# Patient Record
Sex: Male | Born: 1960 | Race: Black or African American | Hispanic: No | Smoking: Former smoker
Health system: Southern US, Community
[De-identification: ages and names within clinical notes are randomized; demographics above are authoritative.]

## PROBLEM LIST (undated history)

## (undated) DIAGNOSIS — C61 Malignant neoplasm of prostate: Secondary | ICD-10-CM

## (undated) DIAGNOSIS — K859 Acute pancreatitis without necrosis or infection, unspecified: Secondary | ICD-10-CM

## (undated) DIAGNOSIS — C801 Malignant (primary) neoplasm, unspecified: Secondary | ICD-10-CM

## (undated) DIAGNOSIS — R945 Abnormal results of liver function studies: Secondary | ICD-10-CM

## (undated) DIAGNOSIS — K869 Disease of pancreas, unspecified: Secondary | ICD-10-CM

## (undated) DIAGNOSIS — M549 Dorsalgia, unspecified: Secondary | ICD-10-CM

## (undated) DIAGNOSIS — R7989 Other specified abnormal findings of blood chemistry: Secondary | ICD-10-CM

## (undated) DIAGNOSIS — I10 Essential (primary) hypertension: Secondary | ICD-10-CM

## (undated) DIAGNOSIS — K7460 Unspecified cirrhosis of liver: Secondary | ICD-10-CM

## (undated) DIAGNOSIS — G8929 Other chronic pain: Secondary | ICD-10-CM

## (undated) DIAGNOSIS — M25569 Pain in unspecified knee: Secondary | ICD-10-CM

## (undated) DIAGNOSIS — K760 Fatty (change of) liver, not elsewhere classified: Secondary | ICD-10-CM

## (undated) DIAGNOSIS — K449 Diaphragmatic hernia without obstruction or gangrene: Secondary | ICD-10-CM

## (undated) HISTORY — DX: Acute pancreatitis without necrosis or infection, unspecified: K85.90

## (undated) HISTORY — DX: Abnormal results of liver function studies: R94.5

## (undated) HISTORY — PX: KNEE SURGERY: SHX244

## (undated) HISTORY — DX: Other specified abnormal findings of blood chemistry: R79.89

## (undated) HISTORY — PX: PROSTATE BIOPSY: SHX241

## (undated) HISTORY — DX: Fatty (change of) liver, not elsewhere classified: K76.0

## (undated) HISTORY — DX: Disease of pancreas, unspecified: K86.9

## (undated) HISTORY — DX: Diaphragmatic hernia without obstruction or gangrene: K44.9

## (undated) HISTORY — DX: Unspecified cirrhosis of liver: K74.60

---

## 2001-02-13 ENCOUNTER — Emergency Department (HOSPITAL_COMMUNITY): Admission: EM | Admit: 2001-02-13 | Discharge: 2001-02-14 | Payer: Self-pay | Admitting: Emergency Medicine

## 2001-02-14 ENCOUNTER — Encounter: Payer: Self-pay | Admitting: Emergency Medicine

## 2001-02-21 ENCOUNTER — Encounter: Admission: RE | Admit: 2001-02-21 | Discharge: 2001-02-21 | Payer: Self-pay | Admitting: Nephrology

## 2001-02-21 ENCOUNTER — Encounter: Payer: Self-pay | Admitting: Nephrology

## 2001-09-04 ENCOUNTER — Encounter: Payer: Self-pay | Admitting: Emergency Medicine

## 2001-09-04 ENCOUNTER — Emergency Department (HOSPITAL_COMMUNITY): Admission: EM | Admit: 2001-09-04 | Discharge: 2001-09-04 | Payer: Self-pay | Admitting: Emergency Medicine

## 2001-12-18 ENCOUNTER — Ambulatory Visit (HOSPITAL_COMMUNITY): Admission: RE | Admit: 2001-12-18 | Discharge: 2001-12-18 | Payer: Self-pay | Admitting: Family Medicine

## 2002-09-27 ENCOUNTER — Ambulatory Visit (HOSPITAL_COMMUNITY): Admission: RE | Admit: 2002-09-27 | Discharge: 2002-09-27 | Payer: Self-pay | Admitting: Cardiovascular Disease

## 2002-09-27 ENCOUNTER — Encounter: Payer: Self-pay | Admitting: Cardiovascular Disease

## 2003-01-16 ENCOUNTER — Ambulatory Visit (HOSPITAL_COMMUNITY): Admission: RE | Admit: 2003-01-16 | Discharge: 2003-01-16 | Payer: Self-pay | Admitting: Family Medicine

## 2003-07-17 ENCOUNTER — Emergency Department (HOSPITAL_COMMUNITY): Admission: EM | Admit: 2003-07-17 | Discharge: 2003-07-17 | Payer: Self-pay | Admitting: Emergency Medicine

## 2003-07-23 ENCOUNTER — Emergency Department (HOSPITAL_COMMUNITY): Admission: AD | Admit: 2003-07-23 | Discharge: 2003-07-23 | Payer: Self-pay | Admitting: Family Medicine

## 2004-06-15 ENCOUNTER — Ambulatory Visit (HOSPITAL_COMMUNITY): Admission: RE | Admit: 2004-06-15 | Discharge: 2004-06-15 | Payer: Self-pay | Admitting: Family Medicine

## 2004-07-06 ENCOUNTER — Ambulatory Visit (HOSPITAL_COMMUNITY): Admission: RE | Admit: 2004-07-06 | Discharge: 2004-07-06 | Payer: Self-pay | Admitting: Family Medicine

## 2004-08-03 ENCOUNTER — Ambulatory Visit: Payer: Self-pay | Admitting: Psychiatry

## 2004-08-03 ENCOUNTER — Inpatient Hospital Stay (HOSPITAL_COMMUNITY): Admission: RE | Admit: 2004-08-03 | Discharge: 2004-08-07 | Payer: Self-pay | Admitting: Psychiatry

## 2004-08-04 ENCOUNTER — Encounter (HOSPITAL_COMMUNITY): Payer: Self-pay | Admitting: Psychiatry

## 2004-09-22 ENCOUNTER — Ambulatory Visit: Payer: Self-pay | Admitting: Internal Medicine

## 2004-09-28 ENCOUNTER — Ambulatory Visit (HOSPITAL_COMMUNITY): Admission: RE | Admit: 2004-09-28 | Discharge: 2004-09-28 | Payer: Self-pay | Admitting: *Deleted

## 2004-11-09 ENCOUNTER — Ambulatory Visit: Payer: Self-pay | Admitting: Internal Medicine

## 2005-02-17 ENCOUNTER — Ambulatory Visit: Payer: Self-pay | Admitting: Internal Medicine

## 2005-04-15 ENCOUNTER — Ambulatory Visit: Payer: Self-pay | Admitting: Internal Medicine

## 2005-06-03 ENCOUNTER — Ambulatory Visit: Payer: Self-pay | Admitting: Internal Medicine

## 2009-12-16 ENCOUNTER — Emergency Department (HOSPITAL_COMMUNITY): Admission: EM | Admit: 2009-12-16 | Discharge: 2009-12-16 | Payer: Self-pay | Admitting: Emergency Medicine

## 2010-06-28 ENCOUNTER — Ambulatory Visit: Payer: Self-pay | Admitting: Family Medicine

## 2010-06-28 ENCOUNTER — Telehealth: Payer: Self-pay | Admitting: Family Medicine

## 2010-06-28 ENCOUNTER — Encounter: Payer: Self-pay | Admitting: Family Medicine

## 2010-06-28 DIAGNOSIS — M109 Gout, unspecified: Secondary | ICD-10-CM | POA: Insufficient documentation

## 2010-06-28 DIAGNOSIS — I10 Essential (primary) hypertension: Secondary | ICD-10-CM | POA: Insufficient documentation

## 2010-06-29 ENCOUNTER — Ambulatory Visit (HOSPITAL_COMMUNITY): Admission: RE | Admit: 2010-06-29 | Discharge: 2010-06-29 | Payer: Self-pay | Admitting: Family Medicine

## 2010-06-29 ENCOUNTER — Encounter: Payer: Self-pay | Admitting: Family Medicine

## 2010-06-29 ENCOUNTER — Ambulatory Visit: Payer: Self-pay | Admitting: Family Medicine

## 2010-06-29 DIAGNOSIS — E875 Hyperkalemia: Secondary | ICD-10-CM | POA: Insufficient documentation

## 2010-06-29 LAB — CONVERTED CEMR LAB
AST: 66 units/L — ABNORMAL HIGH (ref 0–37)
Albumin: 4.8 g/dL (ref 3.5–5.2)
Alkaline Phosphatase: 85 units/L (ref 39–117)
BUN: 10 mg/dL (ref 6–23)
Creatinine, Ser: 0.97 mg/dL (ref 0.40–1.50)
Potassium: 5.4 meq/L — ABNORMAL HIGH (ref 3.5–5.3)

## 2010-07-02 ENCOUNTER — Ambulatory Visit: Payer: Self-pay | Admitting: Family Medicine

## 2010-07-02 ENCOUNTER — Encounter: Payer: Self-pay | Admitting: Family Medicine

## 2010-07-02 LAB — CONVERTED CEMR LAB
BUN: 15 mg/dL (ref 6–23)
CO2: 21 meq/L (ref 19–32)
Chloride: 97 meq/L (ref 96–112)
Creatinine, Ser: 1 mg/dL (ref 0.40–1.50)
Glucose, Bld: 130 mg/dL — ABNORMAL HIGH (ref 70–99)

## 2010-07-07 ENCOUNTER — Ambulatory Visit: Payer: Self-pay | Admitting: Family Medicine

## 2010-07-07 ENCOUNTER — Encounter: Payer: Self-pay | Admitting: Family Medicine

## 2010-07-07 DIAGNOSIS — I4949 Other premature depolarization: Secondary | ICD-10-CM | POA: Insufficient documentation

## 2010-07-07 LAB — CONVERTED CEMR LAB
BUN: 15 mg/dL (ref 6–23)
Calcium: 10.4 mg/dL (ref 8.4–10.5)
Cholesterol: 179 mg/dL (ref 0–200)
Glucose, Bld: 78 mg/dL (ref 70–99)

## 2010-09-01 ENCOUNTER — Ambulatory Visit: Payer: Self-pay

## 2010-09-17 ENCOUNTER — Ambulatory Visit (HOSPITAL_COMMUNITY)
Admission: RE | Admit: 2010-09-17 | Discharge: 2010-09-17 | Payer: Self-pay | Source: Home / Self Care | Admitting: Family Medicine

## 2010-09-17 ENCOUNTER — Encounter: Payer: Self-pay | Admitting: Family Medicine

## 2010-09-17 ENCOUNTER — Other Ambulatory Visit: Payer: Self-pay

## 2010-09-17 ENCOUNTER — Ambulatory Visit: Admission: RE | Admit: 2010-09-17 | Discharge: 2010-09-17 | Payer: Self-pay | Source: Home / Self Care

## 2010-09-17 LAB — CONVERTED CEMR LAB
BUN: 8 mg/dL (ref 6–23)
Chloride: 99 meq/L (ref 96–112)
Glucose, Bld: 101 mg/dL — ABNORMAL HIGH (ref 70–99)
Potassium: 4 meq/L (ref 3.5–5.3)

## 2010-09-20 ENCOUNTER — Ambulatory Visit: Admit: 2010-09-20 | Payer: Self-pay

## 2010-10-05 NOTE — Assessment & Plan Note (Signed)
Summary: New Patient Visit: Hypertension and Gout   Vital Signs:  Patient profile:   50 year old male Height:      65 inches Weight:      128.5 pounds BMI:     21.46 Pulse rate:   90 / minute BP sitting:   190 / 120  (left arm) Cuff size:   regular  Vitals Entered By: Arlyss Repress CMA, (June 28, 2010 8:39 AM) CC: right big toe pain...every week. did not take HTN meds x 1 year, Hypertension Management Is Patient Diabetic? No Pain Assessment Patient in pain? yes     Location: right big toe Onset of pain  x 1 week   Primary Care Provider:  Doree Albee MD  CC:  right big toe pain...every week. did not take HTN meds x 1 year and Hypertension Management.  History of Present Illness: 37 YOM w/ PMHx/o gout hand hypertension here for new patient visit.  Gout flare: Pt reports gout flare for last 3-4 days. Most recent attack was approx 3 months ago. Has not been allopurinol in the past.  Rates pain 6/10. Usually affects R big toe. Has not had flares in other joints per pt.   Hypertension History:      He complains of headache, but denies chest pain, palpitations, dyspnea with exertion, orthopnea, PND, peripheral edema, visual symptoms, and syncope.  Further comments include: Was previously diagnosed with hypertension while in prison. Was on 2 medications-however unsure of whihc medications he was taking. has been on medication >1 year since getting out of prison. Marland Kitchen        Positive major cardiovascular risk factors include male age 24 years old or older, hypertension, and current tobacco user.     Habits & Providers  Alcohol-Tobacco-Diet     Tobacco Status: current     Tobacco Counseling: to quit use of tobacco products     Cigarette Packs/Day: 0.5  Medications Prior to Update: 1)  None  Current Medications (verified): 1)  Hydrochlorothiazide 25 Mg Tabs (Hydrochlorothiazide) .... Take 1 Tablet Daily 2)  Norvasc 10 Mg Tabs (Amlodipine Besylate) .... Take 1 Tablet Daily 3)   Colcrys 0.6 Mg Tabs (Colchicine) .... Take 2 Tabs Once, Then 1 Tablet 1 Hour Later; Then Take 1 Tablet Daily Therafter 4)  Prednisone 20 Mg Tabs (Prednisone) .... Take 2 Tablets Daily For 5 Days  Family History: Family History Diabetes 1st degree relative Family History Hypertension  Social History: Pt lives alone Currently unemployed, on disability Was previously in prison. Released in 2009. Drinks 2 beers per day Smokes approx 1/2 ppd  No recreational drug use Smoking Status:  current Packs/Day:  0.5  Physical Exam  General:  alert and well-developed.   Head:  normocephalic and atraumatic.   Eyes:  vision grossly intact, pupils equal, pupils round, and pupils reactive to light.  No abnormalities on funduscopic exam  Ears:  TMs clear bilaterally  Nose:  no external deformity.   Mouth:  Oral mucosa and oropharynx without lesions or exudates.   Neck:  supple and full ROM, No JVD  Chest Wall:  no deformities.   Lungs:  CTAB, no wheezes, rales, rhoncii Heart:  RRR, no rubs, gallops, murmurs Abdomen:  soft, non-tender, normal bowel sounds, and no distention.   Extremities:  2+ peripheral pulses, no edema Neurologic:  alert & oriented X3 and cranial nerves II-XII intact.   Psych:  Oriented X3.     Impression & Recommendations:  Problem # 1:  HYPERTENSION,  BENIGN ESSENTIAL (ICD-401.1) Plan to treat with norvasc and HCTZ. Will hold on starting ACEi given SBP and concern for renal impairment. Pt instructed to followup in 1-2 days for followup BP check. Currently asymptomatic. Willl followup in 1-2 weeks pending [place]/Medicare set up as pt curently uninisured.  His updated medication list for this problem includes:    Hydrochlorothiazide 25 Mg Tabs (Hydrochlorothiazide) .Marland Kitchen... Take 1 tablet daily    Norvasc 10 Mg Tabs (Amlodipine besylate) .Marland Kitchen... Take 1 tablet daily  Orders: Comp Met-[facility] ([id number]) [facility]- Est Level  3 (51884)  Problem # 2:  ACUTE GOUTY ARTHROPATHY  (ICD-274.01) Plan to treat with colchincine and prednisone. Wil hold on high dose NSAIDs secondary to elevated BPs and concern for renal impairment.  Will likely transition to allopurinol and obtain baseline uric acid level once acute gout flare resolved.  His updated medication list for this problem includes:    Colcrys 0.6 Mg Tabs (Colchicine) .Marland Kitchen... Take 2 tabs once, then 1 tablet 1 hour later; then take 1 tablet daily therafter  Orders: [facility]- Est Level  3 ([address]606)  Complete Medication List: 1)  Hydrochlorothiazide 25 Mg Tabs (Hydrochlorothiazide) .... Take 1 tablet daily 2)  Norvasc 10 Mg Tabs (Amlodipine besylate) .... Take 1 tablet daily 3)  Colcrys 0.6 Mg Tabs (Colchicine) .... Take 2 tabs once, then 1 tablet 1 hour later; then take 1 tablet daily therafter 4)  Prednisone 20 Mg Tabs (Prednisone) .... Take 2 tablets daily for 5 days  Hypertension Assessment/Plan:      The patient's hypertensive risk group is category B: At least one risk factor (excluding diabetes) with no target organ damage.  Today's blood pressure is 190/120.  His blood pressure goal is < 140/90.  Patient Instructions: 1)  It was good to meet you today  2)  We are going to check some labs because of your blood pressure 3)  I will be prescribing you some medications to bring your blood pressure down (Norvasc and HCTZ) 4)  I will also  prescribe you some medication for your gout 5)  Come back later this week for a blood pressure check  6)  Come back to see as soon as  your insurance is set up with either Jaynee Eagles or Medicare 7)  IIf any other questions, please, give [place] a call 8)  God Bless 9)  Doree Albee MD  Prescriptions: PREDNISONE 20 MG TABS (PREDNISONE) take 2 tablets daily for 5 days  #10 x 0   Entered and Authorized by:   Doree Albee MD   Signed by:   Doree Albee MD on 06/28/2010   Method used:   Print then Give to Patient   RxID:   [id number] COLCRYS 0.6 MG TABS (COLCHICINE) take 2 tabs  once, then 1 tablet 1 hour later; Then take 1 tablet daily therafter  #33 x 1   Entered and Authorized by:   Doree Albee MD   Signed by:   Doree Albee MD on 06/28/2010   Method used:   Print then Give to Patient   RxID:   [id number] NORVASC 10 MG TABS (AMLODIPINE BESYLATE) take 1 tablet daily  #30 x 0   Entered and Authorized by:   Doree Albee MD   Signed by:   Doree Albee MD on 06/28/2010   Method used:   Print then Give to Patient   RxID:   [id number] HYDROCHLOROTHIAZIDE 25 MG TABS (HYDROCHLOROTHIAZIDE) take 1 tablet daily  #30 x 1   Entered  and Authorized by:   Doree Albee MD   Signed by:   Doree Albee MD on 06/28/2010   Method used:   Print then Give to Patient   RxID:   [id number]    Orders Added: 1)  Comp Met-[facility] [[id number]] 2)  [facility]- Est Level  3 [32355]

## 2010-10-05 NOTE — Assessment & Plan Note (Signed)
Summary: Hypertension and Gout Followup   Vital Signs:  Patient profile:   50 year old male Weight:      125.5 pounds BMI:     20.96 Temp:     98.1 degrees F Pulse rate:   91 / minute BP sitting:   132 / 84  (left arm)  Vitals Entered By: Starleen Blue RN (July 07, 2010 8:58 AM) CC: fu bp, Hypertension Management Is Patient Diabetic? No Pain Assessment Patient in pain? no        Primary Care Provider:  Doree Albee MD  CC:  fu bp and Hypertension Management.  History of Present Illness: Gout: R great toe pain resolved per pt. ROM and overall function now at baseline. Tolerated prednisone well without gastritis, or other systemtic sxs.   Hypertension History:      He complains of headache, but denies chest pain, palpitations, dyspnea with exertion, orthopnea, peripheral edema, and visual symptoms.        Positive major cardiovascular risk factors include male age 75 years old or older, hypertension, and current tobacco user.     Habits & Providers  Alcohol-Tobacco-Diet     Tobacco Status: current     Cigarette Packs/Day: 0.5  Allergies (verified): 1)  ! Pcn  Physical Exam  General:  alert and well-developed.   Head:  normocephalic and atraumatic.   Eyes:  vision grossly intact.   Neck:  supple and full ROM, No JVD  Lungs:  CTAB, no wheezes, rales, rhoncii Heart:  RRR, no rubs, gallops, murmurs Abdomen:  soft, non-tender, normal bowel sounds, and no distention.   Msk:  R great toe: non-erythematous, no tenderness to palpation  Extremities:  2+ peripheral pulses, no edema   Impression & Recommendations:  Problem # 1:  ACUTE GOUTY ARTHROPATHY (ICD-274.01) Assessment Improved Now resolved. Will start pt on allopurinol for chronic management.  His updated medication list for this problem includes:    Colcrys 0.6 Mg Tabs (Colchicine) .Marland Kitchen... Take 2 tabs once, then 1 tablet 1 hour later; then take 1 tablet daily therafter    Allopurinol 100 Mg Tabs  (Allopurinol) .Marland Kitchen... 1 tablet daily for 7 days; then 1 tablet twice daily thereafter  Orders: [facility]- Est Level  3 ([address])  Problem # 2:  HYPERTENSION, BENIGN ESSENTIAL (ICD-401.1) Assessment: Improved BPs now at goal. Will transition pt to losartan from HCTZ for uricosuric properties in setting of underlying gout. Will also start on  low dose beta  blocker secondary to noted PVCs on most recent EKG. Discussed at length with pt hypotensive red flags. Pt instructed for repeat BP check in  1 week.   The following medications were removed from the medication list:    Hydrochlorothiazide 25 Mg Tabs (Hydrochlorothiazide) .Marland Kitchen... Take 1 tablet daily His updated medication list for this problem includes:    Norvasc 10 Mg Tabs (Amlodipine besylate) .Marland Kitchen... Take 1 tablet daily    Losartan Potassium 25 Mg Tabs (Losartan potassium) .Marland Kitchen... Take 2 tablets daily    Coreg 6.25 Mg Tabs (Carvedilol) .Marland Kitchen... Take 1/2 tablet daily  Orders: T-Lipid Profile ([id number]) [facility]- Est Level  3 (78469)  Problem # 3:  PREMATURE VENTRICULAR CONTRACTIONS (ICD-427.69) Currently asympromatic. WIll start on low dose beta blocker for treatment.  His updated medication list for this problem includes:    Coreg 6.25 Mg Tabs (Carvedilol) .Marland Kitchen... Take 1/2 tablet daily  Problem # 4:  HYPERKALEMIA (ICD-276.7) Asymptomatic. Will check BMET for resolution.  Orders: Basic Met-[facility] ([phone]) [facility]- Est Level  3 (04540)  Complete Medication List: 1)  Norvasc 10 Mg Tabs (Amlodipine besylate) .... Take 1 tablet daily 2)  Colcrys 0.6 Mg Tabs (Colchicine) .... Take 2 tabs once, then 1 tablet 1 hour later; then take 1 tablet daily therafter 3)  Losartan Potassium 25 Mg Tabs (Losartan potassium) .... Take 2 tablets daily 4)  Allopurinol 100 Mg Tabs (Allopurinol) .Marland Kitchen.. 1 tablet daily for 7 days; then 1 tablet twice daily thereafter 5)  Coreg 6.25 Mg Tabs (Carvedilol) .... Take 1/2 tablet daily  Other Orders: Misc. Referral (Misc.  Ref)  Hypertension Assessment/Plan:      The patient's hypertensive risk group is category B: At least one risk factor (excluding diabetes) with no target organ damage.  Today's blood pressure is 132/84.  His blood pressure goal is < 140/90.  Patient Instructions: 1)  It was good to see you today 2)  Your blood pressure and gout are doing much better 3)  I will change some of your blood pressure medications to help with your gout 4)  STOP taking the HCTZ and colchicine 5)  START losartan and allopurinol 6)  I will also recheck some labs today  7)  Come back in 1 week for blood pressure check  8)  Otherwise, come back to see me in 2-4 weeks.  9)  Call with any questions 10)  God Bless 11)  Doree Albee MD Prescriptions: COREG 6.25 MG TABS (CARVEDILOL) take 1/2 tablet daily  #30 x 6   Entered and Authorized by:   Doree Albee MD   Signed by:   Doree Albee MD on 07/07/2010   Method used:   Electronically to        [facility] [address] [phone]* (retail)       [address]       [place], [place]  [address]       Ph: [phone]       Fax: [phone]   RxID:   [id number] ALLOPURINOL 100 MG TABS (ALLOPURINOL) 1 tablet daily for 7 days; then 1 tablet twice daily thereafter  #60 x 3   Entered and Authorized by:   Doree Albee MD   Signed by:   Doree Albee MD on 07/07/2010   Method used:   Electronically to        [facility] [address] [phone]* (retail)       [address]       [place], [place]  [address]       Ph: [phone]       Fax: [phone]   RxID:   [id number] DDUKGURK POTASSIUM 25 MG TABS (LOSARTAN POTASSIUM) take 2 tablets daily  #60 x 6   Entered and Authorized by:   Doree Albee MD   Signed by:   Doree Albee MD on 07/07/2010   Method used:   Electronically to        [facility] [address] [phone]* (retail)       [address]       [place], [place]  [address]       Ph: [phone]       Fax: [phone]   RxID:   [id number]    Orders  Added: 1)  Basic Met-[facility] [[id number]] 2)  Misc. Referral [Misc. Ref] 3)  T-Lipid Profile [80061-22930] 4)  [facility]- Est Level  3 [93810]     Prevention & Chronic Care Immunizations   Influenza vaccine: Not documented    Tetanus booster: Not documented  Td booster deferral: Deferred  (07/07/2010)   Tetanus booster due: 08/23/2011    Pneumococcal vaccine: Not documented  Colorectal Screening   Hemoccult: Not documented    Colonoscopy: Not documented  Other Screening   PSA: Not documented   Smoking status: current  (07/07/2010)   Smoking cessation counseling: yes  (07/07/2010)  Lipids   Total Cholesterol: Not documented   Lipid panel action/deferral: Lipid Panel ordered   LDL: Not documented   LDL Direct: Not documented   HDL: Not documented   Triglycerides: Not documented  Hypertension   Last Blood Pressure: 132 / 84  (07/07/2010)   Serum creatinine: 1.00  (07/02/2010)   Serum potassium 4.9  (07/02/2010)    Hypertension flowsheet reviewed?: Yes   Progress toward BP goal: At goal  Self-Management Support :   Personal Goals (by the next clinic visit) :      Personal blood pressure goal: 140/90  (07/07/2010)   Patient will work on the following items until the next clinic visit to reach self-care goals:     Medications and monitoring: check my blood pressure, bring all of my medications to every visit  (07/07/2010)     Eating: eat more vegetables, eat foods that are low in salt  (07/07/2010)     Other: attend a smoking cessation class  (07/07/2010)    Hypertension self-management support: BP self-monitoring log, Education handout, Written self-care plan  (07/07/2010)   Hypertension self-care plan printed.   Hypertension education handout printed   Nursing Instructions: Give Flu vaccine today

## 2010-10-05 NOTE — Assessment & Plan Note (Signed)
Summary: labs & bp check/eo  Nurse Visit In for BP check today and labs, BP checked manually with regular adult cuff. BP  RA 140/90, LA 140/94. pulse 88. will forward message to Dr. Alvester Morin.   Theresia Lo RN  July 02, 2010 4:40 PM   Orders Added: 1)  No Charge Patient Arrived (NCPA0) [NCPA0]

## 2010-10-05 NOTE — Assessment & Plan Note (Signed)
Summary: BP CHECK/KH  Nurse Visit  patient in for BP check and EKG as instructed by Dr. Alvester Morin.  BP checked manually with regular adult cuff. BP LA 150/90 , RA 142/90 pulse 100.  patient took amlopidine and HCTZ yesterday and today as directed. EKG done and shown to Dr. Deirdre Priest. he advises for patient to return 07/02/2010 for BMET and BP check again and see Dr. Alvester Morin next week. appointment scheduled for 07/07/2010. Theresia Lo RN  June 29, 2010 2:44 PM   Orders Added: 1)  Basic Met-[facility] [id number] 2)  Uric Acid-[facility] [09811-91478] 3)  No Charge Patient Arrived (NCPA0) [NCPA0]

## 2010-10-05 NOTE — Progress Notes (Signed)
Summary: pls call  Phone Note Call from Patient Call back at [phone]   Caller: Patient Summary of Call: has a question about meds that were given to him Initial call taken by: De Nurse,  June 28, 2010 11:05 AM  Follow-up for Phone Call        Call and spoke to patient about concern of cost of colchicine. Told patient that he may be able to tolerate prednisone only for the acute flare given the cost issue. Will also recommend NSAIDs pending BMET in setting of SBPs>180 on  initial visit (to avoid further renal insult). Pt agreeable to plan. Doree Albee MD June 28, 2010 5:08 PM

## 2010-10-07 ENCOUNTER — Ambulatory Visit: Payer: Self-pay | Admitting: Family Medicine

## 2010-10-07 ENCOUNTER — Ambulatory Visit: Admit: 2010-10-07 | Payer: Self-pay

## 2010-10-07 NOTE — Assessment & Plan Note (Signed)
Summary: f/u eo   Vital Signs:  Patient profile:   50 year old male Height:      65 inches Weight:      131 pounds BMI:     21.88 Temp:     98.1 degrees F oral Pulse rate:   97 / minute BP sitting:   176 / 98  (left arm) Cuff size:   regular  Vitals Entered By: Tessie Fass CMA (September 17, 2010 1:44 PM) CC: F/U BP, Hypertension Management Is Patient Diabetic? No Pain Assessment Patient in pain? no        CC:  F/U BP and Hypertension Management.  Hypertension History:      He complains of headache and chest pain, but denies palpitations, dyspnea with exertion, orthopnea, PND, peripheral edema, and visual symptoms.  Further comments include: CP intermittent not associated with exertion. Currently no CP. Marland Kitchen        Positive major cardiovascular risk factors include male age 29 years old or older, hypertension, and current tobacco user.     Habits & Providers  Alcohol-Tobacco-Diet     Tobacco Status: current     Tobacco Counseling: to quit use of tobacco products     Cigarette Packs/Day: 0.5  Allergies: 1)  ! Pcn  Physical Exam  General:  alert and well-developed.   Head:  normocephalic and atraumatic.   Mouth:  good dentition.   Neck:  supple, full ROM, no JVD Lungs:  CTAB, no wheezes, rales rhoncii  Heart:  RRR, no rubs, gallops Extremities:  2+ peripheral pulses, no edema    Impression & Recommendations:  Problem # 1:  HYPERTENSION, BENIGN ESSENTIAL (ICD-401.1) Assessment Deteriorated Elevated today. EKG in clinic negative for any concerning ST/T wave changes. Will increase coreg. Will switch to losartan for BP and gout management.  WIl lhave pt followup for BP check in 1 week.  His updated medication list for this problem includes:    Coreg 12.5 Mg Tabs (Carvedilol) .Marland Kitchen... 1 tablet twice daily    Cozaar 50 Mg Tabs (Losartan potassium) .Marland Kitchen... 1 tab two times a day  Orders: T-Basic Metabolic Panel [phone]) 12 Lead EKG (12 Lead EKG) [facility]- Est Level  3  ([address]11)  Problem # 2:  ACUTE GOUTY ARTHROPATHY (ICD-274.01)  Pt instructed to restart allopurinol. Will recheck uric acid once on allopurinol.  His updated medication list for this problem includes:    Allopurinol 100 Mg Tabs (Allopurinol) .Marland Kitchen... 1 pill twice daily  Orders: [facility]- Est Level  3 (99213)  Complete Medication List: 1)  Coreg 12.5 Mg Tabs (Carvedilol) .Marland Kitchen.. 1 tablet twice daily 2)  Cozaar 50 Mg Tabs (Losartan potassium) .Marland Kitchen.. 1 tab two times a day 3)  Allopurinol 100 Mg Tabs (Allopurinol) .Marland Kitchen.. 1 pill twice daily 4)  Ra Nicotine 14 Mg/24hr Pt24 (Nicotine) .Marland Kitchen.. 1 patch every 24 hours for 6  Other Orders: Colonoscopy (Colon)  Hypertension Assessment/Plan:      The patient's hypertensive risk group is category B: At least one risk factor (excluding diabetes) with no target organ damage.  His calculated 10 year risk of coronary heart disease is 9 %.  Today's blood pressure is 176/98.  His blood pressure goal is < 140/90.  Patient Instructions: 1)  It was good to see you today 2)  I am increasing your coreg to 12.5 mg twice daily (2 of your 6.25 mg tablets twice a day) 3)  I am also check some blood work today 4)  Come back on 09/20/10 for  a blood presure check  5)  Restart your allopurinol 6)  Come back in 2 weeks for [place] to follow up on your blood pressure 7)  If you develop any sever chest pain, please give [place] a call or go to the Emergency Department 8)  God Bless,  9)  Doree Albee MD  Prescriptions: RA NICOTINE 14 MG/24HR PT24 (NICOTINE) 1 patch every 24 hours for 6  #42 patchs x 0   Entered and Authorized by:   Doree Albee MD   Signed by:   Doree Albee MD on 09/17/2010   Method used:   Print then Give to Patient   RxID:   [id number] ALLOPURINOL 100 MG TABS (ALLOPURINOL) 1 pill twice daily  #60 x 0   Entered and Authorized by:   Doree Albee MD   Signed by:   Doree Albee MD on 09/17/2010   Method used:   Historical   RxID:   [id number] COZAAR 50 MG  TABS (LOSARTAN POTASSIUM) 1 tab two times a day  #60 x 0   Entered and Authorized by:   Doree Albee MD   Signed by:   Doree Albee MD on 09/17/2010   Method used:   Historical   RxID:   [id number] COREG 12.5 MG TABS (CARVEDILOL) 1 tablet twice daily  #60 x 3   Entered and Authorized by:   Doree Albee MD   Signed by:   Doree Albee MD on 09/17/2010   Method used:   Print then Give to Patient   RxID:   [id number]    Orders Added: 1)  Colonoscopy [Colon] 2)  T-Basic Metabolic Panel [[id number]] 3)  12 Lead EKG [12 Lead EKG] 4)  [facility]- Est Level  3 [51884]     Prevention & Chronic Care Immunizations   Influenza vaccine: Not documented    Tetanus booster: Not documented   Td booster deferral: Deferred  (07/07/2010)   Tetanus booster due: 08/23/2011    Pneumococcal vaccine: Not documented  Colorectal Screening   Hemoccult: Not documented    Colonoscopy: Not documented   Colonoscopy action/deferral: GI Referral  (09/17/2010)  Other Screening   PSA: Not documented   Smoking status: current  (09/17/2010)   Smoking cessation counseling: yes  (07/07/2010)  Lipids   Total Cholesterol: 179  (07/07/2010)   Lipid panel action/deferral: Lipid Panel ordered   LDL: 81  (07/07/2010)   LDL Direct: Not documented   HDL: 75  (07/07/2010)   Triglycerides: 117  (07/07/2010)  Hypertension   Last Blood Pressure: 176 / 98  (09/17/2010)   Serum creatinine: 0.94  (07/07/2010)   BMP action: Ordered   Serum potassium 3.9  (07/07/2010)    Hypertension flowsheet reviewed?: Yes   Progress toward BP goal: Deteriorated  Self-Management Support :   Personal Goals (by the next clinic visit) :      Personal blood pressure goal: 140/90  (07/07/2010)   Hypertension self-management support: BP self-monitoring log, Education handout, Written self-care plan  (07/07/2010)   Nursing Instructions: Give Flu vaccine today Screening colonoscopy ordered

## 2010-10-17 ENCOUNTER — Emergency Department (HOSPITAL_COMMUNITY)
Admission: EM | Admit: 2010-10-17 | Discharge: 2010-10-17 | Disposition: A | Discharge status: Home / Self Care | Payer: Medicare Other | Attending: Emergency Medicine | Admitting: Emergency Medicine

## 2010-10-17 DIAGNOSIS — I10 Essential (primary) hypertension: Secondary | ICD-10-CM | POA: Insufficient documentation

## 2010-10-17 DIAGNOSIS — M109 Gout, unspecified: Secondary | ICD-10-CM | POA: Insufficient documentation

## 2010-10-17 DIAGNOSIS — R1110 Vomiting, unspecified: Secondary | ICD-10-CM | POA: Insufficient documentation

## 2010-10-17 DIAGNOSIS — R109 Unspecified abdominal pain: Secondary | ICD-10-CM | POA: Insufficient documentation

## 2010-10-17 DIAGNOSIS — R197 Diarrhea, unspecified: Secondary | ICD-10-CM | POA: Insufficient documentation

## 2010-10-17 LAB — DIFFERENTIAL
Basophils Relative: 0 % (ref 0–1)
Eosinophils Absolute: 0.2 10*3/uL (ref 0.0–0.7)
Lymphs Abs: 1.5 10*3/uL (ref 0.7–4.0)
Monocytes Absolute: 0.3 10*3/uL (ref 0.1–1.0)
Monocytes Relative: 7 % (ref 3–12)
Neutro Abs: 2.6 10*3/uL (ref 1.7–7.7)

## 2010-10-17 LAB — COMPREHENSIVE METABOLIC PANEL
AST: 359 U/L — ABNORMAL HIGH (ref 0–37)
Albumin: 4.4 g/dL (ref 3.5–5.2)
Calcium: 9.9 mg/dL (ref 8.4–10.5)
Creatinine, Ser: 0.88 mg/dL (ref 0.4–1.5)
GFR calc Af Amer: >60 mL/min (ref >60)
GFR calc non Af Amer: >60 mL/min (ref >60)

## 2010-10-17 LAB — CBC
Hemoglobin: 12.9 g/dL — ABNORMAL LOW (ref 13.0–17.0)
MCH: 34 pg (ref 26.0–34.0)
MCHC: 35.2 g/dL (ref 30.0–36.0)
MCV: 96.6 fL (ref 78.0–100.0)

## 2010-10-20 ENCOUNTER — Encounter: Payer: Self-pay | Admitting: Family Medicine

## 2010-10-20 ENCOUNTER — Ambulatory Visit (INDEPENDENT_AMBULATORY_CARE_PROVIDER_SITE_OTHER): Payer: Medicare Other | Admitting: Family Medicine

## 2010-10-20 DIAGNOSIS — M109 Gout, unspecified: Secondary | ICD-10-CM

## 2010-10-20 DIAGNOSIS — I10 Essential (primary) hypertension: Secondary | ICD-10-CM

## 2010-10-20 DIAGNOSIS — K5289 Other specified noninfective gastroenteritis and colitis: Secondary | ICD-10-CM

## 2010-10-20 DIAGNOSIS — K529 Noninfective gastroenteritis and colitis, unspecified: Secondary | ICD-10-CM | POA: Insufficient documentation

## 2010-10-20 DIAGNOSIS — Z23 Encounter for immunization: Secondary | ICD-10-CM

## 2010-10-20 LAB — COMPREHENSIVE METABOLIC PANEL
ALT: 91 U/L — ABNORMAL HIGH (ref 0–53)
Albumin: 4.9 g/dL (ref 3.5–5.2)
BUN: 10 mg/dL (ref 6–23)
CO2: 22 mEq/L (ref 19–32)
Calcium: 9.8 mg/dL (ref 8.4–10.5)
Chloride: 101 mEq/L (ref 96–112)
Creat: 1.02 mg/dL (ref 0.40–1.50)

## 2010-10-20 LAB — URIC ACID: Uric Acid, Serum: 4.2 mg/dL (ref 4.0–7.8)

## 2010-10-20 MED ORDER — LOSARTAN POTASSIUM 50 MG PO TABS: 50.0000 mg | ORAL_TABLET | Freq: Every day | ORAL | Status: DC

## 2010-10-20 MED ORDER — CARVEDILOL 12.5 MG PO TABS: 25.0000 mg | ORAL_TABLET | Freq: Two times a day (BID) | ORAL | Status: DC

## 2010-10-20 MED ORDER — ASPIRIN EC 81 MG PO TBEC: 81.0000 mg | DELAYED_RELEASE_TABLET | Freq: Every day | ORAL | Status: AC

## 2010-10-20 MED ORDER — ALLOPURINOL 100 MG PO TABS: 100.0000 mg | ORAL_TABLET | Freq: Two times a day (BID) | ORAL | Status: DC

## 2010-10-20 NOTE — Assessment & Plan Note (Addendum)
Currently stable taking allopurinol 100 mg daily and cozaar. Will check uric acid level today.  Pt instructed to HOLD colchicine given recent GI episode (increased risk of GI symptoms and elevated LFTs with medication).

## 2010-10-20 NOTE — Patient Instructions (Signed)
It was good to see you today I am increasing your coreg for your blood pressure Start taking a baby aspirin everyday STOP USING: Colchicine   Metoclopramide and lomotil for your recent episode of gastroenteritis.  I am also checking some labs today. I will mail you a result letter of your lab findings If you develop any chest pain, shortness of breath, palpitaions, weakness, or any other concerning symptoms, please give [place] a call.  Come back to see me in 1-2 months.  God Bless

## 2010-10-20 NOTE — Assessment & Plan Note (Addendum)
Clinically resolved. ? Viral source vs. Food induced vs. Medication. Pt instructed to d/c reglan and lomotil. Pt also instructed to hold colchicine as side effect profile consistent with gastroenteritic symptoms. Will recheck CMET to look at LFTs as they were elevated in ED. Will then recheck at follow up visit. May consider hepatitis panel in follow up visit given + RFs (inprisonment) if LFTs persistently elevated despite hold colchicine.

## 2010-10-20 NOTE — Assessment & Plan Note (Addendum)
K in ED 2/12 WNL. Will recheck K today.

## 2010-10-20 NOTE — Progress Notes (Signed)
  Subjective:    Patient ID: Adam Marshall, male    DOB: 1961-07-18, 50 y.o.   MRN: [id number]  Hypertension This is a chronic problem. The current episode started more than 1 year ago. The problem has been gradually improving since onset. The problem is controlled. Pertinent negatives include no anxiety, blurred vision, chest pain, headaches, palpitations, peripheral edema or shortness of breath. There are no associated agents to hypertension. Risk factors for coronary artery disease include male gender and smoking/tobacco exposure. Past treatments include beta blockers and angiotensin blockers. The current treatment provides moderate improvement. Compliance problems: reports trying to eat lower sodium diet.  There is no history of kidney disease, CAD/MI, heart failure, left ventricular hypertrophy or retinopathy.    Gastroenteritis: Pt reports recent episode of gastroenteritis over the weekend. Feels that it may be associated with something that he and his girfriend ate. Symptoms including nausea, vomiting (NBNB), dairrhea (non bloody) were present 2-3 days prior to being evaluated in ED 10/17/10. symptoms were inermittent in nature throughout course.  Pt reports girlfriend with similar symptoms over the same time frame. Pt was evaluated in ED 10/17/10 and placed on reglan and lomotil at time of discharge. Pt reports resolution of symptoms since late PM 2/13. Pt has been also taking daily colchicine.    Gout: no gout flares for > 2-3 months per pt. Is now taking allopurinol 100 mg bid and colchicine 0.6 mg daily. Symptoms usually in 1st left MTP  Whenever flares occur.    Review of Systems  Eyes: Negative for blurred vision.  Respiratory: Negative for shortness of breath.   Cardiovascular: Negative.  Negative for chest pain and palpitations.  Neurological: Negative for headaches.       Objective:   Physical Exam  Constitutional: He appears well-developed and well-nourished.  HENT:  Head:  Normocephalic and atraumatic.  Eyes: Pupils are equal, round, and reactive to light.  Neck: Normal range of motion. Neck supple.  Cardiovascular: Normal rate and regular rhythm.   Pulmonary/Chest: Effort normal and breath sounds normal.  Abdominal: Soft. Bowel sounds are normal.  Musculoskeletal: Normal range of motion. He exhibits no edema.  Skin: Skin is warm.          Assessment & Plan:

## 2010-10-20 NOTE — Assessment & Plan Note (Addendum)
BPs improved with current regimen. Still with high heart rate in the 90s, SBPs in the upper 130s/near 140s . Will increase coreg to 25 mg bid. Will continue with losartan at current dosing regimen. Will also check Cr and K. Also advised to restart baby aspirin.

## 2010-10-21 ENCOUNTER — Encounter: Payer: Self-pay | Admitting: Family Medicine

## 2010-10-25 ENCOUNTER — Other Ambulatory Visit: Payer: Self-pay | Admitting: Family Medicine

## 2010-10-25 NOTE — Telephone Encounter (Signed)
Refill request

## 2010-11-08 ENCOUNTER — Other Ambulatory Visit: Payer: Self-pay | Admitting: Family Medicine

## 2010-11-09 NOTE — Telephone Encounter (Signed)
Refill request

## 2011-01-21 NOTE — H&P (Signed)
NAME:  Adam Marshall, Adam Marshall NO.:  [id number]   MEDICAL RECORD NO.:  [id number]          PATIENT TYPE:  IPS   LOCATION:  0506                          [facility]ACILITY[facility]  [facility]   PHYSICIAN:  Jeanice Lim, M.D. DATE OF BIRTH:  1960-12-01   DATE OF ADMISSION:  08/03/2004  DATE OF DISCHARGE:                         PSYCHIATRIC ADMISSION ASSESSMENT   IDENTIFYING INFORMATION:  This is a 50 year old, divorced, African-American  male, voluntarily admitted on August 03, 2004.   HISTORY OF PRESENT ILLNESS:  The patient presents with a history of alcohol  abuse.  Has been drinking liquor, a fifth over a two day period.  Also he  states that he normally binge drinks, but his drinking has been escalating.  He has lost about 20 pounds this last year.  Saturday patient tried to stop  drinking on his own.  He states that he is in general just tired of his  drinking and he had a seizure at home and was transferred to the emergency  department.  The patient sustained a head injury.  He hit his head on the  floor while he was at home.  Also reports hitting his head prior and states  that he had a seizure at that time.  Patient reports that he is not sleeping  well.  He feels very depressed, but denied any suicidal or homicidal  thoughts.   PAST PSYCHIATRIC HISTORY:  First admission to [facility].  He  was at [facility] in the 1990s for alcohol abuse.  No outpatient  treatment.  He has been detoxed twice before.  Longest history of sobriety  has been nine months and that was in the 1990s.   SOCIAL HISTORY:  This is a 50 year old, divorced, African-American male.  Divorced since 6334; has a 35 year old child; lives with his fiancee.  He is  on disability for medical problems.   FAMILY HISTORY:  He has a sister who has a problem with substance abuse.   ALCOHOL/DRUG HISTORY:  The patient smokes.  He has been drinking in the  morning with blackouts and seizures.  Has been  drinking for years.  Denies  any drug use.   PRIMARY CARE Jaylen Knope:  Dr. Parke Simmers.   PAST MEDICAL HISTORY:  1.  History of gout.  2.  Back pain.  3.  Hypertension.  4.  Reports two seizures in the past with one on Saturday prior to this      admission.   MEDICATIONS:  The patient is on Norvasc 10 mg daily.   DRUG ALLERGIES:  PENICILLIN.   PHYSICAL EXAMINATION:  The patient was assessed at [facility] where he received folic acid, magnesium 2 g and potassium  chloride 40 mEq.  Temperature is 98; 72 heart rate; respiratory rate is 20;  blood pressure is 120/80; 118 pounds; 5 feet 5 inches tall.  RBCs 3.97,  potassium is 3, BUN is 1, chloride 93.  Urine drug screen is negative.  Alcohol level is 6.   MENTAL STATUS EXAM:  He is alert and in the bed, cooperative.  Fair eye  contact.  Speech is  clear.  He is drowsy.  He is also pleasant and sleepy.  Thought processes are coherent.  Cognitive function intact.  Memory is fair.  Judgment and insight are fair.   ADMISSION DIAGNOSES:   AXIS I:  Alcohol dependence.   AXIS II:  Deferred.   AXIS III:  1.  Gout.  2.  Hypertension.  3.  Seizure disorder, per patient.   AXIS IV:  Medical problems, psychosocial problems related to alcohol use.   AXIS V:  Current is 35; past year is 51.   PLAN:  Detox the patient safely with seizure precautions, stabilize mood and  thinking.  We will order a CAT scan to rule out any problems with bleed or  other significant problems.  We will check labs with low platelet count and  low potassium, encourage fluids, work on relapse while patient is here.  The  patient is to follow-up with AA to remain alcohol free.   TENTATIVE LENGTH OF STAY:  Five to six days.     Jani   JO/MEDQ  D:  08/04/2004  T:  08/04/2004  Job:  [id number]

## 2011-01-21 NOTE — Cardiovascular Report (Signed)
NAME:  Adam Marshall, ADDO NO.:  [id number]   MEDICAL RECORD NO.:  [id number]                   PATIENT TYPE:  OIB   LOCATION:  2899                                 FACILITY:  [facility]   PHYSICIAN:  Nanetta Batty, M.D.                DATE OF BIRTH:  [birth year]   DATE OF PROCEDURE:  DATE OF DISCHARGE:                              CARDIAC CATHETERIZATION   PROCEDURE:  Cardiac catheterization.   CARDIOLOGIST:  Nanetta Batty, M.D.   INDICATIONS FOR PROCEDURE:  The patient is a 50 year old divorced black male  with history of chest pain and positive risk factors for hypertension.  He  had negative functional study, but continued to have exertional chest pain.  Because of this he presents now for diagnostic coronary arteriography.   DESCRIPTION OF PROCEDURE:  The patient was brought to the 2nd floor [facility] cardiac cath lab in the postabsorptive state.  He was premedicated with  p.o. Valium and IV Versed.  His right groin was prepped and shaved in the  usual sterile fashion.  One percent Xylocaine was used for local anesthesia.  A 6 French sheath was inserted into the right femoral artery using the  standard Seldinger technique.  Six French right and left Judkins silastic  catheters along with a 6 French pigtail catheter were used for selective  coronary angiography and left ventriculography respectively.  Omnipaque dye  was used for the entirety of the case.  The aortic and left ventricular  pullback pressures were recorded.   Hemodynamics:  1. Aortic systolic pressure 126, diastolic pressure 83.  2. Left ventricular systolic pressure 125 and end diastolic pressure 3.   Selective Coronary Angiography:  1. Left main normal.   1. LAD:  The LAD gave off a moderate size diagonal branch, which bifurcated     and had minimal/noncritical disease.   1. Left circumflex:  Dominant and free of ischemic disease.   1. Right coronary artery:  Nondominant and  free of ischemic disease.   Left Ventriculography:  RAO left ventriculograms were performed with 25 mL  of Omnipaque at 12 mL/seconds.  The overall LVEF was estimated at greater  than 60% without left ventricular wall motion abnormalities.   IMPRESSION:  The patient has essentially normal coronary arteries with  normal left ventricular function.  I believe his chest pain is noncardiac  and most likely reflux-related.   The patient is to leave the compression on the groin to achieve hemostasis.  The patient left the lab in stable condition.  He will be discharged home  later today as an outpatient and will follow up in the office with the P.A.  for groin check.  Afterwards she will see Dr. Pecola Leisure back for follow up.  Antireflux therapy was initiated.  Nanetta Batty, M.D.    JB/MEDQ  D:  09/27/2002  T:  09/28/2002  Job:  [id number]   cc:   [facility]   [facility]  [facility]  [address]  [place]  [address]

## 2011-01-21 NOTE — Discharge Summary (Signed)
NAME:  Adam Marshall, Adam Marshall NO.:  [id number]   MEDICAL RECORD NO.:  [id number]          PATIENT TYPE:  IPS   LOCATION:  0506                          [facility]ACILITY[facility]  [facility]   PHYSICIAN:  Jeanice Lim, M.D. DATE OF BIRTH:  16-May-1961   DATE OF ADMISSION:  08/03/2004  DATE OF DISCHARGE:  08/07/2004                                 DISCHARGE SUMMARY   IDENTIFYING DATA:  This is a 50 year old divorced African-American male with  history of alcohol abuse, daily drinking, increased to a fifth every couple  of days. Drinking has been binge pattern but escalating. Approximately 20-  pound weight loss. Tried to stop on his own. Tired of drinking. Had a  seizure at home and sustained a head injury, hit head on the floor when  fell. Had a seizure two months ago. Reported decreased sleep and severe  history of alcohol dependency and complicated withdraw including withdraw  seizures. The patient also noted black outs. Been drinking for years.   MEDICATIONS:  Norvasc.   ALLERGIES:  PENICILLIN.   PHYSICAL AND NEUROLOGICAL EXAMINATION:  Essentially within normal limits.   ROUTINE ADMISSION LABORATORY DATA:  Within normal limits.   MENTAL STATUS EXAM:  Alert, in bed, cooperative appearance, no significant  abnormalities and appearance was appropriate. Speech clear. Mood drowsy but  affect pleasant. Thought processes goal directed. No evidence of psychotic  symptoms or dangerous ideation. Cognition intact. Judgment and insight were  fair. The patient appeared motivated to stop alcohol use. Aware of the  dangerous of his addiction.   ADMISSION DIAGNOSES:   AXIS I:  Alcohol dependency.   AXIS II:  Deferred.   AXIS III:  1.  Gout.  2.  Hypertension.  3.  Seizure disorder, possibly in part related to withdraw seizures.   AXIS IV:  Moderate stressors related to medical problems, financial, and  limited support system.   AXIS V:  35/65.   HOSPITAL COURSE:  The patient was  admitted and ordered routine p.r.n.  medications and underwent further monitoring. Encouraged to participate in  individual, group, and milieu therapy. The patient was placed on detox  protocol and safety checks. CT scan was ordered due to multiple falls and  history of seizures to rule out bleed, and the patient was optimized on  medications. The patient had significant withdraw symptoms but tolerated the  detox protocol and felt significantly improved and was discharged in  improved condition. Mood was euthymic. The patient was calm with no acute  withdraw symptoms. Motivated to remain abstinent from alcohol, especially in  light of complications medically including seizure and fall related to  alcohol use. The patient was given medication education and discharged on  Norvasc 10 mg q.a.m., Zoloft 50 mg 1/2 q.a.m. for a week and then 1 q.a.m.,  and trazodone 50 mg at 9 p.m. The patient was to follow up with __________  Monday, December 5, walk in 8 to 4 p.m. for outpatient services.   DISCHARGE DIAGNOSES:   AXIS I:  Alcohol dependency.   AXIS II:  Deferred.   AXIS III:  1.  Gout.  2.  Hypertension.  3.  Seizure disorder, possibly in part related to withdraw seizures.   AXIS IV:  Moderate stressors related to medical problems, financial, and  limited support system.   AXIS V:  55.     Jame   JEM/MEDQ  D:  09/01/2004  T:  09/01/2004  Job:  [id number]

## 2011-01-21 NOTE — Procedures (Signed)
REFERRING PHYSICIAN:  Renaye Rakers, M.D.   CLINICAL HISTORY:  This is a 49 year old male with an episode of passing  out.   MEDICATIONS LISTED:  Norvasc.   This is a 17-channel EEG performed during wakeful and drowsy states using  standard 10-20 electrode placement.   Background awake rhythm consists of 10 hertz alpha which is of diminished  amplitude while synchronous and reactive to eye opening and closure.  No  definite epileptiform activity, spikes or sharp waves are seen.  There are  no focal __________ abnormalities noted.  Sleep changes are not achieved.  Some drowsiness which is uneventful.  Intermittent photic stimulation and  hyperventilation result in no significant abnormalities.  The length of the  tracing was 24.8 minutes.  Technical component is adequate.  EKG tracing  reveals regular sinus rhythm.   IMPRESSION:  This EEG performed in wakeful states an drowsiness is within  normal limits.  No definite epileptiform activities are identified.      WUJ:WJXB  D:  06/15/2004 18:45:04  T:  06/16/2004 07:03:10  Job #:  [id number]

## 2011-02-01 ENCOUNTER — Ambulatory Visit: Payer: Medicare Other | Admitting: Family Medicine

## 2011-04-21 ENCOUNTER — Ambulatory Visit: Payer: Medicare Other | Admitting: Family Medicine

## 2011-04-22 ENCOUNTER — Encounter: Payer: Self-pay | Admitting: Family Medicine

## 2011-04-22 ENCOUNTER — Ambulatory Visit (INDEPENDENT_AMBULATORY_CARE_PROVIDER_SITE_OTHER): Payer: Medicare Other | Admitting: Family Medicine

## 2011-04-22 DIAGNOSIS — I10 Essential (primary) hypertension: Secondary | ICD-10-CM

## 2011-04-22 DIAGNOSIS — Z711 Person with feared health complaint in whom no diagnosis is made: Secondary | ICD-10-CM

## 2011-04-22 DIAGNOSIS — Z20828 Contact with and (suspected) exposure to other viral communicable diseases: Secondary | ICD-10-CM

## 2011-04-22 DIAGNOSIS — J309 Allergic rhinitis, unspecified: Secondary | ICD-10-CM

## 2011-04-22 DIAGNOSIS — J3489 Other specified disorders of nose and nasal sinuses: Secondary | ICD-10-CM

## 2011-04-22 DIAGNOSIS — R112 Nausea with vomiting, unspecified: Secondary | ICD-10-CM

## 2011-04-22 DIAGNOSIS — K769 Liver disease, unspecified: Secondary | ICD-10-CM

## 2011-04-22 DIAGNOSIS — F1010 Alcohol abuse, uncomplicated: Secondary | ICD-10-CM

## 2011-04-22 DIAGNOSIS — R7989 Other specified abnormal findings of blood chemistry: Secondary | ICD-10-CM

## 2011-04-22 DIAGNOSIS — R634 Abnormal weight loss: Secondary | ICD-10-CM

## 2011-04-22 DIAGNOSIS — E875 Hyperkalemia: Secondary | ICD-10-CM

## 2011-04-22 MED ORDER — OMEPRAZOLE 40 MG PO CPDR: 40.0000 mg | DELAYED_RELEASE_CAPSULE | Freq: Every day | ORAL | Status: DC

## 2011-04-22 MED ORDER — CETIRIZINE HCL 1 MG/ML PO SYRP: 5.0000 mg | ORAL_SOLUTION | Freq: Every day | ORAL | Status: DC

## 2011-04-22 MED ORDER — FLUTICASONE PROPIONATE 50 MCG/ACT NA SUSP: 1.0000 | Freq: Every day | NASAL | Status: DC

## 2011-04-22 NOTE — Patient Instructions (Addendum)
It was good to see you today  I think that your nausea, vomiting, and diarrhea may be coming from your alcohol intake  Stop drinking alcohol.  I am checking some labs today I am also sending you for an ultrasound of your belly  I am starting you on prilosec for your stomach Resume your other medications I am also starting you on flonase and zyrtec for your nose; use as directed Come back to see me in  1 Week.

## 2011-04-22 NOTE — Progress Notes (Signed)
  Subjective:    Patient ID: Adam Marshall, male    DOB: [birth year], 50 y.o.   MRN: [id number]  HPI 69 YOM w/ PMHx/o HTN, gout here for acute visit 2/2 to nausea, vomiting, diarrhea.  Pt states that he has had nausea, vomiting, diarrhea x 1 month. Pt thought that is due to medications and quit taking medications 3 weeks ago. Pt states that nausea, vomiting, diarrhea has persisted despite d/c of medications. Pt denies any abdominal pain, hematochezia, eye yellowing. Emesis NBNB. Nausea intermittent in nature, not associated with meals.  Diarrhea is described as runny in nature, intermittent, sometimes associated with large meals.  Stools non-melanotic in nature.  Pt also reports daily drinking of ETOH daily. Pt states that he drinks beer daily as well as drinking liquor on the weekends. No specific quantification of amount.  Pt also reports rhinorrhea and nasal congestion that has also been present since this time period. No recent fevers or sick contacts. Possible hx/o allergies in the past.   Have you ever felt you should cut down on your drinking? No  Have people annoyed you by criticising your drinking?No  Have you ever felt bad or guilty about your drinking?No Have you ever had a drink first thing in the morning to steady your nerves or get rid of a hangover (eye-opener)?Sometimes  Review of Systems See HPI     Objective:   Physical Exam Gen: up in chair, NAD HEENT: NCAT, EOMI, TMs clear bilaterally, no scleral icterus, + nasal erythema bilaterally.  CV: RRR, no murmurs auscultated PULM: CTAB, no wheezes, rales, rhoncii ABD: S/NT/+ bowel sounds  EXT: 2+ peripheral pulses   Assessment & Plan:

## 2011-04-23 LAB — CBC WITH DIFFERENTIAL/PLATELET
Basophils Absolute: 0 10*3/uL (ref 0.0–0.1)
Basophils Relative: 0 % (ref 0–1)
Hemoglobin: 12.6 g/dL — ABNORMAL LOW (ref 13.0–17.0)
MCHC: 32.9 g/dL (ref 30.0–36.0)
Monocytes Relative: 11 % (ref 3–12)
Neutro Abs: 1.9 10*3/uL (ref 1.7–7.7)
Neutrophils Relative %: 60 % (ref 43–77)
Platelets: 54 10*3/uL — ABNORMAL LOW (ref 150–400)
RDW: 15.5 % (ref 11.5–15.5)

## 2011-04-23 LAB — COMPREHENSIVE METABOLIC PANEL
ALT: 65 U/L — ABNORMAL HIGH (ref 0–53)
AST: 291 U/L — ABNORMAL HIGH (ref 0–37)
Albumin: 4.9 g/dL (ref 3.5–5.2)
Alkaline Phosphatase: 147 U/L — ABNORMAL HIGH (ref 39–117)
Potassium: 4.2 mEq/L (ref 3.5–5.3)
Sodium: 139 mEq/L (ref 135–145)
Total Bilirubin: 1.1 mg/dL (ref 0.3–1.2)
Total Protein: 7.5 g/dL (ref 6.0–8.3)

## 2011-04-23 LAB — HEPATITIS PANEL, ACUTE
HCV Ab: NEGATIVE
Hep A IgM: NEGATIVE

## 2011-04-24 DIAGNOSIS — R112 Nausea with vomiting, unspecified: Secondary | ICD-10-CM | POA: Insufficient documentation

## 2011-04-24 DIAGNOSIS — J3489 Other specified disorders of nose and nasal sinuses: Secondary | ICD-10-CM | POA: Insufficient documentation

## 2011-04-24 NOTE — Assessment & Plan Note (Signed)
Elevated today 2/2 medication non adherence. Asymptomatic. Told pt that it was ok to resume medication. Pt agreeable.

## 2011-04-24 NOTE — Assessment & Plan Note (Signed)
Will check K+ today

## 2011-04-24 NOTE — Assessment & Plan Note (Signed)
Will start on flonase and zyrtec. Encouraged smoking cessation.
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
## 2011-04-25 ENCOUNTER — Encounter: Payer: Self-pay | Admitting: Family Medicine

## 2011-04-29 ENCOUNTER — Ambulatory Visit (HOSPITAL_COMMUNITY)
Admission: RE | Admit: 2011-04-29 | Discharge: 2011-04-29 | Disposition: A | Discharge status: Home / Self Care | Payer: Medicare Other | Source: Ambulatory Visit | Attending: Family Medicine | Admitting: Family Medicine

## 2011-04-29 DIAGNOSIS — K8689 Other specified diseases of pancreas: Secondary | ICD-10-CM | POA: Insufficient documentation

## 2011-04-29 DIAGNOSIS — F1010 Alcohol abuse, uncomplicated: Secondary | ICD-10-CM

## 2011-04-29 DIAGNOSIS — R197 Diarrhea, unspecified: Secondary | ICD-10-CM | POA: Insufficient documentation

## 2011-04-29 DIAGNOSIS — R112 Nausea with vomiting, unspecified: Secondary | ICD-10-CM | POA: Insufficient documentation

## 2011-05-02 ENCOUNTER — Ambulatory Visit (INDEPENDENT_AMBULATORY_CARE_PROVIDER_SITE_OTHER): Payer: Medicare Other | Admitting: Family Medicine

## 2011-05-02 ENCOUNTER — Encounter: Payer: Self-pay | Admitting: Family Medicine

## 2011-05-02 VITALS — BP 174/115 | HR 95 | Ht 65.5 in | Wt 125.0 lb

## 2011-05-02 DIAGNOSIS — R112 Nausea with vomiting, unspecified: Secondary | ICD-10-CM

## 2011-05-02 DIAGNOSIS — I10 Essential (primary) hypertension: Secondary | ICD-10-CM

## 2011-05-02 DIAGNOSIS — K861 Other chronic pancreatitis: Secondary | ICD-10-CM

## 2011-05-02 LAB — POCT GLYCOSYLATED HEMOGLOBIN (HGB A1C): Hemoglobin A1C: 5.2

## 2011-05-02 MED ORDER — LOSARTAN POTASSIUM 50 MG PO TABS: 100.0000 mg | ORAL_TABLET | Freq: Every day | ORAL | Status: DC

## 2011-05-02 NOTE — Progress Notes (Signed)
  Subjective:    Patient ID: Adam Marshall, male    DOB: [birth year], 50 y.o.   MRN: [id number]  HPI Pt is here for follow up visit on N/V/D. Pt was recently instructed to d/c ETOH intake in setting of concern for pancreatitis and ? GB disease.   AST 291/ALT 65 Lipase WNL  Abdominal U/S: Normal sonographic appearance of the gallbladder.  Pancreatic body/tail is mildly hypoechoic but normal in size,  possibly sequela of prior/chronic pancreatitis.  Small cystic lesions in the region of the pancreatic head,  measuring up to 13 mm, possibly reflecting small pseudocysts.  Today pt states that he feels much better. Has been able to tolerated food intake with no issues. Appetite is much improved. Nausea, vomiting has resolved. HAs resumed taking all medications as previously prescribed.  HTN: Checking BP at drug stores. Usually in 140s/80s. Pt did have high salt meal yesterday including chicken souse and ham.    Review of Systems See HPI    Objective:   Physical Exam Gen: up in chair, NAD  HEENT: NCAT, EOMI, TMs clear bilaterally, no scleral icterus, + nasal erythema bilaterally.  CV: RRR, no murmurs auscultated  PULM: CTAB, no wheezes, rales, rhoncii  ABD: S/NT/+ bowel sounds  EXT: 2+ peripheral pulses    Assessment & Plan:

## 2011-05-02 NOTE — Patient Instructions (Addendum)
It was good to see you today  I am glad to see that you are feeling better I am increasing you losartan to help with your blood pressure Please stay away from salty foods  I am rechecking some labs today  Come back to see me in 1-2 weeks,  Call with any questions,  God Bless,  Adam Albee MD  1.5 Gram Low Sodium Diet A 1.5 gram sodium diet restricts the amount of sodium in the diet to no more than 1.5 grams (g) or 1500 milligrams (mg) daily. The American Heart Association recommends Americans over the age of 98 to consume no more than 1500 mg of sodium each day to reduce the risk of developing high blood pressure. Research also shows that limiting sodium may reduce heart attack and stroke risk. Many foods contain sodium for flavor and sometimes as a preservative. When the amount of sodium in a diet needs to be low, it is important to know what to look for when choosing foods and drinks. The following includes some information and guidelines to help make it easier for you to adapt to a low sodium diet. QUICK TIPS  Do not add salt to food.   Avoid convenience items and fast food.   Choose unsalted snack foods.   Buy lower sodium products, often labeled as "lower sodium" or "no salt added."   Check food labels to learn how much sodium is in 1 serving.   When eating at a restaurant, ask that your food be prepared with less salt or none, if possible.  READING FOOD LABELS FOR SODIUM INFORMATION The nutrition facts label is a good place to find how much sodium is in foods. Look for products with no more than 400 mg of sodium per serving. Remember that 1.5 g = 1500 mg. The food label may also list foods as:  Sodium-free: Less than 5 mg in a serving.   Very low sodium: 35 mg or less in a serving.   Low-sodium: 140 mg or less in a serving.   Light in sodium: 50% less sodium in a serving. For example, if a food that usually has 300 mg of sodium is changed to become light in sodium, it will  have 150 mg of sodium.   Reduced sodium: 25% less sodium in a serving. For example, if a food that usually has 400 mg of sodium is changed to reduced sodium, it will have 300 mg of sodium.  CHOOSING FOODS AVOID CHOOSE  Grains: Salted crackers and snack items. Some cereals, including instant hot cereals. Bread stuffing and biscuit mixes. Seasoned rice or pasta mixes. Grains: Unsalted snack items. Low-sodium cereals, oats, puffed wheat and rice, shredded wheat. English muffins and bread. Pasta.  Meats: Salted, canned, smoked, spiced, pickled meats, including fish and poultry. Bacon, ham, sausage, cold cuts, hot dogs, anchovies. Meats: Low-sodium canned tuna and salmon. Fresh or frozen meat, poultry, and fish.  Dairy: Processed cheese and spreads. Cottage cheese. Buttermilk and condensed milk. Regular cheese. Dairy: Milk. Low-sodium cottage cheese. Yogurt. Sour cream. Low-sodium cheese.  Fruits and Vegetables: Regular canned vegetables. Regular canned tomato sauce and paste. Frozen vegetables in sauces. Olives. Rosita Fire. Relishes. Sauerkraut. Fruits and Vegetables: Low-sodium canned vegetables. Low-sodium tomato sauce and paste. Frozen or fresh vegetables. Fresh and frozen fruit.  Condiments: Canned and packaged gravies. Worcestershire sauce. Tartar sauce. Barbecue sauce. Soy sauce. Steak sauce. Ketchup. Onion, garlic, and table salt. Meat flavorings and tenderizers. Condiments: Fresh and dried herbs and spices. Low-sodium varieties  of mustard and ketchup. Lemon juice. Tabasco sauce. Horseradish.  SAMPLE 1.5 GRAM SODIUM MEAL PLAN Meal Foods Sodium (mg)  Breakfast 1 cup low-fat milk 1 whole-wheat English muffin 1 tablespoon heart-healthy margarine 1 hard-boiled egg 1 small orange 143 [phone]  Lunch 1 cup raw carrots 2 tablespoons no salt added peanut butter 2 slices whole-wheat bread 1 tablespoon jelly  cup red grapes 76 5 [phone] 2  Dinner  1 cup whole-wheat pasta 1 cup low-sodium tomato sauce 3 ounces (oz) lean ground beef 1 small side salad (1 cup raw spinach leaves,  cup cucumber,  cup yellow bell pepper) with 1 teaspoon olive oil and 1 teaspoon red wine vinegar 2 73 57 25  Snack 1 container low-fat vanilla yogurt 3 graham cracker squares 107 127  Nutrient Analysis Calories: 1745 Protein: 75 g Carbohydrate: 237 g Fat: 57 g Sodium: 1425 mg Information from http://www.long-jenkins.com/. Document Released: 08/22/2005 Document Re-Released: 02/09/2010 [facility] Patient Information 2011 [place], [place].

## 2011-05-03 DIAGNOSIS — K861 Other chronic pancreatitis: Secondary | ICD-10-CM | POA: Insufficient documentation

## 2011-05-03 LAB — COMPREHENSIVE METABOLIC PANEL
Albumin: 4.4 g/dL (ref 3.5–5.2)
Alkaline Phosphatase: 81 U/L (ref 39–117)
BUN: 11 mg/dL (ref 6–23)
CO2: 29 mEq/L (ref 19–32)
Glucose, Bld: 91 mg/dL (ref 70–99)
Total Bilirubin: 0.5 mg/dL (ref 0.3–1.2)
Total Protein: 6.8 g/dL (ref 6.0–8.3)

## 2011-05-03 LAB — LIPID PANEL
Cholesterol: 181 mg/dL (ref 0–200)
HDL: 104 mg/dL (ref >39)
Total CHOL/HDL Ratio: 1.7 Ratio

## 2011-05-03 NOTE — Assessment & Plan Note (Signed)
Elevated today. Likely secondary to increased sodium intake. Will increase cozaar. Discussed low sodium diet. Will follow up in 2 weeks.

## 2011-05-03 NOTE — Assessment & Plan Note (Signed)
Likely from ETOH etiology Abdominal U/S:  Pancreatic body/tail is mildly hypoechoic but normal in size,  possibly sequela of prior/chronic pancreatitis.  Small cystic lesions in the region of the pancreatic head,  measuring up to 13 mm, possibly reflecting small pseudocysts.

## 2011-05-03 NOTE — Assessment & Plan Note (Signed)
Likely secondary to ETOH intake and chronic pancreatitis. Much improved s/p discontinuation of ETOH. Will continue to follow.

## 2011-05-04 ENCOUNTER — Encounter: Payer: Self-pay | Admitting: Family Medicine

## 2011-05-16 ENCOUNTER — Ambulatory Visit: Payer: Medicare Other | Admitting: Family Medicine

## 2011-06-27 ENCOUNTER — Ambulatory Visit: Payer: Medicare Other | Admitting: Family Medicine

## 2011-07-01 ENCOUNTER — Telehealth: Payer: Self-pay | Admitting: Family Medicine

## 2011-07-01 ENCOUNTER — Other Ambulatory Visit: Payer: Self-pay | Admitting: Family Medicine

## 2011-07-01 NOTE — Telephone Encounter (Signed)
Needs refills on Omeprazole, Allopurinol, Carvepilol sent to [facility] on [address].  She has not contacted the pharmacy, but she will.  She just thought that they would not refill if all of the refills were out.  A refill request should be coming.

## 2011-07-01 NOTE — Telephone Encounter (Signed)
Refill request

## 2011-07-01 NOTE — Telephone Encounter (Signed)
Forward to Dr Alvester Morin for review.Adam Marshall, Adam Marshall

## 2011-09-08 ENCOUNTER — Telehealth: Payer: Self-pay | Admitting: Family Medicine

## 2011-09-08 NOTE — Telephone Encounter (Signed)
Mr. Haq would like to talk to Dr. Alvester Morin about his last appointment.

## 2011-09-08 NOTE — Telephone Encounter (Signed)
Called pt and informed of negative labs at his last OV. Lorenda Hatchet, Renato Battles

## 2011-09-09 ENCOUNTER — Other Ambulatory Visit: Payer: Self-pay | Admitting: Family Medicine

## 2011-09-09 NOTE — Telephone Encounter (Signed)
Refill request

## 2011-12-02 ENCOUNTER — Encounter (HOSPITAL_COMMUNITY): Payer: Self-pay | Admitting: *Deleted

## 2011-12-02 ENCOUNTER — Emergency Department (HOSPITAL_COMMUNITY)
Admission: EM | Admit: 2011-12-02 | Discharge: 2011-12-02 | Disposition: A | Discharge status: Home / Self Care | Payer: Medicare Other | Attending: Emergency Medicine | Admitting: Emergency Medicine

## 2011-12-02 DIAGNOSIS — Y92009 Unspecified place in unspecified non-institutional (private) residence as the place of occurrence of the external cause: Secondary | ICD-10-CM | POA: Insufficient documentation

## 2011-12-02 DIAGNOSIS — R109 Unspecified abdominal pain: Secondary | ICD-10-CM | POA: Insufficient documentation

## 2011-12-02 DIAGNOSIS — X58XXXA Exposure to other specified factors, initial encounter: Secondary | ICD-10-CM | POA: Insufficient documentation

## 2011-12-02 DIAGNOSIS — I10 Essential (primary) hypertension: Secondary | ICD-10-CM | POA: Insufficient documentation

## 2011-12-02 DIAGNOSIS — G8929 Other chronic pain: Secondary | ICD-10-CM | POA: Insufficient documentation

## 2011-12-02 DIAGNOSIS — Z79899 Other long term (current) drug therapy: Secondary | ICD-10-CM | POA: Insufficient documentation

## 2011-12-02 DIAGNOSIS — T148XXA Other injury of unspecified body region, initial encounter: Secondary | ICD-10-CM

## 2011-12-02 HISTORY — DX: Other chronic pain: G89.29

## 2011-12-02 HISTORY — DX: Pain in unspecified knee: M25.569

## 2011-12-02 HISTORY — DX: Dorsalgia, unspecified: M54.9

## 2011-12-02 HISTORY — DX: Essential (primary) hypertension: I10

## 2011-12-02 LAB — URINALYSIS, ROUTINE W REFLEX MICROSCOPIC
Glucose, UA: NEGATIVE mg/dL
Ketones, ur: NEGATIVE mg/dL
Leukocytes, UA: NEGATIVE
Nitrite: NEGATIVE
Protein, ur: NEGATIVE mg/dL
pH: 6.5 (ref 5.0–8.0)

## 2011-12-02 MED ORDER — DIAZEPAM 5 MG PO TABS: 5.0000 mg | ORAL_TABLET | Freq: Two times a day (BID) | ORAL | Status: AC

## 2011-12-02 MED ORDER — KETOROLAC TROMETHAMINE 60 MG/2ML IM SOLN
60.0000 mg | Freq: Once | INTRAMUSCULAR | Status: AC
  Administered 2011-12-02: 60 mg via INTRAMUSCULAR
  Filled 2011-12-02: qty 2

## 2011-12-02 MED ORDER — IBUPROFEN 800 MG PO TABS: 800.0000 mg | ORAL_TABLET | Freq: Three times a day (TID) | ORAL | Status: AC

## 2011-12-02 MED ORDER — DIAZEPAM 5 MG PO TABS
5.0000 mg | ORAL_TABLET | Freq: Once | ORAL | Status: AC
  Administered 2011-12-02: 5 mg via ORAL
  Filled 2011-12-02: qty 1

## 2011-12-02 NOTE — ED Notes (Signed)
Pt states "I have been having left groin & left leg pain for about 3-4 hours now"

## 2011-12-02 NOTE — Discharge Instructions (Signed)
Take valium as needed for severe pain.   Do not drive within four hours of taking this medication (may cause drowsiness or confusion).  Take ibuprofen w/ food up to three times a day, as needed for pain.  Apply ice to your groin muscles 2-3 times a day for 15-20 minutes and avoid activities that aggravate pain.  You should return to the ER if you develop fever, worsening pain, leg weakness, testicular pain or any other concerning symptoms.

## 2011-12-02 NOTE — ED Provider Notes (Signed)
History     CSN: [id number]  Arrival date & time 12/02/11  1728   First MD Initiated Contact with Patient 12/02/11 1757      Chief Complaint  Patient presents with  . Groin Pain  . Leg Pain    (Consider location/radiation/quality/duration/timing/severity/associated sxs/prior treatment) HPI History provided by pt.   Pt was sitting on edge of bed, went to stand up and developed immediate pain in left groin.  Pain aggravated by minimal hip flexion and radiates into his thigh.  Describes as sharp.  Denies fever, testicular pain, dysuria, urethral discharge and rash but has been urinating very frequently over the past three days (no h/o diabetes or c/o polydipsia or change in appetite).  Denies LE weakness/paresthesias and bowel dysfunction.  Has h/o chronic left low back pain which is currently stable.  No recent trauma.    Past Medical History  Diagnosis Date  . Hypertension   . Chronic back pain   . Gout   . Chronic knee pain     left    Past Surgical History  Procedure Date  . Knee surgery     left    No family history on file.  History  Substance Use Topics  . Smoking status: Current Everyday Smoker -- 0.3 packs/day  . Smokeless tobacco: Not on file  . Alcohol Use: Yes     560 ounces a week      Review of Systems  All other systems reviewed and are negative.    Allergies  Penicillins  Home Medications   Current Outpatient Rx  Name Route Sig Dispense Refill  . ALLOPURINOL 100 MG PO TABS  take 1 tablet by mouth twice a day 60 tablet 2  . CARVEDILOL 25 MG PO TABS Oral Take 25 mg by mouth 2 (two) times daily with a meal.      . COLCRYS 0.6 MG PO TABS  take 2 tablets by mouth THEN 1 TABLET 1 HOUR LATER. THEN TAKE 1 TABLET DAILY THEREAFTER. 33 tablet 1  . FLUTICASONE PROPIONATE 50 MCG/ACT NA SUSP  instill 1 spray into each nostril once daily 16 g 2  . CARVEDILOL 12.5 MG PO TABS Oral Take 2 tablets (25 mg total) by mouth 2 (two) times daily. 60 tablet 11     BP 150/84  Pulse 96  Temp(Src) 99.2 F (37.3 C) (Oral)  Resp 16  Wt 135 lb (61.236 kg)  SpO2 99%  Physical Exam  Nursing note and vitals reviewed. Constitutional: He is oriented to person, place, and time. He appears well-developed and well-nourished. No distress.  HENT:  Head: Normocephalic and atraumatic.  Eyes:       Normal appearance  Neck: Normal range of motion.  Genitourinary:       Circumcised.  No urethral discharge or rash.  Testicles descended bilaterally.  No masses.  Non-tender.    Musculoskeletal:       Entire spine non-tender.  Lipoma just left of lumbar spine.  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  Pelvis stable and left hip and groin non-tender.  Pain w/ passive flexion and especially internal/external rotation of left hip.  5/5 strength and distal NV intact.  Pt ambulates but appears uncomfortable when steps forwards w/ left foot.   Neurological: He is alert and oriented to person, place, and time.  Psychiatric: He has a normal mood and affect. His behavior is normal.    ED Course  Procedures (including critical care time)  Labs Reviewed  URINALYSIS, ROUTINE W REFLEX MICROSCOPIC   No results found.   1. Muscle strain       MDM  51yo M presents w/ acute onset left groin pain while standing up from edge of bed.  No GU sx w/ exception of increased urinary frequency and normal testicular exam.  S/sx most consistent w/ muscle strain.  Pt receiving po valium and IM toradol and U/A pending.  Will reassess shortly.  U/A neg for infection.  Pt reports improvement in pain.  On re-examination, decreased pain w/ passive ROM of right hip and patient ambulating w/out difficulty.  D/c'd home w/ valium and ibuprofen.  Return precautions discussed.              Adam Marshall, [place] 12/03/11 [phone]

## 2011-12-03 NOTE — ED Provider Notes (Signed)
Medical screening examination/treatment/procedure(s) were performed by non-physician practitioner and as supervising physician I was immediately available for consultation/collaboration.  Cheri Guppy, MD 12/03/11 [phone]

## 2011-12-13 ENCOUNTER — Other Ambulatory Visit: Payer: Self-pay | Admitting: Family Medicine

## 2012-01-17 ENCOUNTER — Other Ambulatory Visit: Payer: Self-pay | Admitting: Family Medicine

## 2012-02-24 ENCOUNTER — Emergency Department (HOSPITAL_COMMUNITY): Payer: Medicare Other

## 2012-02-24 ENCOUNTER — Encounter (HOSPITAL_COMMUNITY): Payer: Self-pay | Admitting: *Deleted

## 2012-02-24 ENCOUNTER — Emergency Department (HOSPITAL_COMMUNITY)
Admission: EM | Admit: 2012-02-24 | Discharge: 2012-02-24 | Disposition: A | Discharge status: Home / Self Care | Payer: Medicare Other | Attending: Emergency Medicine | Admitting: Emergency Medicine

## 2012-02-24 DIAGNOSIS — F10929 Alcohol use, unspecified with intoxication, unspecified: Secondary | ICD-10-CM

## 2012-02-24 DIAGNOSIS — Z8639 Personal history of other endocrine, nutritional and metabolic disease: Secondary | ICD-10-CM | POA: Insufficient documentation

## 2012-02-24 DIAGNOSIS — E860 Dehydration: Secondary | ICD-10-CM

## 2012-02-24 DIAGNOSIS — R55 Syncope and collapse: Secondary | ICD-10-CM | POA: Insufficient documentation

## 2012-02-24 DIAGNOSIS — I10 Essential (primary) hypertension: Secondary | ICD-10-CM | POA: Insufficient documentation

## 2012-02-24 DIAGNOSIS — Z7982 Long term (current) use of aspirin: Secondary | ICD-10-CM | POA: Insufficient documentation

## 2012-02-24 DIAGNOSIS — R42 Dizziness and giddiness: Secondary | ICD-10-CM | POA: Insufficient documentation

## 2012-02-24 DIAGNOSIS — F1010 Alcohol abuse, uncomplicated: Secondary | ICD-10-CM | POA: Insufficient documentation

## 2012-02-24 DIAGNOSIS — Z79899 Other long term (current) drug therapy: Secondary | ICD-10-CM | POA: Insufficient documentation

## 2012-02-24 DIAGNOSIS — Z862 Personal history of diseases of the blood and blood-forming organs and certain disorders involving the immune mechanism: Secondary | ICD-10-CM | POA: Insufficient documentation

## 2012-02-24 DIAGNOSIS — M549 Dorsalgia, unspecified: Secondary | ICD-10-CM | POA: Insufficient documentation

## 2012-02-24 DIAGNOSIS — G8929 Other chronic pain: Secondary | ICD-10-CM | POA: Insufficient documentation

## 2012-02-24 LAB — COMPREHENSIVE METABOLIC PANEL
ALT: 37 U/L (ref 0–53)
AST: 120 U/L — ABNORMAL HIGH (ref 0–37)
Albumin: 4.2 g/dL (ref 3.5–5.2)
Alkaline Phosphatase: 91 U/L (ref 39–117)
Potassium: 4.5 mEq/L (ref 3.5–5.1)
Sodium: 132 mEq/L — ABNORMAL LOW (ref 135–145)
Total Protein: 7.8 g/dL (ref 6.0–8.3)

## 2012-02-24 LAB — DIFFERENTIAL
Basophils Absolute: 0 10*3/uL (ref 0.0–0.1)
Eosinophils Absolute: 0 10*3/uL (ref 0.0–0.7)
Lymphs Abs: 0.8 10*3/uL (ref 0.7–4.0)
Neutro Abs: 7.6 10*3/uL (ref 1.7–7.7)

## 2012-02-24 LAB — CBC
HCT: 33.7 % — ABNORMAL LOW (ref 39.0–52.0)
MCHC: 34.4 g/dL (ref 30.0–36.0)
MCV: 100.3 fL — ABNORMAL HIGH (ref 78.0–100.0)
RDW: 13.3 % (ref 11.5–15.5)

## 2012-02-24 LAB — RAPID URINE DRUG SCREEN, HOSP PERFORMED
Barbiturates: NOT DETECTED
Cocaine: NOT DETECTED
Tetrahydrocannabinol: NOT DETECTED

## 2012-02-24 LAB — LIPASE, BLOOD: Lipase: 53 U/L (ref 11–59)

## 2012-02-24 MED ORDER — THIAMINE HCL 100 MG/ML IJ SOLN
100.0000 mg | Freq: Every day | INTRAMUSCULAR | Status: DC
  Administered 2012-02-24: 100 mg via INTRAVENOUS
  Filled 2012-02-24: qty 2

## 2012-02-24 MED ORDER — ONDANSETRON HCL 4 MG/2ML IJ SOLN
4.0000 mg | Freq: Once | INTRAMUSCULAR | Status: AC
  Administered 2012-02-24: 4 mg via INTRAVENOUS
  Filled 2012-02-24: qty 2

## 2012-02-24 MED ORDER — SODIUM CHLORIDE 0.9 % IV BOLUS (SEPSIS)
1000.0000 mL | Freq: Once | INTRAVENOUS | Status: AC
  Administered 2012-02-24: 1000 mL via INTRAVENOUS

## 2012-02-24 MED ORDER — PROMETHAZINE HCL 25 MG PO TABS: 25.0000 mg | ORAL_TABLET | Freq: Four times a day (QID) | ORAL | Status: DC | PRN

## 2012-02-24 NOTE — ED Notes (Signed)
Patient aware that we need urune. States he will try soon.

## 2012-02-24 NOTE — ED Notes (Signed)
Up to bathroom to ambulate steady on feet with one assit

## 2012-02-24 NOTE — ED Notes (Signed)
Pt given urinal unable to void at this time

## 2012-02-24 NOTE — ED Provider Notes (Signed)
4:06 PM Care from Dr. Radford Pax.  Awaiting labs at this time.  6:00 PM Pt vomited once in ER.  Patient feels much better at this time.  He ambulated in the emergency department without any difficulty or lightheadedness.  I suspect the majority of his near-syncopal episode today was secondary to dehydration.  I am not actually certain this was seizure at all.  1. Dehydration   2. Alcohol intoxication      Lyanne Co, MD 02/24/12 1801

## 2012-02-24 NOTE — ED Notes (Signed)
Per EMS pt in from home reports passing out while walking to BR, BP on ems arrival 86/62. Pt reports cramping in legs, denies other complaints. Reports drinking last night.

## 2012-02-24 NOTE — ED Notes (Signed)
Reports drinking last night, pt did not eat breakfast this morning. Experienced dizziness while going to bathroom.

## 2012-02-24 NOTE — ED Notes (Signed)
N/v after after drinking h2o EDMD aware.Dr Rivka Spring at the bs

## 2012-02-24 NOTE — ED Notes (Signed)
[id number]<AV> Expected date:02/24/12<BR> Expected time: 1:32 PM<BR> Means of arrival:Ambulance<BR> Comments:<BR> syncope

## 2012-02-24 NOTE — ED Provider Notes (Signed)
History     CSN: [id number]  Arrival date & time 02/24/12  1338   First MD Initiated Contact with Patient 02/24/12 1436      Chief Complaint  Patient presents with  . Loss of Consciousness     HPI Patient presented via EMS after having near syncope episode while going to the bathroom.  Patient tells me he did not pass out but he got very dizzy after her getting up today.  He does admit to drinking last night.  He says he did not drink very much.  Patient has long history of alcohol abuse and according to his PMDs notes has history of chronic pancreatitis because of that.  Patient denies abdominal pain or headache.  Patient denies recent fever, or chills.  Patient denies chest pain.  Further history from his life revealed that when he fell to the ground his eyes were open and he had tonic type movements but no shaking.  This lasted a few minutes.  Since that time he seems to be postictal period according to her he's had a seizure in the past and this looked very similar to the same thing he had the past. Past Medical History  Diagnosis Date  . Hypertension   . Chronic back pain   . Gout   . Chronic knee pain     left    Past Surgical History  Procedure Date  . Knee surgery     left    History reviewed. No pertinent family history.  History  Substance Use Topics  . Smoking status: Current Everyday Smoker -- 0.3 packs/day    Types: Cigarettes  . Smokeless tobacco: Not on file  . Alcohol Use: Yes     560 ounces a week      Review of Systems  All other systems reviewed and are negative.    Allergies  Penicillins  Home Medications   Current Outpatient Rx  Name Route Sig Dispense Refill  . ALLOPURINOL 100 MG PO TABS Oral Take 100 mg by mouth 2 (two) times daily.    . ASPIRIN 81 MG PO CHEW Oral Chew 81 mg by mouth daily.    Marland Kitchen CARVEDILOL 12.5 MG PO TABS Oral Take 25 mg by mouth 2 (two) times daily with a meal.    . FLUTICASONE PROPIONATE 50 MCG/ACT NA SUSP Nasal  Place 1 spray into the nose daily.    Marland Kitchen LOSARTAN POTASSIUM 50 MG PO TABS Oral Take 100 mg by mouth daily.    Marland Kitchen PROMETHAZINE HCL 25 MG PO TABS Oral Take 1 tablet (25 mg total) by mouth every 6 (six) hours as needed for nausea. 15 tablet 0    BP 130/70  Pulse 82  Temp 98.4 F (36.9 C) (Oral)  Resp 18  SpO2 99%  Physical Exam  Nursing note and vitals reviewed. Constitutional: He is oriented to person, place, and time. He appears well-developed and well-nourished. No distress.  HENT:  Head: Normocephalic and atraumatic.  Eyes: Pupils are equal, round, and reactive to light.  Neck: Normal range of motion.  Cardiovascular: Normal rate and intact distal pulses.         Date: 02/24/2012  Rate: 67  Rhythm: normal sinus rhythm  QRS Axis: normal  Intervals: normal  ST/T Wave abnormalities: normal  Conduction Disutrbances: none  Narrative Interpretation: unremarkable      Pulmonary/Chest: No respiratory distress.  Abdominal: Normal appearance. He exhibits no distension. There is no tenderness. There is no rebound.  Musculoskeletal:  Normal range of motion.  Neurological: He is alert and oriented to person, place, and time. No cranial nerve deficit.  Skin: Skin is warm and dry. No rash noted.  Psychiatric: He has a normal mood and affect. His behavior is normal.    ED Course  Procedures (including critical care time) Scheduled Meds:   Continuous Infusions:   PRN Meds:.    Labs Reviewed  COMPREHENSIVE METABOLIC PANEL - Abnormal; Notable for the following:    Sodium 132 (*)     Chloride 91 (*)     CO2 18 (*)     Creatinine, Ser 1.79 (*)     AST 120 (*)     GFR calc non Af Amer 42 (*)     GFR calc Af Amer 49 (*)     All other components within normal limits  CBC - Abnormal; Notable for the following:    RBC 3.36 (*)     Hemoglobin 11.6 (*)     HCT 33.7 (*)     MCV 100.3 (*)     MCH 34.5 (*)     Platelets 81 (*)     All other components within normal limits    DIFFERENTIAL - Abnormal; Notable for the following:    Neutrophils Relative 82 (*)     Lymphocytes Relative 9 (*)     All other components within normal limits  ETHANOL - Abnormal; Notable for the following:    Alcohol, Ethyl (B) 147 (*)     All other components within normal limits  URINE RAPID DRUG SCREEN (HOSP PERFORMED)  TROPONIN I  LIPASE, BLOOD  LAB REPORT - SCANNED   Ct Head Wo Contrast  02/24/2012  *RADIOLOGY REPORT*  Clinical Data: Dizziness.  Syncope.  CT HEAD WITHOUT CONTRAST  Technique:  Contiguous axial images were obtained from the base of the skull through the vertex without contrast.  Comparison: Unenhanced cranial CT 08/04/2004, 07/17/2003.  Findings: Moderate cortical atrophy, mild deep atrophy, and moderate cerebellar atrophy, unchanged.  Mild changes of small vessel disease of the white matter diffusely, unchanged.  No mass lesion.  No midline shift.  No acute hemorrhage or hematoma.  No extra-axial fluid collections.  No evidence of acute infarction. No significant interval change.  No skull fracture or other focal osseous abnormality involving the skull.  Visualized paranasal sinuses, mastoid air cells, and middle ear cavities well-aerated.  Bilateral carotid siphon atherosclerosis.  IMPRESSION:  1.  No acute intracranial abnormality. 2.  Moderate generalized atrophy and mild chronic microvascular ischemic changes of the white matter.  Original Report Authenticated By: Arnell Sieving, M.D.     1. Dehydration   2. Alcohol intoxication       MDM         Nelia Shi, MD 02/26/12 [phone]

## 2012-06-27 ENCOUNTER — Telehealth: Payer: Self-pay | Admitting: Emergency Medicine

## 2012-06-27 NOTE — Telephone Encounter (Signed)
Attempted to call patient to discuss his request for [facility] medical products.  Left voicemail for him to call the clinic.  He will need to be seen before I can fill out the medical device prescriptions.

## 2012-10-01 ENCOUNTER — Other Ambulatory Visit: Payer: Self-pay | Admitting: Family Medicine

## 2012-10-04 ENCOUNTER — Other Ambulatory Visit: Payer: Self-pay | Admitting: Family Medicine

## 2013-01-10 ENCOUNTER — Other Ambulatory Visit: Payer: Self-pay | Admitting: *Deleted

## 2013-01-11 MED ORDER — CARVEDILOL 12.5 MG PO TABS: 25.0000 mg | ORAL_TABLET | Freq: Two times a day (BID) | ORAL | Status: DC

## 2014-12-08 ENCOUNTER — Ambulatory Visit (INDEPENDENT_AMBULATORY_CARE_PROVIDER_SITE_OTHER)
Admission: RE | Admit: 2014-12-08 | Discharge: 2014-12-08 | Disposition: A | Discharge status: Home / Self Care | Payer: Medicare HMO | Source: Ambulatory Visit | Attending: Family | Admitting: Family

## 2014-12-08 ENCOUNTER — Telehealth: Payer: Self-pay | Admitting: Family

## 2014-12-08 ENCOUNTER — Encounter: Payer: Self-pay | Admitting: Family

## 2014-12-08 ENCOUNTER — Ambulatory Visit (INDEPENDENT_AMBULATORY_CARE_PROVIDER_SITE_OTHER): Payer: Medicare HMO | Admitting: Family

## 2014-12-08 VITALS — BP 122/90 | HR 109 | Temp 98.3°F | Resp 18 | Ht 65.5 in | Wt 116.0 lb

## 2014-12-08 DIAGNOSIS — Z7189 Other specified counseling: Secondary | ICD-10-CM

## 2014-12-08 DIAGNOSIS — M109 Gout, unspecified: Secondary | ICD-10-CM

## 2014-12-08 DIAGNOSIS — M1712 Unilateral primary osteoarthritis, left knee: Secondary | ICD-10-CM

## 2014-12-08 DIAGNOSIS — M25569 Pain in unspecified knee: Secondary | ICD-10-CM | POA: Insufficient documentation

## 2014-12-08 DIAGNOSIS — M25562 Pain in left knee: Secondary | ICD-10-CM | POA: Diagnosis not present

## 2014-12-08 DIAGNOSIS — Z7689 Persons encountering health services in other specified circumstances: Secondary | ICD-10-CM

## 2014-12-08 NOTE — Telephone Encounter (Signed)
Please inform patient that his left knee x-rays were normal with no evidence of fracture or bone demineralization indicating osteoarthritis. The next step for him would be referral to physical therapy or to sports medicine for potential cortisone injection.

## 2014-12-08 NOTE — Progress Notes (Signed)
Subjective:    Patient ID: Adam Marshall, male    DOB: 11/26/1960, 54 y.o.   MRN: [id number]  Chief Complaint  Patient presents with  . Establish Care    x6 months, right knee swelling, sometimes hurts to walk on it, says he couldn't get any sleep last night, does get occasional gout in the left foot    HPI:  Adam Marshall is a 54 y.o. male who presents today to establish care and discuss his right knee.  1) Left knee - Associated symptom of pain and swelling located in his left knee has been going on for about 6 months. Pain is described as a dull achy which is exacerbated by going up stairs. Knee pain ranges from 7-10/10 depending upon movement and activity. Modifying factors include the muscle relaxers which seems to help. Was previously seen by orthopedics and it was suggested that he have a total knee replacement on the left which he declined at the time. He notes that he walks with a cane and is still not interested in having his knee replaced.   2) Gout - Stable and controlled with allopurinol and indomethicin for flares.    3) Request for Podiatry - Patient indicates that he has "bad feet" and would like to see a food doctor.   Allergies  Allergen Reactions  . Penicillins Hives and Rash    Current Outpatient Prescriptions on File Prior to Visit  Medication Sig Dispense Refill  . allopurinol (ZYLOPRIM) 100 MG tablet Take 100 mg by mouth 2 (two) times daily.    Marland Kitchen aspirin 81 MG chewable tablet Chew 81 mg by mouth daily.    . carvedilol (COREG) 12.5 MG tablet Take 2 tablets (25 mg total) by mouth 2 (two) times daily with a meal. 60 tablet 0  . fluticasone (FLONASE) 50 MCG/ACT nasal spray Place 1 spray into the nose daily.     No current facility-administered medications on file prior to visit.    Past Medical History  Diagnosis Date  . Hypertension   . Chronic back pain   . Gout   . Chronic knee pain     left    Past Surgical History  Procedure Laterality  Date  . Knee surgery      left    Family History  Problem Relation Age of Onset  . Diabetes Mother   . Diabetes Maternal Grandmother     History   Social History  . Marital Status: Divorced    Spouse Name: N/A  . Number of Children: 1  . Years of Education: 14   Occupational History  . Disability     Knee / Back   Social History Main Topics  . Smoking status: Current Every Day Smoker -- 0.30 packs/day for 30 years    Types: Cigarettes  . Smokeless tobacco: Never Used  . Alcohol Use: Yes     Comment: Occasional  . Drug Use: No  . Sexual Activity: Not on file   Other Topics Concern  . Not on file   Social History Narrative   Born and raised in [place], [place]. Fun: Stay home and watch TV.    Denies religious beliefs that would effect health care.     Review of Systems  Constitutional: Negative for fever and chills.  Musculoskeletal:       Positive for knee pain      Objective:    BP 122/90 mmHg  Pulse 109  Temp(Src) 98.3 F (36.8  C) (Oral)  Resp 18  Ht 5' 5.5" (1.664 m)  Wt 116 lb (52.617 kg)  BMI 19.00 kg/m2  SpO2 94% Nursing note and vital signs reviewed.  Physical Exam  Constitutional: He is oriented to person, place, and time. He appears well-developed and well-nourished. No distress.  Cardiovascular: Normal rate, regular rhythm, normal heart sounds and intact distal pulses.   Pulmonary/Chest: Effort normal and breath sounds normal.  Musculoskeletal:  No obvious deformity or discoloration of left knee noted. Previous surgical scar noted anterior knee from distal patella to tibial tuberosity. No obvious edema. No palpable tenderness elicited. Ligamentous testing and meniscal testing is negative. Patient displays full range of motion with 4+/5 strength. Distal pulses and sensation are intact and appropriate.  Neurological: He is alert and oriented to person, place, and time.  Skin: Skin is warm and dry.  Psychiatric: He has a normal mood and affect.  His behavior is normal. Judgment and thought content normal.       Assessment & Plan:

## 2014-12-08 NOTE — Assessment & Plan Note (Signed)
Symptoms and exam consistent with potential arthritis. Obtain x-rays to rule out deformities. Patient indicates he is not interested in potential total knee replacement. Attempt conservative treatment with either physical therapy or referral to sports medicine for potential cortisone injection. Follow up pending x-ray.

## 2014-12-08 NOTE — Assessment & Plan Note (Signed)
Stable with current regimen of allopurinol and indomethacin as needed. Continue current dosages of allopurinol and indomethacin as needed.

## 2014-12-08 NOTE — Progress Notes (Signed)
Pre visit review using our clinic review tool, if applicable. No additional management support is needed unless otherwise documented below in the visit note. 

## 2014-12-08 NOTE — Patient Instructions (Signed)
Thank you for choosing [facility].  Summary/Instructions:  Please stop by radiology on the basement level of the building for your x-rays. Your results will be released to [place] (or called to you) after review, usually within 72 hours after test completion. If any treatments or changes are necessary, you will be notified at that same time.  Referrals have been made during this visit. You should expect to hear back from our schedulers in about 10-14 days in regards to establishing an appointment with the specialists we discussed. - Podiatry  If your symptoms worsen or fail to improve, please contact our office for further instruction, or in case of emergency go directly to the emergency room at the closest medical facility.   Osteoarthritis Osteoarthritis is a disease that causes soreness and inflammation of a joint. It occurs when the cartilage at the affected joint wears down. Cartilage acts as a cushion, covering the ends of bones where they meet to form a joint. Osteoarthritis is the most common form of arthritis. It often occurs in older people. The joints affected most often by this condition include those in the:  Ends of the fingers.  Thumbs.  Neck.  Lower back.  Knees.  Hips. CAUSES  Over time, the cartilage that covers the ends of bones begins to wear away. This causes bone to rub on bone, producing pain and stiffness in the affected joints.  RISK FACTORS Certain factors can increase your chances of having osteoarthritis, including:  Older age.  Excessive body weight.  Overuse of joints.  Previous joint injury. SIGNS AND SYMPTOMS   Pain, swelling, and stiffness in the joint.  Over time, the joint may lose its normal shape.  Small deposits of bone (osteophytes) may grow on the edges of the joint.  Bits of bone or cartilage can break off and float inside the joint space. This may cause more pain and damage. DIAGNOSIS  Your health care provider will do a  physical exam and ask about your symptoms. Various tests may be ordered, such as:  X-rays of the affected joint.  An MRI scan.  Blood tests to rule out other types of arthritis.  Joint fluid tests. This involves using a needle to draw fluid from the joint and examining the fluid under a microscope. TREATMENT  Goals of treatment are to control pain and improve joint function. Treatment plans may include:  A prescribed exercise program that allows for rest and joint relief.  A weight control plan.  Pain relief techniques, such as:  Properly applied heat and cold.  Electric pulses delivered to nerve endings under the skin (transcutaneous electrical nerve stimulation [TENS]).  Massage.  Certain nutritional supplements.  Medicines to control pain, such as:  Acetaminophen.  Nonsteroidal anti-inflammatory drugs (NSAIDs), such as naproxen.  Narcotic or central-acting agents, such as tramadol.  Corticosteroids. These can be given orally or as an injection.  Surgery to reposition the bones and relieve pain (osteotomy) or to remove loose pieces of bone and cartilage. Joint replacement may be needed in advanced states of osteoarthritis. HOME CARE INSTRUCTIONS   Take medicines only as directed by your health care provider.  Maintain a healthy weight. Follow your health care provider's instructions for weight control. This may include dietary instructions.  Exercise as directed. Your health care provider can recommend specific types of exercise. These may include:  Strengthening exercises. These are done to strengthen the muscles that support joints affected by arthritis. They can be performed with weights or with exercise  bands to add resistance.  Aerobic activities. These are exercises, such as brisk walking or low-impact aerobics, that get your heart pumping.  Range-of-motion activities. These keep your joints limber.  Balance and agility exercises. These help you maintain  daily living skills.  Rest your affected joints as directed by your health care provider.  Keep all follow-up visits as directed by your health care provider. SEEK MEDICAL CARE IF:   Your skin turns red.  You develop a rash in addition to your joint pain.  You have worsening joint pain.  You have a fever along with joint or muscle aches. SEEK IMMEDIATE MEDICAL CARE IF:  You have a significant loss of weight or appetite.  You have night sweats. [place] of Arthritis and Musculoskeletal and Skin Diseases: www.niams.SouthExposed.es  [employer] on Aging: http://kim-miller.com/  American College of Rheumatology: www.rheumatology.org Document Released: 08/22/2005 Document Revised: 01/06/2014 Document Reviewed: 04/29/2013 [facility] Patient Information 2015 [place], [place]. This information is not intended to replace advice given to you by your health care provider. Make sure you discuss any questions you have with your health care provider.

## 2014-12-09 ENCOUNTER — Telehealth: Payer: Self-pay | Admitting: Family

## 2014-12-09 NOTE — Telephone Encounter (Signed)
emmi mailed  °

## 2014-12-09 NOTE — Telephone Encounter (Signed)
Referral placed.

## 2014-12-09 NOTE — Telephone Encounter (Signed)
Pt aware of results. Would like the referral

## 2014-12-10 ENCOUNTER — Ambulatory Visit: Payer: Medicare Other | Admitting: Family

## 2014-12-25 ENCOUNTER — Encounter: Payer: Self-pay | Admitting: Family

## 2014-12-25 ENCOUNTER — Ambulatory Visit (INDEPENDENT_AMBULATORY_CARE_PROVIDER_SITE_OTHER): Payer: Medicare HMO | Admitting: Family

## 2014-12-25 ENCOUNTER — Other Ambulatory Visit (INDEPENDENT_AMBULATORY_CARE_PROVIDER_SITE_OTHER): Payer: Medicare HMO

## 2014-12-25 VITALS — BP 144/96 | HR 104 | Temp 98.8°F | Resp 18 | Ht 65.5 in | Wt 116.0 lb

## 2014-12-25 DIAGNOSIS — Z0000 Encounter for general adult medical examination without abnormal findings: Secondary | ICD-10-CM

## 2014-12-25 DIAGNOSIS — Z0001 Encounter for general adult medical examination with abnormal findings: Secondary | ICD-10-CM | POA: Insufficient documentation

## 2014-12-25 DIAGNOSIS — I10 Essential (primary) hypertension: Secondary | ICD-10-CM | POA: Diagnosis not present

## 2014-12-25 LAB — CBC
HCT: 37.6 % — ABNORMAL LOW (ref 39.0–52.0)
Hemoglobin: 12.8 g/dL — ABNORMAL LOW (ref 13.0–17.0)
MCHC: 33.9 g/dL (ref 30.0–36.0)
MCV: 101 fl — AB (ref 78.0–100.0)
PLATELETS: 79 10*3/uL — AB (ref 150.0–400.0)
RBC: 3.72 Mil/uL — ABNORMAL LOW (ref 4.22–5.81)
RDW: 15.9 % — ABNORMAL HIGH (ref 11.5–15.5)
WBC: 6.3 10*3/uL (ref 4.0–10.5)

## 2014-12-25 LAB — LIPID PANEL
CHOL/HDL RATIO: 2
Cholesterol: 189 mg/dL (ref 0–200)
HDL: 92.7 mg/dL (ref >39.00)
LDL CALC: 76 mg/dL (ref 0–99)
NonHDL: 96.3
Triglycerides: 102 mg/dL (ref 0.0–149.0)
VLDL: 20.4 mg/dL (ref 0.0–40.0)

## 2014-12-25 LAB — BASIC METABOLIC PANEL
BUN: 7 mg/dL (ref 6–23)
CO2: 24 meq/L (ref 19–32)
CREATININE: 0.71 mg/dL (ref 0.40–1.50)
Calcium: 10.2 mg/dL (ref 8.4–10.5)
Chloride: 91 mEq/L — ABNORMAL LOW (ref 96–112)
GFR: 148.25 mL/min (ref >60.00)
GLUCOSE: 74 mg/dL (ref 70–99)
Potassium: 3.6 mEq/L (ref 3.5–5.1)
Sodium: 133 mEq/L — ABNORMAL LOW (ref 135–145)

## 2014-12-25 LAB — TSH: TSH: 1.76 u[IU]/mL (ref 0.35–4.50)

## 2014-12-25 NOTE — Patient Instructions (Addendum)
Thank you for choosing [facility].  Summary/Instructions:  Please stop by the lab on the basement level of the building for your blood work. Your results will be released to [facility] (or called to you) after review, usually within 72 hours after test completion. If any changes need to be made, you will be notified at that same time.  If your symptoms worsen or fail to improve, please contact our office for further instruction, or in case of emergency go directly to the emergency room at the closest medical facility.    Health Maintenance A healthy lifestyle and preventative care can promote health and wellness.  Maintain regular health, dental, and eye exams.  Eat a healthy diet. Foods like vegetables, fruits, whole grains, low-fat dairy products, and lean protein foods contain the nutrients you need and are low in calories. Decrease your intake of foods high in solid fats, added sugars, and salt. Get information about a proper diet from your health care provider, if necessary.  Regular physical exercise is one of the most important things you can do for your health. Most adults should get at least 150 minutes of moderate-intensity exercise (any activity that increases your heart rate and causes you to sweat) each week. In addition, most adults need muscle-strengthening exercises on 2 or more days a week.   Maintain a healthy weight. The body mass index (BMI) is a screening tool to identify possible weight problems. It provides an estimate of body fat based on height and weight. Your health care provider can find your BMI and can help you achieve or maintain a healthy weight. For males 20 years and older:  A BMI below 18.5 is considered underweight.  A BMI of 18.5 to 24.9 is normal.  A BMI of 25 to 29.9 is considered overweight.  A BMI of 30 and above is considered obese.  Maintain normal blood lipids and cholesterol by exercising and minimizing your intake of saturated fat. Eat a  balanced diet with plenty of fruits and vegetables. Blood tests for lipids and cholesterol should begin at age 23 and be repeated every 5 years. If your lipid or cholesterol levels are high, you are over age 29, or you are at high risk for heart disease, you may need your cholesterol levels checked more frequently.Ongoing high lipid and cholesterol levels should be treated with medicines if diet and exercise are not working.  If you smoke, find out from your health care provider how to quit. If you do not use tobacco, do not start.  Lung cancer screening is recommended for adults aged 6-80 years who are at high risk for developing lung cancer because of a history of smoking. A yearly low-dose CT scan of the lungs is recommended for people who have at least a 30-pack-year history of smoking and are current smokers or have quit within the past 15 years. A pack year of smoking is smoking an average of 1 pack of cigarettes a day for 1 year (for example, a 30-pack-year history of smoking could mean smoking 1 pack a day for 30 years or 2 packs a day for 15 years). Yearly screening should continue until the smoker has stopped smoking for at least 15 years. Yearly screening should be stopped for people who develop a health problem that would prevent them from having lung cancer treatment.  If you choose to drink alcohol, do not have more than 2 drinks per day. One drink is considered to be 12 oz (360 mL) of  beer, 5 oz (150 mL) of wine, or 1.5 oz (45 mL) of liquor.  Avoid the use of street drugs. Do not share needles with anyone. Ask for help if you need support or instructions about stopping the use of drugs.  High blood pressure causes heart disease and increases the risk of stroke. Blood pressure should be checked at least every 1-2 years. Ongoing high blood pressure should be treated with medicines if weight loss and exercise are not effective.  If you are 66-10 years old, ask your health care provider if  you should take aspirin to prevent heart disease.  Diabetes screening involves taking a blood sample to check your fasting blood sugar level. This should be done once every 3 years after age 24 if you are at a normal weight and without risk factors for diabetes. Testing should be considered at a younger age or be carried out more frequently if you are overweight and have at least 1 risk factor for diabetes.  Colorectal cancer can be detected and often prevented. Most routine colorectal cancer screening begins at the age of 64 and continues through age 52. However, your health care provider may recommend screening at an earlier age if you have risk factors for colon cancer. On a yearly basis, your health care provider may provide home test kits to check for hidden blood in the stool. A small camera at the end of a tube may be used to directly examine the colon (sigmoidoscopy or colonoscopy) to detect the earliest forms of colorectal cancer. Talk to your health care provider about this at age 76 when routine screening begins. A direct exam of the colon should be repeated every 5-10 years through age 57, unless early forms of precancerous polyps or small growths are found.  People who are at an increased risk for hepatitis B should be screened for this virus. You are considered at high risk for hepatitis B if:  You were born in a country where hepatitis B occurs often. Talk with your health care provider about which countries are considered high risk.  Your parents were born in a high-risk country and you have not received a shot to protect against hepatitis B (hepatitis B vaccine).  You have HIV or AIDS.  You use needles to inject street drugs.  You live with, or have sex with, someone who has hepatitis B.  You are a man who has sex with other men (MSM).  You get hemodialysis treatment.  You take certain medicines for conditions like cancer, organ transplantation, and autoimmune  conditions.  Hepatitis C blood testing is recommended for all people born from [birth year] through 1966 and any individual with known risk factors for hepatitis C.  Healthy men should no longer receive prostate-specific antigen (PSA) blood tests as part of routine cancer screening. Talk to your health care provider about prostate cancer screening.  Testicular cancer screening is not recommended for adolescents or adult males who have no symptoms. Screening includes self-exam, a health care provider exam, and other screening tests. Consult with your health care provider about any symptoms you have or any concerns you have about testicular cancer.  Practice safe sex. Use condoms and avoid high-risk sexual practices to reduce the spread of sexually transmitted infections (STIs).  You should be screened for STIs, including gonorrhea and chlamydia if:  You are sexually active and are younger than 24 years.  You are older than 24 years, and your health care provider tells you that you are  at risk for this type of infection.  Your sexual activity has changed since you were last screened, and you are at an increased risk for chlamydia or gonorrhea. Ask your health care provider if you are at risk.  If you are at risk of being infected with HIV, it is recommended that you take a prescription medicine daily to prevent HIV infection. This is called pre-exposure prophylaxis (PrEP). You are considered at risk if:  You are a man who has sex with other men (MSM).  You are a heterosexual man who is sexually active with multiple partners.  You take drugs by injection.  You are sexually active with a partner who has HIV.  Talk with your health care provider about whether you are at high risk of being infected with HIV. If you choose to begin PrEP, you should first be tested for HIV. You should then be tested every 3 months for as long as you are taking PrEP.  Use sunscreen. Apply sunscreen liberally and  repeatedly throughout the day. You should seek shade when your shadow is shorter than you. Protect yourself by wearing long sleeves, pants, a wide-brimmed hat, and sunglasses year round whenever you are outdoors.  Tell your health care provider of new moles or changes in moles, especially if there is a change in shape or color. Also, tell your health care provider if a mole is larger than the size of a pencil eraser.  A one-time screening for abdominal aortic aneurysm (AAA) and surgical repair of large AAAs by ultrasound is recommended for men aged 54-75 years who are current or former smokers.  Stay current with your vaccines (immunizations). Document Released: 02/18/2008 Document Revised: 08/27/2013 Document Reviewed: 01/17/2011 [facility] Patient Information 2015 [place], [place]. This information is not intended to replace advice given to you by your health care provider. Make sure you discuss any questions you have with your health care provider.

## 2014-12-25 NOTE — Progress Notes (Signed)
Pre visit review using our clinic review tool, if applicable. No additional management support is needed unless otherwise documented below in the visit note. 

## 2014-12-25 NOTE — Assessment & Plan Note (Signed)
Reviewed and updated patient's medical, surgical, family and social history. Medications and allergies were also reviewed. Basic screenings for depression, activities of daily living, hearing, cognition and safety were performed. Provider list was updated and health plan was provided to the patient.  

## 2014-12-25 NOTE — Progress Notes (Signed)
Subjective:    Patient ID: Adam [facility], male    DOB: Mar 03, 1961, 54 y.o.   MRN: [id number]  Chief Complaint  Patient presents with  . CPE    Fasting    HPI:  Adam Marshall is a 54 y.o. male who presents today for an annual wellness visit.   1) Health Maintenance -   Diet - Averages 2 meals per day; Consists of vegetables and baked proteins; Denies caffeine  Exercise - Walks up and down the stairs where he lives; no other structured exercise.  2) Preventative Exams / Immunizations:  Dental -- Due for exam  Vision -- Up to date   Health Maintenance  Topic Date Due  . INFLUENZA VACCINE  04/06/2015  . TETANUS/TDAP  10/20/2018  . COLONOSCOPY  10/21/2019  . HIV Screening  Completed    Immunization History  Administered Date(s) Administered  . Influenza Split 10/20/2010    Allergies  Allergen Reactions  . Penicillins Hives and Rash    Current Outpatient Prescriptions on File Prior to Visit  Medication Sig Dispense Refill  . allopurinol (ZYLOPRIM) 100 MG tablet Take 100 mg by mouth 2 (two) times daily.    Marland Kitchen amLODipine (NORVASC) 10 MG tablet Take 10 mg by mouth daily.    Marland Kitchen aspirin 81 MG chewable tablet Chew 81 mg by mouth daily.    . carvedilol (COREG) 12.5 MG tablet Take 2 tablets (25 mg total) by mouth 2 (two) times daily with a meal. 60 tablet 0  . cyclobenzaprine (FLEXERIL) 5 MG tablet Take 5 mg by mouth 3 (three) times daily as needed for muscle spasms.    . fluticasone (FLONASE) 50 MCG/ACT nasal spray Place 1 spray into the nose daily.     No current facility-administered medications on file prior to visit.    Past Medical History  Diagnosis Date  . Hypertension   . Chronic back pain   . Gout   . Chronic knee pain     left    Past Surgical History  Procedure Laterality Date  . Knee surgery      left    Family History  Problem Relation Age of Onset  . Diabetes Mother   . Diabetes Maternal Grandmother     History   Social  History  . Marital Status: Divorced    Spouse Name: N/A  . Number of Children: 1  . Years of Education: 14   Occupational History  . Disability     Knee / Back   Social History Main Topics  . Smoking status: Current Every Day Smoker -- 0.30 packs/day for 30 years    Types: Cigarettes  . Smokeless tobacco: Never Used  . Alcohol Use: Yes     Comment: Occasional  . Drug Use: No  . Sexual Activity: Not on file   Other Topics Concern  . Not on file   Social History Narrative   Born and raised in [place], [place]. Fun: Stay home and watch TV.    Denies religious beliefs that would effect health care.    Cardiac risk factors: hypertension, male gender and smoking/ tobacco exposure.  Depression Screen  Q1: Over the past two weeks, have you felt down, depressed or hopeless? No  Q2: Over the past two weeks, have you felt little interest or pleasure in doing things? No  Have you lost interest or pleasure in daily life? No  Do you often feel hopeless? No  Do you cry easily over simple  problems? No  Activities of Daily Living In your present state of health, do you have any difficulty performing the following activities?:  Driving? No Managing money?  No Feeding yourself? No Getting from bed to chair? No Climbing a flight of stairs? No Preparing food and eating?: No Bathing or showering? No Getting dressed: No Getting to the toilet? No Using the toilet: No Moving around from place to place: No In the past year have you fallen or had a near fall?:Yes - his knee gave out on him while walking, but has not happened since.    Home Safety Has smoke detector and wears seat belts. No firearms. No excess sun exposure. Are there smokers in your home (other than you)?  No Do you feel safe at home?  Yes  Hearing Difficulties: No Do you often ask people to speak up or repeat themselves? No Do you experience ringing or noises in your ears? No  Do you have difficulty understanding soft or  whispered voices? No    Cognitive Testing  Alert? Yes   Normal Appearance? Yes  Oriented to person? Yes  Place? Yes   Time? Yes  Recall of three objects?  Yes  Can perform simple calculations? Yes  Displays appropriate judgment? Yes  Can read the correct time from a watch face? Yes  Do you feel that you have a problem with memory? No  Do you often misplace items? No   Advanced Directives have been discussed with the patient? Yes  Current Physicians/Providers and Suppliers  1. Terri Piedra, [place] any recent Medical Services you may have received from other than [facility] providers in the past year (date may be approximate).  All answers were reviewed with the patient and necessary referrals were made.   Review of Systems  Constitutional: Denies fever, chills, fatigue, or significant weight gain/loss. HENT: Head: Denies headache or neck pain Ears: Denies changes in hearing, ringing in ears, earache, drainage Nose: Denies discharge, stuffiness, itching, nosebleed, sinus pain Throat: Denies sore throat, hoarseness, dry mouth, sores, thrush Eyes: Denies loss/changes in vision, pain, redness, blurry/double vision, flashing lights Cardiovascular: Denies chest pain/discomfort, tightness, palpitations, shortness of breath with activity, difficulty lying down, swelling, sudden awakening with shortness of breath Respiratory: Denies shortness of breath, cough, sputum production, wheezing Gastrointestinal: Denies dysphasia, heartburn, change in appetite, nausea, change in bowel habits, rectal bleeding, constipation, diarrhea, yellow skin or eyes Genitourinary: Denies frequency, urgency, burning/pain, blood in urine, incontinence, change in urinary strength. Musculoskeletal: Denies muscle/joint pain, stiffness, back pain, redness or swelling of joints, trauma Skin: Denies rashes, lumps, itching, dryness, color changes, or hair/nail changes Neurological: Denies dizziness,  fainting, seizures, weakness, numbness, tingling, tremor Psychiatric - Denies nervousness, stress, depression or memory loss Endocrine: Denies heat or cold intolerance, sweating, frequent urination, excessive thirst, changes in appetite Hematologic: Denies ease of bruising or bleeding     Objective:     BP 144/96 mmHg  Pulse 104  Temp(Src) 98.8 F (37.1 C) (Oral)  Resp 18  Ht 5' 5.5" (1.664 m)  Wt 116 lb (52.617 kg)  BMI 19.00 kg/m2  SpO2 97% Nursing note and vital signs reviewed.  Physical Exam  Constitutional: He is oriented to person, place, and time. He appears well-developed and well-nourished.  HENT:  Head: Normocephalic.  Right Ear: Hearing, tympanic membrane, external ear and ear canal normal.  Left Ear: Hearing, tympanic membrane, external ear and ear canal normal.  Nose: Nose normal.  Mouth/Throat: Uvula is midline,  oropharynx is clear and moist and mucous membranes are normal.  Eyes: Conjunctivae and EOM are normal. Pupils are equal, round, and reactive to light.  Neck: Neck supple. No JVD present. No tracheal deviation present. No thyromegaly present.  Cardiovascular: Normal rate, regular rhythm, normal heart sounds and intact distal pulses.   Pulmonary/Chest: Effort normal and breath sounds normal.  Abdominal: Soft. Bowel sounds are normal. He exhibits no distension and no mass. There is no tenderness. There is no rebound and no guarding.  Musculoskeletal: Normal range of motion. He exhibits no edema or tenderness.  Lymphadenopathy:    He has no cervical adenopathy.  Neurological: He is alert and oriented to person, place, and time. He has normal reflexes. No cranial nerve deficit. He exhibits normal muscle tone. Coordination normal.  Skin: Skin is warm and dry.  Psychiatric: He has a normal mood and affect. His behavior is normal. Judgment and thought content normal.       Assessment & Plan:

## 2014-12-25 NOTE — Assessment & Plan Note (Signed)
1) Anticipatory Guidance: Discussed importance of wearing a seatbelt while driving and not texting while driving; changing batteries in smoke detector at least once annually; wearing suntan lotion when outside; eating a balanced and moderate diet; getting physical activity at least 30 minutes per day.  2) Immunizations / Screenings / Labs:  All immunizations are up-to-date per recommendations. All screenings are up-to-date per recommendations. Obtain CBC, BMET, Lipid profile and TSH.   Overall well exam. Discussed modifiable cardiovascular risk factors including tobacco use and hypertension. Blood pressure continues to remain slightly elevated with current regimen. Continue amlodipine and carvedilol at current dosages and recheck blood pressure in a couple weeks. Discussed importance of quitting smoking and reducing his risk factors for cardiovascular disease and lung disease. Patient is not currently motivated to quit smoking, however may consider it. Follow-up prevention exam in 1 year. Follow-up office visit in 2 weeks for blood pressure were sooner if needed.

## 2014-12-27 ENCOUNTER — Telehealth: Payer: Self-pay | Admitting: Family

## 2014-12-27 DIAGNOSIS — IMO0001 Reserved for inherently not codable concepts without codable children: Secondary | ICD-10-CM

## 2014-12-27 DIAGNOSIS — F1020 Alcohol dependence, uncomplicated: Secondary | ICD-10-CM

## 2014-12-27 NOTE — Telephone Encounter (Signed)
Please inform the patient that his lab work shows his thyroid and cholesterol are within the normal ranges. His white/red blood cells show his platelets which help with his blood clotting are low and that the size of his red blood cells is increased. Therefore I would like for him to start taking Vitamin B-1 (Thiamine) 1.2 mg daily and Folic Acid (4,496 mg daily)  which are both available over the counter. I would also like him to return to the lab at his convenience to check his liver function.

## 2014-12-29 NOTE — Telephone Encounter (Signed)
Spoke with pt about lab results. He was driving and will call back with what he needs to start taking.

## 2014-12-30 NOTE — Telephone Encounter (Signed)
Pt aware of results and what to take.

## 2015-01-08 ENCOUNTER — Ambulatory Visit: Payer: Medicare HMO | Admitting: Family Medicine

## 2015-01-08 DIAGNOSIS — Z0289 Encounter for other administrative examinations: Secondary | ICD-10-CM

## 2015-02-04 ENCOUNTER — Ambulatory Visit (INDEPENDENT_AMBULATORY_CARE_PROVIDER_SITE_OTHER)
Admission: RE | Admit: 2015-02-04 | Discharge: 2015-02-04 | Disposition: A | Discharge status: Home / Self Care | Payer: Medicare HMO | Source: Ambulatory Visit | Attending: Family Medicine | Admitting: Family Medicine

## 2015-02-04 ENCOUNTER — Encounter: Payer: Self-pay | Admitting: Family Medicine

## 2015-02-04 ENCOUNTER — Other Ambulatory Visit (INDEPENDENT_AMBULATORY_CARE_PROVIDER_SITE_OTHER): Payer: Medicare HMO

## 2015-02-04 ENCOUNTER — Ambulatory Visit (INDEPENDENT_AMBULATORY_CARE_PROVIDER_SITE_OTHER): Payer: Medicare HMO | Admitting: Family Medicine

## 2015-02-04 VITALS — BP 98/72 | HR 113 | Ht 65.5 in | Wt 111.0 lb

## 2015-02-04 DIAGNOSIS — M25562 Pain in left knee: Secondary | ICD-10-CM

## 2015-02-04 LAB — SEDIMENTATION RATE: Sed Rate: 18 mm/hr (ref 0–22)

## 2015-02-04 LAB — URIC ACID: URIC ACID, SERUM: 11.1 mg/dL — AB (ref 4.0–7.8)

## 2015-02-04 NOTE — Assessment & Plan Note (Signed)
Patient is having left knee pain that seems to be out of the ordinary. 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 We are going to get x-rays for further evaluation as well as a uric acid level. Due to the exfoliation on his hands as well as the redness of his eyes I do feel that further workup including a sedimentation rate is necessary to rule out any other inflammatory properties. Syphilis is also unfortunately within the differential. We will get these labs to see and make sure nothing else is occurring. Patient given home exercises as well as an icing protocol. Patient given a trial of topical in time point where. Depending on findings we'll discuss and see if any changes in necessary to medications. Patient otherwise will follow-up with me again in 3 weeks for further evaluation. If continuing to have difficulty injection could be necessary.

## 2015-02-04 NOTE — Progress Notes (Signed)
[facility] [place],  [address] Phone: [phone] Subjective:    I'm seeing this patient by the request  of:  [name], FNP   CC: Knee pain  Adam Marshall is a 54 y.o. male coming in with complaint of knee pain. This is left-sided. Patient is had this pain for quite some time and has had a past medical history significant for surgery of this knee previously. Patient describes pain as more of a dull aching sensation that seems to be worse in the morning with tightness. Patient states that he is been monitoring his gout for a long time and sometimes when he eats certain foods this joint can hurt more. Patient denies any locking or giving out on him but seems to be worse when going up or down stairs. Patient rates the severity of pain as 5 out of 10. States that he can be as bad as 9 out of 10. Denies any nighttime awakening, denies any radiation down the leg or any numbness or tingling.  Past Medical History  Diagnosis Date  . Hypertension   . Chronic back pain   . Gout   . Chronic knee pain     left   Past Surgical History  Procedure Laterality Date  . Knee surgery      left   History  Substance Use Topics  . Smoking status: Current Every Day Smoker -- 0.30 packs/day for 30 years    Types: Cigarettes  . Smokeless tobacco: Never Used  . Alcohol Use: Yes     Comment: Occasional   Allergies  Allergen Reactions  . Penicillins Hives and Rash   Family History  Problem Relation Age of Onset  . Diabetes Mother   . Diabetes Maternal Grandmother        Past medical history, social, surgical and family history all reviewed in electronic medical record.   Review of Systems: No headache, visual changes, nausea, vomiting, diarrhea, constipation, dizziness, abdominal pain, skin rash, fevers, chills, night sweats, weight loss, swollen lymph nodes, body aches, joint swelling, muscle aches, chest pain,  shortness of breath, mood changes.   Objective Blood pressure 98/72, pulse 113, height 5' 5.5" (1.664 m), weight 111 lb (50.349 kg), SpO2 96 %.  General: Severely contacted HEENT: Scleritis noted.  Respiratory: Patient's speak in full sentences and does not appear short of breath  Cardiovascular: No lower extremity edema, non tender, no erythema  Skin: Patient does have significant redness of the palms as well as exfoliation of the skin. Abdomen: Soft nontender  Neuro: Cranial nerves II through XII are intact, neurovascularly intact in all extremities with 2+ DTRs and 2+ pulses.  Lymph: No lymphadenopathy of posterior or anterior cervical chain or axillae bilaterally.  Gait normal with good balance and coordination.  MSK:  Non tender with full range of motion and good stability and symmetric strength and tone of shoulders, elbows, wrist, hip, and ankles bilaterally.  Knee: Left Significant atrophy of the musculature of the legs bilaterally. Tender to palpation over the medial joint line ROM full in flexion and extension and lower leg rotation. Ligaments with solid consistent endpoints including ACL, PCL, LCL, MCL. Negative Mcmurray's, Apley's, and Thessalonian tests. Mild painful patellar compression. Patellar glide without crepitus. Patellar and quadriceps tendons unremarkable. Hamstring and quadriceps strength is normal.  Contralateral knee unremarkable  MSK US performed of: Left This study was ordered, performed, and interpreted by Charlann Boxer D.O.  Knee: All structures  visualized. Medial meniscus does have post surgical changes as well as some calcific first uric acid deposits noted within the remaining meniscus. Moderate narrowing of the joint space. Patellar Tendon unremarkable on long and transverse views without effusion. No abnormality of prepatellar bursa. LCL and MCL unremarkable on long and transverse views. No abnormality of origin of medial or lateral head of the  gastrocnemius.  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  Procedure note 68088; 15 minutes spent for Therapeutic exercises as stated in above notes.  This included exercises focusing on stretching, strengthening, with significant focus on eccentric aspects.  Patient in flexion and extension exercises as well as patella femoral tracking. Focus on vastus medialis oblique as well as hip abductor exercises. Proper technique shown and discussed handout in great detail with ATC.  All questions were discussed and answered.     Impression and Recommendations:     This case required medical decision making of moderate complexity.

## 2015-02-04 NOTE — Progress Notes (Signed)
Pre visit review using our clinic review tool, if applicable. No additional management support is needed unless otherwise documented below in the visit note. 

## 2015-02-04 NOTE — Patient Instructions (Addendum)
Good to see you.  Lets get xray downstairs and labs Ice 20 minutes 2 times daily. Usually after activity and before bed. Exercises 3 times a week.  pennsaid pinkie amount topically 2 times daily as needed.  Turmeric 500mg  twice daily Vitamin D 2000 IU daily

## 2015-02-05 LAB — HIV ANTIBODY (ROUTINE TESTING W REFLEX): HIV 1&2 Ab, 4th Generation: NONREACTIVE

## 2015-02-05 LAB — RPR

## 2015-02-05 LAB — ANA: Anti Nuclear Antibody(ANA): NEGATIVE

## 2015-02-05 MED ORDER — ALLOPURINOL 300 MG PO TABS: 300.0000 mg | ORAL_TABLET | Freq: Every day | ORAL | Status: DC

## 2015-02-16 ENCOUNTER — Telehealth: Payer: Self-pay | Admitting: Family

## 2015-02-16 NOTE — Telephone Encounter (Signed)
Discussed with pt

## 2015-02-16 NOTE — Telephone Encounter (Signed)
Patient was prescribed allopurinol (ZYLOPRIM) 300 MG tablet [[id number] and patient is getting shakes and has fallen down. Patient wondering if he should stop medicine. Please advise patient

## 2015-02-16 NOTE — Telephone Encounter (Signed)
He can stop and see if her gets better. Stop for 1 week but if not better need to be seen and we will need to do another gout medicine.

## 2015-02-23 ENCOUNTER — Telehealth: Payer: Self-pay | Admitting: Family

## 2015-02-23 NOTE — Telephone Encounter (Signed)
[place] called (ok to speak with per DPR) she has some questions about pt Gout meds and how he should be taking them??     Best number [phone]

## 2015-02-23 NOTE — Telephone Encounter (Signed)
Discussed with pt

## 2015-02-23 NOTE — Telephone Encounter (Signed)
What were the side effects?   I would do the medicine again if it helps the gout and if he can tolerate it.

## 2015-02-23 NOTE — Telephone Encounter (Signed)
Spoke to Ms. Romilda Garret & she stated that the pt stopped taking the allopurinol for 1 week like instructed & the pt's gout has started to come back. They want to know how the pt should go back to taking allopurinol. The pt previously had side effects to the medication.

## 2015-03-04 ENCOUNTER — Ambulatory Visit (INDEPENDENT_AMBULATORY_CARE_PROVIDER_SITE_OTHER): Payer: Medicare HMO | Admitting: Family Medicine

## 2015-03-04 ENCOUNTER — Encounter: Payer: Self-pay | Admitting: Family Medicine

## 2015-03-04 VITALS — BP 118/82 | HR 99 | Ht 65.5 in | Wt 113.0 lb

## 2015-03-04 DIAGNOSIS — M25562 Pain in left knee: Secondary | ICD-10-CM

## 2015-03-04 DIAGNOSIS — M109 Gout, unspecified: Secondary | ICD-10-CM | POA: Diagnosis not present

## 2015-03-04 MED ORDER — COLCHICINE 0.6 MG PO TABS: 0.6000 mg | ORAL_TABLET | Freq: Every day | ORAL | Status: DC

## 2015-03-04 MED ORDER — ALLOPURINOL 100 MG PO TABS: 100.0000 mg | ORAL_TABLET | Freq: Every day | ORAL | Status: DC

## 2015-03-04 NOTE — Progress Notes (Signed)
  [facility] [place], [place] [address] Phone: [phone] Subjective:      CC: Knee pain, follow up   Adam Marshall is a 54 y.o. male coming in with complaint of knee pain. This is left-sided. Patient was seen previously and had more of uric acid deposits. Patient has had difficulty tolerating the high-dose allopurinol and has had some falls recently. She continues to have the knee pain. Has not made any other significant changes. Patient did have uric acid level of 11.2. States that he has some instability of the knee and has fallen a couple times.  Past Medical History  Diagnosis Date  . Hypertension   . Chronic back pain   . Gout   . Chronic knee pain     left   Past Surgical History  Procedure Laterality Date  . Knee surgery      left   History  Substance Use Topics  . Smoking status: Current Every Day Smoker -- 0.30 packs/day for 30 years    Types: Cigarettes  . Smokeless tobacco: Never Used  . Alcohol Use: Yes     Comment: Occasional   Allergies  Allergen Reactions  . Penicillins Hives and Rash   Family History  Problem Relation Age of Onset  . Diabetes Mother   . Diabetes Maternal Grandmother        Past medical history, social, surgical and family history all reviewed in electronic medical record.   Review of Systems: No headache, visual changes, nausea, vomiting, diarrhea, constipation, dizziness, abdominal pain, skin rash, fevers, chills, night sweats, weight loss, swollen lymph nodes, body aches, joint swelling, muscle aches, chest pain, shortness of breath, mood changes.   Objective Blood pressure 118/82, pulse 99, height 5' 5.5" (1.664 m), weight 113 lb (51.256 kg), SpO2 96 %.  General: Severely contacted HEENT: Scleritis noted.  Respiratory: Patient's speak in full sentences and does not appear short of breath  Cardiovascular: No lower extremity edema, non tender, no erythema  Skin:  Patient does have significant redness of the palms as well as exfoliation of the skin. Abdomen: Soft nontender  Neuro: Cranial nerves II through XII are intact, neurovascularly intact in all extremities with 2+ DTRs and 2+ pulses.  Lymph: No lymphadenopathy of posterior or anterior cervical chain or axillae bilaterally.  Gait normal with good balance and coordination.  MSK:  Non tender with full range of motion and good stability and symmetric strength and tone of shoulders, elbows, wrist, hip, and ankles bilaterally.  Knee: Left Significant atrophy of the musculature of the legs bilaterally. Tender to palpation over the medial joint linestill noting minorly less than previous exam. ROM full in flexion and extension and lower leg rotation. Ligaments with solid consistent endpoints including ACL, PCL, LCL, MCL. Negative Mcmurray's, Apley's, and Thessalonian tests. Mild painful patellar compression. Patellar glide without crepitus. Patellar and quadriceps tendons unremarkable. Hamstring and quadriceps strength is normal.  Contralateral knee unremarkable About the same as previously.   procedure After informed written and verbal consent, patient was seated on exam table. Left knee was prepped with alcohol swab and utilizing anterolateral approach, patient's left knee space was injected with 4:1  marcaine 0.5%: Kenalog 40mg /dL. Patient tolerated the procedure well without immediate complications.    Impression and Recommendations:     This case required medical decision making of moderate complexity.

## 2015-03-04 NOTE — Assessment & Plan Note (Signed)
Injection today. Adam Marshall that this will be beneficial. We discussed icing regimen as well as trying to stay active. I do not feel that further imaging is warranted at this time.

## 2015-03-04 NOTE — Assessment & Plan Note (Signed)
Patient states that overall he continues to have pain. Patient will try low dose of allopurinolpatient will at a low dose of colchicine. We'll see if patient can tolerate this. Discussed with patient again the importance of diet. Discussed the importance of avoiding alcohol. Patient is, try to make these different changes and come back and see me again in 3 weeks.

## 2015-03-04 NOTE — Progress Notes (Signed)
Pre visit review using our clinic review tool, if applicable. No additional management support is needed unless otherwise documented below in the visit note. 

## 2015-03-04 NOTE — Patient Instructions (Addendum)
Good to see you This is your gout and we have to lower your uric acid Lets keep you on a low dose of the allopurinol 100mg  daily.  Cochicine daily new medicine Monitor your diet and see if there are any triggers.  Tart cherry extract at night can help See me again in 3-4 weeks ot make sure you are better

## 2015-04-09 ENCOUNTER — Telehealth: Payer: Self-pay | Admitting: *Deleted

## 2015-04-09 MED ORDER — AMLODIPINE BESYLATE 10 MG PO TABS: 10.0000 mg | ORAL_TABLET | Freq: Every day | ORAL | Status: DC

## 2015-04-09 NOTE — Telephone Encounter (Signed)
Received call pt is needing refill on amlodipine. Verified pharmacy inform pt will send to [facility]...Johny Chess

## 2015-05-29 ENCOUNTER — Encounter: Payer: Medicare HMO | Admitting: Family

## 2015-10-06 ENCOUNTER — Other Ambulatory Visit: Payer: Self-pay

## 2015-10-06 MED ORDER — AMLODIPINE BESYLATE 10 MG PO TABS: 10.0000 mg | ORAL_TABLET | Freq: Every day | ORAL | Status: DC

## 2015-10-13 ENCOUNTER — Other Ambulatory Visit (INDEPENDENT_AMBULATORY_CARE_PROVIDER_SITE_OTHER): Payer: Medicare HMO

## 2015-10-13 ENCOUNTER — Ambulatory Visit (INDEPENDENT_AMBULATORY_CARE_PROVIDER_SITE_OTHER): Payer: Medicare HMO | Admitting: Internal Medicine

## 2015-10-13 ENCOUNTER — Encounter: Payer: Self-pay | Admitting: Internal Medicine

## 2015-10-13 VITALS — BP 110/72 | HR 121 | Temp 98.3°F | Resp 20 | Ht 65.5 in | Wt 111.0 lb

## 2015-10-13 DIAGNOSIS — R945 Abnormal results of liver function studies: Secondary | ICD-10-CM

## 2015-10-13 DIAGNOSIS — M25562 Pain in left knee: Secondary | ICD-10-CM

## 2015-10-13 DIAGNOSIS — R7989 Other specified abnormal findings of blood chemistry: Secondary | ICD-10-CM

## 2015-10-13 DIAGNOSIS — Z789 Other specified health status: Secondary | ICD-10-CM | POA: Diagnosis not present

## 2015-10-13 DIAGNOSIS — I10 Essential (primary) hypertension: Secondary | ICD-10-CM | POA: Diagnosis not present

## 2015-10-13 DIAGNOSIS — Z7289 Other problems related to lifestyle: Secondary | ICD-10-CM

## 2015-10-13 LAB — URINALYSIS, ROUTINE W REFLEX MICROSCOPIC
Ketones, ur: 15 — AB
Nitrite: NEGATIVE
Specific Gravity, Urine: 1.025 (ref 1.000–1.030)
TOTAL PROTEIN, URINE-UPE24: 30 — AB
URINE GLUCOSE: NEGATIVE
pH: 6 (ref 5.0–8.0)

## 2015-10-13 LAB — CBC WITH DIFFERENTIAL/PLATELET
BASOS ABS: 0 10*3/uL (ref 0.0–0.1)
BASOS PCT: 0.1 % (ref 0.0–3.0)
EOS PCT: 0.3 % (ref 0.0–5.0)
Eosinophils Absolute: 0 10*3/uL (ref 0.0–0.7)
HEMATOCRIT: 34.4 % — AB (ref 39.0–52.0)
Hemoglobin: 11.4 g/dL — ABNORMAL LOW (ref 13.0–17.0)
LYMPHS PCT: 11.9 % — AB (ref 12.0–46.0)
Lymphs Abs: 0.7 10*3/uL (ref 0.7–4.0)
MCHC: 33.2 g/dL (ref 30.0–36.0)
MCV: 103 fl — AB (ref 78.0–100.0)
MONOS PCT: 8.7 % (ref 3.0–12.0)
Monocytes Absolute: 0.5 10*3/uL (ref 0.1–1.0)
NEUTROS ABS: 4.5 10*3/uL (ref 1.4–7.7)
Neutrophils Relative %: 79 % — ABNORMAL HIGH (ref 43.0–77.0)
PLATELETS: 57 10*3/uL — AB (ref 150.0–400.0)
RBC: 3.34 Mil/uL — ABNORMAL LOW (ref 4.22–5.81)
RDW: 15.9 % — ABNORMAL HIGH (ref 11.5–15.5)
WBC: 5.7 10*3/uL (ref 4.0–10.5)

## 2015-10-13 LAB — PROTIME-INR
INR: 1.1 ratio — AB (ref 0.8–1.0)
PROTHROMBIN TIME: 12 s (ref 9.6–13.1)

## 2015-10-13 MED ORDER — MELOXICAM 15 MG PO TABS: 15.0000 mg | ORAL_TABLET | Freq: Every day | ORAL | Status: DC | PRN

## 2015-10-13 MED ORDER — CYCLOBENZAPRINE HCL 5 MG PO TABS: 5.0000 mg | ORAL_TABLET | Freq: Three times a day (TID) | ORAL | Status: DC | PRN

## 2015-10-13 NOTE — Progress Notes (Signed)
Pre visit review using our clinic review tool, if applicable. No additional management support is needed unless otherwise documented below in the visit note. 

## 2015-10-13 NOTE — Assessment & Plan Note (Signed)
stable overall by history and exam, recent data reviewed with pt, and pt to continue medical treatment as before,  to f/u any worsening symptoms or concerns BP Readings from Last 3 Encounters:  10/13/15 110/72  03/04/15 118/82  02/04/15 98/72

## 2015-10-13 NOTE — Progress Notes (Signed)
Subjective:    Patient ID: Adam Marshall, male    DOB: July 23, 1961, 55 y.o.   MRN: [id number]  HPI  Here to f/u, after recently seen per podiatry, apparently had labs with abnormal liver enzymes per wife, aks for [place] to get records and for any further evaluatoin.  Pt has hx of elevated LFT's for several yrs, last checked 2013 on the same day his ETOH level was 147.  Still drinks 2 beers per night.  No hx of overt dependence or withdrawal, though has also had chronic pancreatitis by EMR listed issues.  No hx of other posthepatic problems.  No hx of rheum dz, ANA negative June 2016, as well as HIV.  Denies worsening reflux, abd pain, dysphagia, n/v, bowel change or blood.  Pt denies chest pain, increased sob or doe, wheezing, orthopnea, PND, increased LE swelling, palpitations, dizziness or syncope.  Pt denies new neurological symptoms such as new headache, or facial or extremity weakness or numbness   Pt denies polydipsia, polyuria,but does have nocturia x 2-3.  No recent UA.  Last imagine 2012: Abd u/s: MPRESSION: Normal sonographic appearance of the gallbladder.  Pancreatic body/tail is mildly hypoechoic but normal in size, possibly sequela of prior/chronic pancreatitis.  Small cystic lesions in the region of the pancreatic head, measuring up to 13 mm, possibly reflecting small pseudocysts.  Original Report Authenticated By: Julian Hy, M.D. Pt continues to have recurring LBP without change in severity, bowel or bladder change, fever, wt loss,  worsening LE pain/numbness/weakness, gait change or falls. Past Medical History  Diagnosis Date  . Hypertension   . Chronic back pain   . Gout   . Chronic knee pain     left   Past Surgical History  Procedure Laterality Date  . Knee surgery      left    reports that he has been smoking Cigarettes.  He has a 9 pack-year smoking history. He has never used smokeless tobacco. He reports that he drinks alcohol. He reports that he does not  use illicit drugs. family history includes Diabetes in his maternal grandmother and mother. Allergies  Allergen Reactions  . Penicillins Hives and Rash   Current Outpatient Prescriptions on File Prior to Visit  Medication Sig Dispense Refill  . allopurinol (ZYLOPRIM) 100 MG tablet Take 1 tablet (100 mg total) by mouth daily. 30 tablet 1  . amLODipine (NORVASC) 10 MG tablet Take 1 tablet (10 mg total) by mouth daily. 90 tablet 0  . aspirin 81 MG chewable tablet Chew 81 mg by mouth daily.    . carvedilol (COREG) 12.5 MG tablet Take 2 tablets (25 mg total) by mouth 2 (two) times daily with a meal. 60 tablet 0  . colchicine 0.6 MG tablet Take 1 tablet (0.6 mg total) by mouth daily. 30 tablet 2  . cyclobenzaprine (FLEXERIL) 5 MG tablet Take 5 mg by mouth 3 (three) times daily as needed for muscle spasms.    . fluticasone (FLONASE) 50 MCG/ACT nasal spray Place 1 spray into the nose daily.     No current facility-administered medications on file prior to visit.     Review of Systems  Constitutional: Negative for unusual diaphoresis or night sweats HENT: Negative for ringing in ear or discharge Eyes: Negative for double vision or worsening visual disturbance.  Respiratory: Negative for choking and stridor.   Gastrointestinal: Negative for vomiting or other signifcant bowel change Genitourinary: Negative for hematuria or change in urine volume.  Musculoskeletal: Negative for  other MSK pain or swelling Skin: Negative for color change and worsening wound.  Neurological: Negative for tremors and numbness other than noted  Psychiatric/Behavioral: Negative for decreased concentration or agitation other than above       Objective:   Physical Exam BP 110/72 mmHg  Pulse 121  Temp(Src) 98.3 F (36.8 C) (Oral)  Resp 20  Ht 5' 5.5" (1.664 m)  Wt 111 lb (50.349 kg)  BMI 18.18 kg/m2  SpO2 97% VS noted,  Constitutional: Pt appears in no significant distress HENT: Head: NCAT.  Right Ear:  External ear normal.  Left Ear: External ear normal.  Eyes: . Pupils are equal, round, and reactive to light. Conjunctivae and EOM are normal Neck: Normal range of motion. Neck supple.  Cardiovascular: Normal rate and regular rhythm.   Pulmonary/Chest: Effort normal and breath sounds without rales or wheezing.  Abd:  Soft, NT, ND, + BS - benign Neurological: Pt is alert. Not confused , motor grossly intact Skin: Skin is warm. No rash, no LE edema, no spider angioma or other stigmata Psychiatric: Pt behavior is normal. No agitation. somber in demeanor MSK: tender low mid lumbar spine without sweling or rash Lab Results  Component Value Date   WBC 6.3 12/25/2014   HGB 12.8* 12/25/2014   HCT 37.6* 12/25/2014   PLT 79.0* 12/25/2014   GLUCOSE 74 12/25/2014   CHOL 189 12/25/2014   TRIG 102.0 12/25/2014   HDL 92.70 12/25/2014   LDLCALC 76 12/25/2014   ALT 37 02/24/2012   AST 120* 02/24/2012   NA 133* 12/25/2014   K 3.6 12/25/2014   CL 91* 12/25/2014   CREATININE 0.71 12/25/2014   BUN 7 12/25/2014   CO2 24 12/25/2014   TSH 1.76 12/25/2014   HGBA1C 5.2 05/02/2011    Hepatitis, Acute  Status: Edited Visible to patient:  Not Released Nextappt: None Dx:  Nausea and vomiting; Elevated LFTs         Ref Range 55yr ago    Hepatitis B Surface Ag NEGATIVE  NEGATIVE   Hep B C IgM NEGATIVE  NEG   Comments: High levels of Hepatitis B Core IgM antibody are detectable during the acute stage of Hepatitis B. This antibody is used to differentiate current from past HBV infection.    Hep A IgM NEGATIVE  NEG   HCV Ab NEGATIVE  NEGATIVE   Resulting Agency [facility]             Assessment & Plan:

## 2015-10-13 NOTE — Patient Instructions (Addendum)
Please take all new medication as prescribed - the antiinflammatory and muscle relaxer as needed  Please continue all other medications as before, and refills have been done if requested.  Please have the pharmacy call with any other refills you may need.  Please continue your efforts at being more active, low cholesterol diet, and weight control.  You are otherwise up to date with prevention measures today.  Please keep your appointments with your specialists as you may have planned  You will be contacted regarding the referral for: abdomen ultrasound of the liver  Please go to the LAB in the Basement (turn left off the elevator) for the tests to be done today  You will be contacted by phone if any changes need to be made immediately.  Otherwise, you will receive a letter about your results with an explanation, but please check with MyChart first.  Please remember to sign up for MyChart if you have not done so, as this will be important to you in the future with finding out test results, communicating by private email, and scheduling acute appointments online when needed.  Please return at your conveneince to Adam Piedra NP for your physical, or sooner if needed

## 2015-10-13 NOTE — Assessment & Plan Note (Signed)
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

## 2015-10-13 NOTE — Assessment & Plan Note (Addendum)
Has been an ongoing issue, mild persistent I suspect related to ongoing ETOH use, not likely med related but cant other -  but will get records per podiatry, for repeat labs today, abd u/s and consider GI referral,  to f/u any worsening symptoms or concerns

## 2015-10-14 LAB — HEPATIC FUNCTION PANEL
ALT: 62 U/L — ABNORMAL HIGH (ref 0–53)
AST: 263 U/L — ABNORMAL HIGH (ref 0–37)
Albumin: 4.2 g/dL (ref 3.5–5.2)
Alkaline Phosphatase: 512 U/L — ABNORMAL HIGH (ref 39–117)
BILIRUBIN DIRECT: 1.2 mg/dL — AB (ref 0.0–0.3)
BILIRUBIN TOTAL: 2.1 mg/dL — AB (ref 0.2–1.2)
TOTAL PROTEIN: 8 g/dL (ref 6.0–8.3)

## 2015-10-14 LAB — BASIC METABOLIC PANEL
BUN: 8 mg/dL (ref 6–23)
BUN: 8 mg/dL (ref 6–23)
CALCIUM: 10 mg/dL (ref 8.4–10.5)
CO2: 25 mEq/L (ref 19–32)
CO2: 26 meq/L (ref 19–32)
CREATININE: 0.69 mg/dL (ref 0.40–1.50)
Calcium: 9.9 mg/dL (ref 8.4–10.5)
Chloride: 95 mEq/L — ABNORMAL LOW (ref 96–112)
Chloride: 94 mEq/L — ABNORMAL LOW (ref 96–112)
Creatinine, Ser: 0.65 mg/dL (ref 0.40–1.50)
GFR: 163.67 mL/min (ref >60.00)
GFR: 152.77 mL/min (ref >60.00)
GLUCOSE: 96 mg/dL (ref 70–99)
Glucose, Bld: 102 mg/dL — ABNORMAL HIGH (ref 70–99)
POTASSIUM: 4.1 meq/L (ref 3.5–5.1)
Potassium: 4.1 mEq/L (ref 3.5–5.1)
SODIUM: 136 meq/L (ref 135–145)
SODIUM: 138 meq/L (ref 135–145)

## 2015-10-14 LAB — LIPASE: Lipase: 85 U/L — ABNORMAL HIGH (ref 11.0–59.0)

## 2015-10-14 LAB — PSA: PSA: 7.61 ng/mL — ABNORMAL HIGH (ref 0.10–4.00)

## 2015-10-14 LAB — LIPID PANEL
CHOL/HDL RATIO: 2
Cholesterol: 197 mg/dL (ref 0–200)
HDL: 82.6 mg/dL (ref >39.00)
LDL CALC: 88 mg/dL (ref 0–99)
NONHDL: 114.55
Triglycerides: 131 mg/dL (ref 0.0–149.0)
VLDL: 26.2 mg/dL (ref 0.0–40.0)

## 2015-10-14 LAB — HEPATITIS PANEL, ACUTE
HCV AB: NEGATIVE
HEP A IGM: NONREACTIVE
HEP B C IGM: NONREACTIVE
Hepatitis B Surface Ag: NEGATIVE

## 2015-10-14 LAB — TSH: TSH: 3.3 u[IU]/mL (ref 0.35–4.50)

## 2015-10-15 ENCOUNTER — Other Ambulatory Visit: Payer: Self-pay | Admitting: Internal Medicine

## 2015-10-15 ENCOUNTER — Encounter: Payer: Self-pay | Admitting: Internal Medicine

## 2015-10-15 DIAGNOSIS — R972 Elevated prostate specific antigen [PSA]: Secondary | ICD-10-CM

## 2015-10-15 DIAGNOSIS — R945 Abnormal results of liver function studies: Secondary | ICD-10-CM

## 2015-10-15 DIAGNOSIS — R7989 Other specified abnormal findings of blood chemistry: Secondary | ICD-10-CM

## 2015-10-20 ENCOUNTER — Ambulatory Visit
Admission: RE | Admit: 2015-10-20 | Discharge: 2015-10-20 | Disposition: A | Discharge status: Home / Self Care | Payer: Medicare HMO | Source: Ambulatory Visit | Attending: Internal Medicine | Admitting: Internal Medicine

## 2015-10-20 ENCOUNTER — Encounter: Payer: Self-pay | Admitting: Internal Medicine

## 2015-10-20 DIAGNOSIS — Z789 Other specified health status: Secondary | ICD-10-CM

## 2015-10-20 DIAGNOSIS — R945 Abnormal results of liver function studies: Secondary | ICD-10-CM

## 2015-10-20 DIAGNOSIS — R7989 Other specified abnormal findings of blood chemistry: Secondary | ICD-10-CM

## 2015-10-20 DIAGNOSIS — Z7289 Other problems related to lifestyle: Secondary | ICD-10-CM

## 2015-10-20 DIAGNOSIS — I10 Essential (primary) hypertension: Secondary | ICD-10-CM

## 2015-10-23 ENCOUNTER — Telehealth: Payer: Self-pay | Admitting: Internal Medicine

## 2015-10-23 NOTE — Telephone Encounter (Signed)
Is calling to get lab results

## 2015-10-27 ENCOUNTER — Encounter: Payer: Self-pay | Admitting: Internal Medicine

## 2015-12-16 ENCOUNTER — Ambulatory Visit: Payer: Medicare HMO | Admitting: Internal Medicine

## 2016-01-12 ENCOUNTER — Telehealth: Payer: Self-pay

## 2016-01-12 NOTE — Telephone Encounter (Signed)
Patient is on my [employer] list for 2017. Patient has an appt scheduled for 01/2016 with PCP

## 2016-01-13 ENCOUNTER — Encounter: Payer: Self-pay | Admitting: Family Medicine

## 2016-01-13 ENCOUNTER — Ambulatory Visit (INDEPENDENT_AMBULATORY_CARE_PROVIDER_SITE_OTHER): Payer: Medicare HMO | Admitting: Family Medicine

## 2016-01-13 ENCOUNTER — Other Ambulatory Visit (INDEPENDENT_AMBULATORY_CARE_PROVIDER_SITE_OTHER): Payer: Medicare HMO

## 2016-01-13 ENCOUNTER — Ambulatory Visit (INDEPENDENT_AMBULATORY_CARE_PROVIDER_SITE_OTHER): Payer: Medicare HMO | Admitting: Family

## 2016-01-13 ENCOUNTER — Encounter: Payer: Self-pay | Admitting: Family

## 2016-01-13 VITALS — BP 118/82 | HR 94 | Temp 98.3°F | Resp 16 | Ht 65.5 in | Wt 107.0 lb

## 2016-01-13 VITALS — BP 118/82 | HR 94 | Ht 65.5 in | Wt 107.0 lb

## 2016-01-13 DIAGNOSIS — Z0000 Encounter for general adult medical examination without abnormal findings: Secondary | ICD-10-CM

## 2016-01-13 DIAGNOSIS — R945 Abnormal results of liver function studies: Secondary | ICD-10-CM

## 2016-01-13 DIAGNOSIS — R7989 Other specified abnormal findings of blood chemistry: Secondary | ICD-10-CM

## 2016-01-13 DIAGNOSIS — R6889 Other general symptoms and signs: Secondary | ICD-10-CM | POA: Diagnosis not present

## 2016-01-13 DIAGNOSIS — M109 Gout, unspecified: Secondary | ICD-10-CM | POA: Diagnosis not present

## 2016-01-13 DIAGNOSIS — Z789 Other specified health status: Secondary | ICD-10-CM | POA: Diagnosis not present

## 2016-01-13 DIAGNOSIS — L02211 Cutaneous abscess of abdominal wall: Secondary | ICD-10-CM | POA: Diagnosis not present

## 2016-01-13 DIAGNOSIS — M25562 Pain in left knee: Secondary | ICD-10-CM

## 2016-01-13 DIAGNOSIS — Z7289 Other problems related to lifestyle: Secondary | ICD-10-CM

## 2016-01-13 DIAGNOSIS — R252 Cramp and spasm: Secondary | ICD-10-CM | POA: Diagnosis not present

## 2016-01-13 DIAGNOSIS — Z0001 Encounter for general adult medical examination with abnormal findings: Secondary | ICD-10-CM | POA: Diagnosis not present

## 2016-01-13 LAB — HEPATIC FUNCTION PANEL
ALBUMIN: 4.2 g/dL (ref 3.5–5.2)
ALK PHOS: 605 U/L — AB (ref 39–117)
ALT: 45 U/L (ref 0–53)
AST: 247 U/L — ABNORMAL HIGH (ref 0–37)
Bilirubin, Direct: 1.7 mg/dL — ABNORMAL HIGH (ref 0.0–0.3)
TOTAL PROTEIN: 7.7 g/dL (ref 6.0–8.3)
Total Bilirubin: 2.9 mg/dL — ABNORMAL HIGH (ref 0.2–1.2)

## 2016-01-13 LAB — B12 AND FOLATE PANEL
FOLATE: 5.7 ng/mL — AB (ref >5.9)
VITAMIN B 12: 325 pg/mL (ref 211–911)

## 2016-01-13 MED ORDER — MUPIROCIN CALCIUM 2 % EX CREA: 1.0000 "application " | TOPICAL_CREAM | Freq: Two times a day (BID) | CUTANEOUS | Status: DC

## 2016-01-13 NOTE — Assessment & Plan Note (Signed)
Abscess of skin noted of lower abdomen most consistent with a ruptured cyst. Start mupirocin. Encouraged basic wound care to keep the wound clean and dry. Follow-up for any signs of infection.

## 2016-01-13 NOTE — Progress Notes (Signed)
Pre visit review using our clinic review tool, if applicable. No additional management support is needed unless otherwise documented below in the visit note. 

## 2016-01-13 NOTE — Assessment & Plan Note (Signed)
Previous labs with elevated liver function tests and continue use of alcohol. Obtain hepatic panel. Encouraged to decrease alcohol intake to preserve liver function and prevent cirrhosis development.

## 2016-01-13 NOTE — Progress Notes (Signed)
[facility] [address], [place] [address] Phone: [phone] Subjective:      CC: Knee pain, follow up   [id number] Adam Marshall is a 55 y.o. male coming in with complaint of knee pain. This is left-sided. Patient was seen previously and had more of uric acid deposits. Patient was last seen nearly a year ago. Certainly having worsening symptoms again. Stop taking his allopurinol. Since then seems to begin worse. Walking with the aid of a cane. No new injury. Responded very well to injections previously.   Past Medical History  Diagnosis Date  . Hypertension   . Chronic back pain   . Gout   . Chronic knee pain     left  . Fatty liver   . Abnormal LFTs   . Pancreatitis    Past Surgical History  Procedure Laterality Date  . Knee surgery      left   Social History  Substance Use Topics  . Smoking status: Current Every Day Smoker -- 0.30 packs/day for 30 years    Types: Cigarettes  . Smokeless tobacco: Never Used  . Alcohol Use: 16.8 oz/week    28 Cans of beer per week     Comment: Occasional   Allergies  Allergen Reactions  . Penicillins Hives and Rash   Family History  Problem Relation Age of Onset  . Diabetes Mother   . Diabetes Maternal Grandmother        Past medical history, social, surgical and family history all reviewed in electronic medical record.   Review of Systems: No headache, visual changes, nausea, vomiting, diarrhea, constipation, dizziness, abdominal pain, skin rash, fevers, chills, night sweats, weight loss, swollen lymph nodes, body aches, joint swelling, muscle aches, chest pain, shortness of breath, mood changes.   Objective Blood pressure 118/82, pulse 94, height 5' 5.5" (1.664 m), weight 107 lb (48.535 kg), SpO2 98 %.  General: Severely Cachectic HEENT: Scleritis noted.  Respiratory: Patient's speak in full sentences and does not appear short of breath  Cardiovascular: No lower extremity  edema, non tender, no erythema  Skin: Patient does have significant redness of the palms as well as exfoliation of the skin. Abdomen: Soft nontender  Neuro: Cranial nerves II through XII are intact, neurovascularly intact in all extremities with 2+ DTRs and 2+ pulses.  Lymph: No lymphadenopathy of posterior or anterior cervical chain or axillae bilaterally.  Gait normal with good balance and coordination.  MSK:  Non tender with full range of motion and good stability and symmetric strength and tone of shoulders, elbows, wrist, hip, and ankles bilaterally.  Knee: Left Significant atrophy of the musculature of the legs bilaterally. Tender to palpation over the medial joint linestill noting minorly less than previous exam. ROM full in flexion and extension and lower leg rotation. Ligaments with solid consistent endpoints including ACL, PCL, LCL, MCL. Negative Mcmurray's, Apley's, and Thessalonian tests. Mild painful patellar compression. Patellar glide without crepitus. Patellar and quadriceps tendons unremarkable. Hamstring and quadriceps strength is normal.  Contralateral knee unremarkable About the same as previously.   procedure After informed written and verbal consent, patient was seated on exam table. Left knee was prepped with alcohol swab and utilizing anterolateral approach, patient's left knee space was injected with 4:1  marcaine 0.5%: Kenalog 40mg /dL. Patient tolerated the procedure well without immediate complications.    Impression and Recommendations:     This case required medical decision making of moderate complexity.

## 2016-01-13 NOTE — Patient Instructions (Addendum)
Good to see you  Ice 20 minutes 2 times daily. Usually after activity and before bed. Take the allopurinol 100mg  DAILY pennsaid pinkie amount topically 2 times daily as needed.  Use cane as you need it.  See me again in 3 months if you need it.

## 2016-01-13 NOTE — Assessment & Plan Note (Addendum)
Lower leg cramping with concern for claudication. Previous blood work shows electrolytes within the normal ranges. Obtain ABI. Ice as needed for discomfort and stretching. Follow-up pending ankle brachial index results.

## 2016-01-13 NOTE — Assessment & Plan Note (Signed)
Reviewed and updated patient's medical, surgical, family and social history. Medications and allergies were also reviewed. Basic screenings for depression, activities of daily living, hearing, cognition and safety were performed. Provider list was updated and health plan was provided to the patient.  

## 2016-01-13 NOTE — Patient Instructions (Addendum)
Thank you for choosing [facility].  Summary/Instructions:   They will call to schedule you circulation test.  Please start the antibiotic cream.  Your prescription(s) have been submitted to your pharmacy or been printed and provided for you. Please take as directed and contact our office if you believe you are having problem(s) with the medication(s) or have any questions.  Please stop by the lab on the basement level of the building for your blood work. Your results will be released to [place] (or called to you) after review, usually within 72 hours after test completion. If any changes need to be made, you will be notified at that same time.  If your symptoms worsen or fail to improve, please contact our office for further instruction, or in case of emergency go directly to the emergency room at the closest medical facility.   Health Maintenance  Topic Date Due  . INFLUENZA VACCINE  04/05/2016  . TETANUS/TDAP  10/20/2018  . COLONOSCOPY  10/21/2019  . Hepatitis C Screening  Completed  . HIV Screening  Completed   Health Maintenance, Male A healthy lifestyle and preventative care can promote health and wellness.  Maintain regular health, dental, and eye exams.  Eat a healthy diet. Foods like vegetables, fruits, whole grains, low-fat dairy products, and lean protein foods contain the nutrients you need and are low in calories. Decrease your intake of foods high in solid fats, added sugars, and salt. Get information about a proper diet from your health care provider, if necessary.  Regular physical exercise is one of the most important things you can do for your health. Most adults should get at least 150 minutes of moderate-intensity exercise (any activity that increases your heart rate and causes you to sweat) each week. In addition, most adults need muscle-strengthening exercises on 2 or more days a week.   Maintain a healthy weight. The body mass index (BMI) is a screening  tool to identify possible weight problems. It provides an estimate of body fat based on height and weight. Your health care provider can find your BMI and can help you achieve or maintain a healthy weight. For males 20 years and older:  A BMI below 18.5 is considered underweight.  A BMI of 18.5 to 24.9 is normal.  A BMI of 25 to 29.9 is considered overweight.  A BMI of 30 and above is considered obese.  Maintain normal blood lipids and cholesterol by exercising and minimizing your intake of saturated fat. Eat a balanced diet with plenty of fruits and vegetables. Blood tests for lipids and cholesterol should begin at age 51 and be repeated every 5 years. If your lipid or cholesterol levels are high, you are over age 37, or you are at high risk for heart disease, you may need your cholesterol levels checked more frequently.Ongoing high lipid and cholesterol levels should be treated with medicines if diet and exercise are not working.  If you smoke, find out from your health care provider how to quit. If you do not use tobacco, do not start.  Lung cancer screening is recommended for adults aged 33-80 years who are at high risk for developing lung cancer because of a history of smoking. A yearly low-dose CT scan of the lungs is recommended for people who have at least a 30-pack-year history of smoking and are current smokers or have quit within the past 15 years. A pack year of smoking is smoking an average of 1 pack of cigarettes a day  for 1 year (for example, a 30-pack-year history of smoking could mean smoking 1 pack a day for 30 years or 2 packs a day for 15 years). Yearly screening should continue until the smoker has stopped smoking for at least 15 years. Yearly screening should be stopped for people who develop a health problem that would prevent them from having lung cancer treatment.  If you choose to drink alcohol, do not have more than 2 drinks per day. One drink is considered to be 12 oz  (360 mL) of beer, 5 oz (150 mL) of wine, or 1.5 oz (45 mL) of liquor.  Avoid the use of street drugs. Do not share needles with anyone. Ask for help if you need support or instructions about stopping the use of drugs.  High blood pressure causes heart disease and increases the risk of stroke. High blood pressure is more likely to develop in:  People who have blood pressure in the end of the normal range (100-139/85-89 mm Hg).  People who are overweight or obese.  People who are African American.  If you are 39-70 years of age, have your blood pressure checked every 3-5 years. If you are 68 years of age or older, have your blood pressure checked every year. You should have your blood pressure measured twice--once when you are at a hospital or clinic, and once when you are not at a hospital or clinic. Record the average of the two measurements. To check your blood pressure when you are not at a hospital or clinic, you can use:  An automated blood pressure machine at a pharmacy.  A home blood pressure monitor.  If you are 1-108 years old, ask your health care provider if you should take aspirin to prevent heart disease.  Diabetes screening involves taking a blood sample to check your fasting blood sugar level. This should be done once every 3 years after age 54 if you are at a normal weight and without risk factors for diabetes. Testing should be considered at a younger age or be carried out more frequently if you are overweight and have at least 1 risk factor for diabetes.  Colorectal cancer can be detected and often prevented. Most routine colorectal cancer screening begins at the age of 78 and continues through age 66. However, your health care provider may recommend screening at an earlier age if you have risk factors for colon cancer. On a yearly basis, your health care provider may provide home test kits to check for hidden blood in the stool. A small camera at the end of a tube may be used  to directly examine the colon (sigmoidoscopy or colonoscopy) to detect the earliest forms of colorectal cancer. Talk to your health care provider about this at age 52 when routine screening begins. A direct exam of the colon should be repeated every 5-10 years through age 66, unless early forms of precancerous polyps or small growths are found.  People who are at an increased risk for hepatitis B should be screened for this virus. You are considered at high risk for hepatitis B if:  You were born in a country where hepatitis B occurs often. Talk with your health care provider about which countries are considered high risk.  Your parents were born in a high-risk country and you have not received a shot to protect against hepatitis B (hepatitis B vaccine).  You have HIV or AIDS.  You use needles to inject street drugs.  You live with,  or have sex with, someone who has hepatitis B.  You are a man who has sex with other men (MSM).  You get hemodialysis treatment.  You take certain medicines for conditions like cancer, organ transplantation, and autoimmune conditions.  Hepatitis C blood testing is recommended for all people born from [birth year] through 1966 and any individual with known risk factors for hepatitis C.  Healthy men should no longer receive prostate-specific antigen (PSA) blood tests as part of routine cancer screening. Talk to your health care provider about prostate cancer screening.  Testicular cancer screening is not recommended for adolescents or adult males who have no symptoms. Screening includes self-exam, a health care provider exam, and other screening tests. Consult with your health care provider about any symptoms you have or any concerns you have about testicular cancer.  Practice safe sex. Use condoms and avoid high-risk sexual practices to reduce the spread of sexually transmitted infections (STIs).  You should be screened for STIs, including gonorrhea and chlamydia  if:  You are sexually active and are younger than 24 years.  You are older than 24 years, and your health care provider tells you that you are at risk for this type of infection.  Your sexual activity has changed since you were last screened, and you are at an increased risk for chlamydia or gonorrhea. Ask your health care provider if you are at risk.  If you are at risk of being infected with HIV, it is recommended that you take a prescription medicine daily to prevent HIV infection. This is called pre-exposure prophylaxis (PrEP). You are considered at risk if:  You are a man who has sex with other men (MSM).  You are a heterosexual man who is sexually active with multiple partners.  You take drugs by injection.  You are sexually active with a partner who has HIV.  Talk with your health care provider about whether you are at high risk of being infected with HIV. If you choose to begin PrEP, you should first be tested for HIV. You should then be tested every 3 months for as long as you are taking PrEP.  Use sunscreen. Apply sunscreen liberally and repeatedly throughout the day. You should seek shade when your shadow is shorter than you. Protect yourself by wearing long sleeves, pants, a wide-brimmed hat, and sunglasses year round whenever you are outdoors.  Tell your health care provider of new moles or changes in moles, especially if there is a change in shape or color. Also, tell your health care provider if a mole is larger than the size of a pencil eraser.  A one-time screening for abdominal aortic aneurysm (AAA) and surgical repair of large AAAs by ultrasound is recommended for men aged 57-75 years who are current or former smokers.  Stay current with your vaccines (immunizations).   This information is not intended to replace advice given to you by your health care provider. Make sure you discuss any questions you have with your health care provider.   Document Released:  02/18/2008 Document Revised: 09/12/2014 Document Reviewed: 01/17/2011 Elsevier Interactive Patient Education [employer].

## 2016-01-13 NOTE — Assessment & Plan Note (Signed)
1) Anticipatory Guidance: Discussed importance of wearing a seatbelt while driving and not texting while driving; changing batteries in smoke detector at least once annually; wearing suntan lotion when outside; eating a balanced and moderate diet; getting physical activity at least 30 minutes per day.  2) Immunizations / Screenings / Labs:  All immunizations are up-to-date per recommendations. Due for a dental exam encouraged to be completed independently. PSA is being followed by urology. All other screenings are up-to-date per recommendations. Lab work previous completed at alternative office visit.  Well exam with risk factors for cardiovascular disease including hypertension, tobacco use, and alcohol consumption. Hypertension is adequately controlled with medication regimen and no adverse side effects. Patient continues to smoke with discussion held regarding continued risk for cardiovascular respiratory disease in the future. He is not ready to quit smoking at this time. Currently consuming approximately 4 alcoholic drinks plus per day with elevated liver enzymes and concern for cirrhosis development. Encouraged to decrease alcohol intake to no more than 2 alcoholic drinks per day with the goal of total abstinence from alcohol. Continue other healthy lifestyle behaviors and choices. Follow-up prevention exam in 1 year. Follow-up office visit for chronic conditions.

## 2016-01-13 NOTE — Progress Notes (Signed)
Subjective:    Patient ID: Adam Marshall, male    DOB: [birth year], 55 y.o.   MRN: [id number]  Chief Complaint  Patient presents with  . Rash    has a rash on stomach it does itch at times    HPI:  Adam Marshall is a 55 y.o. male who presents today for an annual wellness visit.   1) Health Maintenance -   Diet - Averaging about 2 small meals per day with a declined appeptite. Consists of fruits, vegetables, and mainly beef. Denies caffeine intake.  Exercise - Steps at home and walks around.   2) Preventative Exams / Immunizations:  Dental -- Due for exam   Vision -- Up to date   Health Maintenance  Topic Date Due  . INFLUENZA VACCINE  04/05/2016  . TETANUS/TDAP  10/20/2018  . COLONOSCOPY  10/21/2019  . Hepatitis C Screening  Completed  . HIV Screening  Completed    Immunization History  Administered Date(s) Administered  . Influenza Split 10/20/2010   3.) Rash - This is a new problem. Associated symptom of a rash located on his stomach has been going on for about 2 weeks. Notes the symptoms started after a new medication Rapaflo. Denies any changes in size. Modifying factors include alcohol and peroxide. Course of the symptoms are improving.   RISK FACTORS  Tobacco History  Smoking status  . Current Every Day Smoker -- 0.30 packs/day for 30 years  . Types: Cigarettes  Smokeless tobacco  . Never Used     Cardiac risk factors: dyslipidemia, hypertension, male gender and smoking/ tobacco exposure.  Depression Screen  Q1: Over the past two weeks, have you felt down, depressed or hopeless? No  Q2: Over the past two weeks, have you felt little interest or pleasure in doing things? No  Have you lost interest or pleasure in daily life? No  Do you often feel hopeless? No  Do you cry easily over simple problems? No  Activities of Daily Living In your present state of health, do you have any difficulty performing the following activities?:  Driving? Not  currently driving Managing money?  No Feeding yourself? No Getting from bed to chair? No Climbing a flight of stairs? No Preparing food and eating?: No Bathing or showering? Needs assistance Getting dressed: Needs some assistance Getting to the toilet? No Using the toilet: No Moving around from place to place: No In the past year have you fallen or had a near fall?:No   Home Safety Has smoke detector and wears seat belts. No firearms. No excess sun exposure. Are there smokers in your home (other than you)?  No Do you feel safe at home?  Yes  Hearing Difficulties: No Do you often ask people to speak up or repeat themselves? No Do you experience ringing or noises in your ears? No  Do you have difficulty understanding soft or whispered voices? No    Cognitive Testing  Alert? Yes   Normal Appearance? Yes  Oriented to person? Yes  Place? Yes   Time? Yes  Recall of three objects?  Yes  Can perform simple calculations? Yes  Displays appropriate judgment? Yes  Can read the correct time from a watch face? Yes  Do you feel that you have a problem with memory? No  Do you often misplace items? No   Advanced Directives have been discussed with the patient? Yes  Current Physicians/Providers and Suppliers  1. Terri Piedra, FNP -Internal Medicine 2. Elta Guadeloupe  Arman Bogus, MD - Urology  Indicate any recent Medical Services you may have received from other than [facility] providers in the past year (date may be approximate).  All answers were reviewed with the patient and necessary referrals were made:  [name], [place]   01/13/2016    Allergies  Allergen Reactions  . Penicillins Hives and Rash     Outpatient Prescriptions Prior to Visit  Medication Sig Dispense Refill  . allopurinol (ZYLOPRIM) 100 MG tablet Take 1 tablet (100 mg total) by mouth daily. 30 tablet 1  . amLODipine (NORVASC) 10 MG tablet Take 1 tablet (10 mg total) by mouth daily. 90 tablet 0  . aspirin 81 MG chewable tablet  Chew 81 mg by mouth daily.    . carvedilol (COREG) 12.5 MG tablet Take 2 tablets (25 mg total) by mouth 2 (two) times daily with a meal. 60 tablet 0  . colchicine 0.6 MG tablet Take 1 tablet (0.6 mg total) by mouth daily. 30 tablet 2  . cyclobenzaprine (FLEXERIL) 5 MG tablet Take 1 tablet (5 mg total) by mouth 3 (three) times daily as needed for muscle spasms. 60 tablet 1  . fluticasone (FLONASE) 50 MCG/ACT nasal spray Place 1 spray into the nose daily.    . meloxicam (MOBIC) 15 MG tablet Take 1 tablet (15 mg total) by mouth daily as needed for pain. 30 tablet 5   No facility-administered medications prior to visit.     Past Medical History  Diagnosis Date  . Hypertension   . Chronic back pain   . Gout   . Chronic knee pain     left  . Fatty liver   . Abnormal LFTs   . Pancreatitis      Past Surgical History  Procedure Laterality Date  . Knee surgery      left     Family History  Problem Relation Age of Onset  . Diabetes Mother   . Diabetes Maternal Grandmother      Social History   Social History  . Marital Status: Divorced    Spouse Name: N/A  . Number of Children: 1  . Years of Education: 14   Occupational History  . Disability     Knee / Back   Social History Main Topics  . Smoking status: Current Every Day Smoker -- 0.30 packs/day for 30 years    Types: Cigarettes  . Smokeless tobacco: Never Used  . Alcohol Use: Yes     Comment: Occasional  . Drug Use: No  . Sexual Activity: Not on file   Other Topics Concern  . Not on file   Social History Narrative   Born and raised in [place], [place]. Fun: Stay home and watch TV.    Denies religious beliefs that would effect health care.      Review of Systems  Constitutional: Denies fever, chills, fatigue, or significant weight gain/loss. HENT: Head: Denies headache or neck pain Ears: Denies changes in hearing, ringing in ears, earache, drainage Nose: Denies discharge, stuffiness, itching, nosebleed,  sinus pain Throat: Denies sore throat, hoarseness, dry mouth, sores, thrush Eyes: Denies loss/changes in vision, pain, redness, blurry/double vision, flashing lights Cardiovascular: Denies chest pain/discomfort, tightness, palpitations, shortness of breath with activity, difficulty lying down, swelling, sudden awakening with shortness of breath Respiratory: Denies shortness of breath, cough, sputum production, wheezing Gastrointestinal: Denies dysphasia, heartburn, change in appetite, nausea, change in bowel habits, rectal bleeding, constipation, diarrhea, yellow skin or eyes Genitourinary: Denies frequency, urgency, burning/pain, blood in  urine, incontinence, change in urinary strength. Musculoskeletal: Denies muscle/joint pain, stiffness, back pain, redness or swelling of joints, trauma Positive for bilateral calf muscle cramping Skin: Denies rashes, lumps, itching, dryness, color changes, or hair/nail changes Neurological: Denies dizziness, fainting, seizures, weakness, numbness, tingling, tremor Psychiatric - Denies nervousness, stress, depression or memory loss Endocrine: Denies heat or cold intolerance, sweating, frequent urination, excessive thirst, changes in appetite Hematologic: Denies ease of bruising or bleeding    Objective:     BP 118/82 mmHg  Pulse 94  Temp(Src) 98.3 F (36.8 C) (Oral)  Resp 16  Ht 5' 5.5" (1.664 m)  Wt 107 lb (48.535 kg)  BMI 17.53 kg/m2  SpO2 98% Nursing note and vital signs reviewed.  Physical Exam  Constitutional: He is oriented to person, place, and time. He appears well-developed and well-nourished. No distress.  HENT:  Head: Normocephalic.  Right Ear: Hearing, tympanic membrane, external ear and ear canal normal.  Left Ear: Hearing, tympanic membrane, external ear and ear canal normal.  Nose: Nose normal.  Mouth/Throat: Uvula is midline, oropharynx is clear and moist and mucous membranes are normal.  Eyes: Conjunctivae and EOM are normal.  Pupils are equal, round, and reactive to light.  Neck: Neck supple. No JVD present. No tracheal deviation present. No thyromegaly present.  Cardiovascular: Normal rate, regular rhythm, normal heart sounds and intact distal pulses.   Pulmonary/Chest: Effort normal and breath sounds normal.  Abdominal: Soft. Bowel sounds are normal. He exhibits no distension and no mass. There is no tenderness. There is no rebound and no guarding.  Musculoskeletal: Normal range of motion. He exhibits no edema or tenderness.  Bilateral calves with no obvious deformity, discoloration, or edema noted. No palpable tenderness. Ankle range of motion within normal limits. Pulses are intact and appropriate. Negative Homans sign.  Lymphadenopathy:    He has no cervical adenopathy.  Neurological: He is alert and oriented to person, place, and time. He has normal reflexes. No cranial nerve deficit. He exhibits normal muscle tone. Coordination normal.  Skin: Skin is warm and dry.  Small approximate 2-3 cm annular lesion that appears excoriated with red borders and no obvious discharge located on the lower abdomen and upper pubis.   Psychiatric: He has a normal mood and affect. His behavior is normal. Judgment and thought content normal.       Assessment & Plan:   During the course of the visit the patient was educated and counseled about appropriate screening and preventive services including:    Pneumococcal vaccine   Influenza vaccine  Td vaccine  Prostate cancer screening  Colorectal cancer screening  Glaucoma screening  Smoking cessation counseling  Diet review for nutrition referral? Yes ____  Not Indicated _X___   Patient Instructions (the written plan) was given to the patient.  Medicare Attestation I have personally reviewed: The patient's medical and social history Their use of alcohol, tobacco or illicit drugs Their current medications and supplements The patient's functional ability  including ADLs,fall risks, home safety risks, cognitive, and hearing and visual impairment Diet and physical activities Evidence for depression or mood disorders  The patient's weight, height, BMI,  have been recorded in the chart.  I have made referrals, counseling, and provided education to the patient based on review of the above and I have provided the patient with a written personalized care plan for preventive services.     [name], [place]   01/13/2016    Problem List Items Addressed This Visit  None

## 2016-01-13 NOTE — Assessment & Plan Note (Signed)
Patient's left knee pain is multifactorial. Patient does have significant muscle wasting. In addition of this patient does have gout. Think this is mostly was contribute in. Some mild underlying arthritis is also contribute in. Encourage him to continue to use the brace, use allopurinol regularly, discussed possible bracing which patient declined. Denies for possible pain medications which I declined. Patient even topical anti-inflammatories a try. Follow-up again in 4 weeks if pain persists.

## 2016-01-13 NOTE — Assessment & Plan Note (Signed)
Injection given today in the left knee. I think that this will be beneficial. Encourage patient take allopurinol on a more regular basis. Has colchicine for breakthrough. We discussed how to take this appropriately. Patient's liver diseases I do not feel that any pain medications or be beneficial in patient has to avoid Tylenol. Patient follow-up and see me again in 4 weeks if pain persists.

## 2016-01-15 ENCOUNTER — Telehealth: Payer: Self-pay | Admitting: Family

## 2016-01-15 MED ORDER — FOLIC ACID 1 MG PO TABS: 1.0000 mg | ORAL_TABLET | Freq: Every day | ORAL | Status: DC

## 2016-01-15 MED ORDER — VITAMIN B-1 100 MG PO TABS: 100.0000 mg | ORAL_TABLET | Freq: Every day | ORAL | Status: DC

## 2016-01-15 NOTE — Telephone Encounter (Signed)
Please inform patient that his liver continues to show evidence of damage most likely related to his alcohol intake. I am going to be sending in a prescription for both thiamine and folic acid for supplementation. I would like him to continue to decrease his alcohol intake and ideally abstain from alcohol all together. Please follow up in 3 months for recheck of liver function.

## 2016-01-18 NOTE — Telephone Encounter (Signed)
Pt aware of results 

## 2016-01-19 MED ORDER — FOLIC ACID 1 MG PO TABS: 1.0000 mg | ORAL_TABLET | Freq: Every day | ORAL | Status: DC

## 2016-01-19 MED ORDER — VITAMIN B-1 100 MG PO TABS: 100.0000 mg | ORAL_TABLET | Freq: Every day | ORAL | Status: DC

## 2016-01-19 NOTE — Telephone Encounter (Signed)
Patient called to advise that we sent the prescription to the incorrect pharmacy. He states that it needs to go to [facility] on [address].   We sent it to [facility] on [address]

## 2016-01-19 NOTE — Addendum Note (Signed)
Addended by: [name] D on: 01/19/2016 12:18 PM   Modules accepted: Orders

## 2016-01-19 NOTE — Telephone Encounter (Signed)
Medications resent to [facility] on [address].

## 2016-01-20 ENCOUNTER — Other Ambulatory Visit: Payer: Self-pay

## 2016-01-20 MED ORDER — AMLODIPINE BESYLATE 10 MG PO TABS: 10.0000 mg | ORAL_TABLET | Freq: Every day | ORAL | Status: DC

## 2016-03-14 ENCOUNTER — Inpatient Hospital Stay (HOSPITAL_COMMUNITY)
Admission: EM | Admit: 2016-03-14 | Discharge: 2016-03-19 | DRG: 432 | Disposition: A | Discharge status: Skilled Nursing Facility | Payer: Medicare HMO | Attending: Internal Medicine | Admitting: Internal Medicine

## 2016-03-14 ENCOUNTER — Inpatient Hospital Stay (HOSPITAL_COMMUNITY): Payer: Medicare HMO

## 2016-03-14 ENCOUNTER — Emergency Department (HOSPITAL_COMMUNITY): Payer: Medicare HMO

## 2016-03-14 ENCOUNTER — Encounter (HOSPITAL_COMMUNITY): Payer: Self-pay | Admitting: Nurse Practitioner

## 2016-03-14 DIAGNOSIS — K7031 Alcoholic cirrhosis of liver with ascites: Secondary | ICD-10-CM | POA: Diagnosis present

## 2016-03-14 DIAGNOSIS — K449 Diaphragmatic hernia without obstruction or gangrene: Secondary | ICD-10-CM | POA: Diagnosis present

## 2016-03-14 DIAGNOSIS — J189 Pneumonia, unspecified organism: Secondary | ICD-10-CM | POA: Diagnosis present

## 2016-03-14 DIAGNOSIS — R220 Localized swelling, mass and lump, head: Secondary | ICD-10-CM | POA: Diagnosis not present

## 2016-03-14 DIAGNOSIS — K760 Fatty (change of) liver, not elsewhere classified: Secondary | ICD-10-CM | POA: Diagnosis present

## 2016-03-14 DIAGNOSIS — K7460 Unspecified cirrhosis of liver: Secondary | ICD-10-CM

## 2016-03-14 DIAGNOSIS — R740 Nonspecific elevation of levels of transaminase and lactic acid dehydrogenase [LDH]: Secondary | ICD-10-CM | POA: Diagnosis present

## 2016-03-14 DIAGNOSIS — Z7289 Other problems related to lifestyle: Secondary | ICD-10-CM | POA: Diagnosis present

## 2016-03-14 DIAGNOSIS — J9690 Respiratory failure, unspecified, unspecified whether with hypoxia or hypercapnia: Secondary | ICD-10-CM

## 2016-03-14 DIAGNOSIS — F1010 Alcohol abuse, uncomplicated: Secondary | ICD-10-CM | POA: Diagnosis present

## 2016-03-14 DIAGNOSIS — J3489 Other specified disorders of nose and nasal sinuses: Secondary | ICD-10-CM | POA: Diagnosis present

## 2016-03-14 DIAGNOSIS — R000 Tachycardia, unspecified: Secondary | ICD-10-CM | POA: Diagnosis present

## 2016-03-14 DIAGNOSIS — Z79899 Other long term (current) drug therapy: Secondary | ICD-10-CM

## 2016-03-14 DIAGNOSIS — R64 Cachexia: Secondary | ICD-10-CM | POA: Diagnosis present

## 2016-03-14 DIAGNOSIS — Z789 Other specified health status: Secondary | ICD-10-CM | POA: Diagnosis present

## 2016-03-14 DIAGNOSIS — J9811 Atelectasis: Secondary | ICD-10-CM | POA: Diagnosis present

## 2016-03-14 DIAGNOSIS — G8929 Other chronic pain: Secondary | ICD-10-CM | POA: Diagnosis present

## 2016-03-14 DIAGNOSIS — D638 Anemia in other chronic diseases classified elsewhere: Secondary | ICD-10-CM | POA: Diagnosis present

## 2016-03-14 DIAGNOSIS — R1310 Dysphagia, unspecified: Secondary | ICD-10-CM | POA: Diagnosis present

## 2016-03-14 DIAGNOSIS — Z7951 Long term (current) use of inhaled steroids: Secondary | ICD-10-CM | POA: Diagnosis not present

## 2016-03-14 DIAGNOSIS — D6489 Other specified anemias: Secondary | ICD-10-CM | POA: Diagnosis not present

## 2016-03-14 DIAGNOSIS — E871 Hypo-osmolality and hyponatremia: Secondary | ICD-10-CM | POA: Insufficient documentation

## 2016-03-14 DIAGNOSIS — E8770 Fluid overload, unspecified: Secondary | ICD-10-CM | POA: Diagnosis present

## 2016-03-14 DIAGNOSIS — I444 Left anterior fascicular block: Secondary | ICD-10-CM | POA: Diagnosis present

## 2016-03-14 DIAGNOSIS — K7210 Chronic hepatic failure without coma: Secondary | ICD-10-CM | POA: Diagnosis present

## 2016-03-14 DIAGNOSIS — J9601 Acute respiratory failure with hypoxia: Secondary | ICD-10-CM | POA: Diagnosis present

## 2016-03-14 DIAGNOSIS — M25562 Pain in left knee: Secondary | ICD-10-CM | POA: Diagnosis present

## 2016-03-14 DIAGNOSIS — M109 Gout, unspecified: Secondary | ICD-10-CM | POA: Diagnosis present

## 2016-03-14 DIAGNOSIS — F1090 Alcohol use, unspecified, uncomplicated: Secondary | ICD-10-CM | POA: Diagnosis present

## 2016-03-14 DIAGNOSIS — K921 Melena: Secondary | ICD-10-CM | POA: Insufficient documentation

## 2016-03-14 DIAGNOSIS — R17 Unspecified jaundice: Secondary | ICD-10-CM | POA: Insufficient documentation

## 2016-03-14 DIAGNOSIS — E43 Unspecified severe protein-calorie malnutrition: Secondary | ICD-10-CM | POA: Diagnosis present

## 2016-03-14 DIAGNOSIS — E876 Hypokalemia: Secondary | ICD-10-CM | POA: Diagnosis present

## 2016-03-14 DIAGNOSIS — Z681 Body mass index (BMI) 19 or less, adult: Secondary | ICD-10-CM

## 2016-03-14 DIAGNOSIS — Z880 Allergy status to penicillin: Secondary | ICD-10-CM

## 2016-03-14 DIAGNOSIS — M549 Dorsalgia, unspecified: Secondary | ICD-10-CM | POA: Diagnosis present

## 2016-03-14 DIAGNOSIS — D649 Anemia, unspecified: Secondary | ICD-10-CM | POA: Diagnosis present

## 2016-03-14 DIAGNOSIS — Z7982 Long term (current) use of aspirin: Secondary | ICD-10-CM

## 2016-03-14 DIAGNOSIS — R1084 Generalized abdominal pain: Secondary | ICD-10-CM | POA: Insufficient documentation

## 2016-03-14 DIAGNOSIS — F17210 Nicotine dependence, cigarettes, uncomplicated: Secondary | ICD-10-CM | POA: Diagnosis present

## 2016-03-14 DIAGNOSIS — K7200 Acute and subacute hepatic failure without coma: Secondary | ICD-10-CM

## 2016-03-14 DIAGNOSIS — Z833 Family history of diabetes mellitus: Secondary | ICD-10-CM | POA: Diagnosis not present

## 2016-03-14 DIAGNOSIS — R140 Abdominal distension (gaseous): Secondary | ICD-10-CM | POA: Diagnosis not present

## 2016-03-14 DIAGNOSIS — J90 Pleural effusion, not elsewhere classified: Secondary | ICD-10-CM | POA: Diagnosis present

## 2016-03-14 DIAGNOSIS — R748 Abnormal levels of other serum enzymes: Secondary | ICD-10-CM | POA: Insufficient documentation

## 2016-03-14 DIAGNOSIS — D500 Iron deficiency anemia secondary to blood loss (chronic): Secondary | ICD-10-CM

## 2016-03-14 DIAGNOSIS — K259 Gastric ulcer, unspecified as acute or chronic, without hemorrhage or perforation: Secondary | ICD-10-CM | POA: Diagnosis present

## 2016-03-14 DIAGNOSIS — K7290 Hepatic failure, unspecified without coma: Secondary | ICD-10-CM | POA: Diagnosis present

## 2016-03-14 DIAGNOSIS — J9691 Respiratory failure, unspecified with hypoxia: Secondary | ICD-10-CM

## 2016-03-14 DIAGNOSIS — K2100 Gastro-esophageal reflux disease with esophagitis, without bleeding: Secondary | ICD-10-CM | POA: Insufficient documentation

## 2016-03-14 DIAGNOSIS — K2210 Ulcer of esophagus without bleeding: Secondary | ICD-10-CM | POA: Diagnosis present

## 2016-03-14 DIAGNOSIS — J439 Emphysema, unspecified: Secondary | ICD-10-CM | POA: Diagnosis present

## 2016-03-14 DIAGNOSIS — K869 Disease of pancreas, unspecified: Secondary | ICD-10-CM | POA: Diagnosis not present

## 2016-03-14 DIAGNOSIS — I10 Essential (primary) hypertension: Secondary | ICD-10-CM | POA: Diagnosis present

## 2016-03-14 DIAGNOSIS — R627 Adult failure to thrive: Secondary | ICD-10-CM | POA: Diagnosis present

## 2016-03-14 DIAGNOSIS — C61 Malignant neoplasm of prostate: Secondary | ICD-10-CM | POA: Diagnosis present

## 2016-03-14 DIAGNOSIS — K766 Portal hypertension: Secondary | ICD-10-CM | POA: Diagnosis present

## 2016-03-14 DIAGNOSIS — J387 Other diseases of larynx: Secondary | ICD-10-CM

## 2016-03-14 DIAGNOSIS — R188 Other ascites: Secondary | ICD-10-CM | POA: Diagnosis not present

## 2016-03-14 LAB — URINE MICROSCOPIC-ADD ON

## 2016-03-14 LAB — PROTEIN, BODY FLUID: Total protein, fluid: <3 g/dL

## 2016-03-14 LAB — BASIC METABOLIC PANEL
BUN: 12 mg/dL (ref 4–21)
Creatinine: 0.6 mg/dL (ref 0.6–1.3)
Glucose: 116 mg/dL
Potassium: 3.4 mmol/L (ref 3.4–5.3)
Sodium: 125 mmol/L — AB (ref 137–147)

## 2016-03-14 LAB — URINALYSIS, ROUTINE W REFLEX MICROSCOPIC
Glucose, UA: NEGATIVE mg/dL
Hgb urine dipstick: NEGATIVE
Ketones, ur: 15 mg/dL — AB
NITRITE: NEGATIVE
PH: 5.5 (ref 5.0–8.0)
Protein, ur: NEGATIVE mg/dL

## 2016-03-14 LAB — LIPASE, BLOOD: Lipase: 47 U/L (ref 11–51)

## 2016-03-14 LAB — COMPREHENSIVE METABOLIC PANEL
ALK PHOS: 655 U/L — AB (ref 38–126)
ALT: 28 U/L (ref 17–63)
ANION GAP: 16 — AB (ref 5–15)
AST: 138 U/L — ABNORMAL HIGH (ref 15–41)
Albumin: 3 g/dL — ABNORMAL LOW (ref 3.5–5.0)
BUN: 12 mg/dL (ref 6–20)
CALCIUM: 8.5 mg/dL — AB (ref 8.9–10.3)
CHLORIDE: 85 mmol/L — AB (ref 101–111)
CO2: 24 mmol/L (ref 22–32)
Creatinine, Ser: 0.62 mg/dL (ref 0.61–1.24)
GFR calc non Af Amer: >60 mL/min (ref >60)
Glucose, Bld: 116 mg/dL — ABNORMAL HIGH (ref 65–99)
Potassium: 3.4 mmol/L — ABNORMAL LOW (ref 3.5–5.1)
SODIUM: 125 mmol/L — AB (ref 135–145)
Total Bilirubin: 2.4 mg/dL — ABNORMAL HIGH (ref 0.3–1.2)
Total Protein: 6.6 g/dL (ref 6.5–8.1)

## 2016-03-14 LAB — CBC WITH DIFFERENTIAL/PLATELET
BASOS PCT: 0 %
Basophils Absolute: 0 10*3/uL (ref 0.0–0.1)
Eosinophils Absolute: 0 10*3/uL (ref 0.0–0.7)
Eosinophils Relative: 0 %
HEMATOCRIT: 21.1 % — AB (ref 39.0–52.0)
HEMOGLOBIN: 7.1 g/dL — AB (ref 13.0–17.0)
Lymphocytes Relative: 5 %
Lymphs Abs: 0.7 10*3/uL (ref 0.7–4.0)
MCH: 32 pg (ref 26.0–34.0)
MCHC: 33.6 g/dL (ref 30.0–36.0)
MCV: 95 fL (ref 78.0–100.0)
MONOS PCT: 11 %
Monocytes Absolute: 1.5 10*3/uL — ABNORMAL HIGH (ref 0.1–1.0)
NEUTROS ABS: 11.6 10*3/uL — AB (ref 1.7–7.7)
NEUTROS PCT: 84 %
Platelets: 190 10*3/uL (ref 150–400)
RBC: 2.22 MIL/uL — AB (ref 4.22–5.81)
RDW: 14.8 % (ref 11.5–15.5)
WBC: 13.9 10*3/uL — AB (ref 4.0–10.5)

## 2016-03-14 LAB — FERRITIN: Ferritin: 163 ng/mL (ref 24–336)

## 2016-03-14 LAB — BODY FLUID CELL COUNT WITH DIFFERENTIAL
Lymphs, Fluid: 4 %
Monocyte-Macrophage-Serous Fluid: 95 % — ABNORMAL HIGH (ref 50–90)
NEUTROPHIL FLUID: 1 % (ref 0–25)
WBC FLUID: 95 uL (ref 0–1000)

## 2016-03-14 LAB — CBC AND DIFFERENTIAL
HCT: 21 % — AB (ref 41–53)
HEMOGLOBIN: 7.1 g/dL — AB (ref 13.5–17.5)
PLATELETS: 190 10*3/uL (ref 150–399)
WBC: 13.9 10*3/mL

## 2016-03-14 LAB — FOLATE: FOLATE: 14 ng/mL (ref >5.9)

## 2016-03-14 LAB — ABO/RH: ABO/RH(D): AB POS

## 2016-03-14 LAB — GRAM STAIN

## 2016-03-14 LAB — HEPATIC FUNCTION PANEL
ALT: 28 U/L (ref 10–40)
Alkaline Phosphatase: 655 U/L — AB (ref 25–125)
BILIRUBIN, TOTAL: 2.4 mg/dL

## 2016-03-14 LAB — IRON AND TIBC
Iron: 57 ug/dL (ref 45–182)
Saturation Ratios: 21 % (ref 17.9–39.5)
TIBC: 269 ug/dL (ref 250–450)
UIBC: 212 ug/dL

## 2016-03-14 LAB — SODIUM, URINE, RANDOM: Sodium, Ur: <10 mmol/L

## 2016-03-14 LAB — OSMOLALITY, URINE: OSMOLALITY UR: 403 mosm/kg (ref 300–900)

## 2016-03-14 LAB — GLUCOSE, SEROUS FLUID: GLUCOSE FL: 109 mg/dL

## 2016-03-14 LAB — POCT INR: INR: 1.2 — AB (ref 0.9–1.1)

## 2016-03-14 LAB — MAGNESIUM: Magnesium: 1.4 mg/dL — ABNORMAL LOW (ref 1.7–2.4)

## 2016-03-14 LAB — STREP PNEUMONIAE URINARY ANTIGEN: Strep Pneumo Urinary Antigen: NEGATIVE

## 2016-03-14 LAB — RETICULOCYTES
RBC.: 2.5 MIL/uL — AB (ref 4.22–5.81)
RETIC COUNT ABSOLUTE: 95 10*3/uL (ref 19.0–186.0)
RETIC CT PCT: 3.8 % — AB (ref 0.4–3.1)

## 2016-03-14 LAB — PROTIME-INR
INR: 1.16 (ref 0.00–1.49)
PROTHROMBIN TIME: 15 s (ref 11.6–15.2)

## 2016-03-14 LAB — ALBUMIN, FLUID (OTHER): Albumin, Fluid: 1.6 g/dL

## 2016-03-14 LAB — PREPARE RBC (CROSSMATCH)

## 2016-03-14 LAB — VITAMIN B12: VITAMIN B 12: 284 pg/mL (ref 180–914)

## 2016-03-14 LAB — POC OCCULT BLOOD, ED: FECAL OCCULT BLD: NEGATIVE

## 2016-03-14 LAB — MRSA PCR SCREENING: MRSA by PCR: NEGATIVE

## 2016-03-14 MED ORDER — ASPIRIN 81 MG PO CHEW: 81.0000 mg | CHEWABLE_TABLET | Freq: Every day | ORAL | Status: DC

## 2016-03-14 MED ORDER — ACETAMINOPHEN 325 MG PO TABS
650.0000 mg | ORAL_TABLET | ORAL | Status: DC | PRN
  Administered 2016-03-14: 650 mg via ORAL
  Filled 2016-03-14: qty 2

## 2016-03-14 MED ORDER — POTASSIUM CHLORIDE CRYS ER 20 MEQ PO TBCR
20.0000 meq | EXTENDED_RELEASE_TABLET | Freq: Once | ORAL | Status: AC
  Administered 2016-03-14: 20 meq via ORAL
  Filled 2016-03-14: qty 1

## 2016-03-14 MED ORDER — VITAMIN B-1 100 MG PO TABS
100.0000 mg | ORAL_TABLET | Freq: Every day | ORAL | Status: DC
Start: 2016-03-14 — End: 2016-03-19
  Administered 2016-03-14 – 2016-03-19 (×5): 100 mg via ORAL
  Filled 2016-03-14 (×5): qty 1

## 2016-03-14 MED ORDER — ONDANSETRON HCL 4 MG/2ML IJ SOLN
4.0000 mg | Freq: Four times a day (QID) | INTRAMUSCULAR | Status: DC | PRN
  Administered 2016-03-14 – 2016-03-18 (×2): 4 mg via INTRAVENOUS
  Filled 2016-03-14 (×2): qty 2

## 2016-03-14 MED ORDER — ALLOPURINOL 100 MG PO TABS
100.0000 mg | ORAL_TABLET | Freq: Every day | ORAL | Status: DC
  Administered 2016-03-14 – 2016-03-19 (×5): 100 mg via ORAL
  Filled 2016-03-14 (×6): qty 1

## 2016-03-14 MED ORDER — MORPHINE SULFATE (PF) 4 MG/ML IV SOLN
4.0000 mg | Freq: Once | INTRAVENOUS | Status: AC
  Administered 2016-03-14: 4 mg via INTRAVENOUS
  Filled 2016-03-14: qty 1

## 2016-03-14 MED ORDER — SODIUM CHLORIDE 0.9 % IV SOLN
Freq: Once | INTRAVENOUS | Status: AC
  Administered 2016-03-14: 09:00:00 via INTRAVENOUS

## 2016-03-14 MED ORDER — ONDANSETRON HCL 4 MG/2ML IJ SOLN
4.0000 mg | Freq: Once | INTRAMUSCULAR | Status: AC
  Administered 2016-03-14: 4 mg via INTRAVENOUS
  Filled 2016-03-14: qty 2

## 2016-03-14 MED ORDER — SODIUM CHLORIDE 0.9% FLUSH
3.0000 mL | Freq: Two times a day (BID) | INTRAVENOUS | Status: DC
  Administered 2016-03-14 – 2016-03-18 (×7): 3 mL via INTRAVENOUS

## 2016-03-14 MED ORDER — LIFITEGRAST 5 % OP SOLN: 1.0000 [drp] | Freq: Two times a day (BID) | OPHTHALMIC | Status: DC

## 2016-03-14 MED ORDER — SODIUM CHLORIDE 0.9 % IV SOLN
250.0000 mL | INTRAVENOUS | Status: DC | PRN
  Administered 2016-03-14: 500 mL via INTRAVENOUS

## 2016-03-14 MED ORDER — ALBUTEROL SULFATE (2.5 MG/3ML) 0.083% IN NEBU: 2.5000 mg | INHALATION_SOLUTION | Freq: Four times a day (QID) | RESPIRATORY_TRACT | Status: DC | PRN

## 2016-03-14 MED ORDER — IOPAMIDOL (ISOVUE-300) INJECTION 61%
100.0000 mL | Freq: Once | INTRAVENOUS | Status: AC | PRN
  Administered 2016-03-14: 80 mL via INTRAVENOUS

## 2016-03-14 MED ORDER — ENSURE ENLIVE PO LIQD
237.0000 mL | Freq: Three times a day (TID) | ORAL | Status: DC
  Administered 2016-03-14 – 2016-03-19 (×7): 237 mL via ORAL

## 2016-03-14 MED ORDER — COLCHICINE 0.6 MG PO TABS
0.6000 mg | ORAL_TABLET | Freq: Every day | ORAL | Status: DC | PRN
  Filled 2016-03-14 (×2): qty 1

## 2016-03-14 MED ORDER — FUROSEMIDE 10 MG/ML IJ SOLN
20.0000 mg | Freq: Once | INTRAMUSCULAR | Status: DC
  Filled 2016-03-14: qty 2

## 2016-03-14 MED ORDER — SODIUM CHLORIDE 0.9 % IV SOLN
250.0000 mL | INTRAVENOUS | Status: DC | PRN
  Administered 2016-03-14: 1000 mL via INTRAVENOUS

## 2016-03-14 MED ORDER — ALBUMIN HUMAN 25 % IV SOLN
25.0000 g | Freq: Once | INTRAVENOUS | Status: AC
  Administered 2016-03-14: 25 g via INTRAVENOUS
  Filled 2016-03-14: qty 50

## 2016-03-14 MED ORDER — FOLIC ACID 1 MG PO TABS
1.0000 mg | ORAL_TABLET | Freq: Every day | ORAL | Status: DC
  Administered 2016-03-14 – 2016-03-19 (×5): 1 mg via ORAL
  Filled 2016-03-14 (×5): qty 1

## 2016-03-14 MED ORDER — DIATRIZOATE MEGLUMINE & SODIUM 66-10 % PO SOLN
30.0000 mL | Freq: Once | ORAL | Status: AC
  Administered 2016-03-14: 30 mL via ORAL

## 2016-03-14 MED ORDER — AMLODIPINE BESYLATE 10 MG PO TABS
10.0000 mg | ORAL_TABLET | Freq: Every day | ORAL | Status: DC
  Filled 2016-03-14: qty 1

## 2016-03-14 MED ORDER — FOLIC ACID 1 MG PO TABS: 1.0000 mg | ORAL_TABLET | Freq: Every day | ORAL | Status: DC

## 2016-03-14 MED ORDER — SODIUM CHLORIDE 0.9% FLUSH
3.0000 mL | Freq: Two times a day (BID) | INTRAVENOUS | Status: DC
  Administered 2016-03-14 – 2016-03-19 (×8): 3 mL via INTRAVENOUS

## 2016-03-14 MED ORDER — LEVOFLOXACIN IN D5W 750 MG/150ML IV SOLN
750.0000 mg | INTRAVENOUS | Status: DC
  Administered 2016-03-14: 750 mg via INTRAVENOUS
  Filled 2016-03-14: qty 150

## 2016-03-14 MED ORDER — VITAMIN B-1 100 MG PO TABS
100.0000 mg | ORAL_TABLET | Freq: Every day | ORAL | Status: DC
Start: 2016-03-14 — End: 2016-03-14

## 2016-03-14 MED ORDER — FLUTICASONE PROPIONATE 50 MCG/ACT NA SUSP
1.0000 | Freq: Every day | NASAL | Status: DC
  Administered 2016-03-14 – 2016-03-19 (×3): 1 via NASAL
  Filled 2016-03-14: qty 16

## 2016-03-14 MED ORDER — ONDANSETRON HCL 4 MG PO TABS: 4.0000 mg | ORAL_TABLET | Freq: Four times a day (QID) | ORAL | Status: DC | PRN

## 2016-03-14 MED ORDER — LORAZEPAM 2 MG/ML IJ SOLN: 2.0000 mg | INTRAMUSCULAR | Status: DC | PRN

## 2016-03-14 MED ORDER — NICOTINE 14 MG/24HR TD PT24
14.0000 mg | MEDICATED_PATCH | Freq: Every day | TRANSDERMAL | Status: DC
  Administered 2016-03-14 – 2016-03-19 (×5): 14 mg via TRANSDERMAL
  Filled 2016-03-14 (×5): qty 1

## 2016-03-14 MED ORDER — SODIUM CHLORIDE 0.9% FLUSH: 3.0000 mL | INTRAVENOUS | Status: DC | PRN

## 2016-03-14 NOTE — Plan of Care (Signed)
55 year old male presents with abdominal discomfort and distention. CT shows ascites and possible cirrhosis which could be new diagnosis. Patient also has anemia but stool for occult blood is negative. Patient will probably need further workup on his ascites including paracentesis. As per the ER physician patient is not in distress.   Gean [place]ch[place].

## 2016-03-14 NOTE — ED Provider Notes (Signed)
CSN: [id number]     Arrival date & time 03/14/16  0118 History  By signing my name below, I, [facility], attest that this documentation has been prepared under the direction and in the presence of [name], MD. Electronically Signed: [name], ED Scribe. 03/14/2016. 3:43 AM.   Chief Complaint  Patient presents with  . Abdominal Pain    The history is provided by the patient and the spouse. No language interpreter was used.   HPI Comments: Adam Marshall is a 55 y.o. male with a hx of HTN, fatty liver, and pancreatis who presents to the Emergency Department complaining of constant, 8/10, LUQ and periumbilical abdominal pain onset 2 days ago. Pt states that has had abdominal distension for the past two days followed by LUQ and periumbilical pain. He reports associated nausea, vomiting, and SOB. He has taken Meloxicam without relief. Per wife, pt was diagnosed with prostate cancer with no metastases last week but she is unsure of what stage the cancer has progressed to. He drank ETOH yesterday when he had a couple of drinks. Denies similar symptoms in the past. Denies fever, chills, and diaphoresis.  Past Medical History  Diagnosis Date  . Hypertension   . Chronic back pain   . Gout   . Chronic knee pain     left  . Fatty liver   . Abnormal LFTs   . Pancreatitis    Past Surgical History  Procedure Laterality Date  . Knee surgery      left   Family History  Problem Relation Age of Onset  . Diabetes Mother   . Diabetes Maternal Grandmother    Social History  Substance Use Topics  . Smoking status: Current Every Day Smoker -- 0.30 packs/day for 30 years    Types: Cigarettes  . Smokeless tobacco: Never Used  . Alcohol Use: 16.8 oz/week    28 Cans of beer per week     Comment: Occasional    Review of Systems  Constitutional: Negative for fever, chills and diaphoresis.  Respiratory: Positive for shortness of breath.   Gastrointestinal: Positive for  nausea, vomiting and abdominal pain.  All other systems reviewed and are negative.     Allergies  Penicillins  Home Medications   Prior to Admission medications   Medication Sig Start Date End Date Taking? Authorizing Provider  allopurinol (ZYLOPRIM) 100 MG tablet Take 1 tablet (100 mg total) by mouth daily. 03/04/15  Yes Lyndal Pulley, DO  amLODipine (NORVASC) 10 MG tablet Take 1 tablet (10 mg total) by mouth daily. 01/20/16  Yes [name], FNP  aspirin 81 MG chewable tablet Chew 81 mg by mouth daily.   Yes Historical Provider, MD  colchicine 0.6 MG tablet Take 1 tablet (0.6 mg total) by mouth daily. Patient taking differently: Take 0.6 mg by mouth daily as needed (for gout).  03/04/15  Yes Lyndal Pulley, DO  cyclobenzaprine (FLEXERIL) 5 MG tablet Take 1 tablet (5 mg total) by mouth 3 (three) times daily as needed for muscle spasms. 10/13/15  Yes Biagio Borg, MD  fluticasone (FLONASE) 50 MCG/ACT nasal spray Place 1 spray into the nose daily.   Yes Historical Provider, MD  folic acid (FOLVITE) 1 MG tablet Take 1 tablet (1 mg total) by mouth daily. 01/19/16  Yes [name], FNP  meloxicam (MOBIC) 15 MG tablet Take 1 tablet (15 mg total) by mouth daily as needed for pain. 10/13/15  Yes Biagio Borg, MD  mupirocin cream (BACTROBAN) 2 % Apply 1 application topically 2 (two) times daily. Patient taking differently: Apply 1 application topically 2 (two) times daily as needed (for toe fungus.).  01/13/16  Yes [name], FNP  thiamine (VITAMIN B-1) 100 MG tablet Take 1 tablet (100 mg total) by mouth daily. 01/19/16  Yes [name], FNP  carvedilol (COREG) 12.5 MG tablet Take 2 tablets (25 mg total) by mouth 2 (two) times daily with a meal. Patient not taking: Reported on 03/14/2016 01/10/13   [name], MD   BP 114/91 mmHg  Pulse 114  Temp(Src) 98.6 F (37 C) (Oral)  Resp 18  SpO2 94% Physical Exam  Constitutional: He is oriented to person, place, and time. He appears  well-developed and well-nourished. No distress.  HENT:  Head: Normocephalic and atraumatic.  Eyes: Conjunctivae are normal.  Neck: Normal range of motion.  Cardiovascular: Normal rate.   Pulmonary/Chest: Effort normal. No respiratory distress. He has decreased breath sounds.  Decreased breath sounds at the right base.  Abdominal: He exhibits distension, fluid wave and ascites. There is no tenderness.  Abdomen moderately distended. Caput medusae present. Tense ascites with positive fluid wave. No tenderness to direct palpation.  Musculoskeletal: Normal range of motion.  Neurological: He is alert and oriented to person, place, and time.  Skin: Skin is warm and dry.  Psychiatric: He has a normal mood and affect. His behavior is normal.  Nursing note and vitals reviewed.   ED Course  Procedures   DIAGNOSTIC STUDIES: Oxygen Saturation is 94% on RA, adequate by my interpretation.    COORDINATION OF CARE: 1:36 AM Will order labs, morphine, and abdomen CT scan. Discussed possible admission to the hospital further care and pt and wife consent to this plan if necessary. Will return to discuss results of labs and imaging. Discussed treatment plan with pt at bedside and pt agreed to plan.  Labs Review Results for orders placed or performed during the hospital encounter of 03/14/16  Comprehensive metabolic panel  Result Value Ref Range   Sodium 125 (L) 135 - 145 mmol/L   Potassium 3.4 (L) 3.5 - 5.1 mmol/L   Chloride 85 (L) 101 - 111 mmol/L   CO2 24 22 - 32 mmol/L   Glucose, Bld 116 (H) 65 - 99 mg/dL   BUN 12 6 - 20 mg/dL   Creatinine, Ser 0.62 0.61 - 1.24 mg/dL   Calcium 8.5 (L) 8.9 - 10.3 mg/dL   Total Protein 6.6 6.5 - 8.1 g/dL   Albumin 3.0 (L) 3.5 - 5.0 g/dL   AST 138 (H) 15 - 41 U/L   ALT 28 17 - 63 U/L   Alkaline Phosphatase 655 (H) 38 - 126 U/L   Total Bilirubin 2.4 (H) 0.3 - 1.2 mg/dL   GFR calc non Af Amer >60 >60 mL/min   GFR calc Af Amer >60 >60 mL/min   Anion gap 16 (H) 5  - 15  CBC with Differential  Result Value Ref Range   WBC 13.9 (H) 4.0 - 10.5 K/uL   RBC 2.22 (L) 4.22 - 5.81 MIL/uL   Hemoglobin 7.1 (L) 13.0 - 17.0 g/dL   HCT 21.1 (L) 39.0 - 52.0 %   MCV 95.0 78.0 - 100.0 fL   MCH 32.0 26.0 - 34.0 pg   MCHC 33.6 30.0 - 36.0 g/dL   RDW 14.8 11.5 - 15.5 %   Platelets 190 150 - 400 K/uL   Neutrophils Relative % 84 %  Neutro Abs 11.6 (H) 1.7 - 7.7 K/uL   Lymphocytes Relative 5 %   Lymphs Abs 0.7 0.7 - 4.0 K/uL   Monocytes Relative 11 %   Monocytes Absolute 1.5 (H) 0.1 - 1.0 K/uL   Eosinophils Relative 0 %   Eosinophils Absolute 0.0 0.0 - 0.7 K/uL   Basophils Relative 0 %   Basophils Absolute 0.0 0.0 - 0.1 K/uL  Lipase, blood  Result Value Ref Range   Lipase 47 11 - 51 U/L  Protime-INR  Result Value Ref Range   Prothrombin Time 15.0 11.6 - 15.2 seconds   INR 1.16 0.00 - 1.49     Imaging Review Dg Chest 2 View  03/14/2016  CLINICAL DATA:  55 year old male with abdominal pain EXAM: CHEST  2 VIEW COMPARISON:  Chest radiograph dated 08/04/2004 FINDINGS: There is a shallow inspiration. Focal area of opacity at the right lung base posteriorly may represent atelectasis or pneumonia. A small right pleural effusion may be present. There is no pneumothorax. The cardiac silhouette is within normal limits. No acute osseous pathology. IMPRESSION: Focal right lung base atelectasis versus infiltrate. Clinical correlation and follow-up recommended. Electronically Signed   By: Anner Crete M.D.   On: 03/14/2016 02:39   Ct Abdomen Pelvis W Contrast  03/14/2016  CLINICAL DATA:  Left upper quadrant and periumbilical pain for 2 days. Abdominal distention. Nausea and vomiting. Elevated white cell count. Recent diagnosis of prostate cancer. EXAM: CT ABDOMEN AND PELVIS WITH CONTRAST TECHNIQUE: Multidetector CT imaging of the abdomen and pelvis was performed using the standard protocol following bolus administration of intravenous contrast. CONTRAST:  79mL ISOVUE-300  IOPAMIDOL (ISOVUE-300) INJECTION 61% COMPARISON:  None. FINDINGS: Small bilateral pleural effusions with basilar atelectasis, greater on the left. Emphysematous changes and fibrosis in the lung bases. Coronary artery calcifications. Small esophageal hiatal hernia. Diffuse fatty infiltration of the liver. Sub cm low-attenuation lesions in the liver are too small to characterize but likely represent cysts. Recanalization of the periumbilical veins. Calcifications in the left lobe of the liver, likely dystrophic. Probable ectatic cirrhosis with enlarged lateral segment left lobe and mild nodularity to the liver contour. Spleen size is normal. Moderate amount of free fluid throughout the abdomen and pelvis, likely representing ascites. Cystic lesion in the uncinate process of the pancreas measuring 1.5 cm diameter. Suggest follow-up in 1 year with MRI. Left adrenal gland nodule measuring 1 cm diameter. This is indeterminate but statistically likely represents an adenoma. Scattered lymph nodes in the retroperitoneum are not pathologically enlarged. Diffuse calcification of abdominal aorta without aneurysm. Inferior vena cava and kidneys are unremarkable. Stomach, small bowel, and colon are not abnormally distended. No free air in the abdomen. Pelvis: Diverticula in the sigmoid colon without evidence of diverticulitis. Prostate gland is enlarged at 4.1 cm diameter. Bladder is mostly decompressed. No pelvic mass or lymphadenopathy. Appendix is normal. Degenerative changes in the spine and hips. No destructive bone lesions. IMPRESSION: Hepatic cirrhosis and fatty infiltration with recanalization of the periumbilical veins. Moderate diffuse fluid throughout the abdomen and pelvis likely due to ascites. Bilateral pleural effusions with basilar atelectasis. 1.5 cm low-attenuation lesion in the uncinate process of the pancreas. Suggest follow-up MRI in 1 year. Electronically Signed   By: Lucienne Capers M.D.   On: 03/14/2016  05:23   I have personally reviewed and evaluated these images and lab results as part of my medical decision-making.  MDM   Final diagnoses:  Ascites  Cirrhosis of liver with ascites, unspecified hepatic cirrhosis type (  HCC)  Hyponatremia  Normochromic normocytic anemia  Lesion of pancreas    Tense ascites with findings suggestive of cirrhosis with It may do use a. Old records are reviewed and he does have history of elevated liver enzymes and history of alcohol abuse. Cirrhosis is most likely secondary to alcohol abuse. He is sent for CT of abdomen and pelvis confirming presence of large amount of ascites, and a changes of cirrhosis. Lateral pleural effusions are also noted. Incidental finding of low-attenuation lesion in the pancreas. Hemoglobin has come back significantly decreased from baseline. Rectal examination is done showing stool which is Hemoccult negative. INR is normal. Moderate hyponatremia is present which is felt to be due to his cirrhosis. Case is discussed with Dr. Hal Hope of [facility] who agrees to admit the patient. I am hoping that he can have a therapeutic paracentesis for comfort. He will also have to have his anemia investigated.   I personally performed the services described in this documentation, which was scribed in my presence. The recorded information has been reviewed and is accurate.      [name], MD [id number]

## 2016-03-14 NOTE — H&P (Signed)
History and Physical    TRAVARIS KOSH QIW:[id number] DOB: 1961-05-02 DOA: 03/14/2016  PCP: [name], FNP  Patient coming from: Home   Chief Complaint: Abdominal distension.   HPI: Adam Marshall is a 55 y.o. male with medical history significant of Fatty liver, alcohol use, HTN, Gout who presents complain of abdominal distension that started 2 weeks ago. He relates that distension got worse day prior to admission. He also report having to use pampers because he is not able to control his bowel movement. He denies abdominal pain. He does relates SOB, cough, nausea and vomiting. He was found to be hypoxic in the ED. He is requiring 4 L to keep oxygen sat above 92. He denies fevers.   ED Course: Presents with abdominal distension, found to have cirrhosis of liver, current alcohol use, : Sodium 125, k 3.4, chloride 85, alk phosphatase, AST 138, bili 2.4, hb 7.1, WBC 13, INR 1.1, occult blood , CT abdomen pelvis: Hepatic cirrhosis and fatty infiltration with recanalization of the periumbilical veins. Moderate diffuse fluid throughout the abdomen and pelvis likely due to ascites. Bilateral pleural effusions with basilar atelectasis. 1.5 cm low-attenuation lesion in the uncinate process of the pancreas. Suggest follow-up MRI in 1 year. Chest x- ray: Focal right lung base atelectasis versus infiltrate.   Review of Systems: As per HPI otherwise 10 point review of systems negative.    Past Medical History  Diagnosis Date  . Hypertension   . Chronic back pain   . Gout   . Chronic knee pain     left  . Fatty liver   . Abnormal LFTs   . Pancreatitis     Past Surgical History  Procedure Laterality Date  . Knee surgery      left   Social History;   reports that he has been smoking Cigarettes.  He has a 9 pack-year smoking history. He has never used smokeless tobacco. He reports that he drinks about 16.8 oz of alcohol per week. He reports that he does not use illicit  drugs.  Allergies  Allergen Reactions  . Penicillins Hives and Rash    Has patient had a PCN reaction causing immediate rash, facial/tongue/throat swelling, SOB or lightheadedness with hypotension:Yes Has patient had a PCN reaction causing severe rash involving mucus membranes or skin necrosis:unsure Has patient had a PCN reaction that required hospitalization:No Has patient had a PCN reaction occurring within the last 10 years:No  If all of the above answers are "NO", then may proceed with Cephalosporin use.     Family History  Problem Relation Age of Onset  . Diabetes Mother   . Diabetes Maternal Grandmother      Prior to Admission medications   Medication Sig Start Date End Date Taking? Authorizing Provider  allopurinol (ZYLOPRIM) 100 MG tablet Take 1 tablet (100 mg total) by mouth daily. 03/04/15  Yes Lyndal Pulley, DO  amLODipine (NORVASC) 10 MG tablet Take 1 tablet (10 mg total) by mouth daily. 01/20/16  Yes [name], FNP  aspirin 81 MG chewable tablet Chew 81 mg by mouth daily.   Yes Historical Provider, MD  colchicine 0.6 MG tablet Take 1 tablet (0.6 mg total) by mouth daily. Patient taking differently: Take 0.6 mg by mouth daily as needed (for gout).  03/04/15  Yes Lyndal Pulley, DO  cyclobenzaprine (FLEXERIL) 5 MG tablet Take 1 tablet (5 mg total) by mouth 3 (three) times daily as needed for muscle spasms. 10/13/15  Yes  Biagio Borg, MD  fluticasone [facility]) 50 MCG/ACT nasal spray Place 1 spray into the nose daily.   Yes Historical Provider, MD  folic acid (FOLVITE) 1 MG tablet Take 1 tablet (1 mg total) by mouth daily. 01/19/16  Yes [name], FNP  Lifitegrast [facility]) 5 % SOLN Apply 1 drop to eye 2 (two) times daily.   Yes Historical Provider, MD  meloxicam (MOBIC) 15 MG tablet Take 1 tablet (15 mg total) by mouth daily as needed for pain. 10/13/15  Yes Biagio Borg, MD  mupirocin cream (BACTROBAN) 2 % Apply 1 application topically 2 (two) times daily. Patient  taking differently: Apply 1 application topically 2 (two) times daily as needed (for toe fungus.).  01/13/16  Yes [name], FNP  thiamine (VITAMIN B-1) 100 MG tablet Take 1 tablet (100 mg total) by mouth daily. 01/19/16  Yes [name], FNP  carvedilol (COREG) 12.5 MG tablet Take 2 tablets (25 mg total) by mouth 2 (two) times daily with a meal. Patient not taking: Reported on 03/14/2016 01/10/13   [name], MD    Physical Exam: Filed Vitals:   03/14/16 0430 03/14/16 0600 03/14/16 0700 03/14/16 0741  BP: 129/100 132/98 117/96   Pulse: 135 137 135   Temp:      TempSrc:      Resp: 19 16    Weight:    49.896 kg (110 lb)  SpO2: 92% 88% 91%       Constitutional: NAD, calm, comfortable, cachetic.  Filed Vitals:   03/14/16 0430 03/14/16 0600 03/14/16 0700 03/14/16 0741  BP: 129/100 132/98 117/96   Pulse: 135 137 135   Temp:      TempSrc:      Resp: 19 16    Weight:    49.896 kg (110 lb)  SpO2: 92% 88% 91%    Eyes: PERRL, lids normal and icteric conjunctivae  ENMT: Mucous membranes are moist. Posterior pharynx clear of any exudate or lesions.Normal dentition.  Neck: normal, supple, no masses, no thyromegaly Respiratory: Normal respiratory effort. No accessory muscle use. Mild crackles, no wheezing.  Cardiovascular: Regular rate and rhythm, no murmurs / rubs / gallops. No extremity edema. 2+ pedal pulses. No carotid bruits.  Abdomen: no tenderness, unable to evaluate for  hepatosplenomegaly. Bowel sounds positive. Very distended, tight  Musculoskeletal: no clubbing / cyanosis. No joint deformity upper and lower extremities. Good ROM, no contractures. Normal muscle tone.  Skin: no rashes, lesions, ulcers. No induration Neurologic: CN 2-12 grossly intact. Sensation intact, DTR normal. Strength 5/5 in all 4.  Psychiatric: Normal judgment and insight. Alert and oriented x 3. Normal mood.    Labs on Admission: I have personally reviewed following labs and imaging  studies  CBC:  Recent Labs Lab 03/14/16 0222  WBC 13.9*  NEUTROABS 11.6*  HGB 7.1*  HCT 21.1*  MCV 95.0  PLT 160   Basic Metabolic Panel:  Recent Labs Lab 03/14/16 0222  NA 125*  K 3.4*  CL 85*  CO2 24  GLUCOSE 116*  BUN 12  CREATININE 0.62  CALCIUM 8.5*   GFR: Estimated Creatinine Clearance: 72.8 mL/min (by C-G formula based on Cr of 0.62). Liver Function Tests:  Recent Labs Lab 03/14/16 0222  AST 138*  ALT 28  ALKPHOS 655*  BILITOT 2.4*  PROT 6.6  ALBUMIN 3.0*    Recent Labs Lab 03/14/16 0222  LIPASE 47   No results for input(s): AMMONIA in the last 168 hours. Coagulation  Profile:  Recent Labs Lab 03/14/16 0222  INR 1.16   Cardiac Enzymes: No results for input(s): CKTOTAL, CKMB, CKMBINDEX, TROPONINI in the last 168 hours. BNP (last 3 results) No results for input(s): PROBNP in the last 8760 hours. HbA1C: No results for input(s): HGBA1C in the last 72 hours. CBG: No results for input(s): GLUCAP in the last 168 hours. Lipid Profile: No results for input(s): CHOL, HDL, LDLCALC, TRIG, CHOLHDL, LDLDIRECT in the last 72 hours. Thyroid Function Tests: No results for input(s): TSH, T4TOTAL, FREET4, T3FREE, THYROIDAB in the last 72 hours. Anemia Panel: No results for input(s): VITAMINB12, FOLATE, FERRITIN, TIBC, IRON, RETICCTPCT in the last 72 hours. Urine analysis:    Component Value Date/Time   COLORURINE YELLOW 10/13/2015 1750   APPEARANCEUR CLEAR 10/13/2015 1750   LABSPEC 1.025 10/13/2015 1750   PHURINE 6.0 10/13/2015 1750   GLUCOSEU NEGATIVE 10/13/2015 1750   GLUCOSEU NEGATIVE 12/02/2011 1841   HGBUR SMALL* 10/13/2015 1750   BILIRUBINUR MODERATE* 10/13/2015 1750   KETONESUR 15* 10/13/2015 1750   PROTEINUR NEGATIVE 12/02/2011 1841   UROBILINOGEN >=8.0* 10/13/2015 1750   NITRITE NEGATIVE 10/13/2015 1750   LEUKOCYTESUR TRACE* 10/13/2015 1750   Sepsis Labs:  !!!!!!!!!!!!!!!!!!!!!!!!!!!!!!!!!!!!!!!!!!!! @LABRCNTIP (procalcitonin:4,lacticidven:4) )No results found for this or any previous visit (from the past 240 hour(s)).   Radiological Exams on Admission: Dg Chest 2 View  03/14/2016  CLINICAL DATA:  55 year old male with abdominal pain EXAM: CHEST  2 VIEW COMPARISON:  Chest radiograph dated 08/04/2004 FINDINGS: There is a shallow inspiration. Focal area of opacity at the right lung base posteriorly may represent atelectasis or pneumonia. A small right pleural effusion may be present. There is no pneumothorax. The cardiac silhouette is within normal limits. No acute osseous pathology. IMPRESSION: Focal right lung base atelectasis versus infiltrate. Clinical correlation and follow-up recommended. Electronically Signed   By: Anner Crete M.D.   On: 03/14/2016 02:39   Ct Abdomen Pelvis W Contrast  03/14/2016  CLINICAL DATA:  Left upper quadrant and periumbilical pain for 2 days. Abdominal distention. Nausea and vomiting. Elevated white cell count. Recent diagnosis of prostate cancer. EXAM: CT ABDOMEN AND PELVIS WITH CONTRAST TECHNIQUE: Multidetector CT imaging of the abdomen and pelvis was performed using the standard protocol following bolus administration of intravenous contrast. CONTRAST:  64m ISOVUE-300 IOPAMIDOL (ISOVUE-300) INJECTION 61% COMPARISON:  None. FINDINGS: Small bilateral pleural effusions with basilar atelectasis, greater on the left. Emphysematous changes and fibrosis in the lung bases. Coronary artery calcifications. Small esophageal hiatal hernia. Diffuse fatty infiltration of the liver. Sub cm low-attenuation lesions in the liver are too small to characterize but likely represent cysts. Recanalization of the periumbilical veins. Calcifications in the left lobe of the liver, likely dystrophic. Probable ectatic cirrhosis with enlarged lateral segment left lobe and mild nodularity to the liver contour. Spleen size is normal. Moderate amount of  free fluid throughout the abdomen and pelvis, likely representing ascites. Cystic lesion in the uncinate process of the pancreas measuring 1.5 cm diameter. Suggest follow-up in 1 year with MRI. Left adrenal gland nodule measuring 1 cm diameter. This is indeterminate but statistically likely represents an adenoma. Scattered lymph nodes in the retroperitoneum are not pathologically enlarged. Diffuse calcification of abdominal aorta without aneurysm. Inferior vena cava and kidneys are unremarkable. Stomach, small bowel, and colon are not abnormally distended. No free air in the abdomen. Pelvis: Diverticula in the sigmoid colon without evidence of diverticulitis. Prostate gland is enlarged at 4.1 cm diameter. Bladder is mostly decompressed. No pelvic mass or lymphadenopathy.  Appendix is normal. Degenerative changes in the spine and hips. No destructive bone lesions. IMPRESSION: Hepatic cirrhosis and fatty infiltration with recanalization of the periumbilical veins. Moderate diffuse fluid throughout the abdomen and pelvis likely due to ascites. Bilateral pleural effusions with basilar atelectasis. 1.5 cm low-attenuation lesion in the uncinate process of the pancreas. Suggest follow-up MRI in 1 year. Electronically Signed   By: Lucienne Capers M.D.   On: 03/14/2016 05:23    EKG: will order EKG   Assessment/Plan Principal Problem:   Cirrhosis of liver with ascites (HCC) Active Problems:   HYPERTENSION, BENIGN ESSENTIAL   Alcohol use ([place])   Liver failure (HCC)   Anemia   Respiratory failure with hypoxia (HCC)  1-Acute hypoxic Respiratory Failure:  This could be multifactorial, might have underline emphysema, and anemia, PNA, Abdominal distension.  Admit to step down.  Treat for PNA with IV antibiotics.  2 units PRBC.  Paracentesis.   2-Cirrhosis Liver; In setting of alcohol, fatty liver.  GI consulted.  Elevation of Alk Phosphatase, will check UA>  Support care.  Needs paracentesis for ascites.    3-Hyponatremia; could be secondary to cirrhosis and or dehydration.  Would Avoid IV fluids due to significant ascites. Might be able to give IV fluids after paracentesis.  Check urine sodium, osmolality./   4-Anemia:  Anemia panel ordered.  Last hb per records 5 months ago was at 56.  Occult blood negative.  GI consulted.   5-PNA; He report productive cough, chest x ray with possible infiltrates. Leukocytosis.  Will cover with Levaquin.   6-Alcohol use: High risk for withdrawal.  CIWA protocol ordered.   7-Hypokalemia;  Replace orally.    DVT prophylaxis: SCD Code Status: Full code.  Family Communication: care discussed with patient  Disposition Plan: Probably home at time of discharge  Consults called: GI Admission status: inpatient    Niel Hummer A MD [facility] Pager [phone]  If 7PM-7AM, please contact night-coverage [email or web address] Password [facility]  03/14/2016, 7:46 AM

## 2016-03-14 NOTE — Progress Notes (Signed)
Utilization Review completed.  Deandrea Rion RN CM  

## 2016-03-14 NOTE — ED Notes (Signed)
Bed: [id number] Expected date:  Expected time:  Means of arrival:  Comments: EMS- ABD PAIN

## 2016-03-14 NOTE — ED Notes (Signed)
Pt was notified about Urinalysis

## 2016-03-14 NOTE — ED Notes (Signed)
Agricultural consultant given report.  Once RN comes in, will get call from the floor as to when patient can go up.

## 2016-03-14 NOTE — ED Notes (Signed)
Pt is presented from with a grossly distended abdomen typical of ascites, endorses hx of alcohol induced liver problems and pancreatitis, reports abdominal pain 8/10, last alcohol drink couple of hours ago while celebrating his birthday.

## 2016-03-14 NOTE — Consult Note (Signed)
Consultation  Referring Provider:   Dr. Hal Hope   Primary Care Physician:  [name], [place] Primary Gastroenterologist:   None      Reason for Consultation: Abdominal Distension, Cirrhosis             HPI:   Adam Marshall is a 55 y.o. African American male with a medical history significant for fatty liver, alcohol use, hypertension and gout who presented to the ER on 03/14/16 with a complaint of abdominal distention. It should be noted that the patient appears to be a poor historian.   At the time of my interview this morning, the patient tells me that he noticed his "abdomen getting bigger", about a week ago. He tells me that this has never happened before, he denies any accompanying abdominal pain. He describes some associated dyspnea on exertion and describes that 3 days ago after hiccuping he had 2 episodes of vomiting. He denies any since that time. Patient does tell me that it was his birthday was yesterday and he did have a "few more drinks than normal". He tells me that he turned "50" (per chart, 55).  Patient also describes having a decreased appetite and some episodes of dysphagia over the past week. He also describes seeing some bright red blood mixed in with his bowel movements a few days ago. He expresses that his "girlfriend told him it was a lot of blood". The patient then goes on to tell me that he has been having regular solid bowel movements every few days, but has not had one in the past 2 days. He does admit to using a baby aspirin on a daily basis and drinking alcohol, though he will not tell me how much.  Patient also describes remote workup of his prostate, but is uncertain why this was done. He tells me this was done last week.  Patient does describe some palpitations and chest pain as well as shortness of breath and dizziness upon standing. He also tells me that recently the vision in his right eye has decreased. Per nursing patient also has a temperature  of 100.9 at time of my interview.  Patient denies hematemesis or previous episodes of the same.  Per ED physician's notes, patient described having uncontrollable bowel movements and having to wear Pampers. He denied this at time of my interview.  ED course: Patient presented with abdominal distention and was found to be hypoxic requiring 4 L of oxygen to remain saturation above 92. His sodium was 125 with potassium of 3.4, chloride 85, alkaline phosphatase 655, lipase normal, AST 138, ALT 28 and total bilirubin 2.4. CT of the abdomen and pelvis shows hepatic cirrhosis and fatty infiltration with recanalization of the periumbilical veins. Moderate diffuse fluid throughout the abdomen and pelvis likely due to ascites. Bilateral pleural effusions with basilar atelectasis. 1.5 cm low-attenuation lesion in the uncontained process of the pancreas. And a chest x-ray showed focal right lung base atelectasis versus infiltrate.    Past Medical History  Diagnosis Date  . Hypertension   . Chronic back pain   . Gout   . Chronic knee pain     left  . Fatty liver   . Abnormal LFTs   . Pancreatitis     Past Surgical History  Procedure Laterality Date  . Knee surgery      left    Family History  Problem Relation Age of Onset  . Diabetes Mother   . Diabetes Maternal Grandmother  Social History  Substance Use Topics  . Smoking status: Current Every Day Smoker -- 0.30 packs/day for 30 years    Types: Cigarettes  . Smokeless tobacco: Never Used  . Alcohol Use: 16.8 oz/week    28 Cans of beer per week     Comment: Occasional    Prior to Admission medications   Medication Sig Start Date End Date Taking? Authorizing Provider  allopurinol (ZYLOPRIM) 100 MG tablet Take 1 tablet (100 mg total) by mouth daily. 03/04/15  Yes Lyndal Pulley, DO  amLODipine (NORVASC) 10 MG tablet Take 1 tablet (10 mg total) by mouth daily. 01/20/16  Yes [name], FNP  aspirin 81 MG chewable tablet Chew 81  mg by mouth daily.   Yes Historical Provider, MD  colchicine 0.6 MG tablet Take 1 tablet (0.6 mg total) by mouth daily. Patient taking differently: Take 0.6 mg by mouth daily as needed (for gout).  03/04/15  Yes Lyndal Pulley, DO  cyclobenzaprine (FLEXERIL) 5 MG tablet Take 1 tablet (5 mg total) by mouth 3 (three) times daily as needed for muscle spasms. 10/13/15  Yes Biagio Borg, MD  fluticasone (FLONASE) 50 MCG/ACT nasal spray Place 1 spray into the nose daily.   Yes Historical Provider, MD  folic acid (FOLVITE) 1 MG tablet Take 1 tablet (1 mg total) by mouth daily. 01/19/16  Yes [name], FNP  Lifitegrast Shirley Friar) 5 % SOLN Apply 1 drop to eye 2 (two) times daily.   Yes Historical Provider, MD  meloxicam (MOBIC) 15 MG tablet Take 1 tablet (15 mg total) by mouth daily as needed for pain. 10/13/15  Yes Biagio Borg, MD  mupirocin cream (BACTROBAN) 2 % Apply 1 application topically 2 (two) times daily. Patient taking differently: Apply 1 application topically 2 (two) times daily as needed (for toe fungus.).  01/13/16  Yes [name], FNP  thiamine (VITAMIN B-1) 100 MG tablet Take 1 tablet (100 mg total) by mouth daily. 01/19/16  Yes [name], FNP  carvedilol (COREG) 12.5 MG tablet Take 2 tablets (25 mg total) by mouth 2 (two) times daily with a meal. Patient not taking: Reported on 03/14/2016 01/10/13   [name], MD    Current Facility-Administered Medications  Medication Dose Route Frequency Provider Last Rate Last Dose  . 0.9 %  sodium chloride infusion  250 mL Intravenous PRN Belkys A Regalado, MD      . 0.9 %  sodium chloride infusion  250 mL Intravenous PRN Belkys A Regalado, MD      . acetaminophen (TYLENOL) tablet 650 mg  650 mg Oral Q4H PRN Belkys A Regalado, MD   650 mg at 03/14/16 0944  . albuterol (PROVENTIL) (2.5 MG/3ML) 0.083% nebulizer solution 2.5 mg  2.5 mg Nebulization Q6H PRN Belkys A Regalado, MD      . allopurinol (ZYLOPRIM) tablet 100 mg  100 mg Oral Daily  [name], MD   123XX123 mg at 03/14/16 0945  . colchicine tablet 0.6 mg  0.6 mg Oral Daily PRN Belkys A Regalado, MD      . fluticasone (FLONASE) 50 MCG/ACT nasal spray 1 spray  1 spray Each Nare Daily [name], MD   1 spray at 03/14/16 0956  . folic acid (FOLVITE) tablet 1 mg  1 mg Oral Daily Belkys A Regalado, MD   1 mg at 03/14/16 0945  . furosemide (LASIX) injection 20 mg  20 mg Intravenous Once Elmarie Shiley, MD      .  levofloxacin (LEVAQUIN) IVPB 750 mg  750 mg Intravenous Q24H Belkys A Regalado, MD   750 mg at 03/14/16 0956  . Lifitegrast 5 % SOLN 1 drop  1 drop Ophthalmic BID [name], MD      . LORazepam (ATIVAN) injection 2-3 mg  2-3 mg Intravenous Q1H PRN Belkys A Regalado, MD      . nicotine (NICODERM CQ - dosed in mg/24 hours) patch 14 mg  14 mg Transdermal Daily Belkys A Regalado, MD   14 mg at 03/14/16 0946  . ondansetron (ZOFRAN) tablet 4 mg  4 mg Oral Q6H PRN Belkys A Regalado, MD       Or  . ondansetron (ZOFRAN) injection 4 mg  4 mg Intravenous Q6H PRN Belkys A Regalado, MD      . sodium chloride flush (NS) 0.9 % injection 3 mL  3 mL Intravenous Q12H Belkys A Regalado, MD   3 mL at 03/14/16 1000  . sodium chloride flush (NS) 0.9 % injection 3 mL  3 mL Intravenous PRN Belkys A Regalado, MD      . sodium chloride flush (NS) 0.9 % injection 3 mL  3 mL Intravenous Q12H Belkys A Regalado, MD   3 mL at 03/14/16 1000  . sodium chloride flush (NS) 0.9 % injection 3 mL  3 mL Intravenous PRN Belkys A Regalado, MD      . thiamine (VITAMIN B-1) tablet 100 mg  100 mg Oral Daily Belkys A Regalado, MD   100 mg at 03/14/16 0945    Allergies as of 03/14/2016 - Review Complete 03/14/2016  Allergen Reaction Noted  . Penicillins Hives and Rash 07/07/2010     Review of Systems:    Constitutional: Positive for fever and weakness No weight loss, chills or fatigue HEENT: Eyes: Decreased vision in the right eye over the past 2 weeks               Ears, Nose, Throat:  Patient denies  hearing loss or congestion Skin: No rash or itching Cardiovascular: Positive for chest pain and palpitations upon standing No chest pressure or chest discomfort. No edema   Respiratory: Positive for shortness of breath; no cough or sputum Gastrointestinal: See HPI and otherwise negative Genitourinary: No hematuria Neurological: Positive for dizziness upon standing No headache or syncope Musculoskeletal: Positive for right knee pain 1 week Hematologic: Vague history of rectal bleeding?  Psychiatric: No history of depression or anxiety   Physical Exam:  Vital signs in last 24 hours: Temp:  [98.6 F (37 C)-100.9 F (38.3 C)] 100.9 F (38.3 C) (07/10 0856) Pulse Rate:  [114-138] 135 (07/10 1000) Resp:  [16-24] 21 (07/10 0856) BP: (109-146)/(86-100) 126/86 mmHg (07/10 1000) SpO2:  [88 %-97 %] 97 % (07/10 0856) Weight:  [110 lb (49.896 kg)-119 lb 11.4 oz (54.3 kg)] 119 lb 11.4 oz (54.3 kg) (07/10 0856) Last BM Date: 03/12/16 General: Thin appearing African-American male appears to be in NAD, Well developed, Well nourished, alert and cooperative Head:  Normocephalic and atraumatic. Eyes:   PEERL, EOMI. No icterus. Conjunctiva pink. Ears:  Normal auditory acuity. Neck:  Supple Throat: Oral cavity and pharynx without inflammation, swelling or lesion.  Lungs: Respirations even and unlabored. Mild crackles bilaterally, no wheezing Heart: Normal S1, S2. No MRG. Regular rate and rhythm. No peripheral edema, cyanosis or pallor.  Abdomen:  Tense with marked distention, nontender. No rebound or guarding. Normal bowel sounds. Due to distention, could not appreciate masses or hepatomegaly. Rectal:  Not performed.  Msk:  Symmetrical without gross deformities. Peripheral pulses intact.  Extremities:  Without edema, no deformity or joint abnormality. Normal ROM Neurologic:  Alert and  oriented x3;  grossly normal neurologically. CN II-XII intact.  Skin:   Dry and intact without significant lesions or  rashes. Psychiatric: Oriented to person, place and time. Demonstrates good judgement and reason without abnormal affect or behaviors. Some memory impairment   LAB RESULTS:  Recent Labs  03/14/16 0222  WBC 13.9*  HGB 7.1*  HCT 21.1*  PLT 190   BMET  Recent Labs  03/14/16 0222  NA 125*  K 3.4*  CL 85*  CO2 24  GLUCOSE 116*  BUN 12  CREATININE 0.62  CALCIUM 8.5*   LFT  Recent Labs  03/14/16 0222  PROT 6.6  ALBUMIN 3.0*  AST 138*  ALT 28  ALKPHOS 655*  BILITOT 2.4*   PT/INR  Recent Labs  03/14/16 0222  LABPROT 15.0  INR 1.16    STUDIES: Dg Chest 2 View  03/14/2016  CLINICAL DATA:  55 year old male with abdominal pain EXAM: CHEST  2 VIEW COMPARISON:  Chest radiograph dated 08/04/2004 FINDINGS: There is a shallow inspiration. Focal area of opacity at the right lung base posteriorly may represent atelectasis or pneumonia. A small right pleural effusion may be present. There is no pneumothorax. The cardiac silhouette is within normal limits. No acute osseous pathology. IMPRESSION: Focal right lung base atelectasis versus infiltrate. Clinical correlation and follow-up recommended. Electronically Signed   By: Anner Crete M.D.   On: 03/14/2016 02:39   Ct Abdomen Pelvis W Contrast  03/14/2016  CLINICAL DATA:  Left upper quadrant and periumbilical pain for 2 days. Abdominal distention. Nausea and vomiting. Elevated white cell count. Recent diagnosis of prostate cancer. EXAM: CT ABDOMEN AND PELVIS WITH CONTRAST TECHNIQUE: Multidetector CT imaging of the abdomen and pelvis was performed using the standard protocol following bolus administration of intravenous contrast. CONTRAST:  4mL ISOVUE-300 IOPAMIDOL (ISOVUE-300) INJECTION 61% COMPARISON:  None. FINDINGS: Small bilateral pleural effusions with basilar atelectasis, greater on the left. Emphysematous changes and fibrosis in the lung bases. Coronary artery calcifications. Small esophageal hiatal hernia. Diffuse fatty  infiltration of the liver. Sub cm low-attenuation lesions in the liver are too small to characterize but likely represent cysts. Recanalization of the periumbilical veins. Calcifications in the left lobe of the liver, likely dystrophic. Probable ectatic cirrhosis with enlarged lateral segment left lobe and mild nodularity to the liver contour. Spleen size is normal. Moderate amount of free fluid throughout the abdomen and pelvis, likely representing ascites. Cystic lesion in the uncinate process of the pancreas measuring 1.5 cm diameter. Suggest follow-up in 1 year with MRI. Left adrenal gland nodule measuring 1 cm diameter. This is indeterminate but statistically likely represents an adenoma. Scattered lymph nodes in the retroperitoneum are not pathologically enlarged. Diffuse calcification of abdominal aorta without aneurysm. Inferior vena cava and kidneys are unremarkable. Stomach, small bowel, and colon are not abnormally distended. No free air in the abdomen. Pelvis: Diverticula in the sigmoid colon without evidence of diverticulitis. Prostate gland is enlarged at 4.1 cm diameter. Bladder is mostly decompressed. No pelvic mass or lymphadenopathy. Appendix is normal. Degenerative changes in the spine and hips. No destructive bone lesions. IMPRESSION: Hepatic cirrhosis and fatty infiltration with recanalization of the periumbilical veins. Moderate diffuse fluid throughout the abdomen and pelvis likely due to ascites. Bilateral pleural effusions with basilar atelectasis. 1.5 cm low-attenuation lesion in the uncinate process of the pancreas. Suggest follow-up MRI  in 1 year. Electronically Signed   By: Lucienne Capers M.D.   On: 03/14/2016 05:23     PREVIOUS ENDOSCOPIES:            none   Impression / Plan:  Impression: 1. Cirrhosis: In setting of alcohol and fatty liver, tense ascites at time of exam, sodium decreased at 125, alkaline phosphatase elevated at 02/07/1955, AST at 138 with normal ALT at 28,  total bili 2.8-labs consistent with likely alcoholic cirrhosis 2. Hyponatremia: See above; likely secondary to cirrhosis 3. Anemia: Last hemoglobin on record was 11, currently 7.1; vague history of possible rectal bleeding, though occult blood studies and ER were negative 4. Acute hypoxic respiratory failure: ascites likely contributing, though with abnormal chest x-ray, is likely multifactorial 5. Alcohol use 6. Abnormal CT Abd/Pelvis: Cirrhosis-discussed above; also low attenuating lesion in uncinate process of pancreas  Plan:  1. Agree with ultrasound-guided paracentesis with removal of 3-4 L of fluid. Recommend replacing with 25 g of albumin either right before or after paracentesis, Agree with sending fluid for cell count and culture as well as albumin  2. Continue supportive measures 3. Observe for further episodes of hematochezia and acute blood loss; monitor hemoglobin with transfusion less than 7 4. Agree with empiric antibiotics 5. Will also need to discuss follow up of lesion seen in pancreas 6. Will discuss above with Dr. [name], please await any further recommendations  Thank you for your kind consultation, we will continue to follow.  Lavone Nian [facility]  03/14/2016, 10:43 AM Pager #: [phone]

## 2016-03-14 NOTE — Procedures (Signed)
Ultrasound-guided diagnostic and therapeutic paracentesis performed yielding 3 liters of clear, yellow fluid. No immediate complications. A portion of the fluid was submitted to the lab for preordered studies.

## 2016-03-14 NOTE — Progress Notes (Signed)
Initial Nutrition Assessment  DOCUMENTATION CODES:   Severe malnutrition in context of chronic illness  INTERVENTION:  -Ensure Enlive po TID, each supplement provides 350 kcal and 20 grams of protein -RD to continue to monitor  NUTRITION DIAGNOSIS:   Malnutrition related to chronic illness as evidenced by severe depletion of body fat, severe depletion of muscle mass.  GOAL:   Patient will meet greater than or equal to 90% of their needs  MONITOR:   PO intake, I & O's, Labs, Weight trends, Skin  REASON FOR ASSESSMENT:   Consult Assessment of nutrition requirement/status  ASSESSMENT:   Adam Marshall is a 55 y.o. male with medical history significant of Fatty liver, alcohol use, HTN, Gout who presents complain of abdominal distension that started 2 weeks ago. He relates that distension got worse day prior to admission. He also report having to use pampers because he is not able to control his bowel movement. He denies abdominal pain. He does relates SOB, cough, nausea and vomitin  Spoke with Mr. Norby at bedside. He endorses vomiting for 2 days, but poor appetite for 2 weeks. During this time he was doing mostly snacks throughout the day, but history of alcoholism leads me to believe he was supplementing there. Patient would not discuss. Cirrhosis of Liver has resulted in recurring ascites and paracentesis.  Did not receive a tray this morning, was NPO for paracentesis. Denied chewing/swallowing problems. Did endorse some nausea, receiving medication for. Denied vomiting today but had bag at bedside. Claims usual wt of 135#, currently 119. Per chart review, patient hasn't been 135# since 2013. He also exhibited bright red blood from rectum PTA.  Nutrition-Focused physical exam completed. Findings are severe fat depletion, severe muscle depletion, and no edema.   Labs and Medications reviewed: Na 125, K 3.4, Mg 1.4 Folic Acid, B1  Diet Order:  Diet full liquid Room  service appropriate?: Yes; Fluid consistency:: Thin  Skin:  Reviewed, no issues  Last BM:  7/8  Height:   Ht Readings from Last 1 Encounters:  03/14/16 5' 5.5" (1.664 m)    Weight:   Wt Readings from Last 1 Encounters:  03/14/16 119 lb 11.4 oz (54.3 kg)    Ideal Body Weight:  63.18 kg  BMI:  Body mass index is 19.61 kg/(m^2).  Estimated Nutritional Needs:   Kcal:  1650-1900 calories  Protein:  65-80 grams  Fluid:  >/= 1.65L  EDUCATION NEEDS:   No education needs identified at this time  Robin Babinec. Lamarr Feenstra, MS, RD LDN Inpatient Clinical Dietitian Pager [phone]

## 2016-03-14 NOTE — Progress Notes (Signed)
Dr Tyrell Antonio notifed of temperature 100.9 orally. Tylenol ordered.Also ,notifed of EKG results.

## 2016-03-14 NOTE — Progress Notes (Signed)
PHARMACY NOTE -  ANTIBIOTIC RENAL DOSE ADJUSTMENT   Request received for Pharmacy to assist with antibiotic renal dose adjustment.  Patient has been initiated on levofloxacin for CAP.  SCr 0.62, estimated CrCl 40ml/min  Plan:  Current dosage is appropriate and need for further dosage adjustment appears unlikely at present. Will sign off at this time.   Thank you,  Doreene Eland, PharmD, BCPS.   Pager: [phone] 03/14/2016 7:52 AM

## 2016-03-14 NOTE — Progress Notes (Signed)
Patient came back from IR s/p paracentesis, patient stable and no complaints,

## 2016-03-15 ENCOUNTER — Encounter (HOSPITAL_COMMUNITY): Payer: Self-pay | Admitting: Anesthesiology

## 2016-03-15 ENCOUNTER — Inpatient Hospital Stay (HOSPITAL_COMMUNITY): Payer: Medicare HMO | Admitting: Certified Registered"

## 2016-03-15 ENCOUNTER — Encounter (HOSPITAL_COMMUNITY)
Admission: EM | Disposition: A | Discharge status: Skilled Nursing Facility | Payer: Self-pay | Source: Home / Self Care | Attending: Internal Medicine

## 2016-03-15 DIAGNOSIS — R188 Other ascites: Secondary | ICD-10-CM

## 2016-03-15 DIAGNOSIS — K921 Melena: Secondary | ICD-10-CM | POA: Insufficient documentation

## 2016-03-15 DIAGNOSIS — K7460 Unspecified cirrhosis of liver: Secondary | ICD-10-CM

## 2016-03-15 DIAGNOSIS — K7031 Alcoholic cirrhosis of liver with ascites: Principal | ICD-10-CM

## 2016-03-15 DIAGNOSIS — Z789 Other specified health status: Secondary | ICD-10-CM

## 2016-03-15 HISTORY — PX: ESOPHAGOGASTRODUODENOSCOPY: SHX5428

## 2016-03-15 LAB — TYPE AND SCREEN
ABO/RH(D): AB POS
Antibody Screen: NEGATIVE
Unit division: 0
Unit division: 0

## 2016-03-15 LAB — COMPREHENSIVE METABOLIC PANEL
ALK PHOS: 458 U/L — AB (ref 38–126)
ALT: 22 U/L (ref 17–63)
AST: 99 U/L — AB (ref 15–41)
Albumin: 2.8 g/dL — ABNORMAL LOW (ref 3.5–5.0)
Anion gap: 10 (ref 5–15)
BILIRUBIN TOTAL: 5.8 mg/dL — AB (ref 0.3–1.2)
BUN: 18 mg/dL (ref 6–20)
CALCIUM: 8.4 mg/dL — AB (ref 8.9–10.3)
CO2: 27 mmol/L (ref 22–32)
CREATININE: 0.68 mg/dL (ref 0.61–1.24)
Chloride: 89 mmol/L — ABNORMAL LOW (ref 101–111)
GFR calc Af Amer: >60 mL/min (ref >60)
GLUCOSE: 98 mg/dL (ref 65–99)
POTASSIUM: 3.4 mmol/L — AB (ref 3.5–5.1)
Sodium: 126 mmol/L — ABNORMAL LOW (ref 135–145)
TOTAL PROTEIN: 5.9 g/dL — AB (ref 6.5–8.1)

## 2016-03-15 LAB — CBC AND DIFFERENTIAL
HCT: 32 % — AB (ref 41–53)
Hemoglobin: 11.1 g/dL — AB (ref 13.5–17.5)
Platelets: 122 10*3/uL — AB (ref 150–399)
WBC: 10.1 10^3/mL

## 2016-03-15 LAB — GLUCOSE, CAPILLARY: GLUCOSE-CAPILLARY: 73 mg/dL (ref 65–99)

## 2016-03-15 LAB — HEPATIC FUNCTION PANEL
ALK PHOS: 458 U/L — AB (ref 25–125)
ALT: 22 U/L (ref 10–40)
AST: 99 U/L — AB (ref 14–40)
BILIRUBIN, TOTAL: 5.8 mg/dL

## 2016-03-15 LAB — BASIC METABOLIC PANEL
BUN: 18 mg/dL (ref 4–21)
Creatinine: 0.7 mg/dL (ref 0.6–1.3)
Glucose: 98 mg/dL
Potassium: 3.4 mmol/L (ref 3.4–5.3)
Sodium: 126 mmol/L — AB (ref 137–147)

## 2016-03-15 LAB — HEMOGLOBIN: Hemoglobin: 11.6 g/dL — ABNORMAL LOW (ref 13.0–17.0)

## 2016-03-15 LAB — EXPECTORATED SPUTUM ASSESSMENT W GRAM STAIN, RFLX TO RESP C

## 2016-03-15 LAB — EXPECTORATED SPUTUM ASSESSMENT W REFEX TO RESP CULTURE

## 2016-03-15 LAB — HIV ANTIBODY (ROUTINE TESTING W REFLEX): HIV Screen 4th Generation wRfx: NONREACTIVE

## 2016-03-15 LAB — OCCULT BLOOD X 1 CARD TO LAB, STOOL: FECAL OCCULT BLD: POSITIVE — AB

## 2016-03-15 SURGERY — EGD (ESOPHAGOGASTRODUODENOSCOPY)
Anesthesia: Monitor Anesthesia Care

## 2016-03-15 MED ORDER — OCTREOTIDE LOAD VIA INFUSION
50.0000 ug | Freq: Once | INTRAVENOUS | Status: AC
  Administered 2016-03-15: 50 ug via INTRAVENOUS
  Filled 2016-03-15: qty 25

## 2016-03-15 MED ORDER — LIDOCAINE HCL (CARDIAC) 20 MG/ML IV SOLN
INTRAVENOUS | Status: DC | PRN
  Administered 2016-03-15: 50 mg via INTRAVENOUS

## 2016-03-15 MED ORDER — SODIUM CHLORIDE 0.9 % IV SOLN: INTRAVENOUS | Status: DC

## 2016-03-15 MED ORDER — POTASSIUM CHLORIDE 10 MEQ/100ML IV SOLN
10.0000 meq | Freq: Once | INTRAVENOUS | Status: AC
  Administered 2016-03-19: 10 meq via INTRAVENOUS
  Filled 2016-03-15: qty 100

## 2016-03-15 MED ORDER — PROPOFOL 10 MG/ML IV BOLUS
INTRAVENOUS | Status: AC
  Filled 2016-03-15: qty 20

## 2016-03-15 MED ORDER — SODIUM CHLORIDE 0.9 % IV SOLN
50.0000 ug/h | INTRAVENOUS | Status: DC
  Administered 2016-03-15: 50 ug/h via INTRAVENOUS
  Filled 2016-03-15 (×2): qty 1

## 2016-03-15 MED ORDER — PANTOPRAZOLE SODIUM 40 MG PO TBEC
40.0000 mg | DELAYED_RELEASE_TABLET | Freq: Two times a day (BID) | ORAL | Status: DC
  Administered 2016-03-15 – 2016-03-19 (×6): 40 mg via ORAL
  Filled 2016-03-15 (×8): qty 1

## 2016-03-15 MED ORDER — LIDOCAINE HCL (CARDIAC) 20 MG/ML IV SOLN
INTRAVENOUS | Status: AC
  Filled 2016-03-15: qty 5

## 2016-03-15 MED ORDER — FENTANYL CITRATE (PF) 100 MCG/2ML IJ SOLN: 25.0000 ug | INTRAMUSCULAR | Status: DC | PRN

## 2016-03-15 MED ORDER — PROPOFOL 10 MG/ML IV BOLUS
INTRAVENOUS | Status: DC | PRN
  Administered 2016-03-15: 40 mg via INTRAVENOUS
  Administered 2016-03-15: 50 mg via INTRAVENOUS

## 2016-03-15 MED ORDER — LORAZEPAM 1 MG PO TABS
1.0000 mg | ORAL_TABLET | ORAL | Status: DC | PRN
  Administered 2016-03-15: 1 mg via ORAL
  Filled 2016-03-15: qty 1

## 2016-03-15 MED ORDER — LACTATED RINGERS IV SOLN: INTRAVENOUS | Status: DC

## 2016-03-15 MED ORDER — ONDANSETRON HCL 4 MG/2ML IJ SOLN: 4.0000 mg | Freq: Once | INTRAMUSCULAR | Status: DC | PRN

## 2016-03-15 NOTE — Anesthesia Preprocedure Evaluation (Addendum)
Anesthesia Evaluation  Patient identified by MRN, date of birth, ID band Patient awake    Reviewed: Allergy & Precautions, NPO status , Patient's Chart, lab work & pertinent test results, reviewed documented beta blocker date and time   Airway Mallampati: II  TM Distance: >3 FB Neck ROM: Full    Dental no notable dental hx. (+) Teeth Intact   Pulmonary Current Smoker,    Pulmonary exam normal breath sounds clear to auscultation       Cardiovascular hypertension, Pt. on medications and Pt. on home beta blockers  Rhythm:Regular Rate:Tachycardia     Neuro/Psych negative neurological ROS  negative psych ROS   GI/Hepatic (+) Cirrhosis   ascites  substance abuse  alcohol use, Chronic pancreatitis Melena   Endo/Other  Gout  Renal/GU negative Renal ROS  negative genitourinary   Musculoskeletal Chronic Knee and back pain Gout    Abdominal (+) + scaphoid   Peds  Hematology  (+) anemia ,   Anesthesia Other Findings   Reproductive/Obstetrics                           Anesthesia Physical Anesthesia Plan  ASA: III  Anesthesia Plan: MAC   Post-op Pain Management:    Induction: Intravenous  Airway Management Planned: Natural Airway and Simple Face Mask  Additional Equipment:   Intra-op Plan:   Post-operative Plan:   Informed Consent: I have reviewed the patients History and Physical, chart, labs and discussed the procedure including the risks, benefits and alternatives for the proposed anesthesia with the patient or authorized representative who has indicated his/her understanding and acceptance.   Dental advisory given  Plan Discussed with: CRNA, Anesthesiologist and Surgeon  Anesthesia Plan Comments:         Anesthesia Quick Evaluation

## 2016-03-15 NOTE — Anesthesia Postprocedure Evaluation (Signed)
Anesthesia Post Note  Patient: Adam Marshall  Procedure(s) Performed: Procedure(s) (LRB): ESOPHAGOGASTRODUODENOSCOPY (EGD) (N/A)  Patient location during evaluation: PACU Anesthesia Type: General Level of consciousness: awake and alert and oriented Pain management: pain level controlled Vital Signs Assessment: post-procedure vital signs reviewed and stable Respiratory status: spontaneous breathing, nonlabored ventilation and respiratory function stable Cardiovascular status: blood pressure returned to baseline and stable Postop Assessment: no signs of nausea or vomiting Anesthetic complications: no    Last Vitals:  Filed Vitals:   03/15/16 1200 03/15/16 1358  BP: 121/87 124/95  Pulse:  122  Temp: 37.6 C 37.8 C  Resp: 18     Last Pain:  Filed Vitals:   03/15/16 1359  PainSc: 0-No pain                 Surina Storts A.

## 2016-03-15 NOTE — Interval H&P Note (Signed)
History and Physical Interval Note:  03/15/2016 2:05 PM  Adam Marshall  has presented today for surgery, with the diagnosis of melena  The various methods of treatment have been discussed with the patient and family. After consideration of risks, benefits and other options for treatment, the patient has consented to  Procedure(s): ESOPHAGOGASTRODUODENOSCOPY (EGD) (N/A) as a surgical intervention .  The patient's history has been reviewed, patient examined, no change in status, stable for surgery.  I have reviewed the patient's chart and labs.  Questions were answered to the patient's satisfaction.     [name]

## 2016-03-15 NOTE — Progress Notes (Signed)
Pt to endoscopy for EGD and the will be transferred to [facility]. Full report given to [employer]. Pt stable for transport.

## 2016-03-15 NOTE — Progress Notes (Signed)
Dr. Cathlean Sauer aware via phone Sandostatin infusion completed in Epic. No new orders to continue received.

## 2016-03-15 NOTE — Transfer of Care (Signed)
Immediate Anesthesia Transfer of Care Note  Patient: Adam Marshall  Procedure(s) Performed: Procedure(s): ESOPHAGOGASTRODUODENOSCOPY (EGD) (N/A)  Patient Location: PACU  Anesthesia Type:MAC  Level of Consciousness: awake, alert  and oriented  Airway & Oxygen Therapy: Patient Spontanous Breathing and Patient connected to nasal cannula oxygen  Post-op Assessment: Report given to RN and Post -op Vital signs reviewed and stable  Post vital signs: Reviewed and stable  Last Vitals:  Filed Vitals:   03/15/16 1200 03/15/16 1358  BP: 121/87 124/95  Pulse:  122  Temp: 37.6 C 37.8 C  Resp: 18     Last Pain:  Filed Vitals:   03/15/16 1359  PainSc: 0-No pain         Complications: No apparent anesthesia complications

## 2016-03-15 NOTE — Op Note (Signed)
[facility] Patient Name: Adam Marshall Procedure Date: 03/15/2016 MRN: [id number] Attending MD: Estill Cotta. [name] , MD Date of Birth: 1961-07-24 CSN: [id number] Age: 55 Admit Type: Inpatient Procedure:                Upper GI endoscopy Indications:              Iron deficiency anemia secondary to chronic blood                            loss, Melena, Cirrhosis Providers:                Mallie Mussel L. [name], MD, Cleda Daub, RN, Adarius Dalton, Technician Referring MD:              Medicines:                Monitored Anesthesia Care Complications:            No immediate complications. Estimated Blood Loss:     Estimated blood loss: none. Procedure:                Pre-Anesthesia Assessment:                           - Prior to the procedure, a History and Physical                            was performed, and patient medications and                            allergies were reviewed. The patient's tolerance of                            previous anesthesia was also reviewed. The risks                            and benefits of the procedure and the sedation                            options and risks were discussed with the patient.                            All questions were answered, and informed consent                            was obtained. Prior Anticoagulants: The patient has                            taken no previous anticoagulant or antiplatelet                            agents. ASA Grade Assessment: III - A patient with  severe systemic disease. After reviewing the risks                            and benefits, the patient was deemed in                            satisfactory condition to undergo the procedure.                           After obtaining informed consent, the endoscope was                            passed under direct vision. Throughout the                            procedure, the  patient's blood pressure, pulse, and                            oxygen saturations were monitored continuously. The                            [id number] scope was introduced through the                            mouth, and advanced to the second part of duodenum.                            The upper GI endoscopy was accomplished without                            difficulty. The patient tolerated the procedure                            well. Scope In: Scope Out: Findings:      LA Grade D (one or more mucosal breaks involving at least 75% of       esophageal circumference) esophagitis with no bleeding was found 30 to       40 cm from the incisors.      No esophageal varices were seen.      One non-bleeding gastric ulcer with no stigmata of bleeding was found in       the prepyloric region of the stomach. The lesion was 2 mm in largest       dimension.      No gastric varices were seen.      The cardia and gastric fundus were normal on retroflexion.      The examined duodenum was normal.      A exophytic and friable mass was found in the left pyriform sinus. The       mass is not obstructing the airway.      A small hiatal hernia was present. Impression:               - LA Grade D reflux esophagitis.                           - Non-bleeding  gastric ulcer with no stigmata of                            bleeding.                           - Normal examined duodenum.                           - Exophytic and friable mass found in the left                            pyriform sinus, partially obstructing the airway.                           - Small hiatal hernia.                           - No specimens collected. Moderate Sedation:      MAC sedation used Recommendation:           - Return patient to hospital ward for ongoing care.                           - Use Protonix (pantoprazole) 40 mg PO BID for 8                            weeks.                           -  Discontinue octreotide (done)                           - Low sodium diet.                           - PLEASE OBTAIN ENT CONSULTATION DURING THIS                            ADMISSION TO EXAMINE PIRIFORM SINUS LESION                            DISCOVERED (SEE ABOVE). This will be discussed with                            the attending physician.                           - Continue present medications except as noted                            above. Procedure Code(s):        --- Professional ---                           [phone], Esophagogastroduodenoscopy, flexible,  transoral; diagnostic, including collection of                            specimen(s) by brushing or washing, when performed                            (separate procedure) Diagnosis Code(s):        --- Professional ---                           K21.0, Gastro-esophageal reflux disease with                            esophagitis                           K25.9, Gastric ulcer, unspecified as acute or                            chronic, without hemorrhage or perforation                           J38.7, Other diseases of larynx                           D50.0, Iron deficiency anemia secondary to blood                            loss (chronic)                           K92.1, Melena (includes Hematochezia) CPT copyright 2016 [employer]. All rights reserved. The codes documented in this report are preliminary and upon coder review may  be revised to meet current compliance requirements. Shamaria Kavan L. [name], MD 03/15/2016 2:35:30 PM This report has been signed electronically. Number of Addenda: 0

## 2016-03-15 NOTE — Progress Notes (Signed)
Progress Note   Subjective  [name] is a 55 year old African-American male who was admitted on 03/14/2016 for increasing abdominal distention.  This morning, the patient is found laying in his bed. He tells me that he has had 2 bowel movements which were mostly blood. Per nursing a stool was sent early this morning and was Hemoccult positive. At time of interview the patient does have a melenic stool in his bed. He does tell me that his abdomen feels somewhat better after his paracentesis yesterday. He denies any other complaints or concerns.   Objective   Vital signs in last 24 hours: Temp:  [98.1 F (36.7 C)-99.7 F (37.6 C)] 99.7 F (37.6 C) (07/11 0800) Pulse Rate:  [120-135] 120 (07/10 1600) Resp:  [12-27] 14 (07/11 0800) BP: (120-155)/(84-105) 129/91 mmHg (07/11 0800) SpO2:  [96 %-100 %] 100 % (07/11 0800) Weight:  [114 lb 13.8 oz (52.1 kg)] 114 lb 13.8 oz (52.1 kg) (07/11 0000) Last BM Date: 03/13/16 General: African American male in NAD Heart:  Regular rate and rhythm; no murmurs Lungs: Respirations even and unlabored, lungs CTA bilaterally Abdomen:  Soft, nontender and mild distension (much improved from yesterday). Normal bowel sounds. Extremities:  Without edema. Neurologic:  Alert and oriented,  grossly normal neurologically. Psych:  Cooperative. Normal mood and affect.  Intake/Output from previous day: 07/10 0701 - 07/11 0700 In: 1367 [P.O.:387; I.V.:50; Blood:730; IV Piggyback:200] Out: 1260 [Urine:1060; Emesis/NG output:200]  Lab Results:  Recent Labs  03/14/16 0222 03/15/16 0305  WBC 13.9* 10.1  HGB 7.1* 11.1*  HCT 21.1* 31.9*  PLT 190 122*   BMET  Recent Labs  03/14/16 0222 03/15/16 0305  NA 125* 126*  K 3.4* 3.4*  CL 85* 89*  CO2 24 27  GLUCOSE 116* 98  BUN 12 18  CREATININE 0.62 0.68  CALCIUM 8.5* 8.4*   LFT  Recent Labs  03/15/16 0305  PROT 5.9*  ALBUMIN 2.8*  AST 99*  ALT 22  ALKPHOS 458*  BILITOT 5.8*    PT/INR  Recent Labs  03/14/16 0222  LABPROT 15.0  INR 1.16    Studies/Results: Dg Chest 2 View  03/14/2016  CLINICAL DATA:  55 year old male with abdominal pain EXAM: CHEST  2 VIEW COMPARISON:  Chest radiograph dated 08/04/2004 FINDINGS: There is a shallow inspiration. Focal area of opacity at the right lung base posteriorly may represent atelectasis or pneumonia. A small right pleural effusion may be present. There is no pneumothorax. The cardiac silhouette is within normal limits. No acute osseous pathology. IMPRESSION: Focal right lung base atelectasis versus infiltrate. Clinical correlation and follow-up recommended. Electronically Signed   By: Anner Crete M.D.   On: 03/14/2016 02:39   Ct Abdomen Pelvis W Contrast  03/14/2016  CLINICAL DATA:  Left upper quadrant and periumbilical pain for 2 days. Abdominal distention. Nausea and vomiting. Elevated white cell count. Recent diagnosis of prostate cancer. EXAM: CT ABDOMEN AND PELVIS WITH CONTRAST TECHNIQUE: Multidetector CT imaging of the abdomen and pelvis was performed using the standard protocol following bolus administration of intravenous contrast. CONTRAST:  37mL ISOVUE-300 IOPAMIDOL (ISOVUE-300) INJECTION 61% COMPARISON:  None. FINDINGS: Small bilateral pleural effusions with basilar atelectasis, greater on the left. Emphysematous changes and fibrosis in the lung bases. Coronary artery calcifications. Small esophageal hiatal hernia. Diffuse fatty infiltration of the liver. Sub cm low-attenuation lesions in the liver are too small to characterize but likely represent cysts. Recanalization of the periumbilical veins. Calcifications in the left lobe of the liver,  likely dystrophic. Probable ectatic cirrhosis with enlarged lateral segment left lobe and mild nodularity to the liver contour. Spleen size is normal. Moderate amount of free fluid throughout the abdomen and pelvis, likely representing ascites. Cystic lesion in the uncinate  process of the pancreas measuring 1.5 cm diameter. Suggest follow-up in 1 year with MRI. Left adrenal gland nodule measuring 1 cm diameter. This is indeterminate but statistically likely represents an adenoma. Scattered lymph nodes in the retroperitoneum are not pathologically enlarged. Diffuse calcification of abdominal aorta without aneurysm. Inferior vena cava and kidneys are unremarkable. Stomach, small bowel, and colon are not abnormally distended. No free air in the abdomen. Pelvis: Diverticula in the sigmoid colon without evidence of diverticulitis. Prostate gland is enlarged at 4.1 cm diameter. Bladder is mostly decompressed. No pelvic mass or lymphadenopathy. Appendix is normal. Degenerative changes in the spine and hips. No destructive bone lesions. IMPRESSION: Hepatic cirrhosis and fatty infiltration with recanalization of the periumbilical veins. Moderate diffuse fluid throughout the abdomen and pelvis likely due to ascites. Bilateral pleural effusions with basilar atelectasis. 1.5 cm low-attenuation lesion in the uncinate process of the pancreas. Suggest follow-up MRI in 1 year. Electronically Signed   By: Lucienne Capers M.D.   On: 03/14/2016 05:23   US Paracentesis  03/14/2016  INDICATION: Cirrhosis, ascites. Request made for diagnostic and therapeutic paracentesis. EXAM: ULTRASOUND GUIDED DIAGNOSTIC AND THERAPEUTIC PARACENTESIS MEDICATIONS: None. COMPLICATIONS: None immediate. PROCEDURE: Informed written consent was obtained from the patient after a discussion of the risks, benefits and alternatives to treatment. A timeout was performed prior to the initiation of the procedure. Initial ultrasound scanning demonstrates a moderate amount of ascites within the left lower abdominal quadrant. The left lower abdomen was prepped and draped in the usual sterile fashion. 1% lidocaine was used for local anesthesia. Following this, a Yueh catheter was introduced. An ultrasound image was saved for  documentation purposes. The paracentesis was performed. The catheter was removed and a dressing was applied. The patient tolerated the procedure well without immediate post procedural complication. FINDINGS: A total of approximately 3 liters of clear, yellow fluid was removed. Samples were sent to the laboratory as requested by the clinical team. IMPRESSION: Successful ultrasound-guided diagnostic and therapeutic paracentesis yielding 3 liters of peritoneal fluid. Read by: Rowe Robert, PA-C Electronically Signed   By: Lucrezia Europe M.D.   On: 03/14/2016 16:58   US Abdomen Limited Ruq  03/14/2016  CLINICAL DATA:  History of cirrhosis.  Abnormal transaminases. EXAM: US ABDOMEN LIMITED - RIGHT UPPER QUADRANT COMPARISON:  Abdominal ultrasound 10/20/2015 FINDINGS: Gallbladder: The gallbladder is normally distended. No gallbladder stones or sludge are seen. There is thickening and edema of the gallbladder wall measuring up to 4.6 mm in cross-section. There is a tiny amount of pericholecystic fluid, however small amount of ascites is noted in perihepatic location as well. The sonographic Murphy's sign was reported as negative. Common bile duct: Diameter: Normal in diameter measuring 5 mm, where visualized. Liver: No focal lesion identified. The liver demonstrates nodular contour and diffusely increased echogenicity. There is a small amount of perihepatic ascites. IMPRESSION: Normally distended gallbladder with edematous wall measuring up to 4.6 mm. In the settings of liver cirrhosis this may represent reactive inflammatory changes, however acalculous cholecystitis cannot be excluded. Notably, the sonographic Murphy's sign was negative. Cirrhotic appearance of the liver was small volume of abdominal ascites. Electronically Signed   By: Fidela Salisbury M.D.   On: 03/14/2016 17:44       Assessment / Plan:  Impression: 1. Decompensated Etoh Cirrhosis: At time of admission, tense ascites, sodium decreased at 125,  alkaline phosphatase elevated at 655 , AST at 138 with normal ALT at 28, total bili 2. this morning this has improved slightly with alkaline phosphatase 458, AST 99, ALT 22. Though total bilirubin has increased to 5.8; patient did have 3 L of fluid drawn off on 03/14/2016, sodium remains low at 126 2. Hyponatremia: See above; secondary to cirrhosis 3. Anemia: Last hemoglobin on record was 11, dropped to 7.1 at time of admission, patient received 2 units PRBCs on the morning of 03/14/2016 and S1 hemoglobin 11.1, Hemoccult-positive stools overnight as well as melena at time of exam is morning; consider upper GI bleed 4. Acute hypoxic respiratory failure: ascites likely contributing, though with abnormal chest x-ray, is likely multifactorial 5. Alcohol use 6. Abnormal CT Abd/Pelvis: Cirrhosis-discussed above; also low attenuating lesion in uncinate process of pancreas  Plan: 1. EGD planned for later this afternoon with Dr. [name]. Risks, benefits, limitations and alternatives procedure were discussed with the patient and he agrees to proceed. 2. Continue supportive measures 3. Ordered potassium replacement this morning 4. Will start the patient on octreotide at 50 mcg bolus then 50 mcg an hour 5. Again patient would likely benefit from outpatient rehabilitation at time of discharge for alcohol detox 6. In the future will need to discuss lesion in the pancreas and further imaging as outpatient  7. Patient to remain nothing by mouth at this time until decisions about EGD timing 8. Will discuss above with Dr. [name].  Thank you for consultation, we will continue to follow  Principal Problem:   Cirrhosis of liver with ascites ([place]) Active Problems:   HYPERTENSION, BENIGN ESSENTIAL   Alcohol use ([place])   Liver failure ([place])   Anemia   Respiratory failure with hypoxia ([place])   Lesion of pancreas   Generalized abdominal pain   Hyponatremia     LOS: 1 day   Levin Erp  03/15/2016, 9:38  AM  Pager # [phone]

## 2016-03-15 NOTE — H&P (View-Only) (Signed)
Progress Note   Subjective  [name] is a 55 year old African-American male who was admitted on 03/14/2016 for increasing abdominal distention.  This morning, the patient is found laying in his bed. He tells me that he has had 2 bowel movements which were mostly blood. Per nursing a stool was sent early this morning and was Hemoccult positive. At time of interview the patient does have a melenic stool in his bed. He does tell me that his abdomen feels somewhat better after his paracentesis yesterday. He denies any other complaints or concerns.   Objective   Vital signs in last 24 hours: Temp:  [98.1 F (36.7 C)-99.7 F (37.6 C)] 99.7 F (37.6 C) (07/11 0800) Pulse Rate:  [120-135] 120 (07/10 1600) Resp:  [12-27] 14 (07/11 0800) BP: (120-155)/(84-105) 129/91 mmHg (07/11 0800) SpO2:  [96 %-100 %] 100 % (07/11 0800) Weight:  [114 lb 13.8 oz (52.1 kg)] 114 lb 13.8 oz (52.1 kg) (07/11 0000) Last BM Date: 03/13/16 General: African American male in NAD Heart:  Regular rate and rhythm; no murmurs Lungs: Respirations even and unlabored, lungs CTA bilaterally Abdomen:  Soft, nontender and mild distension (much improved from yesterday). Normal bowel sounds. Extremities:  Without edema. Neurologic:  Alert and oriented,  grossly normal neurologically. Psych:  Cooperative. Normal mood and affect.  Intake/Output from previous day: 07/10 0701 - 07/11 0700 In: 1367 [P.O.:387; I.V.:50; Blood:730; IV Piggyback:200] Out: 1260 [Urine:1060; Emesis/NG output:200]  Lab Results:  Recent Labs  03/14/16 0222 03/15/16 0305  WBC 13.9* 10.1  HGB 7.1* 11.1*  HCT 21.1* 31.9*  PLT 190 122*   BMET  Recent Labs  03/14/16 0222 03/15/16 0305  NA 125* 126*  K 3.4* 3.4*  CL 85* 89*  CO2 24 27  GLUCOSE 116* 98  BUN 12 18  CREATININE 0.62 0.68  CALCIUM 8.5* 8.4*   LFT  Recent Labs  03/15/16 0305  PROT 5.9*  ALBUMIN 2.8*  AST 99*  ALT 22  ALKPHOS 458*  BILITOT 5.8*    PT/INR  Recent Labs  03/14/16 0222  LABPROT 15.0  INR 1.16    Studies/Results: Dg Chest 2 View  03/14/2016  CLINICAL DATA:  55 year old male with abdominal pain EXAM: CHEST  2 VIEW COMPARISON:  Chest radiograph dated 08/04/2004 FINDINGS: There is a shallow inspiration. Focal area of opacity at the right lung base posteriorly may represent atelectasis or pneumonia. A small right pleural effusion may be present. There is no pneumothorax. The cardiac silhouette is within normal limits. No acute osseous pathology. IMPRESSION: Focal right lung base atelectasis versus infiltrate. Clinical correlation and follow-up recommended. Electronically Signed   By: Anner Crete M.D.   On: 03/14/2016 02:39   Ct Abdomen Pelvis W Contrast  03/14/2016  CLINICAL DATA:  Left upper quadrant and periumbilical pain for 2 days. Abdominal distention. Nausea and vomiting. Elevated white cell count. Recent diagnosis of prostate cancer. EXAM: CT ABDOMEN AND PELVIS WITH CONTRAST TECHNIQUE: Multidetector CT imaging of the abdomen and pelvis was performed using the standard protocol following bolus administration of intravenous contrast. CONTRAST:  29mL ISOVUE-300 IOPAMIDOL (ISOVUE-300) INJECTION 61% COMPARISON:  None. FINDINGS: Small bilateral pleural effusions with basilar atelectasis, greater on the left. Emphysematous changes and fibrosis in the lung bases. Coronary artery calcifications. Small esophageal hiatal hernia. Diffuse fatty infiltration of the liver. Sub cm low-attenuation lesions in the liver are too small to characterize but likely represent cysts. Recanalization of the periumbilical veins. Calcifications in the left lobe of the liver,  likely dystrophic. Probable ectatic cirrhosis with enlarged lateral segment left lobe and mild nodularity to the liver contour. Spleen size is normal. Moderate amount of free fluid throughout the abdomen and pelvis, likely representing ascites. Cystic lesion in the uncinate  process of the pancreas measuring 1.5 cm diameter. Suggest follow-up in 1 year with MRI. Left adrenal gland nodule measuring 1 cm diameter. This is indeterminate but statistically likely represents an adenoma. Scattered lymph nodes in the retroperitoneum are not pathologically enlarged. Diffuse calcification of abdominal aorta without aneurysm. Inferior vena cava and kidneys are unremarkable. Stomach, small bowel, and colon are not abnormally distended. No free air in the abdomen. Pelvis: Diverticula in the sigmoid colon without evidence of diverticulitis. Prostate gland is enlarged at 4.1 cm diameter. Bladder is mostly decompressed. No pelvic mass or lymphadenopathy. Appendix is normal. Degenerative changes in the spine and hips. No destructive bone lesions. IMPRESSION: Hepatic cirrhosis and fatty infiltration with recanalization of the periumbilical veins. Moderate diffuse fluid throughout the abdomen and pelvis likely due to ascites. Bilateral pleural effusions with basilar atelectasis. 1.5 cm low-attenuation lesion in the uncinate process of the pancreas. Suggest follow-up MRI in 1 year. Electronically Signed   By: Lucienne Capers M.D.   On: 03/14/2016 05:23   US Paracentesis  03/14/2016  INDICATION: Cirrhosis, ascites. Request made for diagnostic and therapeutic paracentesis. EXAM: ULTRASOUND GUIDED DIAGNOSTIC AND THERAPEUTIC PARACENTESIS MEDICATIONS: None. COMPLICATIONS: None immediate. PROCEDURE: Informed written consent was obtained from the patient after a discussion of the risks, benefits and alternatives to treatment. A timeout was performed prior to the initiation of the procedure. Initial ultrasound scanning demonstrates a moderate amount of ascites within the left lower abdominal quadrant. The left lower abdomen was prepped and draped in the usual sterile fashion. 1% lidocaine was used for local anesthesia. Following this, a Yueh catheter was introduced. An ultrasound image was saved for  documentation purposes. The paracentesis was performed. The catheter was removed and a dressing was applied. The patient tolerated the procedure well without immediate post procedural complication. FINDINGS: A total of approximately 3 liters of clear, yellow fluid was removed. Samples were sent to the laboratory as requested by the clinical team. IMPRESSION: Successful ultrasound-guided diagnostic and therapeutic paracentesis yielding 3 liters of peritoneal fluid. Read by: Rowe Robert, PA-C Electronically Signed   By: Lucrezia Europe M.D.   On: 03/14/2016 16:58   US Abdomen Limited Ruq  03/14/2016  CLINICAL DATA:  History of cirrhosis.  Abnormal transaminases. EXAM: US ABDOMEN LIMITED - RIGHT UPPER QUADRANT COMPARISON:  Abdominal ultrasound 10/20/2015 FINDINGS: Gallbladder: The gallbladder is normally distended. No gallbladder stones or sludge are seen. There is thickening and edema of the gallbladder wall measuring up to 4.6 mm in cross-section. There is a tiny amount of pericholecystic fluid, however small amount of ascites is noted in perihepatic location as well. The sonographic Murphy's sign was reported as negative. Common bile duct: Diameter: Normal in diameter measuring 5 mm, where visualized. Liver: No focal lesion identified. The liver demonstrates nodular contour and diffusely increased echogenicity. There is a small amount of perihepatic ascites. IMPRESSION: Normally distended gallbladder with edematous wall measuring up to 4.6 mm. In the settings of liver cirrhosis this may represent reactive inflammatory changes, however acalculous cholecystitis cannot be excluded. Notably, the sonographic Murphy's sign was negative. Cirrhotic appearance of the liver was small volume of abdominal ascites. Electronically Signed   By: Fidela Salisbury M.D.   On: 03/14/2016 17:44       Assessment / Plan:  Impression: 1. Decompensated Etoh Cirrhosis: At time of admission, tense ascites, sodium decreased at 125,  alkaline phosphatase elevated at 655 , AST at 138 with normal ALT at 28, total bili 2. this morning this has improved slightly with alkaline phosphatase 458, AST 99, ALT 22. Though total bilirubin has increased to 5.8; patient did have 3 L of fluid drawn off on 03/14/2016, sodium remains low at 126 2. Hyponatremia: See above; secondary to cirrhosis 3. Anemia: Last hemoglobin on record was 11, dropped to 7.1 at time of admission, patient received 2 units PRBCs on the morning of 03/14/2016 and S1 hemoglobin 11.1, Hemoccult-positive stools overnight as well as melena at time of exam is morning; consider upper GI bleed 4. Acute hypoxic respiratory failure: ascites likely contributing, though with abnormal chest x-ray, is likely multifactorial 5. Alcohol use 6. Abnormal CT Abd/Pelvis: Cirrhosis-discussed above; also low attenuating lesion in uncinate process of pancreas  Plan: 1. EGD planned for later this afternoon with Dr. [name]. Risks, benefits, limitations and alternatives procedure were discussed with the patient and he agrees to proceed. 2. Continue supportive measures 3. Ordered potassium replacement this morning 4. Will start the patient on octreotide at 50 mcg bolus then 50 mcg an hour 5. Again patient would likely benefit from outpatient rehabilitation at time of discharge for alcohol detox 6. In the future will need to discuss lesion in the pancreas and further imaging as outpatient  7. Patient to remain nothing by mouth at this time until decisions about EGD timing 8. Will discuss above with Dr. [name].  Thank you for consultation, we will continue to follow  Principal Problem:   Cirrhosis of liver with ascites ([address]) Active Problems:   HYPERTENSION, BENIGN ESSENTIAL   Alcohol use ([place])   Liver failure ([place])   Anemia   Respiratory failure with hypoxia ([place])   Lesion of pancreas   Generalized abdominal pain   Hyponatremia     LOS: 1 day   Levin Erp  03/15/2016, 9:38  AM  Pager # [phone]

## 2016-03-15 NOTE — Progress Notes (Signed)
PROGRESS NOTE    Adam Marshall  [id number] DOB: [birth year] DOA: 03/14/2016 PCP: [name], FNP    Brief Narrative:  New onset acitis. 55 yo male with etho abuse. S/p paracentesis.   Assessment & Plan:   Principal Problem:   Cirrhosis of liver with ascites ([facility]) Active Problems:   HYPERTENSION, BENIGN ESSENTIAL   Alcohol use ([facility])   Liver failure ([facility])   Anemia   Respiratory failure with hypoxia ([facility])   Lesion of pancreas   Generalized abdominal pain   Hyponatremia   1. Cardiovascular. Patient hemodynamic stable, no signs of systemic infection  2. Pulmonary. Hypoxic respiratory failure Chest film personally reviewed noted atelectasis at bases but no frank infiltrate, will d.c antibiotics for now. Hypoxemia due to atelectasis related to increase abdominal pressure related to ascitis.  3, Nephrology. Renal function stable with cr at 0.68 with K at 3.4  Hyponatremia. Na at 126, probably related to hypervolemia and ascites, related to liver disease. Will continue to hold on IV fluids and continue po intake as tolerated, with fluid restriction. Follow renal panel in am.   4. Gastroenterology. Decompensated chronic liver failure. Possible alcohol related, will continue supportive care, ascitic fluid with no signs of infection, started on octreotide, for possible varices, will hold on diuretics for now.   5 Neurology. Alcohol abuse. Will continue neuro checks per unit protocol, will change IV lorazepam to po lorazepam as needed,. Risk for withdrawal.   Patient at moderate risk for worsening liver failure decompensation.   DVT prophylaxis: Code: full Family Communication:  Disposition Plan:   Consultants:   Procedures:  Parecentesis 3 lt (03/14/16)   Antimicrobials:    Subjective: Patient with abdominal distention, improved after paracentesis, still persistent, no worsening factors, no associated pain, nausea or vomiting. Patient tolerating po well.    Objective: Filed Vitals:   03/14/16 1700 03/14/16 2000 03/15/16 0000 03/15/16 0400  BP: 127/85 130/92 128/89 128/98  Pulse:      Temp:  98.7 F (37.1 C) 98.7 F (37.1 C) 98.8 F (37.1 C)  TempSrc:  Oral Oral Oral  Resp:  18 12 14   Height:      Weight:   52.1 kg (114 lb 13.8 oz)   SpO2:  100% 100% 99%    Intake/Output Summary (Last 24 hours) at 03/15/16 0812 Last data filed at 03/15/16 0500  Gross per 24 hour  Intake   1367 ml  Output   1260 ml  Net    107 ml   Filed Weights   03/14/16 0741 03/14/16 0856 03/15/16 0000  Weight: 49.896 kg (110 lb) 54.3 kg (119 lb 11.4 oz) 52.1 kg (114 lb 13.8 oz)    Examination:  General exam: deconditioned E ENT: pale conjunctivae, oral mucosa dry Respiratory system:  Decreased breath sounds at bases due to poor inspiratory effort, no wheezing, rales or rhonchi.  Cardiovascular system: S1 & S2 heard, RRR. Tachycardic. No JVD, murmurs, rubs, gallops or clicks. No pedal edema. Gastrointestinal system: Abdomen mild distended, dull to percussion at the dependent zones, soft and nontender. No organomegaly or masses felt. Normal bowel sounds heard. Central nervous system: Alert and oriented. No focal neurological deficits. Extremities: Symmetric 5 x 5 power. Skin: No rashes, lesions or ulcers   Data Reviewed: I have personally reviewed following labs and imaging studies  CBC:  Recent Labs Lab 03/14/16 0222 03/15/16 0305  WBC 13.9* 10.1  NEUTROABS 11.6*  --   HGB 7.1* 11.1*  HCT 21.1* 31.9*  MCV  95.0 90.1  PLT 190 123XX123*   Basic Metabolic Panel:  Recent Labs Lab 03/14/16 0222 03/14/16 0736 03/15/16 0305  NA 125*  --  126*  K 3.4*  --  3.4*  CL 85*  --  89*  CO2 24  --  27  GLUCOSE 116*  --  98  BUN 12  --  18  CREATININE 0.62  --  0.68  CALCIUM 8.5*  --  8.4*  MG  --  1.4*  --    GFR: Estimated Creatinine Clearance: 76 mL/min (by C-G formula based on Cr of 0.68). Liver Function Tests:  Recent Labs Lab  03/14/16 0222 03/15/16 0305  AST 138* 99*  ALT 28 22  ALKPHOS 655* 458*  BILITOT 2.4* 5.8*  PROT 6.6 5.9*  ALBUMIN 3.0* 2.8*    Recent Labs Lab 03/14/16 0222  LIPASE 47   No results for input(s): AMMONIA in the last 168 hours. Coagulation Profile:  Recent Labs Lab 03/14/16 0222  INR 1.16   Cardiac Enzymes: No results for input(s): CKTOTAL, CKMB, CKMBINDEX, TROPONINI in the last 168 hours. BNP (last 3 results) No results for input(s): PROBNP in the last 8760 hours. HbA1C: No results for input(s): HGBA1C in the last 72 hours. CBG: No results for input(s): GLUCAP in the last 168 hours. Lipid Profile: No results for input(s): CHOL, HDL, LDLCALC, TRIG, CHOLHDL, LDLDIRECT in the last 72 hours. Thyroid Function Tests: No results for input(s): TSH, T4TOTAL, FREET4, T3FREE, THYROIDAB in the last 72 hours. Anemia Panel:  Recent Labs  03/14/16 0736  VITAMINB12 284  FOLATE 14.0  FERRITIN 163  TIBC 269  IRON 57  RETICCTPCT 3.8*   Sepsis Labs: No results for input(s): PROCALCITON, LATICACIDVEN in the last 168 hours.  Recent Results (from the past 240 hour(s))  MRSA PCR Screening     Status: None   Collection Time: 03/14/16  9:00 AM  Result Value Ref Range Status   MRSA by PCR NEGATIVE NEGATIVE Final    Comment:        The GeneXpert MRSA Assay (FDA approved for NASAL specimens only), is one component of a comprehensive MRSA colonization surveillance program. It is not intended to diagnose MRSA infection nor to guide or monitor treatment for MRSA infections.   Culture, blood (routine x 2) Call MD if unable to obtain prior to antibiotics being given     Status: None (Preliminary result)   Collection Time: 03/14/16  9:13 AM  Result Value Ref Range Status   Specimen Description BLOOD RIGHT ARM  Final   Special Requests BOTTLES DRAWN AEROBIC AND ANAEROBIC 5CC  Final   Culture PENDING  Incomplete   Report Status PENDING  Incomplete  Gram stain     Status: None    Collection Time: 03/14/16  5:28 PM  Result Value Ref Range Status   Specimen Description FLUID ASCITIC  Final   Special Requests NONE  Final   Gram Stain   Final    FEW WBC PRESENT, PREDOMINANTLY MONONUCLEAR NO ORGANISMS SEEN Performed at [facility]    Report Status 03/14/2016 FINAL  Final  Culture, sputum-assessment     Status: None   Collection Time: 03/15/16 12:58 AM  Result Value Ref Range Status   Specimen Description SPUTUM  Final   Special Requests NONE  Final   Sputum evaluation   Final    MICROSCOPIC FINDINGS SUGGEST THAT THIS SPECIMEN IS NOT REPRESENTATIVE OF LOWER RESPIRATORY SECRETIONS. PLEASE RECOLLECT. SPOKE TO H.PENG RN [id number]  A.QUIZON    Report Status 03/15/2016 FINAL  Final         Radiology Studies: Dg Chest 2 View  03/14/2016  CLINICAL DATA:  55 year old male with abdominal pain EXAM: CHEST  2 VIEW COMPARISON:  Chest radiograph dated 08/04/2004 FINDINGS: There is a shallow inspiration. Focal area of opacity at the right lung base posteriorly may represent atelectasis or pneumonia. A small right pleural effusion may be present. There is no pneumothorax. The cardiac silhouette is within normal limits. No acute osseous pathology. IMPRESSION: Focal right lung base atelectasis versus infiltrate. Clinical correlation and follow-up recommended. Electronically Signed   By: Anner Crete M.D.   On: 03/14/2016 02:39   Ct Abdomen Pelvis W Contrast  03/14/2016  CLINICAL DATA:  Left upper quadrant and periumbilical pain for 2 days. Abdominal distention. Nausea and vomiting. Elevated white cell count. Recent diagnosis of prostate cancer. EXAM: CT ABDOMEN AND PELVIS WITH CONTRAST TECHNIQUE: Multidetector CT imaging of the abdomen and pelvis was performed using the standard protocol following bolus administration of intravenous contrast. CONTRAST:  34mL ISOVUE-300 IOPAMIDOL (ISOVUE-300) INJECTION 61% COMPARISON:  None. FINDINGS: Small bilateral pleural effusions  with basilar atelectasis, greater on the left. Emphysematous changes and fibrosis in the lung bases. Coronary artery calcifications. Small esophageal hiatal hernia. Diffuse fatty infiltration of the liver. Sub cm low-attenuation lesions in the liver are too small to characterize but likely represent cysts. Recanalization of the periumbilical veins. Calcifications in the left lobe of the liver, likely dystrophic. Probable ectatic cirrhosis with enlarged lateral segment left lobe and mild nodularity to the liver contour. Spleen size is normal. Moderate amount of free fluid throughout the abdomen and pelvis, likely representing ascites. Cystic lesion in the uncinate process of the pancreas measuring 1.5 cm diameter. Suggest follow-up in 1 year with MRI. Left adrenal gland nodule measuring 1 cm diameter. This is indeterminate but statistically likely represents an adenoma. Scattered lymph nodes in the retroperitoneum are not pathologically enlarged. Diffuse calcification of abdominal aorta without aneurysm. Inferior vena cava and kidneys are unremarkable. Stomach, small bowel, and colon are not abnormally distended. No free air in the abdomen. Pelvis: Diverticula in the sigmoid colon without evidence of diverticulitis. Prostate gland is enlarged at 4.1 cm diameter. Bladder is mostly decompressed. No pelvic mass or lymphadenopathy. Appendix is normal. Degenerative changes in the spine and hips. No destructive bone lesions. IMPRESSION: Hepatic cirrhosis and fatty infiltration with recanalization of the periumbilical veins. Moderate diffuse fluid throughout the abdomen and pelvis likely due to ascites. Bilateral pleural effusions with basilar atelectasis. 1.5 cm low-attenuation lesion in the uncinate process of the pancreas. Suggest follow-up MRI in 1 year. Electronically Signed   By: Lucienne Capers M.D.   On: 03/14/2016 05:23   US Paracentesis  03/14/2016  INDICATION: Cirrhosis, ascites. Request made for diagnostic  and therapeutic paracentesis. EXAM: ULTRASOUND GUIDED DIAGNOSTIC AND THERAPEUTIC PARACENTESIS MEDICATIONS: None. COMPLICATIONS: None immediate. PROCEDURE: Informed written consent was obtained from the patient after a discussion of the risks, benefits and alternatives to treatment. A timeout was performed prior to the initiation of the procedure. Initial ultrasound scanning demonstrates a moderate amount of ascites within the left lower abdominal quadrant. The left lower abdomen was prepped and draped in the usual sterile fashion. 1% lidocaine was used for local anesthesia. Following this, a Yueh catheter was introduced. An ultrasound image was saved for documentation purposes. The paracentesis was performed. The catheter was removed and a dressing was applied. The patient tolerated the procedure well without immediate  post procedural complication. FINDINGS: A total of approximately 3 liters of clear, yellow fluid was removed. Samples were sent to the laboratory as requested by the clinical team. IMPRESSION: Successful ultrasound-guided diagnostic and therapeutic paracentesis yielding 3 liters of peritoneal fluid. Read by: Rowe Robert, PA-C Electronically Signed   By: Lucrezia Europe M.D.   On: 03/14/2016 16:58   US Abdomen Limited Ruq  03/14/2016  CLINICAL DATA:  History of cirrhosis.  Abnormal transaminases. EXAM: US ABDOMEN LIMITED - RIGHT UPPER QUADRANT COMPARISON:  Abdominal ultrasound 10/20/2015 FINDINGS: Gallbladder: The gallbladder is normally distended. No gallbladder stones or sludge are seen. There is thickening and edema of the gallbladder wall measuring up to 4.6 mm in cross-section. There is a tiny amount of pericholecystic fluid, however small amount of ascites is noted in perihepatic location as well. The sonographic Murphy's sign was reported as negative. Common bile duct: Diameter: Normal in diameter measuring 5 mm, where visualized. Liver: No focal lesion identified. The liver demonstrates nodular  contour and diffusely increased echogenicity. There is a small amount of perihepatic ascites. IMPRESSION: Normally distended gallbladder with edematous wall measuring up to 4.6 mm. In the settings of liver cirrhosis this may represent reactive inflammatory changes, however acalculous cholecystitis cannot be excluded. Notably, the sonographic Murphy's sign was negative. Cirrhotic appearance of the liver was small volume of abdominal ascites. Electronically Signed   By: Fidela Salisbury M.D.   On: 03/14/2016 17:44        Scheduled Meds: . allopurinol  100 mg Oral Daily  . feeding supplement (ENSURE ENLIVE)  237 mL Oral TID BM  . fluticasone  1 spray Each Nare Daily  . folic acid  1 mg Oral Daily  . furosemide  20 mg Intravenous Once  . levofloxacin (LEVAQUIN) IV  750 mg Intravenous Q24H  . Lifitegrast  1 drop Ophthalmic BID  . nicotine  14 mg Transdermal Daily  . sodium chloride flush  3 mL Intravenous Q12H  . sodium chloride flush  3 mL Intravenous Q12H  . thiamine  100 mg Oral Daily   Continuous Infusions:    LOS: 1 day       Mauricio Gerome Apley, MD [facility] Pager [phone]  If 7PM-7AM, please contact night-coverage [email or web address] Password [email or web address] 03/15/2016, 8:12 AM

## 2016-03-16 ENCOUNTER — Inpatient Hospital Stay (HOSPITAL_COMMUNITY): Payer: Medicare HMO

## 2016-03-16 ENCOUNTER — Encounter (HOSPITAL_COMMUNITY): Payer: Self-pay | Admitting: Gastroenterology

## 2016-03-16 DIAGNOSIS — R740 Nonspecific elevation of levels of transaminase and lactic acid dehydrogenase [LDH]: Secondary | ICD-10-CM

## 2016-03-16 DIAGNOSIS — R220 Localized swelling, mass and lump, head: Secondary | ICD-10-CM

## 2016-03-16 DIAGNOSIS — D649 Anemia, unspecified: Secondary | ICD-10-CM

## 2016-03-16 DIAGNOSIS — J9601 Acute respiratory failure with hypoxia: Secondary | ICD-10-CM

## 2016-03-16 DIAGNOSIS — J3489 Other specified disorders of nose and nasal sinuses: Secondary | ICD-10-CM | POA: Insufficient documentation

## 2016-03-16 DIAGNOSIS — R748 Abnormal levels of other serum enzymes: Secondary | ICD-10-CM | POA: Insufficient documentation

## 2016-03-16 LAB — BASIC METABOLIC PANEL
ANION GAP: 11 (ref 5–15)
BUN: 14 mg/dL (ref 4–21)
BUN: 14 mg/dL (ref 6–20)
CHLORIDE: 92 mmol/L — AB (ref 101–111)
CO2: 25 mmol/L (ref 22–32)
CREATININE: 0.63 mg/dL (ref 0.61–1.24)
Calcium: 8 mg/dL — ABNORMAL LOW (ref 8.9–10.3)
Creatinine: 0.6 mg/dL (ref 0.6–1.3)
GFR calc non Af Amer: >60 mL/min (ref >60)
GLUCOSE: 98 mg/dL
Glucose, Bld: 98 mg/dL (ref 65–99)
POTASSIUM: 3.4 mmol/L (ref 3.4–5.3)
POTASSIUM: 3.4 mmol/L — AB (ref 3.5–5.1)
SODIUM: 128 mmol/L — AB (ref 135–145)
Sodium: 128 mmol/L — AB (ref 137–147)

## 2016-03-16 LAB — CBC
HEMATOCRIT: 31.9 % — AB (ref 39.0–52.0)
HEMOGLOBIN: 11.1 g/dL — AB (ref 13.0–17.0)
MCH: 31.4 pg (ref 26.0–34.0)
MCHC: 34.8 g/dL (ref 30.0–36.0)
MCV: 90.1 fL (ref 78.0–100.0)
Platelets: 122 10*3/uL — ABNORMAL LOW (ref 150–400)
RBC: 3.54 MIL/uL — ABNORMAL LOW (ref 4.22–5.81)
RDW: 16.6 % — AB (ref 11.5–15.5)
WBC: 10.1 10*3/uL (ref 4.0–10.5)

## 2016-03-16 LAB — CBC WITH DIFFERENTIAL/PLATELET
BASOS ABS: 0 10*3/uL (ref 0.0–0.1)
BASOS PCT: 0 %
EOS ABS: 0 10*3/uL (ref 0.0–0.7)
Eosinophils Relative: 0 %
HCT: 32.2 % — ABNORMAL LOW (ref 39.0–52.0)
HEMOGLOBIN: 11 g/dL — AB (ref 13.0–17.0)
Lymphocytes Relative: 8 %
Lymphs Abs: 0.8 10*3/uL (ref 0.7–4.0)
MCH: 31.3 pg (ref 26.0–34.0)
MCHC: 34.2 g/dL (ref 30.0–36.0)
MCV: 91.7 fL (ref 78.0–100.0)
Monocytes Absolute: 0.8 10*3/uL (ref 0.1–1.0)
Monocytes Relative: 8 %
NEUTROS ABS: 9 10*3/uL — AB (ref 1.7–7.7)
NEUTROS PCT: 85 %
Platelets: 116 10*3/uL — ABNORMAL LOW (ref 150–400)
RBC: 3.51 MIL/uL — AB (ref 4.22–5.81)
RDW: 17 % — ABNORMAL HIGH (ref 11.5–15.5)
WBC: 10.6 10*3/uL — AB (ref 4.0–10.5)

## 2016-03-16 LAB — CBC AND DIFFERENTIAL
HCT: 32 % — AB (ref 41–53)
HEMOGLOBIN: 11 g/dL — AB (ref 13.5–17.5)
Platelets: 116 10*3/uL — AB (ref 150–399)
WBC: 10.6 10^3/mL

## 2016-03-16 MED ORDER — METOPROLOL TARTRATE 12.5 MG HALF TABLET
12.5000 mg | ORAL_TABLET | Freq: Two times a day (BID) | ORAL | Status: DC
Start: 2016-03-16 — End: 2016-03-19
  Administered 2016-03-16 – 2016-03-19 (×7): 12.5 mg via ORAL
  Filled 2016-03-16 (×8): qty 1

## 2016-03-16 MED ORDER — SALINE SPRAY 0.65 % NA SOLN
2.0000 | NASAL | Status: DC | PRN
  Filled 2016-03-16: qty 44

## 2016-03-16 MED ORDER — IOPAMIDOL (ISOVUE-300) INJECTION 61%
75.0000 mL | Freq: Once | INTRAVENOUS | Status: AC | PRN
  Administered 2016-03-16: 75 mL via INTRAVENOUS

## 2016-03-16 MED ORDER — LEVOFLOXACIN IN D5W 750 MG/150ML IV SOLN
750.0000 mg | INTRAVENOUS | Status: DC
  Administered 2016-03-16 – 2016-03-19 (×4): 750 mg via INTRAVENOUS
  Filled 2016-03-16 (×4): qty 150

## 2016-03-16 MED ORDER — ALBUMIN HUMAN 25 % IV SOLN
25.0000 g | Freq: Once | INTRAVENOUS | Status: AC
  Administered 2016-03-16: 25 g via INTRAVENOUS
  Filled 2016-03-16: qty 100

## 2016-03-16 MED ORDER — POLYVINYL ALCOHOL 1.4 % OP SOLN
1.0000 [drp] | Freq: Two times a day (BID) | OPHTHALMIC | Status: DC
  Administered 2016-03-16 – 2016-03-19 (×6): 1 [drp] via OPHTHALMIC
  Filled 2016-03-16 (×2): qty 15

## 2016-03-16 MED ORDER — KETOROLAC TROMETHAMINE 15 MG/ML IJ SOLN
7.5000 mg | Freq: Three times a day (TID) | INTRAMUSCULAR | Status: DC | PRN
  Administered 2016-03-16 – 2016-03-19 (×4): 7.5 mg via INTRAVENOUS
  Filled 2016-03-16 (×4): qty 1

## 2016-03-16 MED ORDER — LIDOCAINE VISCOUS 2 % MT SOLN
15.0000 mL | Freq: Once | OROMUCOSAL | Status: DC
  Filled 2016-03-16: qty 15

## 2016-03-16 MED ORDER — MUPIROCIN 2 % EX OINT
TOPICAL_OINTMENT | Freq: Two times a day (BID) | CUTANEOUS | Status: DC
Start: 2016-03-16 — End: 2016-03-19
  Administered 2016-03-16 – 2016-03-17 (×2): via NASAL
  Administered 2016-03-17: 1 via NASAL
  Administered 2016-03-18 – 2016-03-19 (×3): via NASAL
  Filled 2016-03-16: qty 22

## 2016-03-16 NOTE — Consult Note (Signed)
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  Hospitalized for ascites.  EGD yest showed a friable LEFT pyriform sinus lesion, with apparent airway impingement.  No size reported.  No images to be found.  Pt with 15 lb weight loss last month.  Dysphagia for solids but not liquids.  No aspiration.  No breathing difficulty.  No hemoptysis.  No neck masses.  No prior hx cancer.  No unusual bleeding nose, throat, rectum.  No imaging studies thus far.  On specific questioning, hx cocaine use 30+ yrs ago.    PMH: Past Medical History  Diagnosis Date  . Hypertension   . Chronic back pain   . Gout   . Chronic knee pain     left  . Fatty liver   . Abnormal LFTs   . Pancreatitis     Surg Hx: Past Surgical History  Procedure Laterality Date  . Knee surgery      left  . Esophagogastroduodenoscopy N/A 03/15/2016    Procedure: ESOPHAGOGASTRODUODENOSCOPY (EGD);  Surgeon: Doran Stabler, MD;  Location: [facility];  Service: Endoscopy;  Laterality: N/A;    FHx:   Family History  Problem Relation Age of Onset  . Diabetes Mother   . Diabetes Maternal Grandmother    SocHx:  reports that he has been smoking Cigarettes.  He has a 9 pack-year smoking history. He has never used smokeless tobacco. He reports that he drinks about 16.8 oz of alcohol per week. He reports that he does not use illicit drugs.  ALLERGIES:  Allergies  Allergen Reactions  . Penicillins Hives and Rash    Has patient had a PCN reaction causing immediate rash, facial/tongue/throat swelling, SOB or lightheadedness with hypotension:Yes Has patient had a PCN reaction causing severe rash involving mucus membranes or skin necrosis:unsure Has patient had a PCN reaction that required hospitalization:No Has patient had a PCN reaction occurring within the last 10 years:No  If all of the above answers are "NO", then may proceed  with Cephalosporin use.     Medications Prior to Admission  Medication Sig Dispense Refill  . allopurinol (ZYLOPRIM) 100 MG tablet Take 1 tablet (100 mg total) by mouth daily. 30 tablet 1  . amLODipine (NORVASC) 10 MG tablet Take 1 tablet (10 mg total) by mouth daily. 90 tablet 0  . aspirin 81 MG chewable tablet Chew 81 mg by mouth daily.    . colchicine 0.6 MG tablet Take 1 tablet (0.6 mg total) by mouth daily. (Patient taking differently: Take 0.6 mg by mouth daily as needed (for gout). ) 30 tablet 2  . cyclobenzaprine (FLEXERIL) 5 MG tablet Take 1 tablet (5 mg total) by mouth 3 (three) times daily as needed for muscle spasms. 60 tablet 1  . fluticasone (FLONASE) 50 MCG/ACT nasal spray Place 1 spray into the nose daily.    . folic acid (FOLVITE) 1 MG tablet Take 1 tablet (1 mg total) by mouth daily. 90 tablet 1  . Lifitegrast (XIIDRA) 5 % SOLN Apply 1 drop to eye 2 (two) times daily.    . meloxicam (MOBIC) 15 MG tablet Take 1 tablet (15 mg total) by mouth daily as needed for pain. 30 tablet 5  . mupirocin cream (BACTROBAN) 2 % Apply 1 application topically 2 (two) times daily. (Patient taking differently: Apply 1 application topically 2 (two) times daily as needed (for  toe fungus.). ) 15 g 0  . thiamine (VITAMIN B-1) 100 MG tablet Take 1 tablet (100 mg total) by mouth daily. 90 tablet 1  . carvedilol (COREG) 12.5 MG tablet Take 2 tablets (25 mg total) by mouth 2 (two) times daily with a meal. (Patient not taking: Reported on 03/14/2016) 60 tablet 0    Results for orders placed or performed during the hospital encounter of 03/14/16 (from the past 48 hour(s))  Albumin, pleural or peritoneal fluid     Status: None   Collection Time: 03/14/16  5:28 PM  Result Value Ref Range   Albumin, Fluid 1.6 g/dL    Comment: (NOTE) No normal range established for this test Results should be evaluated in conjunction with serum values Performed at [facility]: CORRECTED ON 07/10 AT 1742: PREVIOUSLY REPORTED AS Peritoneal  Protein, pleural or peritoneal fluid     Status: None   Collection Time: 03/14/16  5:28 PM  Result Value Ref Range   Total protein, fluid <3.0 g/dL    Comment: (NOTE) No normal range established for this test Results should be evaluated in conjunction with serum values Performed at [facility]    Fluid Type-FTP ASCITIC     Comment: CORRECTED ON 07/10 AT 1741: PREVIOUSLY REPORTED AS Peritoneal  Body fluid cell count with differential     Status: Abnormal   Collection Time: 03/14/16  5:28 PM  Result Value Ref Range   Fluid Type-FCT ASCITIC     Comment: CORRECTED ON 07/10 AT 1741: PREVIOUSLY REPORTED AS Peritoneal   Color, Fluid YELLOW YELLOW   Appearance, Fluid CLEAR CLEAR   WBC, Fluid 95 0 - 1000 cu mm   Neutrophil Count, Fluid 1 0 - 25 %   Lymphs, Fluid 4 %   Monocyte-Macrophage-Serous Fluid 95 (H) 50 - 90 %   Other Cells, Fluid CORRELATE WITH CYTOLOGY. %    Comment: OTHER CELLS IDENTIFIED AS MESOTHELIAL CELLS  Glucose, pleural or peritoneal fluid     Status: None   Collection Time: 03/14/16  5:28 PM  Result Value Ref Range   Glucose, Fluid 109 mg/dL    Comment: (NOTE) No normal range established for this test Results should be evaluated in conjunction with serum values Performed at [facility]: CORRECTED ON 07/10 AT 1740: PREVIOUSLY REPORTED AS Peritoneal  Culture, body fluid-bottle     Status: None (Preliminary result)   Collection Time: 03/14/16  5:28 PM  Result Value Ref Range   Specimen Description FLUID ASCITIC    Special Requests BOTTLES DRAWN AEROBIC AND ANAEROBIC 10CC    Culture      NO GROWTH 2 DAYS Performed at [facility]    Report Status PENDING   Gram stain     Status: None   Collection Time: 03/14/16  5:28 PM  Result Value Ref Range   Specimen Description FLUID ASCITIC    Special Requests NONE    Gram Stain      FEW  WBC PRESENT, PREDOMINANTLY MONONUCLEAR NO ORGANISMS SEEN Performed at [facility]    Report Status 03/14/2016 FINAL   Culture, sputum-assessment     Status: None   Collection Time: 03/15/16 12:58 AM  Result Value Ref Range   Specimen Description SPUTUM    Special Requests NONE    Sputum evaluation      MICROSCOPIC FINDINGS SUGGEST  THAT THIS SPECIMEN IS NOT REPRESENTATIVE OF LOWER RESPIRATORY SECRETIONS. PLEASE RECOLLECT. SPOKE TO H.PENG RN [phone] [id number] A.QUIZON    Report Status 03/15/2016 FINAL   Occult blood card to lab, stool     Status: Abnormal   Collection Time: 03/15/16  1:04 AM  Result Value Ref Range   Fecal Occult Bld POSITIVE (A) NEGATIVE  CBC     Status: Abnormal   Collection Time: 03/15/16  3:05 AM  Result Value Ref Range   WBC 10.1 4.0 - 10.5 K/uL   RBC 3.54 (L) 4.22 - 5.81 MIL/uL   Hemoglobin 11.1 (L) 13.0 - 17.0 g/dL    Comment: RESULT REPEATED AND VERIFIED DELTA CHECK NOTED POST TRANSFUSION SPECIMEN    HCT 31.9 (L) 39.0 - 52.0 %   MCV 90.1 78.0 - 100.0 fL   MCH 31.4 26.0 - 34.0 pg   MCHC 34.8 30.0 - 36.0 g/dL   RDW 16.6 (H) 11.5 - 15.5 %   Platelets 122 (L) 150 - 400 K/uL  Comprehensive metabolic panel     Status: Abnormal   Collection Time: 03/15/16  3:05 AM  Result Value Ref Range   Sodium 126 (L) 135 - 145 mmol/L   Potassium 3.4 (L) 3.5 - 5.1 mmol/L   Chloride 89 (L) 101 - 111 mmol/L   CO2 27 22 - 32 mmol/L   Glucose, Bld 98 65 - 99 mg/dL   BUN 18 6 - 20 mg/dL   Creatinine, Ser 0.68 0.61 - 1.24 mg/dL   Calcium 8.4 (L) 8.9 - 10.3 mg/dL   Total Protein 5.9 (L) 6.5 - 8.1 g/dL   Albumin 2.8 (L) 3.5 - 5.0 g/dL   AST 99 (H) 15 - 41 U/L   ALT 22 17 - 63 U/L   Alkaline Phosphatase 458 (H) 38 - 126 U/L   Total Bilirubin 5.8 (H) 0.3 - 1.2 mg/dL   GFR calc non Af Amer >60 >60 mL/min   GFR calc Af Amer >60 >60 mL/min    Comment: (NOTE) The eGFR has been calculated using the CKD EPI equation. This calculation has not been validated in all clinical  situations. eGFR's persistently <60 mL/min signify possible Chronic Kidney Disease.    Anion gap 10 5 - 15  Glucose, capillary     Status: None   Collection Time: 03/15/16 11:17 AM  Result Value Ref Range   Glucose-Capillary 73 65 - 99 mg/dL  Hemoglobin     Status: Abnormal   Collection Time: 03/15/16 11:19 AM  Result Value Ref Range   Hemoglobin 11.6 (L) 13.0 - 17.0 g/dL  CBC with Differential/Platelet     Status: Abnormal   Collection Time: 03/16/16  4:32 AM  Result Value Ref Range   WBC 10.6 (H) 4.0 - 10.5 K/uL   RBC 3.51 (L) 4.22 - 5.81 MIL/uL   Hemoglobin 11.0 (L) 13.0 - 17.0 g/dL   HCT 32.2 (L) 39.0 - 52.0 %   MCV 91.7 78.0 - 100.0 fL   MCH 31.3 26.0 - 34.0 pg   MCHC 34.2 30.0 - 36.0 g/dL   RDW 17.0 (H) 11.5 - 15.5 %   Platelets 116 (L) 150 - 400 K/uL    Comment: SPECIMEN CHECKED FOR CLOTS REPEATED TO VERIFY PLATELET COUNT CONFIRMED BY SMEAR    Neutrophils Relative % 85 %   Neutro Abs 9.0 (H) 1.7 - 7.7 K/uL   Lymphocytes Relative 8 %   Lymphs Abs 0.8 0.7 - 4.0 K/uL   Monocytes Relative 8 %  Monocytes Absolute 0.8 0.1 - 1.0 K/uL   Eosinophils Relative 0 %   Eosinophils Absolute 0.0 0.0 - 0.7 K/uL   Basophils Relative 0 %   Basophils Absolute 0.0 0.0 - 0.1 K/uL  Basic metabolic panel     Status: Abnormal   Collection Time: 03/16/16  4:32 AM  Result Value Ref Range   Sodium 128 (L) 135 - 145 mmol/L   Potassium 3.4 (L) 3.5 - 5.1 mmol/L   Chloride 92 (L) 101 - 111 mmol/L   CO2 25 22 - 32 mmol/L   Glucose, Bld 98 65 - 99 mg/dL   BUN 14 6 - 20 mg/dL   Creatinine, Ser 0.63 0.61 - 1.24 mg/dL   Calcium 8.0 (L) 8.9 - 10.3 mg/dL   GFR calc non Af Amer >60 >60 mL/min   GFR calc Af Amer >60 >60 mL/min    Comment: (NOTE) The eGFR has been calculated using the CKD EPI equation. This calculation has not been validated in all clinical situations. eGFR's persistently <60 mL/min signify possible Chronic Kidney Disease.    Anion gap 11 5 - 15   US  Paracentesis  03/16/2016  INDICATION: Cirrhosis, recurrent ascites. Request made for therapeutic paracentesis. EXAM: ULTRASOUND GUIDED THERAPEUTIC PARACENTESIS MEDICATIONS: None. COMPLICATIONS: None immediate. PROCEDURE: Informed written consent was obtained from the patient after a discussion of the risks, benefits and alternatives to treatment. A timeout was performed prior to the initiation of the procedure. Initial ultrasound scanning demonstrates a moderate amount of ascites within the left lower abdominal quadrant. The left lower abdomen was prepped and draped in the usual sterile fashion. 1% lidocaine was used for local anesthesia. Following this, a Yueh catheter was introduced. An ultrasound image was saved for documentation purposes. The paracentesis was performed. The catheter was removed and a dressing was applied. The patient tolerated the procedure well without immediate post procedural complication. FINDINGS: A total of approximately 2.6 liters of yellow fluid was removed. IMPRESSION: Successful ultrasound-guided therapeutic paracentesis yielding 2.6 liters of peritoneal fluid. Read by: Rowe Robert, PA-C Electronically Signed   By: Jerilynn Mages.  Shick M.D.   On: 03/16/2016 14:58   US Paracentesis  03/14/2016  INDICATION: Cirrhosis, ascites. Request made for diagnostic and therapeutic paracentesis. EXAM: ULTRASOUND GUIDED DIAGNOSTIC AND THERAPEUTIC PARACENTESIS MEDICATIONS: None. COMPLICATIONS: None immediate. PROCEDURE: Informed written consent was obtained from the patient after a discussion of the risks, benefits and alternatives to treatment. A timeout was performed prior to the initiation of the procedure. Initial ultrasound scanning demonstrates a moderate amount of ascites within the left lower abdominal quadrant. The left lower abdomen was prepped and draped in the usual sterile fashion. 1% lidocaine was used for local anesthesia. Following this, a Yueh catheter was introduced. An ultrasound image  was saved for documentation purposes. The paracentesis was performed. The catheter was removed and a dressing was applied. The patient tolerated the procedure well without immediate post procedural complication. FINDINGS: A total of approximately 3 liters of clear, yellow fluid was removed. Samples were sent to the laboratory as requested by the clinical team. IMPRESSION: Successful ultrasound-guided diagnostic and therapeutic paracentesis yielding 3 liters of peritoneal fluid. Read by: Rowe Robert, PA-C Electronically Signed   By: Lucrezia Europe M.D.   On: 03/14/2016 16:58   US Abdomen Limited Ruq  03/14/2016  CLINICAL DATA:  History of cirrhosis.  Abnormal transaminases. EXAM: US ABDOMEN LIMITED - RIGHT UPPER QUADRANT COMPARISON:  Abdominal ultrasound 10/20/2015 FINDINGS: Gallbladder: The gallbladder is normally distended. No gallbladder stones or  sludge are seen. There is thickening and edema of the gallbladder wall measuring up to 4.6 mm in cross-section. There is a tiny amount of pericholecystic fluid, however small amount of ascites is noted in perihepatic location as well. The sonographic Murphy's sign was reported as negative. Common bile duct: Diameter: Normal in diameter measuring 5 mm, where visualized. Liver: No focal lesion identified. The liver demonstrates nodular contour and diffusely increased echogenicity. There is a small amount of perihepatic ascites. IMPRESSION: Normally distended gallbladder with edematous wall measuring up to 4.6 mm. In the settings of liver cirrhosis this may represent reactive inflammatory changes, however acalculous cholecystitis cannot be excluded. Notably, the sonographic Murphy's sign was negative. Cirrhotic appearance of the liver was small volume of abdominal ascites. Electronically Signed   By: Fidela Salisbury M.D.   On: 03/14/2016 17:44    Blood pressure 108/78, pulse 117, temperature 99.1 F (37.3 C), temperature source Oral, resp. rate 19, height 5' 5.5"  (1.664 m), weight 52.1 kg (114 lb 13.8 oz), SpO2 97 %.  PHYSICAL EXAM: Overall appearance:  Very thin/cachectic appearing.  Mental status appropriate.  Voice clear. Resp. Unlabored through the nose.   Head:  NCAT Ears:clear Nose: heavy anterior crusting with 12 mm healed anterior septal perforation. Oral Cavity:teeth in fair repair. Oral Pharynx:  clear Neuro: grossly intact Neck: thin.  No masses or nodes  Using the flexible laryngoscope, NP is clear.  OP clear.  HP with pooling of loose clear/frothy secretions.  Sl juvenile epiglottis.  VC's mobile.  Airway good.  Possible pooling LEFT pyriform sinus, but no lesions identified.    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  Malnutrition, more likely liver disease than dysphagia.    I ordered a CT neck with contrast.  I will discuss results with the patient and family.  Absent any visible lesions except third hand report from EGD, will likely need panendoscopy with biopsy under anesthesia, hopefully during this hospitalization.    I discussed all this with pt and family.    [name] 1/70/0174, 4:21 PM

## 2016-03-16 NOTE — Progress Notes (Signed)
PROGRESS NOTE  Adam Marshall [id number] DOB: 09-10-1960 DOA: 03/14/2016 PCP: [name], FNP  HPI/Recap of past 24 hours:  laying in bed, NAD, denies pain,. No confusion, no agitation  Assessment/Plan: Principal Problem:   Cirrhosis of liver with ascites (HCC) Active Problems:   HYPERTENSION, BENIGN ESSENTIAL   Alcohol use (HCC)   Liver failure (HCC)   Anemia   Respiratory failure with hypoxia (HCC)   Lesion of pancreas   Generalized abdominal pain   Hyponatremia   Melena   Ascites   1-Acute hypoxic Respiratory Failure:  This could be multifactorial, might have underline emphysema,  anemia, ?PNA, Abdominal distension.  He was admitted to step down initially He received one dose of levaquin on 7/10 2 units PRBC.  Paracentesis.  Improved, now on room air , continue abx, add incentive spirometer  2-Cirrhosis Liver/ascites; In setting of alcohol, fatty liver.  paracentesis for ascites.  Albumin, No ab pain GI input appreciated,   3-Hyponatremia; could be secondary to cirrhosis and or dehydration.  Would Avoid IV fluids due to significant ascites. Might be able to give IV fluids after paracentesis.  Clinically dry, though does has ascites  4-Anemia:  hgb 7 on admission, Last hb per records 5 months ago was at 11.  B12/folate/iron panel unremarkable/tsh wnl S/p prbc transfusionx2 units on 7/10 EGd with LA grad D reflux esophagitis, and nonbleeding gastric ulcer,  ppi bid for 8 weeks per GI recommendation    5-PNA; He report productive cough, chest x ray with possible infiltrates. Leukocytosis.  continue Levaquin.   6-Alcohol use: High risk for withdrawal.  CIWA protocol ordered.   7-Hypokalemia;  Replace orally.   8. Left pyriform sinus mass:  Concerning for malignancy, with h/o alcohol/smoking and significant weight loss ENT consult, Dr [name] from [facility] paged, [phone].  9. Sinus tachycardia, start low dose lopressor with holding  parameter  10 FTT, weakness, Severe malnutrition in context of chronic illness  Body mass index is 18.82 kg/(m^2).  HIV negative, Nutrition consulted, Physical therapy   DVT prophylaxis: SCD Code Status: Full code.  Family Communication: care discussed with patient  Disposition Plan: pending clinical improvement  Consults called: GI   Procedures: Paracentesis egd prbc  transfusion  Antibiotics:  none   Objective: BP 133/95 mmHg  Pulse 122  Temp(Src) 99.8 F (37.7 C) (Oral)  Resp 19  Ht 5' 5.5" (1.664 m)  Wt 52.1 kg (114 lb 13.8 oz)  BMI 18.82 kg/m2  SpO2 95%  Intake/Output Summary (Last 24 hours) at 03/16/16 0746 Last data filed at 03/16/16 0542  Gross per 24 hour  Intake    880 ml  Output    575 ml  Net    305 ml   Filed Weights   03/14/16 0741 03/14/16 0856 03/15/16 0000  Weight: 49.896 kg (110 lb) 54.3 kg (119 lb 11.4 oz) 52.1 kg (114 lb 13.8 oz)    Exam:   General:  Frail, significant underweight, temporal wasting , NAD  Cardiovascular: sinus tachycardia  Respiratory: CTABL  Abdomen: distended, non tender, , positive BS  Musculoskeletal: No Edema  Neuro: aaox3  Data Reviewed: Basic Metabolic Panel:  Recent Labs Lab 03/14/16 0222 03/14/16 0736 03/15/16 0305 03/16/16 0432  NA 125*  --  126* 128*  K 3.4*  --  3.4* 3.4*  CL 85*  --  89* 92*  CO2 24  --  27 25  GLUCOSE 116*  --  98 98  BUN 12  --  18 14  CREATININE 0.62  --  0.68 0.63  CALCIUM 8.5*  --  8.4* 8.0*  MG  --  1.4*  --   --    Liver Function Tests:  Recent Labs Lab 03/14/16 0222 03/15/16 0305  AST 138* 99*  ALT 28 22  ALKPHOS 655* 458*  BILITOT 2.4* 5.8*  PROT 6.6 5.9*  ALBUMIN 3.0* 2.8*    Recent Labs Lab 03/14/16 0222  LIPASE 47   No results for input(s): AMMONIA in the last 168 hours. CBC:  Recent Labs Lab 03/14/16 0222 03/15/16 0305 03/15/16 1119 03/16/16 0432  WBC 13.9* 10.1  --  10.6*  NEUTROABS 11.6*  --   --  9.0*  HGB 7.1* 11.1* 11.6*  11.0*  HCT 21.1* 31.9*  --  32.2*  MCV 95.0 90.1  --  91.7  PLT 190 122*  --  116*   Cardiac Enzymes:   No results for input(s): CKTOTAL, CKMB, CKMBINDEX, TROPONINI in the last 168 hours. BNP (last 3 results) No results for input(s): BNP in the last 8760 hours.  ProBNP (last 3 results) No results for input(s): PROBNP in the last 8760 hours.  CBG:  Recent Labs Lab 03/15/16 1117  GLUCAP 73    Recent Results (from the past 240 hour(s))  MRSA PCR Screening     Status: None   Collection Time: 03/14/16  9:00 AM  Result Value Ref Range Status   MRSA by PCR NEGATIVE NEGATIVE Final    Comment:        The GeneXpert MRSA Assay (FDA approved for NASAL specimens only), is one component of a comprehensive MRSA colonization surveillance program. It is not intended to diagnose MRSA infection nor to guide or monitor treatment for MRSA infections.   Culture, blood (routine x 2) Call MD if unable to obtain prior to antibiotics being given     Status: None (Preliminary result)   Collection Time: 03/14/16  9:13 AM  Result Value Ref Range Status   Specimen Description BLOOD BLOOD RIGHT HAND  Final   Special Requests IN PEDIATRIC BOTTLE 2CC  Final   Culture   Final    NO GROWTH 1 DAY Performed at [facility]    Report Status PENDING  Incomplete  Culture, blood (routine x 2) Call MD if unable to obtain prior to antibiotics being given     Status: None (Preliminary result)   Collection Time: 03/14/16  9:13 AM  Result Value Ref Range Status   Specimen Description BLOOD RIGHT ARM  Final   Special Requests BOTTLES DRAWN AEROBIC AND ANAEROBIC 5CC  Final   Culture   Final    NO GROWTH 1 DAY Performed at [facility]    Report Status PENDING  Incomplete  Culture, body fluid-bottle     Status: None (Preliminary result)   Collection Time: 03/14/16  5:28 PM  Result Value Ref Range Status   Specimen Description FLUID ASCITIC  Final   Special Requests BOTTLES DRAWN AEROBIC AND  ANAEROBIC 10CC  Final   Culture   Final    NO GROWTH < 24 HOURS Performed at [facility]    Report Status PENDING  Incomplete  Gram stain     Status: None   Collection Time: 03/14/16  5:28 PM  Result Value Ref Range Status   Specimen Description FLUID ASCITIC  Final   Special Requests NONE  Final   Gram Stain   Final    FEW WBC PRESENT, PREDOMINANTLY MONONUCLEAR  NO ORGANISMS SEEN Performed at [facility]    Report Status 03/14/2016 FINAL  Final  Culture, sputum-assessment     Status: None   Collection Time: 03/15/16 12:58 AM  Result Value Ref Range Status   Specimen Description SPUTUM  Final   Special Requests NONE  Final   Sputum evaluation   Final    MICROSCOPIC FINDINGS SUGGEST THAT THIS SPECIMEN IS NOT REPRESENTATIVE OF LOWER RESPIRATORY SECRETIONS. PLEASE RECOLLECT. SPOKE TO H.PENG RN [phone] [id number] A.QUIZON    Report Status 03/15/2016 FINAL  Final     Studies: No results found.  Scheduled Meds: . allopurinol  100 mg Oral Daily  . feeding supplement (ENSURE ENLIVE)  237 mL Oral TID BM  . fluticasone  1 spray Each Nare Daily  . folic acid  1 mg Oral Daily  . furosemide  20 mg Intravenous Once  . Lifitegrast  1 drop Ophthalmic BID  . nicotine  14 mg Transdermal Daily  . pantoprazole  40 mg Oral BID AC  . potassium chloride  10 mEq Intravenous Once  . sodium chloride flush  3 mL Intravenous Q12H  . sodium chloride flush  3 mL Intravenous Q12H  . thiamine  100 mg Oral Daily    Continuous Infusions:    Time spent: 36mins  Gwyndolyn Guilford MD, PhD  [facility] Pager [phone]. If 7PM-7AM, please contact night-coverage at [email or web address], password [facility] 03/16/2016, 7:46 AM  LOS: 2 days

## 2016-03-16 NOTE — Procedures (Signed)
Ultrasound-guided therapeutic paracentesis performed yielding 2.6 liters of yellow fluid. No immediate complications.

## 2016-03-16 NOTE — Progress Notes (Signed)
Progress Note   Subjective  Adam Marshall is a 55 year old African-American male who was admitted on 03/14/16 for increasing abdominal distention.  This morning, the patient is found lying in bed. He tells me that he has been feeling quite well. He believes to be able to get up and go to the bathroom. His abdomen continues to be decreased distention per his report. He denies any new complaints or concerns.  Objective   Vital signs in last 24 hours: Temp:  [99.1 F (37.3 C)-100 F (37.8 C)] 99.8 F (37.7 C) (07/12 0500) Pulse Rate:  [107-122] 122 (07/12 0500) Resp:  [17-23] 19 (07/12 0500) BP: (97-133)/(75-102) 133/95 mmHg (07/12 0500) SpO2:  [91 %-99 %] 95 % (07/12 0500) Last BM Date: 03/16/16 General: African American male in NAD Heart: Regular rate and rhythm; no murmurs Lungs: Respirations even and unlabored, lungs CTA bilaterally Abdomen: Soft, nontender and mild distension Normal bowel sounds. Extremities: Without edema. Neurologic: Alert and oriented, grossly normal neurologically. Psych: Cooperative. Normal mood and affect.  Intake/Output from previous day: 07/11 0701 - 07/12 0700 In: 880 [P.O.:330; I.V.:550] Out: 575 [Urine:575]  Lab Results:  Recent Labs  03/14/16 0222 03/15/16 0305 03/15/16 1119 03/16/16 0432  WBC 13.9* 10.1  --  10.6*  HGB 7.1* 11.1* 11.6* 11.0*  HCT 21.1* 31.9*  --  32.2*  PLT 190 122*  --  116*   BMET  Recent Labs  03/14/16 0222 03/15/16 0305 03/16/16 0432  NA 125* 126* 128*  K 3.4* 3.4* 3.4*  CL 85* 89* 92*  CO2 24 27 25   GLUCOSE 116* 98 98  BUN 12 18 14   CREATININE 0.62 0.68 0.63  CALCIUM 8.5* 8.4* 8.0*   LFT  Recent Labs  03/15/16 0305  PROT 5.9*  ALBUMIN 2.8*  AST 99*  ALT 22  ALKPHOS 458*  BILITOT 5.8*   PT/INR  Recent Labs  03/14/16 0222  LABPROT 15.0  INR 1.16    Studies/Results: US Paracentesis  03/14/2016  INDICATION: Cirrhosis, ascites. Request made for diagnostic and therapeutic  paracentesis. EXAM: ULTRASOUND GUIDED DIAGNOSTIC AND THERAPEUTIC PARACENTESIS MEDICATIONS: None. COMPLICATIONS: None immediate. PROCEDURE: Informed written consent was obtained from the patient after a discussion of the risks, benefits and alternatives to treatment. A timeout was performed prior to the initiation of the procedure. Initial ultrasound scanning demonstrates a moderate amount of ascites within the left lower abdominal quadrant. The left lower abdomen was prepped and draped in the usual sterile fashion. 1% lidocaine was used for local anesthesia. Following this, a Yueh catheter was introduced. An ultrasound image was saved for documentation purposes. The paracentesis was performed. The catheter was removed and a dressing was applied. The patient tolerated the procedure well without immediate post procedural complication. FINDINGS: A total of approximately 3 liters of clear, yellow fluid was removed. Samples were sent to the laboratory as requested by the clinical team. IMPRESSION: Successful ultrasound-guided diagnostic and therapeutic paracentesis yielding 3 liters of peritoneal fluid. Read by: Rowe Robert, PA-C Electronically Signed   By: Lucrezia Europe M.D.   On: 03/14/2016 16:58   US Abdomen Limited Ruq  03/14/2016  CLINICAL DATA:  History of cirrhosis.  Abnormal transaminases. EXAM: US ABDOMEN LIMITED - RIGHT UPPER QUADRANT COMPARISON:  Abdominal ultrasound 10/20/2015 FINDINGS: Gallbladder: The gallbladder is normally distended. No gallbladder stones or sludge are seen. There is thickening and edema of the gallbladder wall measuring up to 4.6 mm in cross-section. There is a tiny amount of pericholecystic fluid, however small amount  of ascites is noted in perihepatic location as well. The sonographic Murphy's sign was reported as negative. Common bile duct: Diameter: Normal in diameter measuring 5 mm, where visualized. Liver: No focal lesion identified. The liver demonstrates nodular contour and  diffusely increased echogenicity. There is a small amount of perihepatic ascites. IMPRESSION: Normally distended gallbladder with edematous wall measuring up to 4.6 mm. In the settings of liver cirrhosis this may represent reactive inflammatory changes, however acalculous cholecystitis cannot be excluded. Notably, the sonographic Murphy's sign was negative. Cirrhotic appearance of the liver was small volume of abdominal ascites. Electronically Signed   By: Fidela Salisbury M.D.   On: 03/14/2016 17:44   03/15/16-EGD, Dr. [name]: LA grade D reflux esophagitis, nonbleeding gastric ulcer with no stigmata of bleeding, normal examined duodenum, exophytic and friable mass on the left piriform sinus, partially obstructing the airway, small hiatal hernia    Assessment / Plan:   Impression: 1. Decompensated Etoh Cirrhosis: At time of admission, tense ascites, sodium decreased at 125, alkaline phosphatase elevated at 655 , AST at 138 with normal ALT at 28, total bili 2. LFT's continue to improve, ascites still present, plan for repeat paracentesis today 2. Hyponatremia: See above; secondary to cirrhosis 3. Anemia: hgb remaining stable at this time, EGD yesterday with non bleeding ulcer and esophagitis 4. Acute hypoxic respiratory failure: ascites likely contributing, though with abnormal chest x-ray, is likely multifactorial 5. Alcohol use 6. Abnormal CT Abd/Pelvis: Cirrhosis-discussed above; also low attenuating lesion in uncinate process of pancreas 7. 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 Repeat therapeutic paracentesis today with removal of up to 5 L of fluid 2. Ordered 25 g of 25% IV albumin to be given right before or directly after paracentesis 3. Agree with CMP for tomorrow, based on electrolytes will consider addition of diuretics at that time 4. Recommend the patient have removal of his condom catheter and be allowed to ambulate  with the assistance of physical therapy today 5. Again recommend consultation by ENT for mass discovered at time of EGD yesterday 6. Continue Protonix 40mg  BID 7. Low sodium diet, <2g per day and 2 L daily volume restriction 8. Discussed above with Dr. [name]  Thank you for your kind consultation, we will continue to follow.  Principal Problem:   Cirrhosis of liver with ascites ([place]) Active Problems:   HYPERTENSION, BENIGN ESSENTIAL   Alcohol use ([place])   Liver failure (H[facility])   Anemia   Respiratory failure with hypoxia (H[facility])   Lesion of pancreas   Generalized abdominal pain   Hyponatremia   Melena   Ascites     LOS: 2 days   Levin Erp  03/16/2016, 10:24 AM  Pager # [phone]

## 2016-03-17 DIAGNOSIS — K869 Disease of pancreas, unspecified: Secondary | ICD-10-CM | POA: Insufficient documentation

## 2016-03-17 DIAGNOSIS — J189 Pneumonia, unspecified organism: Secondary | ICD-10-CM

## 2016-03-17 DIAGNOSIS — K2100 Gastro-esophageal reflux disease with esophagitis, without bleeding: Secondary | ICD-10-CM | POA: Insufficient documentation

## 2016-03-17 DIAGNOSIS — R17 Unspecified jaundice: Secondary | ICD-10-CM | POA: Insufficient documentation

## 2016-03-17 DIAGNOSIS — E871 Hypo-osmolality and hyponatremia: Secondary | ICD-10-CM

## 2016-03-17 LAB — CBC
HCT: 31.4 % — ABNORMAL LOW (ref 39.0–52.0)
Hemoglobin: 10.5 g/dL — ABNORMAL LOW (ref 13.0–17.0)
MCH: 31.1 pg (ref 26.0–34.0)
MCHC: 33.4 g/dL (ref 30.0–36.0)
MCV: 92.9 fL (ref 78.0–100.0)
PLATELETS: 113 10*3/uL — AB (ref 150–400)
RBC: 3.38 MIL/uL — AB (ref 4.22–5.81)
RDW: 16.7 % — AB (ref 11.5–15.5)
WBC: 10.3 10*3/uL (ref 4.0–10.5)

## 2016-03-17 LAB — BASIC METABOLIC PANEL
BUN: 13 mg/dL (ref 4–21)
CREATININE: 0.7 mg/dL (ref 0.6–1.3)
GLUCOSE: 96 mg/dL
Potassium: 3.3 mmol/L — AB (ref 3.4–5.3)
Sodium: 128 mmol/L — AB (ref 137–147)

## 2016-03-17 LAB — CBC AND DIFFERENTIAL
HCT: 31 % — AB (ref 41–53)
Hemoglobin: 10.5 g/dL — AB (ref 13.5–17.5)
Platelets: 113 10*3/uL — AB (ref 150–399)
WBC: 10.3 10^3/mL

## 2016-03-17 LAB — COMPREHENSIVE METABOLIC PANEL
ALT: 30 U/L (ref 17–63)
AST: 145 U/L — AB (ref 15–41)
Albumin: 2.7 g/dL — ABNORMAL LOW (ref 3.5–5.0)
Alkaline Phosphatase: 431 U/L — ABNORMAL HIGH (ref 38–126)
Anion gap: 11 (ref 5–15)
BUN: 13 mg/dL (ref 6–20)
CHLORIDE: 92 mmol/L — AB (ref 101–111)
CO2: 25 mmol/L (ref 22–32)
CREATININE: 0.69 mg/dL (ref 0.61–1.24)
Calcium: 8 mg/dL — ABNORMAL LOW (ref 8.9–10.3)
GFR calc non Af Amer: >60 mL/min (ref >60)
Glucose, Bld: 96 mg/dL (ref 65–99)
POTASSIUM: 3.3 mmol/L — AB (ref 3.5–5.1)
SODIUM: 128 mmol/L — AB (ref 135–145)
Total Bilirubin: 5 mg/dL — ABNORMAL HIGH (ref 0.3–1.2)
Total Protein: 5.6 g/dL — ABNORMAL LOW (ref 6.5–8.1)

## 2016-03-17 LAB — HEPATIC FUNCTION PANEL
ALT: 30 U/L (ref 10–40)
Alkaline Phosphatase: 431 U/L — AB (ref 25–125)
Bilirubin, Total: 5 mg/dL

## 2016-03-17 LAB — MAGNESIUM: MAGNESIUM: 1.1 mg/dL — AB (ref 1.7–2.4)

## 2016-03-17 MED ORDER — POTASSIUM CHLORIDE CRYS ER 20 MEQ PO TBCR
40.0000 meq | EXTENDED_RELEASE_TABLET | Freq: Once | ORAL | Status: AC
  Administered 2016-03-17: 40 meq via ORAL
  Filled 2016-03-17: qty 2

## 2016-03-17 MED ORDER — MAGNESIUM SULFATE 4 GM/100ML IV SOLN
4.0000 g | Freq: Once | INTRAVENOUS | Status: AC
  Administered 2016-03-17: 4 g via INTRAVENOUS
  Filled 2016-03-17: qty 100

## 2016-03-17 MED ORDER — SPIRONOLACTONE 100 MG PO TABS
100.0000 mg | ORAL_TABLET | Freq: Once | ORAL | Status: AC
  Administered 2016-03-17: 100 mg via ORAL
  Filled 2016-03-17 (×2): qty 1

## 2016-03-17 MED ORDER — FUROSEMIDE 40 MG PO TABS
40.0000 mg | ORAL_TABLET | Freq: Every day | ORAL | Status: DC
  Administered 2016-03-17 – 2016-03-19 (×3): 40 mg via ORAL
  Filled 2016-03-17 (×5): qty 1

## 2016-03-17 NOTE — NC FL2 (Signed)
[place] LEVEL OF CARE SCREENING TOOL     IDENTIFICATION  Patient Name: Adam Marshall Birthdate: 1961/07/08 Sex: male Admission Date (Current Location): 03/14/2016  [facility] and [place] Number:  Herbalist and Address:  [facility],  [place] [address] [place]      Provider Number: [id number]  Attending Physician Name and Address:  Florencia Reasons, MD  Relative Name and Phone Number:       Current Level of Care: Hospital Recommended Level of Care: [place] Prior Approval Number:    Date Approved/Denied:   PASRR Number: [id number]  Discharge Plan: SNF    Current Diagnoses: Patient Active Problem List   Diagnosis Date Noted  . Abnormal transaminases   . Normochromic normocytic anemia   . Pyriform sinus mass   . Melena   . Ascites   . Cirrhosis of liver with ascites ([place]) 03/14/2016  . Liver failure ([place]) 03/14/2016  . Anemia 03/14/2016  . Respiratory failure with hypoxia ([place]) 03/14/2016  . Lesion of pancreas   . Generalized abdominal pain   . Hyponatremia   . Abscess of skin of abdomen 01/13/2016  . Leg cramping 01/13/2016  . Abnormal LFTs (liver function tests) 10/13/2015  . Alcohol use ([place]) 10/13/2015  . Left knee pain 02/04/2015  . Encounter for general adult medical examination with abnormal findings 12/25/2014  . Medicare annual wellness visit, subsequent 12/25/2014  . Knee pain 12/08/2014  . Chronic pancreatitis ([place]) 05/03/2011  . Nausea and vomiting 04/24/2011  . Rhinorrhea 04/24/2011  . Gastroenteritis 10/20/2010  . PREMATURE VENTRICULAR CONTRACTIONS 07/07/2010  . HYPERKALEMIA 06/29/2010  . ACUTE GOUTY ARTHROPATHY 06/28/2010  . HYPERTENSION, BENIGN ESSENTIAL 06/28/2010    Orientation RESPIRATION BLADDER Height & Weight     Self, Time, Situation, Place  Normal Continent Weight: 52.1 kg (114 lb 13.8 oz) Height:  5' 5.5" (166.4 cm)  BEHAVIORAL SYMPTOMS/MOOD NEUROLOGICAL BOWEL NUTRITION  STATUS  Other (Comment) (No behaviors)   Continent Diet  AMBULATORY STATUS COMMUNICATION OF NEEDS Skin   Extensive Assist Verbally Normal                       Personal Care Assistance Level of Assistance  Bathing, Feeding, Dressing Bathing Assistance: Limited assistance Feeding assistance: Independent Dressing Assistance: Limited assistance     Functional Limitations Info  Sight, Hearing, Speech Sight Info: Adequate Hearing Info: Adequate Speech Info: Adequate    SPECIAL CARE FACTORS FREQUENCY  PT (By licensed PT), OT (By licensed OT)     PT Frequency: 5 x wk OT Frequency: 5 x wk            Contractures Contractures Info: Not present    Additional Factors Info  Code Status, Allergies, Psychotropic Code Status Info: Full Code Allergies Info: Penicllins Psychotropic Info: ativan         Current Medications (03/17/2016):  This is the current hospital active medication list Current Facility-Administered Medications  Medication Dose Route Frequency Provider Last Rate Last Dose  . 0.9 %  sodium chloride infusion  250 mL Intravenous PRN Belkys A Regalado, MD 10 mL/hr at 03/14/16 1120 500 mL at 03/14/16 1120  . 0.9 %  sodium chloride infusion  250 mL Intravenous PRN Belkys A Regalado, MD 10 mL/hr at 03/14/16 1120 1,000 mL at 03/14/16 1120  . albuterol (PROVENTIL) (2.5 MG/3ML) 0.083% nebulizer solution 2.5 mg  2.5 mg Nebulization Q6H PRN Elmarie Shiley, MD      .  allopurinol (ZYLOPRIM) tablet 100 mg  100 mg Oral Daily [name], MD   123XX123 mg at 03/17/16 1031  . colchicine tablet 0.6 mg  0.6 mg Oral Daily PRN Belkys A Regalado, MD      . feeding supplement (ENSURE ENLIVE) (ENSURE ENLIVE) liquid 237 mL  237 mL Oral TID BM Belkys A Regalado, MD   237 mL at 03/17/16 1032  . fluticasone (FLONASE) 50 MCG/ACT nasal spray 1 spray  1 spray Each Nare Daily [name], MD   1 spray at 03/16/16 1022  . folic acid (FOLVITE) tablet 1 mg  1 mg Oral Daily Belkys A Regalado, MD   1  mg at 03/17/16 1031  . furosemide (LASIX) injection 20 mg  20 mg Intravenous Once Belkys A Regalado, MD   20 mg at 03/14/16 0730  . furosemide (LASIX) tablet 40 mg  40 mg Oral Daily Lavone Nian [place], [place]      . ketorolac (TORADOL) 15 MG/ML injection 7.5 mg  7.5 mg Intravenous Q8H PRN Florencia Reasons, MD   7.5 mg at 03/16/16 1844  . levofloxacin (LEVAQUIN) IVPB 750 mg  750 mg Intravenous Q24H Florencia Reasons, MD   750 mg at 03/17/16 1211  . lidocaine (XYLOCAINE) 2 % viscous mouth solution 15 mL  15 mL Mouth/Throat Once [name], MD      . LORazepam (ATIVAN) tablet 1 mg  1 mg Oral Q4H PRN Tawni Millers, MD   1 mg at 03/15/16 2135  . metoprolol tartrate (LOPRESSOR) tablet 12.5 mg  12.5 mg Oral BID Florencia Reasons, MD   12.5 mg at 03/17/16 1030  . mupirocin ointment (BACTROBAN) 2 %   Nasal BID [name], MD   1 application at [id number]  . nicotine (NICODERM CQ - dosed in mg/24 hours) patch 14 mg  14 mg Transdermal Daily Belkys A Regalado, MD   14 mg at 03/17/16 1029  . ondansetron (ZOFRAN) tablet 4 mg  4 mg Oral Q6H PRN Belkys A Regalado, MD       Or  . ondansetron (ZOFRAN) injection 4 mg  4 mg Intravenous Q6H PRN Belkys A Regalado, MD   4 mg at 03/14/16 1123  . pantoprazole (PROTONIX) EC tablet 40 mg  40 mg Oral BID AC [name], MD   40 mg at 03/17/16 0803  . polyvinyl alcohol (LIQUIFILM TEARS) 1.4 % ophthalmic solution 1 drop  1 drop Both Eyes BID Florencia Reasons, MD   1 drop at 03/17/16 0803  . potassium chloride 10 mEq in 100 mL IVPB  10 mEq Intravenous Once [employer], [place]      . sodium chloride (OCEAN) 0.65 % nasal spray 2 spray  2 spray Each Nare PRN [name], MD      . sodium chloride flush (NS) 0.9 % injection 3 mL  3 mL Intravenous Q12H Belkys A Regalado, MD   3 mL at 03/17/16 1000  . sodium chloride flush (NS) 0.9 % injection 3 mL  3 mL Intravenous PRN Belkys A Regalado, MD      . sodium chloride flush (NS) 0.9 % injection 3 mL  3 mL Intravenous Q12H Belkys A Regalado, MD    3 mL at 03/17/16 1000  . sodium chloride flush (NS) 0.9 % injection 3 mL  3 mL Intravenous PRN Belkys A Regalado, MD      . spironolactone (ALDACTONE) tablet 100 mg  100 mg Oral Once Levin Erp, [place]      .  thiamine (VITAMIN B-1) tablet 100 mg  100 mg Oral Daily Belkys A Regalado, MD   100 mg at 03/17/16 1030     Discharge Medications: Please see discharge summary for a list of discharge medications.  Relevant Imaging Results:  Relevant Lab Results:   Additional Information ss # [id number]  Ha[place]er, Randall An, LCSW

## 2016-03-17 NOTE — Care Management Important Message (Signed)
Important Message  Patient Details  Name: Adam Marshall MRN: [id number] Date of Birth: 14-Oct-1960   Medicare Important Message Given:  Yes    Camillo Flaming 03/17/2016, 9:50 AMImportant Message  Patient Details  Name: Adam Marshall MRN: [id number] Date of Birth: [birth year]   Medicare Important Message Given:  Yes    Camillo Flaming 03/17/2016, 9:50 AM

## 2016-03-17 NOTE — Clinical Social Work Note (Signed)
Clinical Social Work Assessment  Patient Details  Name: Adam Marshall MRN: [id number] Date of Birth: Dec 11, 1960  Date of referral:  03/17/16               Reason for consult:  Discharge Planning                Permission sought to share information with:  Chartered certified accountant granted to share information::  Yes, Verbal Permission Granted  Name::        Agency::     Relationship::     Contact Information:     Housing/Transportation Living arrangements for the past 2 months:  Apartment Source of Information:  Patient, Adult Children Patient Interpreter Needed:  None Criminal Activity/Legal Involvement Pertinent to Current Situation/Hospitalization:  No - Comment as needed Significant Relationships:  Adult Children, Siblings, Significant Other Lives with:  Significant Other Do you feel safe going back to the place where you live?  Yes Need for family participation in patient care:  No (Coment)  Care giving concerns:  Family feels ST Rehab is needed at d/c. They feel pt doesn't have enough support at home. Per nsg, significant other wants pt to return home at d/c. Pt is undecided at this time.  Social Worker assessment / plan:  Pt hospitalized on 03/14/16 with cirrhosis of the liver with ascites. CSW consulted to assist with d/c planning. PN reviewed. Pt has significant hx of ETOH abuse. PT has  recommended SNF at d/c. CSW met with pt / son, Adam Marshall [phone], at bedside. PT recommendations reviewed. Son supports plan for SNF. Pt will consider this plan and gave CSW permission to initiate SNF search. CSW will review SNF bed offers with pt / family when available. Pt has [employer] which requires prior authorization. CSW will assist with authorization process if appropriate. CSW will continue to follow to assist with d/c planning.  Employment status:  Disabled (Comment on whether or not currently receiving Disability) Insurance information:  Managed  Medicare PT Recommendations:  [place] / Referral to community resources:  [place]  Patient/Family's Response to care:  Disposition to be determined. Patient/Family's Understanding of and Emotional Response to Diagnosis, Current Treatment, and Prognosis: MD met with pt / family to provide medical update.  Pt reports that he is feeling a little better than yesterday. Pt reports that he would prefer to go home at d/c but will consider ST placement. ETOH abuse not addressed at this time.  Emotional Assessment Appearance:  Appears stated age Attitude/Demeanor/Rapport:  Other (cooperative) Affect (typically observed):  Irritable Orientation:  Oriented to Self, Oriented to Place, Oriented to  Time, Oriented to Situation Alcohol / Substance use:  Alcohol Use Psych involvement (Current and /or in the community):  No (Comment)  Discharge Needs  Concerns to be addressed:  Discharge Planning Concerns Readmission within the last 30 days:  No Current discharge risk:  Substance Abuse Barriers to Discharge:  No Barriers Identified   Luretha Rued, [place] 03/17/2016, 1:50 PM

## 2016-03-17 NOTE — Clinical Social Work Placement (Signed)
   CLINICAL SOCIAL WORK PLACEMENT  NOTE  Date:  03/17/2016  Patient Details  Name: Adam Marshall MRN: [id number] Date of Birth: April 24, 1961  Clinical Social Work is seeking post-discharge placement for this patient at the [place] level of care (*CSW will initial, date and re-position this form in  chart as items are completed):  Yes   Patient/family provided with [place] Work Department's list of facilities offering this level of care within the geographic area requested by the patient (or if unable, by the patient's family).  Yes   Patient/family informed of their freedom to choose among providers that offer the needed level of care, that participate in Medicare, Medicaid or managed care program needed by the patient, have an available bed and are willing to accept the patient.  Yes   Patient/family informed of [facility]'s ownership interest in [facility] and [facility], as well as of the fact that they are under no obligation to receive care at these facilities.  PASRR submitted to EDS on 03/17/16     PASRR number received on 03/17/16     Existing PASRR number confirmed on       FL2 transmitted to all facilities in geographic area requested by pt/family on 03/17/16     FL2 transmitted to all facilities within larger geographic area on       Patient informed that his/her managed care company has contracts with or will negotiate with certain facilities, including the following:            Patient/family informed of bed offers received.  Patient chooses bed at       Physician recommends and patient chooses bed at      Patient to be transferred to   on  .  Patient to be transferred to facility by       Patient family notified on   of transfer.  Name of family member notified:        PHYSICIAN       Additional Comment:    _______________________________________________ Luretha Rued, [place] 03/17/2016,  2:24 PM

## 2016-03-17 NOTE — Progress Notes (Signed)
03/17/2016 12:51 PM  Adam Marshall [id number]      Temp:  [98.8 F (37.1 C)-99.6 F (37.6 C)] 98.8 F (37.1 C) (07/13 0540) Pulse Rate:  [105-119] 105 (07/13 0540) Resp:  [16] 16 (07/13 0540) BP: (108-122)/(75-87) 116/81 mmHg (07/13 0540) SpO2:  [90 %-98 %] 98 % (07/13 0540),     Intake/Output Summary (Last 24 hours) at 03/17/16 1251 Last data filed at 03/17/16 1103  Gross per 24 hour  Intake    243 ml  Output    850 ml  Net   -607 ml    Results for orders placed or performed during the hospital encounter of 03/14/16 (from the past 24 hour(s))  CBC     Status: Abnormal   Collection Time: 03/17/16  4:24 AM  Result Value Ref Range   WBC 10.3 4.0 - 10.5 K/uL   RBC 3.38 (L) 4.22 - 5.81 MIL/uL   Hemoglobin 10.5 (L) 13.0 - 17.0 g/dL   HCT 31.4 (L) 39.0 - 52.0 %   MCV 92.9 78.0 - 100.0 fL   MCH 31.1 26.0 - 34.0 pg   MCHC 33.4 30.0 - 36.0 g/dL   RDW 16.7 (H) 11.5 - 15.5 %   Platelets 113 (L) 150 - 400 K/uL  Comprehensive metabolic panel     Status: Abnormal   Collection Time: 03/17/16  4:24 AM  Result Value Ref Range   Sodium 128 (L) 135 - 145 mmol/L   Potassium 3.3 (L) 3.5 - 5.1 mmol/L   Chloride 92 (L) 101 - 111 mmol/L   CO2 25 22 - 32 mmol/L   Glucose, Bld 96 65 - 99 mg/dL   BUN 13 6 - 20 mg/dL   Creatinine, Ser 0.69 0.61 - 1.24 mg/dL   Calcium 8.0 (L) 8.9 - 10.3 mg/dL   Total Protein 5.6 (L) 6.5 - 8.1 g/dL   Albumin 2.7 (L) 3.5 - 5.0 g/dL   AST 145 (H) 15 - 41 U/L   ALT 30 17 - 63 U/L   Alkaline Phosphatase 431 (H) 38 - 126 U/L   Total Bilirubin 5.0 (H) 0.3 - 1.2 mg/dL   GFR calc non Af Amer >60 >60 mL/min   GFR calc Af Amer >60 >60 mL/min   Anion gap 11 5 - 15  Magnesium     Status: Abnormal   Collection Time: 03/17/16  4:24 AM  Result Value Ref Range   Magnesium 1.1 (L) 1.7 - 2.4 mg/dL   CT neck without visible primary lesion or adenopathy.  SUBJECTIVE:  No change in throat symptoms  OBJECTIVE:  Voice cl  IMPRESSION:  LEFT pyriform sinus  lesion  PLAN:  For panendoscopy and biopsy tomorrow AM.  Discussed with pt.  Questions answered and informed consent obtained.    Nurse will sign consent form later.  Discussed with Dr. [name] and Dr. Erlinda Hong.   [name], [place]

## 2016-03-17 NOTE — Evaluation (Signed)
Physical Therapy Evaluation Patient Details Name: Adam Marshall MRN: [id number] DOB: March 18, 1961 Today's Date: 03/17/2016   History of Present Illness  55 yo male admitted with liver cirrhosis, ascities, acute resp failure. Hx of ETOH abuse, HTN, gout.   Clinical Impression  On eval, pt required Mod assist for mobility-walked ~15'x2 with RW. Gait is mildly ataxic. Pt is at risk for falls. He tolerated short distance well. Will continue to follow and progress activity as tolerated. Recommend ST rehab at SNF to improve strength, activity tolerance, gait, and balance.     Follow Up Recommendations SNF    Equipment Recommendations  None recommended by PT    Recommendations for Other Services       Precautions / Restrictions Precautions Precautions: Fall Restrictions Weight Bearing Restrictions: No      Mobility  Bed Mobility Overal bed mobility: Needs Assistance Bed Mobility: Supine to Sit     Supine to sit: Min assist     General bed mobility comments: assist to get to EOB. VCs required.   Transfers Overall transfer level: Needs assistance Equipment used: Rolling walker (2 wheeled) Transfers: Sit to/from Stand Sit to Stand: Mod assist         General transfer comment: Assist to rise, stabilize, control descent. VCs safety, hand placement  Ambulation/Gait Ambulation/Gait assistance: Mod assist Ambulation Distance (Feet): 15 Feet (x2) Assistive device: Rolling walker (2 wheeled) Gait Pattern/deviations: Step-through pattern;Decreased stride length;Trunk flexed;Decreased step length - right;Decreased step length - left;Ataxic     General Gait Details: mildly ataxic gait pattern. Assist to stabilize pt and maneuver with walker. Unsteady and at risk for falls  Information systems manager [place] (Stroke Patients Only)       Balance Overall balance assessment: Needs assistance         Standing balance support: During  functional activity Standing balance-Leahy Scale: Poor                               Pertinent Vitals/Pain Pain Assessment: No/denies pain    Home Living Family/patient expects to be discharged to:: Private residence Living Arrangements: Spouse/significant other   Type of Home: Apartment Home Access: Stairs to enter   [employer] of Steps: 2 Home Layout: Two level Home Equipment: Environmental consultant - 2 wheels;Cane - single point      Prior Function Level of Independence: Independent with assistive device(s)         Comments: used cane prior to admission     Hand Dominance        Extremity/Trunk Assessment   Upper Extremity Assessment: Generalized weakness           Lower Extremity Assessment: Generalized weakness      Cervical / Trunk Assessment: Kyphotic  Communication   Communication: No difficulties  Cognition Arousal/Alertness: Awake/alert Behavior During Therapy: WFL for tasks assessed/performed Overall Cognitive Status: Impaired/Different from baseline Area of Impairment: Safety/judgement;Problem solving         Safety/Judgement: Decreased awareness of safety;Decreased awareness of deficits   Problem Solving: Requires tactile cues;Requires verbal cues      General Comments      Exercises        Assessment/Plan    PT Assessment Patient needs continued PT services  PT Diagnosis Difficulty walking;Abnormality of gait;Generalized weakness   PT Problem List Decreased strength;Decreased activity tolerance;Decreased balance;Decreased mobility;Decreased knowledge of use  of DME;Decreased safety awareness;Decreased cognition  PT Treatment Interventions DME instruction;Gait training;Functional mobility training;Patient/family education;Therapeutic activities;Balance training;Therapeutic exercise   PT Goals (Current goals can be found in the Care Plan section) Acute Rehab PT Goals Patient Stated Goal: regain independence.  PT Goal  Formulation: With patient/family Time For Goal Achievement: 03/31/16 Potential to Achieve Goals: Good    Frequency Min 3X/week   Barriers to discharge        Co-evaluation               End of Session Equipment Utilized During Treatment: Gait belt Activity Tolerance: Patient tolerated treatment well Patient left: in chair;with call bell/phone within reach;with chair alarm set;with family/visitor present           Time: [id number] PT Time Calculation (min) (ACUTE ONLY): 25 min   Charges:   PT Evaluation $PT Eval Low Complexity: 1 Procedure PT Treatments $Gait Training: 8-22 mins   PT G Codes:        Weston Anna, MPT Pager: [phone]

## 2016-03-17 NOTE — Progress Notes (Signed)
CSW assisting with d/c planning. Pt has agreed to accept ST Rehab at d/c. Pt / son have requested placement at [facility]. SNF has confirmed with insurance that out of network placement will not result in increased out of pocket cost ( due to pt having met out of pocket deductible ). [facility] has begun [employer]. CSW will continue to follow to assist with d/c planning to SNF.  Werner Lean LCSW [phone]

## 2016-03-17 NOTE — Progress Notes (Signed)
Progress Note   Subjective  Adam Marshall is a 55 y/o [place] American male who was admitted on 03/14/16 for increasing abdominal distension.  This morning, the patient is found laying in bed surrounded by his son and brother. He tells me that he feels "better than yesterday", because they "drew the rest of that fluid off".  He denies any further melenic stools, new complaints or concerns.  His son does ask some questions regarding his long term care and prognosis. This was discussed and time was given for questions.   Objective   Vital signs in last 24 hours: Temp:  [98.8 F (37.1 C)-99.6 F (37.6 C)] 98.8 F (37.1 C) (07/13 0540) Pulse Rate:  [105-119] 105 (07/13 0540) Resp:  [16] 16 (07/13 0540) BP: (108-122)/(75-87) 116/81 mmHg (07/13 0540) SpO2:  [90 %-98 %] 98 % (07/13 0540) Last BM Date: 03/16/16 General: African American male in NAD Heart: Regular rate and rhythm; no murmurs Lungs: Respirations even and unlabored, lungs CTA bilaterally Abdomen: Soft, nontender and minimal distension Normal bowel sounds. Extremities: Without edema. Neurologic: Alert and oriented, grossly normal neurologically. Psych: Cooperative. Normal mood and affect.  Intake/Output from previous day: 07/12 0701 - 07/13 0700 In: 360 [P.O.:360] Out: 800 [Urine:800] Intake/Output this shift: Total I/O In: 120 [P.O.:120] Out: -   Lab Results:  Recent Labs  03/15/16 0305 03/15/16 1119 03/16/16 0432 03/17/16 0424  WBC 10.1  --  10.6* 10.3  HGB 11.1* 11.6* 11.0* 10.5*  HCT 31.9*  --  32.2* 31.4*  PLT 122*  --  116* 113*   BMET  Recent Labs  03/15/16 0305 03/16/16 0432 03/17/16 0424  NA 126* 128* 128*  K 3.4* 3.4* 3.3*  CL 89* 92* 92*  CO2 27 25 25   GLUCOSE 98 98 96  BUN 18 14 13   CREATININE 0.68 0.63 0.69  CALCIUM 8.4* 8.0* 8.0*   LFT  Recent Labs  03/17/16 0424  PROT 5.6*  ALBUMIN 2.7*  AST 145*  ALT 30  ALKPHOS 431*  BILITOT 5.0*   PT/INR No results for  input(s): LABPROT, INR in the last 72 hours.  Studies/Results: Ct Soft Tissue Neck W Contrast  03/16/2016  CLINICAL DATA:  Left piriform sinus mass lesion with airway impingement. EXAM: CT NECK WITH CONTRAST TECHNIQUE: Multidetector CT imaging of the neck was performed using the standard protocol following the bolus administration of intravenous contrast. CONTRAST:  20mL ISOVUE-300 IOPAMIDOL (ISOVUE-300) INJECTION 61% COMPARISON:  None. FINDINGS: Pharynx and larynx: No mucosal or submucosal mass is discernible by CT, with specific attention to the piriform sinuses. Endoscopy may be more sensitive to mucosal disease. Salivary glands: Submandibular and parotid glands are normal. Thyroid: Normal Lymph nodes: No enlarged or low-density nodes on either side of the neck. Vascular: Extensive atherosclerotic disease at both carotid bifurcations. This study was not done in the form of a CT angiogram, but stenosis is estimated at 20% in the right ICA and 50% in the left ICA. Venous structures appear normal. Limited intracranial: Negative Visualized orbits: Normal Mastoids and visualized paranasal sinuses: Clear Skeleton: Ordinary cervical spondylosis Upper chest: Advanced emphysema. Areas of interstitial density in both upper lungs most likely representing scarring. Pneumonia not excluded. Small layering effusions bilaterally. IMPRESSION: No mucosal or submucosal disease is identified, with specific attention to the left piriform sinus. Endoscopy would be more sensitive for mucosal disease. No lymphadenopathy. Aortic atherosclerosis. Carotid artery atherosclerosis. 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 Emphysema. Pulmonary scarring and/or upper lobe infiltrates.  Small pleural effusions. Electronically Signed   By: Nelson Chimes M.D.   On: 03/16/2016 17:51   US Paracentesis  03/16/2016  INDICATION: Cirrhosis, recurrent ascites. Request made for therapeutic paracentesis. EXAM: ULTRASOUND GUIDED THERAPEUTIC  PARACENTESIS MEDICATIONS: None. COMPLICATIONS: None immediate. PROCEDURE: Informed written consent was obtained from the patient after a discussion of the risks, benefits and alternatives to treatment. A timeout was performed prior to the initiation of the procedure. Initial ultrasound scanning demonstrates a moderate amount of ascites within the left lower abdominal quadrant. The left lower abdomen was prepped and draped in the usual sterile fashion. 1% lidocaine was used for local anesthesia. Following this, a Yueh catheter was introduced. An ultrasound image was saved for documentation purposes. The paracentesis was performed. The catheter was removed and a dressing was applied. The patient tolerated the procedure well without immediate post procedural complication. FINDINGS: A total of approximately 2.6 liters of yellow fluid was removed. IMPRESSION: Successful ultrasound-guided therapeutic paracentesis yielding 2.6 liters of peritoneal fluid. Read by: Rowe Robert, PA-C Electronically Signed   By: Jerilynn Mages.  Shick M.D.   On: 03/16/2016 14:58   03/15/16-EGD, Dr. [name]: LA grade D reflux esophagitis, nonbleeding gastric ulcer with no stigmata of bleeding, normal examined duodenum, exophytic and friable mass on the left piriform sinus, partially obstructing the airway, small hiatal hernia    Assessment / Plan:   Assessment: 1. Decompensated Etoh Cirrhosis: At time of admission, tense ascites, sodium decreased at 125, alkaline phosphatase elevated at 655 , AST at 138 with normal ALT at 28, total bili 2. LFT's continue to improve, repeat paracentesis yesterday with removal of 2.6 L of fluid 2. Hyponatremia: See above; secondary to cirrhosis 3. Anemia: hgb remaining stable at this time, EGD 03/15/16 with non bleeding ulcer and esophagitis 4. Acute hypoxic respiratory failure: improved 5. Alcohol use: Agree with rehab after discharge 6. Abnormal CT Abd/Pelvis: Cirrhosis-discussed above; also low attenuating lesion  in uncinate process of pancreas 7. 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 Started Aldactone 100mg  qd and Lasix 40mg  qd-will need to monitor electrolytes, with recheck of CMP in one week 2. PT to see the patient today to get him up and out of bed 3. Continue Protonix 40mg  BID 4. Low sodium diet <2G per day and 2L daily volume restriction 5. Appreciate ENT's recommendations regarding pyriform mass 6. After ENT eval, ok with patient discharge to rehab facility, will need follow in outpatient clinic 7. Discussed above with Dr. [name]  Thank you for your kind consultation, we will continue to follow  Principal Problem:   Cirrhosis of liver with ascites ([place]) Active Problems:   HYPERTENSION, BENIGN ESSENTIAL   Alcohol use ([place])   Liver failure (HCC)   Anemia   Respiratory failure with hypoxia (HCC)   Lesion of pancreas   Generalized abdominal pain   Hyponatremia   Melena   Ascites   Abnormal transaminases   Normochromic normocytic anemia   Pyriform sinus mass     LOS: 3 days   Levin Erp  03/17/2016, 10:29 AM  Pager # [phone]

## 2016-03-17 NOTE — Care Management Note (Signed)
Case Management Note  Patient Details  Name: Adam Marshall MRN: [id number] Date of Birth: 1960-10-07  Subjective/Objective:                  Cirrhosis of liver/ascites Action/Plan: SNF Expected Discharge Date:   (unknown)               Expected Discharge Plan:  [place]  In-House Referral:     Discharge planning Services     Post Acute Care Choice:    Choice offered to:     DME Arranged:    DME Agency:     HH Arranged:    [place] Agency:     Status of Service:  Completed, signed off  If discussed at [employer], dates discussed:    Additional Comments: Cm notes pt to go to SNF; CSW aware and arranging.  NO other CM needs were communicated. Dellie Catholic, RN 03/17/2016, 2:15 PM

## 2016-03-17 NOTE — Progress Notes (Signed)
PROGRESS NOTE  Adam Marshall [id number] DOB: [birth year] DOA: 03/14/2016 PCP: [name], FNP  HPI/Recap of past 24 hours:   laying in bed, NAD, denies pain,. No confusion, no agitation, son and brother in room  Assessment/Plan: Principal Problem:   Cirrhosis of liver with ascites ([place]) Active Problems:   HYPERTENSION, BENIGN ESSENTIAL   Alcohol use (HCC)   Liver failure (HCC)   Anemia   Respiratory failure with hypoxia (HCC)   Lesion of pancreas   Generalized abdominal pain   Hyponatremia   Melena   Ascites   Abnormal transaminases   Normochromic normocytic anemia   Pyriform sinus mass   1-Acute hypoxic Respiratory Failure:  This could be multifactorial, might have underline emphysema,  anemia, ?PNA, Abdominal distension.  He was admitted to step down initially He received one dose of levaquin on 7/10 2 units PRBC.  Paracentesis.  Improved, now on room air , continue abx, add incentive spirometer  2-Cirrhosis Liver/ascites; In setting of alcohol, fatty liver.  paracentesis for ascites.  Albumin, No ab pain GI input appreciated,   3-Hyponatremia; could be secondary to cirrhosis and or dehydration.  Would Avoid IV fluids due to significant ascites. Might be able to give IV fluids after paracentesis.  Clinically dry, though does has ascites  4-Anemia:  hgb 7 on admission, Last hb per records 5 months ago was at 11.  B12/folate/iron panel unremarkable/tsh wnl S/p prbc transfusionx2 units on 7/10 EGd with LA grad D reflux esophagitis, and nonbleeding gastric ulcer,  ppi bid for 8 weeks per GI recommendation    5-PNA; He report productive cough, chest x ray with possible infiltrates. Leukocytosis.  continue Levaquin.   6-Alcohol use: High risk for withdrawal.  CIWA protocol ordered.   7-Hypokalemia/hypomagnesemia;  Replace prn to keep k>4, mag>2   8. Left pyriform sinus mass:  Concerning for malignancy, with h/o alcohol/smoking and  significant weight loss ENT consult, Dr [name] from [facility] paged, [phone].  9. Sinus tachycardia, start low dose lopressor with holding parameter  10 FTT, weakness, Severe malnutrition in context of chronic illness  Body mass index is 18.82 kg/(m^2).  HIV negative, Nutrition consulted, Physical therapy   DVT prophylaxis: SCD Code Status: Full code.  Family Communication: care discussed with patient , his son and brother in room Disposition Plan: pending clinical improvement  Consults called: GI/ENT   Procedures: Paracentesis 3liters removed on 7/10, 2.6liters removed on 7/12 egd 7/11 prbc  Transfusion x2 on 7/10  Antibiotics:  none   Objective: BP 116/81 mmHg  Pulse 105  Temp(Src) 98.8 F (37.1 C) (Oral)  Resp 16  Ht 5' 5.5" (1.664 m)  Wt 52.1 kg (114 lb 13.8 oz)  BMI 18.82 kg/m2  SpO2 98%  Intake/Output Summary (Last 24 hours) at 03/17/16 1029 Last data filed at 03/17/16 0827  Gross per 24 hour  Intake    480 ml  Output    600 ml  Net   -120 ml   Filed Weights   03/14/16 0741 03/14/16 0856 03/15/16 0000  Weight: 49.896 kg (110 lb) 54.3 kg (119 lb 11.4 oz) 52.1 kg (114 lb 13.8 oz)    Exam:   General:  Frail, significant underweight, temporal wasting , NAD  Cardiovascular: sinus tachycardia  Respiratory: CTABL  Abdomen: distended, non tender, , positive BS  Musculoskeletal: No Edema  Neuro: aaox3  Data Reviewed: Basic Metabolic Panel:  Recent Labs Lab 03/14/16 0222 03/14/16 0736 03/15/16 0305 03/16/16 0432 03/17/16 0424  NA 125*  --  126* 128* 128*  K 3.4*  --  3.4* 3.4* 3.3*  CL 85*  --  89* 92* 92*  CO2 24  --  27 25 25   GLUCOSE 116*  --  98 98 96  BUN 12  --  18 14 13   CREATININE 0.62  --  0.68 0.63 0.69  CALCIUM 8.5*  --  8.4* 8.0* 8.0*  MG  --  1.4*  --   --  1.1*   Liver Function Tests:  Recent Labs Lab 03/14/16 0222 03/15/16 0305 03/17/16 0424  AST 138* 99* 145*  ALT 28 22 30   ALKPHOS 655* 458* 431*  BILITOT 2.4*  5.8* 5.0*  PROT 6.6 5.9* 5.6*  ALBUMIN 3.0* 2.8* 2.7*    Recent Labs Lab 03/14/16 0222  LIPASE 47   No results for input(s): AMMONIA in the last 168 hours. CBC:  Recent Labs Lab 03/14/16 0222 03/15/16 0305 03/15/16 1119 03/16/16 0432 03/17/16 0424  WBC 13.9* 10.1  --  10.6* 10.3  NEUTROABS 11.6*  --   --  9.0*  --   HGB 7.1* 11.1* 11.6* 11.0* 10.5*  HCT 21.1* 31.9*  --  32.2* 31.4*  MCV 95.0 90.1  --  91.7 92.9  PLT 190 122*  --  116* 113*   Cardiac Enzymes:   No results for input(s): CKTOTAL, CKMB, CKMBINDEX, TROPONINI in the last 168 hours. BNP (last 3 results) No results for input(s): BNP in the last 8760 hours.  ProBNP (last 3 results) No results for input(s): PROBNP in the last 8760 hours.  CBG:  Recent Labs Lab 03/15/16 1117  GLUCAP 73    Recent Results (from the past 240 hour(s))  MRSA PCR Screening     Status: None   Collection Time: 03/14/16  9:00 AM  Result Value Ref Range Status   MRSA by PCR NEGATIVE NEGATIVE Final    Comment:        The GeneXpert MRSA Assay (FDA approved for NASAL specimens only), is one component of a comprehensive MRSA colonization surveillance program. It is not intended to diagnose MRSA infection nor to guide or monitor treatment for MRSA infections.   Culture, blood (routine x 2) Call MD if unable to obtain prior to antibiotics being given     Status: None (Preliminary result)   Collection Time: 03/14/16  9:13 AM  Result Value Ref Range Status   Specimen Description BLOOD BLOOD RIGHT HAND  Final   Special Requests IN PEDIATRIC BOTTLE 2CC  Final   Culture   Final    NO GROWTH 2 DAYS Performed at [facility]    Report Status PENDING  Incomplete  Culture, blood (routine x 2) Call MD if unable to obtain prior to antibiotics being given     Status: None (Preliminary result)   Collection Time: 03/14/16  9:13 AM  Result Value Ref Range Status   Specimen Description BLOOD RIGHT ARM  Final   Special Requests  BOTTLES DRAWN AEROBIC AND ANAEROBIC 5CC  Final   Culture   Final    NO GROWTH 2 DAYS Performed at [facility]    Report Status PENDING  Incomplete  Culture, body fluid-bottle     Status: None (Preliminary result)   Collection Time: 03/14/16  5:28 PM  Result Value Ref Range Status   Specimen Description FLUID ASCITIC  Final   Special Requests BOTTLES DRAWN AEROBIC AND ANAEROBIC 10CC  Final   Culture   Final    NO GROWTH 2 DAYS  Performed at [facility]    Report Status PENDING  Incomplete  Gram stain     Status: None   Collection Time: 03/14/16  5:28 PM  Result Value Ref Range Status   Specimen Description FLUID ASCITIC  Final   Special Requests NONE  Final   Gram Stain   Final    FEW WBC PRESENT, PREDOMINANTLY MONONUCLEAR NO ORGANISMS SEEN Performed at [facility]    Report Status 03/14/2016 FINAL  Final  Culture, sputum-assessment     Status: None   Collection Time: 03/15/16 12:58 AM  Result Value Ref Range Status   Specimen Description SPUTUM  Final   Special Requests NONE  Final   Sputum evaluation   Final    MICROSCOPIC FINDINGS SUGGEST THAT THIS SPECIMEN IS NOT REPRESENTATIVE OF LOWER RESPIRATORY SECRETIONS. PLEASE RECOLLECT. SPOKE TO H.PENG RN [phone] [id number] [email or web address]    Report Status 03/15/2016 FINAL  Final     Studies: Ct Soft Tissue Neck W Contrast  03/16/2016  CLINICAL DATA:  Left piriform sinus mass lesion with airway impingement. EXAM: CT NECK WITH CONTRAST TECHNIQUE: Multidetector CT imaging of the neck was performed using the standard protocol following the bolus administration of intravenous contrast. CONTRAST:  68mL ISOVUE-300 IOPAMIDOL (ISOVUE-300) INJECTION 61% COMPARISON:  None. FINDINGS: Pharynx and larynx: No mucosal or submucosal mass is discernible by CT, with specific attention to the piriform sinuses. Endoscopy may be more sensitive to mucosal disease. Salivary glands: Submandibular and parotid glands are normal. Thyroid: Normal  Lymph nodes: No enlarged or low-density nodes on either side of the neck. Vascular: Extensive atherosclerotic disease at both carotid bifurcations. This study was not done in the form of a CT angiogram, but stenosis is estimated at 20% in the right ICA and 50% in the left ICA. Venous structures appear normal. Limited intracranial: Negative Visualized orbits: Normal Mastoids and visualized paranasal sinuses: Clear Skeleton: Ordinary cervical spondylosis Upper chest: Advanced emphysema. Areas of interstitial density in both upper lungs most likely representing scarring. Pneumonia not excluded. Small layering effusions bilaterally. IMPRESSION: No mucosal or submucosal disease is identified, with specific attention to the left piriform sinus. Endoscopy would be more sensitive for mucosal disease. No lymphadenopathy. Aortic atherosclerosis. Carotid artery atherosclerosis. 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 Emphysema. Pulmonary scarring and/or upper lobe infiltrates. Small pleural effusions. Electronically Signed   By: Nelson Chimes M.D.   On: 03/16/2016 17:51   US Paracentesis  03/16/2016  INDICATION: Cirrhosis, recurrent ascites. Request made for therapeutic paracentesis. EXAM: ULTRASOUND GUIDED THERAPEUTIC PARACENTESIS MEDICATIONS: None. COMPLICATIONS: None immediate. PROCEDURE: Informed written consent was obtained from the patient after a discussion of the risks, benefits and alternatives to treatment. A timeout was performed prior to the initiation of the procedure. Initial ultrasound scanning demonstrates a moderate amount of ascites within the left lower abdominal quadrant. The left lower abdomen was prepped and draped in the usual sterile fashion. 1% lidocaine was used for local anesthesia. Following this, a Yueh catheter was introduced. An ultrasound image was saved for documentation purposes. The paracentesis was performed. The catheter was removed and a dressing was applied. The patient  tolerated the procedure well without immediate post procedural complication. FINDINGS: A total of approximately 2.6 liters of yellow fluid was removed. IMPRESSION: Successful ultrasound-guided therapeutic paracentesis yielding 2.6 liters of peritoneal fluid. Read by: Rowe Robert, PA-C Electronically Signed   By: Jerilynn Mages.  Shick M.D.   On: 03/16/2016 14:58    Scheduled Meds: . allopurinol  100 mg  Oral Daily  . feeding supplement (ENSURE ENLIVE)  237 mL Oral TID BM  . fluticasone  1 spray Each Nare Daily  . folic acid  1 mg Oral Daily  . furosemide  20 mg Intravenous Once  . levofloxacin (LEVAQUIN) IV  750 mg Intravenous Q24H  . lidocaine  15 mL Mouth/Throat Once  . magnesium sulfate 1 - 4 g bolus IVPB  4 g Intravenous Once  . metoprolol tartrate  12.5 mg Oral BID  . mupirocin ointment   Nasal BID  . nicotine  14 mg Transdermal Daily  . pantoprazole  40 mg Oral BID AC  . polyvinyl alcohol  1 drop Both Eyes BID  . potassium chloride  10 mEq Intravenous Once  . potassium chloride  40 mEq Oral Once  . sodium chloride flush  3 mL Intravenous Q12H  . sodium chloride flush  3 mL Intravenous Q12H  . thiamine  100 mg Oral Daily    Continuous Infusions:    Time spent: 35mins  Delsy Etzkorn MD, PhD  [facility] Pager [phone]. If 7PM-7AM, please contact night-coverage at [email or web address], password [facility] 03/17/2016, 10:29 AM  LOS: 3 days

## 2016-03-18 ENCOUNTER — Encounter (HOSPITAL_COMMUNITY)
Admission: EM | Disposition: A | Discharge status: Skilled Nursing Facility | Payer: Self-pay | Source: Home / Self Care | Attending: Internal Medicine

## 2016-03-18 ENCOUNTER — Inpatient Hospital Stay (HOSPITAL_COMMUNITY): Payer: Medicare HMO | Admitting: Anesthesiology

## 2016-03-18 ENCOUNTER — Encounter (HOSPITAL_COMMUNITY): Payer: Self-pay | Admitting: Otolaryngology

## 2016-03-18 ENCOUNTER — Ambulatory Visit
Admit: 2016-03-18 | Discharge: 2016-03-18 | Disposition: A | Discharge status: Home / Self Care | Payer: Medicare HMO | Attending: Radiation Oncology | Admitting: Radiation Oncology

## 2016-03-18 DIAGNOSIS — Z510 Encounter for antineoplastic radiation therapy: Secondary | ICD-10-CM | POA: Insufficient documentation

## 2016-03-18 DIAGNOSIS — C61 Malignant neoplasm of prostate: Secondary | ICD-10-CM | POA: Insufficient documentation

## 2016-03-18 DIAGNOSIS — C319 Malignant neoplasm of accessory sinus, unspecified: Secondary | ICD-10-CM | POA: Insufficient documentation

## 2016-03-18 HISTORY — PX: PANENDOSCOPY: SHX2159

## 2016-03-18 LAB — COMPREHENSIVE METABOLIC PANEL
ALT: 38 U/L (ref 17–63)
AST: 136 U/L — AB (ref 15–41)
Albumin: 2.6 g/dL — ABNORMAL LOW (ref 3.5–5.0)
Alkaline Phosphatase: 492 U/L — ABNORMAL HIGH (ref 38–126)
Anion gap: 9 (ref 5–15)
BILIRUBIN TOTAL: 6.3 mg/dL — AB (ref 0.3–1.2)
BUN: 14 mg/dL (ref 6–20)
CALCIUM: 8.1 mg/dL — AB (ref 8.9–10.3)
CO2: 26 mmol/L (ref 22–32)
Chloride: 92 mmol/L — ABNORMAL LOW (ref 101–111)
Creatinine, Ser: 0.73 mg/dL (ref 0.61–1.24)
GFR calc Af Amer: >60 mL/min (ref >60)
Glucose, Bld: 94 mg/dL (ref 65–99)
POTASSIUM: 3.8 mmol/L (ref 3.5–5.1)
Sodium: 127 mmol/L — ABNORMAL LOW (ref 135–145)
TOTAL PROTEIN: 5.5 g/dL — AB (ref 6.5–8.1)

## 2016-03-18 LAB — BASIC METABOLIC PANEL
BUN: 14 mg/dL (ref 4–21)
Creatinine: 0.7 mg/dL (ref 0.6–1.3)
GLUCOSE: 94 mg/dL
Potassium: 3.8 mmol/L (ref 3.4–5.3)
Sodium: 127 mmol/L — AB (ref 137–147)

## 2016-03-18 LAB — CBC AND DIFFERENTIAL
HEMATOCRIT: 32 % — AB (ref 41–53)
HEMOGLOBIN: 10.8 g/dL — AB (ref 13.5–17.5)
PLATELETS: 108 10*3/uL — AB (ref 150–399)
WBC: 9.6 10*3/mL

## 2016-03-18 LAB — CBC
HEMATOCRIT: 32.1 % — AB (ref 39.0–52.0)
HEMOGLOBIN: 10.8 g/dL — AB (ref 13.0–17.0)
MCH: 31.2 pg (ref 26.0–34.0)
MCHC: 33.6 g/dL (ref 30.0–36.0)
MCV: 92.8 fL (ref 78.0–100.0)
Platelets: 108 10*3/uL — ABNORMAL LOW (ref 150–400)
RBC: 3.46 MIL/uL — ABNORMAL LOW (ref 4.22–5.81)
RDW: 16.7 % — ABNORMAL HIGH (ref 11.5–15.5)
WBC: 9.6 10*3/uL (ref 4.0–10.5)

## 2016-03-18 LAB — HEPATIC FUNCTION PANEL: Bilirubin, Total: 6.3 mg/dL

## 2016-03-18 LAB — PROTIME-INR
INR: 1.35 (ref 0.00–1.49)
PROTHROMBIN TIME: 16.3 s — AB (ref 11.6–15.2)

## 2016-03-18 LAB — MAGNESIUM: Magnesium: 1.5 mg/dL — ABNORMAL LOW (ref 1.7–2.4)

## 2016-03-18 SURGERY — LARYNGOSCOPY, WITH BRONCHOSCOPY AND ESOPHAGOSCOPY
Anesthesia: General | Site: Mouth

## 2016-03-18 MED ORDER — LIDOCAINE HCL (CARDIAC) 20 MG/ML IV SOLN
INTRAVENOUS | Status: DC | PRN
  Administered 2016-03-18: 100 mg via INTRAVENOUS

## 2016-03-18 MED ORDER — LIDOCAINE HCL (CARDIAC) 20 MG/ML IV SOLN
INTRAVENOUS | Status: AC
  Filled 2016-03-18: qty 5

## 2016-03-18 MED ORDER — FENTANYL CITRATE (PF) 100 MCG/2ML IJ SOLN
INTRAMUSCULAR | Status: AC
  Filled 2016-03-18: qty 2

## 2016-03-18 MED ORDER — LACTATED RINGERS IV SOLN
INTRAVENOUS | Status: DC | PRN
  Administered 2016-03-18: 07:00:00 via INTRAVENOUS

## 2016-03-18 MED ORDER — PROPOFOL 10 MG/ML IV BOLUS
INTRAVENOUS | Status: AC
Start: 2016-03-18 — End: 2016-03-18
  Filled 2016-03-18: qty 20

## 2016-03-18 MED ORDER — SUCCINYLCHOLINE CHLORIDE 20 MG/ML IJ SOLN
INTRAMUSCULAR | Status: DC | PRN
  Administered 2016-03-18: 100 mg via INTRAVENOUS

## 2016-03-18 MED ORDER — PROMETHAZINE HCL 25 MG/ML IJ SOLN: 6.2500 mg | INTRAMUSCULAR | Status: DC | PRN

## 2016-03-18 MED ORDER — ROCURONIUM BROMIDE 100 MG/10ML IV SOLN
INTRAVENOUS | Status: AC
  Filled 2016-03-18: qty 1

## 2016-03-18 MED ORDER — MIDAZOLAM HCL 5 MG/5ML IJ SOLN
INTRAMUSCULAR | Status: DC | PRN
  Administered 2016-03-18: 2 mg via INTRAVENOUS

## 2016-03-18 MED ORDER — MIDAZOLAM HCL 2 MG/2ML IJ SOLN
INTRAMUSCULAR | Status: AC
  Filled 2016-03-18: qty 2

## 2016-03-18 MED ORDER — ONDANSETRON HCL 4 MG/2ML IJ SOLN
INTRAMUSCULAR | Status: DC | PRN
  Administered 2016-03-18: 4 mg via INTRAVENOUS

## 2016-03-18 MED ORDER — MAGNESIUM SULFATE 4 GM/100ML IV SOLN
4.0000 g | Freq: Once | INTRAVENOUS | Status: AC
  Administered 2016-03-18: 4 g via INTRAVENOUS
  Filled 2016-03-18: qty 100

## 2016-03-18 MED ORDER — 0.9 % SODIUM CHLORIDE (POUR BTL) OPTIME
TOPICAL | Status: DC | PRN
  Administered 2016-03-18: 1000 mL

## 2016-03-18 MED ORDER — PROPOFOL 10 MG/ML IV BOLUS
INTRAVENOUS | Status: DC | PRN
  Administered 2016-03-18: 150 mg via INTRAVENOUS

## 2016-03-18 MED ORDER — ONDANSETRON HCL 4 MG/2ML IJ SOLN
INTRAMUSCULAR | Status: AC
  Filled 2016-03-18: qty 2

## 2016-03-18 MED ORDER — FENTANYL CITRATE (PF) 100 MCG/2ML IJ SOLN
INTRAMUSCULAR | Status: DC | PRN
  Administered 2016-03-18: 50 ug via INTRAVENOUS

## 2016-03-18 MED ORDER — HYDROMORPHONE HCL 1 MG/ML IJ SOLN: 0.2500 mg | INTRAMUSCULAR | Status: DC | PRN

## 2016-03-18 SURGICAL SUPPLY — 11 items
COVER BACK TABLE 60X90IN (DRAPES) ×2 IMPLANT
DISSECTOR ROUND CHERRY 3/8 STR (MISCELLANEOUS) ×2 IMPLANT
DRAPE SHEET LG 3/4 BI-LAMINATE (DRAPES) ×2 IMPLANT
GAUZE SPONGE 4X4 12PLY STRL (GAUZE/BANDAGES/DRESSINGS) ×2 IMPLANT
GLOVE ECLIPSE 8.0 STRL XLNG CF (GLOVE) ×2 IMPLANT
KIT BASIN OR (CUSTOM PROCEDURE TRAY) ×2 IMPLANT
LUBRICANT JELLY K Y 4OZ (MISCELLANEOUS) ×6 IMPLANT
NS IRRIG 1000ML POUR BTL (IV SOLUTION) ×2 IMPLANT
SYRINGE 10CC LL (SYRINGE) ×2 IMPLANT
TRAP SPECIMEN MUCOUS 40CC (MISCELLANEOUS) ×6 IMPLANT
TUBING CONNECTING 10 (TUBING) ×2 IMPLANT

## 2016-03-18 NOTE — Anesthesia Preprocedure Evaluation (Addendum)
Anesthesia Evaluation  Patient identified by MRN, date of birth, ID band Patient awake    Reviewed: Allergy & Precautions, NPO status , Patient's Chart, lab work & pertinent test results, reviewed documented beta blocker date and time   Airway Mallampati: II  TM Distance: >3 FB Neck ROM: Full    Dental no notable dental hx. (+) Poor Dentition, Dental Advisory Given   Pulmonary Current Smoker,    Pulmonary exam normal breath sounds clear to auscultation       Cardiovascular hypertension, Pt. on medications and Pt. on home beta blockers  Rhythm:Regular Rate:Tachycardia     Neuro/Psych negative neurological ROS  negative psych ROS   GI/Hepatic (+) Cirrhosis   ascites  substance abuse  alcohol use, Chronic pancreatitis    Endo/Other  Gout  Renal/GU negative Renal ROS  negative genitourinary   Musculoskeletal Chronic Knee and back pain Gout    Abdominal (+) scaphoid   Peds  Hematology  (+) anemia ,   Anesthesia Other Findings   Reproductive/Obstetrics                            Anesthesia Physical  Anesthesia Plan  ASA: III  Anesthesia Plan: General   Post-op Pain Management:    Induction: Intravenous  Airway Management Planned: Oral ETT  Additional Equipment:   Intra-op Plan:   Post-operative Plan: Extubation in OR  Informed Consent: I have reviewed the patients History and Physical, chart, labs and discussed the procedure including the risks, benefits and alternatives for the proposed anesthesia with the patient or authorized representative who has indicated his/her understanding and acceptance.   Dental advisory given  Plan Discussed with: CRNA, Anesthesiologist and Surgeon  Anesthesia Plan Comments:         Anesthesia Quick Evaluation

## 2016-03-18 NOTE — H&P (View-Only) (Signed)
03/17/2016 12:51 PM  Burnard Leigh [id number]      Temp:  [98.8 F (37.1 C)-99.6 F (37.6 C)] 98.8 F (37.1 C) (07/13 0540) Pulse Rate:  [105-119] 105 (07/13 0540) Resp:  [16] 16 (07/13 0540) BP: (108-122)/(75-87) 116/81 mmHg (07/13 0540) SpO2:  [90 %-98 %] 98 % (07/13 0540),     Intake/Output Summary (Last 24 hours) at 03/17/16 1251 Last data filed at 03/17/16 1103  Gross per 24 hour  Intake    243 ml  Output    850 ml  Net   -607 ml    Results for orders placed or performed during the hospital encounter of 03/14/16 (from the past 24 hour(s))  CBC     Status: Abnormal   Collection Time: 03/17/16  4:24 AM  Result Value Ref Range   WBC 10.3 4.0 - 10.5 K/uL   RBC 3.38 (L) 4.22 - 5.81 MIL/uL   Hemoglobin 10.5 (L) 13.0 - 17.0 g/dL   HCT 31.4 (L) 39.0 - 52.0 %   MCV 92.9 78.0 - 100.0 fL   MCH 31.1 26.0 - 34.0 pg   MCHC 33.4 30.0 - 36.0 g/dL   RDW 16.7 (H) 11.5 - 15.5 %   Platelets 113 (L) 150 - 400 K/uL  Comprehensive metabolic panel     Status: Abnormal   Collection Time: 03/17/16  4:24 AM  Result Value Ref Range   Sodium 128 (L) 135 - 145 mmol/L   Potassium 3.3 (L) 3.5 - 5.1 mmol/L   Chloride 92 (L) 101 - 111 mmol/L   CO2 25 22 - 32 mmol/L   Glucose, Bld 96 65 - 99 mg/dL   BUN 13 6 - 20 mg/dL   Creatinine, Ser 0.69 0.61 - 1.24 mg/dL   Calcium 8.0 (L) 8.9 - 10.3 mg/dL   Total Protein 5.6 (L) 6.5 - 8.1 g/dL   Albumin 2.7 (L) 3.5 - 5.0 g/dL   AST 145 (H) 15 - 41 U/L   ALT 30 17 - 63 U/L   Alkaline Phosphatase 431 (H) 38 - 126 U/L   Total Bilirubin 5.0 (H) 0.3 - 1.2 mg/dL   GFR calc non Af Amer >60 >60 mL/min   GFR calc Af Amer >60 >60 mL/min   Anion gap 11 5 - 15  Magnesium     Status: Abnormal   Collection Time: 03/17/16  4:24 AM  Result Value Ref Range   Magnesium 1.1 (L) 1.7 - 2.4 mg/dL   CT neck without visible primary lesion or adenopathy.  SUBJECTIVE:  No change in throat symptoms  OBJECTIVE:  Voice cl  IMPRESSION:  LEFT pyriform sinus  lesion  PLAN:  For panendoscopy and biopsy tomorrow AM.  Discussed with pt.  Questions answered and informed consent obtained.    Nurse will sign consent form later.  Discussed with Dr. [name] and Dr. Erlinda Hong.   Adam Marshall, [place]

## 2016-03-18 NOTE — Anesthesia Postprocedure Evaluation (Signed)
Anesthesia Post Note  Patient: Adam Marshall  Procedure(s) Performed: Procedure(s) (LRB): DIRECT LARYNGOSCOPY, WITH BRONCHOSCOPY, AND ESOPHAGOSCOPY, WITH BIOPSY (N/A)  Patient location during evaluation: PACU Anesthesia Type: General Level of consciousness: sedated Pain management: pain level controlled Vital Signs Assessment: post-procedure vital signs reviewed and stable Respiratory status: spontaneous breathing and respiratory function stable Cardiovascular status: stable Anesthetic complications: no    Last Vitals:  Filed Vitals:   03/18/16 0845 03/18/16 0900  BP: 118/91 118/91  Pulse: 101 99  Temp:    Resp: 15 12    Last Pain:  Filed Vitals:   03/18/16 0903  PainSc: 0-No pain                 Tyriana Helmkamp DANIEL

## 2016-03-18 NOTE — Progress Notes (Signed)
[place]         [phone] ________________________________  Initial INPATIENT Consultation  Name: Adam Marshall MRN: [id number]  Date: 03/14/2016  DOB: 06-29-1961  REFERRING PHYSICIAN: No ref. provider found  DIAGNOSIS: The primary encounter diagnosis was Ascites. Diagnoses of Cirrhosis of liver with ascites, unspecified hepatic cirrhosis type ([place]), Hyponatremia, Normochromic normocytic anemia, Lesion of pancreas, Abdominal distension, Abnormal transaminases, and Laryngeal mass were also pertinent to this visit.    ICD-9-CM ICD-10-CM   1. Ascites 789.59 R18.8 US Paracentesis     US Paracentesis  2. Cirrhosis of liver with ascites, unspecified hepatic cirrhosis type (HCC) 571.5 K74.60   3. Hyponatremia 276.1 E87.1   4. Normochromic normocytic anemia 285.9 D64.9   5. Lesion of pancreas 577.9 K86.9   6. Abdominal distension 787.3 R14.0 US Paracentesis     US Paracentesis     CANCELED: IR Paracentesis     CANCELED: IR Paracentesis     CANCELED: US Paracentesis     CANCELED: US Paracentesis  7. Abnormal transaminases 790.4 R74.0 US Abdomen Limited RUQ     US Abdomen Limited RUQ  8. 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 male who is kindly referred to [place] for treatment of Left pyriform sinus lesion found on upper GI endoscopy by Dr. [name] on 03/15/2016. He was originally admitted to the ED on 03/14/2016 for ascites. After the EGD on 03/15/2016, he underwent a direct laryngoscopy with bronchoscopy and esophagoscopy with biopsy on 03/18/2016 with Dr. [name]. Biopsy pending. 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  PREVIOUS RADIATION THERAPY: No  Past Medical History  Diagnosis Date  . Hypertension   . Chronic back pain   . Gout   . Chronic knee pain     left  .  Fatty liver   . Abnormal LFTs   . Pancreatitis   :  Past Surgical History  Procedure Laterality Date  . Knee surgery      left  . Esophagogastroduodenoscopy N/A 03/15/2016    Procedure: ESOPHAGOGASTRODUODENOSCOPY (EGD);  Surgeon: Doran Stabler, MD;  Location: [facility];  Service: Endoscopy;  Laterality: N/A;  :   Current facility-administered medications:  .  0.9 %  sodium chloride infusion, 250 mL, Intravenous, PRN, Belkys A Regalado, MD, Last Rate: 10 mL/hr at 03/14/16 1120, 500 mL at 03/14/16 1120 .  0.9 %  sodium chloride infusion, 250 mL, Intravenous, PRN, Belkys A Regalado, MD, Last Rate: 10 mL/hr at 03/14/16 1120, 1,000 mL at 03/14/16 1120 .  albuterol (PROVENTIL) (2.5 MG/3ML) 0.083% nebulizer solution 2.5 mg, 2.5 mg, Nebulization, Q6H PRN, Belkys A Regalado, MD .  allopurinol (ZYLOPRIM) tablet 100 mg, 100 mg, Oral, Daily, [name], MD, 123XX123 mg at 0000000 1215 .  colchicine tablet 0.6 mg, 0.6 mg, Oral, Daily PRN, Belkys A Regalado, MD .  feeding supplement (ENSURE ENLIVE) (ENSURE ENLIVE) liquid 237 mL, 237 mL, Oral, TID BM, Belkys A Regalado, MD, 237 mL at 03/17/16 2000 .  fluticasone (FLONASE) 50 MCG/ACT nasal spray 1 spray, 1 spray, Each Nare, Daily, [name], MD, 1 spray at 03/16/16 1022 .  folic acid (FOLVITE) tablet 1 mg, 1 mg, Oral, Daily, Belkys A Regalado, MD, 1 mg at 03/18/16 1215 .  furosemide (LASIX) injection 20  mg, 20 mg, Intravenous, Once, Belkys A Regalado, MD, 20 mg at 03/14/16 0730 .  furosemide (LASIX) tablet 40 mg, 40 mg, Oral, Daily, Lavone Nian [place], [place], 40 mg at 03/18/16 1215 .  ketorolac (TORADOL) 15 MG/ML injection 7.5 mg, 7.5 mg, Intravenous, Q8H PRN, Florencia Reasons, MD, 7.5 mg at 03/17/16 2041 .  levofloxacin (LEVAQUIN) IVPB 750 mg, 750 mg, Intravenous, Q24H, Florencia Reasons, MD, 750 mg at 03/18/16 1328 .  lidocaine (XYLOCAINE) 2 % viscous mouth solution 15 mL, 15 mL, Mouth/Throat, Once, [name], MD .  LORazepam (ATIVAN) tablet 1 mg, 1 mg, Oral,  Q4H PRN, Tawni Millers, MD, 1 mg at 03/15/16 2135 .  metoprolol tartrate (LOPRESSOR) tablet 12.5 mg, 12.5 mg, Oral, BID, Florencia Reasons, MD, 12.5 mg at 03/18/16 1214 .  mupirocin ointment (BACTROBAN) 2 %, , Nasal, BID, [name], MD .  nicotine (NICODERM CQ - dosed in mg/24 hours) patch 14 mg, 14 mg, Transdermal, Daily, Belkys A Regalado, MD, 14 mg at 03/18/16 1213 .  ondansetron (ZOFRAN) tablet 4 mg, 4 mg, Oral, Q6H PRN **OR** ondansetron (ZOFRAN) injection 4 mg, 4 mg, Intravenous, Q6H PRN, Belkys A Regalado, MD, 4 mg at 03/14/16 1123 .  pantoprazole (PROTONIX) EC tablet 40 mg, 40 mg, Oral, BID AC, [name], MD, 40 mg at 03/17/16 1655 .  polyvinyl alcohol (LIQUIFILM TEARS) 1.4 % ophthalmic solution 1 drop, 1 drop, Both Eyes, BID, Florencia Reasons, MD, 1 drop at 03/17/16 2041 .  potassium chloride 10 mEq in 100 mL IVPB, 10 mEq, Intravenous, Once, [employer], [place] .  sodium chloride (OCEAN) 0.65 % nasal spray 2 spray, 2 spray, Each Nare, PRN, [name], MD .  sodium chloride flush (NS) 0.9 % injection 3 mL, 3 mL, Intravenous, Q12H, Belkys A Regalado, MD, 3 mL at 03/17/16 1000 .  sodium chloride flush (NS) 0.9 % injection 3 mL, 3 mL, Intravenous, PRN, Belkys A Regalado, MD .  sodium chloride flush (NS) 0.9 % injection 3 mL, 3 mL, Intravenous, Q12H, Belkys A Regalado, MD, 3 mL at 03/17/16 2042 .  sodium chloride flush (NS) 0.9 % injection 3 mL, 3 mL, Intravenous, PRN, Belkys A Regalado, MD .  thiamine (VITAMIN B-1) tablet 100 mg, 100 mg, Oral, Daily, Belkys A Regalado, MD, 100 mg at 03/18/16 1216:  Allergies  Allergen Reactions  . Penicillins Hives and Rash    Has patient had a PCN reaction causing immediate rash, facial/tongue/throat swelling, SOB or lightheadedness with hypotension:Yes Has patient had a PCN reaction causing severe rash involving mucus membranes or skin necrosis:unsure Has patient had a PCN reaction that required hospitalization:No Has patient had a PCN reaction  occurring within the last 10 years:No  If all of the above answers are "NO", then may proceed with Cephalosporin use.   :  Family History  Problem Relation Age of Onset  . Diabetes Mother   . Diabetes Maternal Grandmother   :  Social History   Social History  . Marital Status: Single    Spouse Name: N/A  . Number of Children: 1  . Years of Education: 14   Occupational History  . Disability     Knee / Back   Social History Main Topics  . Smoking status: Current Every Day Smoker -- 0.30 packs/day for 30 years    Types: Cigarettes  . Smokeless tobacco: Never Used  . Alcohol Use: 16.8 oz/week    28 Cans of beer per week     Comment:  Occasional  . Drug Use: No  . Sexual Activity: Not on file   Other Topics Concern  . Not on file   Social History Narrative   Born and raised in [place], [place]. Fun: Stay home and watch TV.    Denies religious beliefs that would effect health care.   :  REVIEW OF SYSTEMS:  A 15 point review of systems is documented in the electronic medical record. This was obtained by the nursing staff. However, I reviewed this with the patient to discuss relevant findings and make appropriate changes.  Pertinent items are noted in HPI.   PHYSICAL EXAM:  Blood pressure 101/78, pulse 92, temperature 98.8 F (37.1 C), temperature source Oral, resp. rate 15, height 5' 5.5" (1.664 m), weight 114 lb 13.8 oz (52.1 kg), SpO2 99 %.   As per Dr. Erlinda Hong on 03/18/2016:  General: Frail, significant underweight, temporal wasting , NAD  Cardiovascular: sinus tachycardia  Respiratory: CTABL  Abdomen: distended, non tender, , positive BS  Musculoskeletal: No Edema  Neuro: aaox3  KPS = 30  100 - Normal; no complaints; no evidence of disease. 90   - Able to carry on normal activity; minor signs or symptoms of disease. 80   - Normal activity with effort; some signs or symptoms of disease. 47   - Cares for self; unable to carry on normal activity or to do active  work. 60   - Requires occasional assistance, but is able to care for most of his personal needs. 50   - Requires considerable assistance and frequent medical care. 61   - Disabled; requires special care and assistance. 44   - Severely disabled; hospital admission is indicated although death not imminent. 14   - Very sick; hospital admission necessary; active supportive treatment necessary. 10   - Moribund; fatal processes progressing rapidly. 0     - Dead  Karnofsky DA, Abelmann [place], Craver LS and Burchenal [facility] [phone]) The use of the nitrogen mustards in the palliative treatment of carcinoma: with particular reference to bronchogenic carcinoma Cancer 1 634-56  LABORATORY DATA:  Lab Results  Component Value Date   WBC 9.6 03/18/2016   HGB 10.8* 03/18/2016   HCT 32.1* 03/18/2016   MCV 92.8 03/18/2016   PLT 108* 03/18/2016   Lab Results  Component Value Date   NA 127* 03/18/2016   K 3.8 03/18/2016   CL 92* 03/18/2016   CO2 26 03/18/2016   Lab Results  Component Value Date   ALT 38 03/18/2016   AST 136* 03/18/2016   ALKPHOS 492* 03/18/2016   BILITOT 6.3* 03/18/2016     RADIOGRAPHY: Dg Chest 2 View  03/14/2016  CLINICAL DATA:  55 year old male with abdominal pain EXAM: CHEST  2 VIEW COMPARISON:  Chest radiograph dated 08/04/2004 FINDINGS: There is a shallow inspiration. Focal area of opacity at the right lung base posteriorly may represent atelectasis or pneumonia. A small right pleural effusion may be present. There is no pneumothorax. The cardiac silhouette is within normal limits. No acute osseous pathology. IMPRESSION: Focal right lung base atelectasis versus infiltrate. Clinical correlation and follow-up recommended. Electronically Signed   By: Anner Crete M.D.   On: 03/14/2016 02:39   Ct Soft Tissue Neck W Contrast  03/16/2016  CLINICAL DATA:  Left piriform sinus mass lesion with airway impingement. EXAM: CT NECK WITH CONTRAST TECHNIQUE: Multidetector CT imaging of the neck  was performed using the standard protocol following the bolus administration of intravenous contrast. CONTRAST:  10mL ISOVUE-300  IOPAMIDOL (ISOVUE-300) INJECTION 61% COMPARISON:  None. FINDINGS: Pharynx and larynx: No mucosal or submucosal mass is discernible by CT, with specific attention to the piriform sinuses. Endoscopy may be more sensitive to mucosal disease. Salivary glands: Submandibular and parotid glands are normal. Thyroid: Normal Lymph nodes: No enlarged or low-density nodes on either side of the neck. Vascular: Extensive atherosclerotic disease at both carotid bifurcations. This study was not done in the form of a CT angiogram, but stenosis is estimated at 20% in the right ICA and 50% in the left ICA. Venous structures appear normal. Limited intracranial: Negative Visualized orbits: Normal Mastoids and visualized paranasal sinuses: Clear Skeleton: Ordinary cervical spondylosis Upper chest: Advanced emphysema. Areas of interstitial density in both upper lungs most likely representing scarring. Pneumonia not excluded. Small layering effusions bilaterally. IMPRESSION: No mucosal or submucosal disease is identified, with specific attention to the left piriform sinus. Endoscopy would be more sensitive for mucosal disease. No lymphadenopathy. Aortic atherosclerosis. Carotid artery atherosclerosis. 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 Emphysema. Pulmonary scarring and/or upper lobe infiltrates. Small pleural effusions. Electronically Signed   By: Nelson Chimes M.D.   On: 03/16/2016 17:51   Ct Abdomen Pelvis W Contrast  03/14/2016  CLINICAL DATA:  Left upper quadrant and periumbilical pain for 2 days. Abdominal distention. Nausea and vomiting. Elevated white cell count. Recent diagnosis of prostate cancer. EXAM: CT ABDOMEN AND PELVIS WITH CONTRAST TECHNIQUE: Multidetector CT imaging of the abdomen and pelvis was performed using the standard protocol following bolus administration of intravenous  contrast. CONTRAST:  10mL ISOVUE-300 IOPAMIDOL (ISOVUE-300) INJECTION 61% COMPARISON:  None. FINDINGS: Small bilateral pleural effusions with basilar atelectasis, greater on the left. Emphysematous changes and fibrosis in the lung bases. Coronary artery calcifications. Small esophageal hiatal hernia. Diffuse fatty infiltration of the liver. Sub cm low-attenuation lesions in the liver are too small to characterize but likely represent cysts. Recanalization of the periumbilical veins. Calcifications in the left lobe of the liver, likely dystrophic. Probable ectatic cirrhosis with enlarged lateral segment left lobe and mild nodularity to the liver contour. Spleen size is normal. Moderate amount of free fluid throughout the abdomen and pelvis, likely representing ascites. Cystic lesion in the uncinate process of the pancreas measuring 1.5 cm diameter. Suggest follow-up in 1 year with MRI. Left adrenal gland nodule measuring 1 cm diameter. This is indeterminate but statistically likely represents an adenoma. Scattered lymph nodes in the retroperitoneum are not pathologically enlarged. Diffuse calcification of abdominal aorta without aneurysm. Inferior vena cava and kidneys are unremarkable. Stomach, small bowel, and colon are not abnormally distended. No free air in the abdomen. Pelvis: Diverticula in the sigmoid colon without evidence of diverticulitis. Prostate gland is enlarged at 4.1 cm diameter. Bladder is mostly decompressed. No pelvic mass or lymphadenopathy. Appendix is normal. Degenerative changes in the spine and hips. No destructive bone lesions. IMPRESSION: Hepatic cirrhosis and fatty infiltration with recanalization of the periumbilical veins. Moderate diffuse fluid throughout the abdomen and pelvis likely due to ascites. Bilateral pleural effusions with basilar atelectasis. 1.5 cm low-attenuation lesion in the uncinate process of the pancreas. Suggest follow-up MRI in 1 year. Electronically Signed   By:  Lucienne Capers M.D.   On: 03/14/2016 05:23   US Paracentesis  03/16/2016  INDICATION: Cirrhosis, recurrent ascites. Request made for therapeutic paracentesis. EXAM: ULTRASOUND GUIDED THERAPEUTIC PARACENTESIS MEDICATIONS: None. COMPLICATIONS: None immediate. PROCEDURE: Informed written consent was obtained from the patient after a discussion of the risks, benefits and alternatives to treatment. A  timeout was performed prior to the initiation of the procedure. Initial ultrasound scanning demonstrates a moderate amount of ascites within the left lower abdominal quadrant. The left lower abdomen was prepped and draped in the usual sterile fashion. 1% lidocaine was used for local anesthesia. Following this, a Yueh catheter was introduced. An ultrasound image was saved for documentation purposes. The paracentesis was performed. The catheter was removed and a dressing was applied. The patient tolerated the procedure well without immediate post procedural complication. FINDINGS: A total of approximately 2.6 liters of yellow fluid was removed. IMPRESSION: Successful ultrasound-guided therapeutic paracentesis yielding 2.6 liters of peritoneal fluid. Read by: Rowe Robert, PA-C Electronically Signed   By: Jerilynn Mages.  Shick M.D.   On: 03/16/2016 14:58   US Paracentesis  03/14/2016  INDICATION: Cirrhosis, ascites. Request made for diagnostic and therapeutic paracentesis. EXAM: ULTRASOUND GUIDED DIAGNOSTIC AND THERAPEUTIC PARACENTESIS MEDICATIONS: None. COMPLICATIONS: None immediate. PROCEDURE: Informed written consent was obtained from the patient after a discussion of the risks, benefits and alternatives to treatment. A timeout was performed prior to the initiation of the procedure. Initial ultrasound scanning demonstrates a moderate amount of ascites within the left lower abdominal quadrant. The left lower abdomen was prepped and draped in the usual sterile fashion. 1% lidocaine was used for local anesthesia. Following this, a  Yueh catheter was introduced. An ultrasound image was saved for documentation purposes. The paracentesis was performed. The catheter was removed and a dressing was applied. The patient tolerated the procedure well without immediate post procedural complication. FINDINGS: A total of approximately 3 liters of clear, yellow fluid was removed. Samples were sent to the laboratory as requested by the clinical team. IMPRESSION: Successful ultrasound-guided diagnostic and therapeutic paracentesis yielding 3 liters of peritoneal fluid. Read by: Rowe Robert, PA-C Electronically Signed   By: Lucrezia Europe M.D.   On: 03/14/2016 16:58   US Abdomen Limited Ruq  03/14/2016  CLINICAL DATA:  History of cirrhosis.  Abnormal transaminases. EXAM: US ABDOMEN LIMITED - RIGHT UPPER QUADRANT COMPARISON:  Abdominal ultrasound 10/20/2015 FINDINGS: Gallbladder: The gallbladder is normally distended. No gallbladder stones or sludge are seen. There is thickening and edema of the gallbladder wall measuring up to 4.6 mm in cross-section. There is a tiny amount of pericholecystic fluid, however small amount of ascites is noted in perihepatic location as well. The sonographic Murphy's sign was reported as negative. Common bile duct: Diameter: Normal in diameter measuring 5 mm, where visualized. Liver: No focal lesion identified. The liver demonstrates nodular contour and diffusely increased echogenicity. There is a small amount of perihepatic ascites. IMPRESSION: Normally distended gallbladder with edematous wall measuring up to 4.6 mm. In the settings of liver cirrhosis this may represent reactive inflammatory changes, however acalculous cholecystitis cannot be excluded. Notably, the sonographic Murphy's sign was negative. Cirrhotic appearance of the liver was small volume of abdominal ascites. Electronically Signed   By: Fidela Salisbury M.D.   On: 03/14/2016 17:44      IMPRESSION: 55 yo man with likely T1 pyriform sinus lesion suspected  to be malignancy pending biopsy.  This process appears to potentially be unrelated to his current admission for ascites and reported recent prostate cancer diagnosis.    PLAN: Await biopsy.  If patient can be medically stabilized for discharge, I would proceed with additional staging work-up (PET) as outpatient and referral to head and neck cancer multidisciplinary team.   ------------------------------------------------   [name], MD [place] Director and Director of Stereotactic Radiosurgery Direct  DialHN:[id number]  [email or web address]:[phone] [email or web address]  Skype  [email or web address]      This document serves as a record of services personally performed by [name], MD. It was created on his behalf by Arlyce Harman, a trained medical scribe. The creation of this record is based on the scribe's personal observations and the provider's statements to them. This document has been checked and approved by the attending provider.

## 2016-03-18 NOTE — Progress Notes (Signed)
Physical Therapy Treatment Patient Details Name: Adam Marshall MRN: [id number] DOB: 1960-09-13 Today's Date: 03/18/2016    History of Present Illness 55 yo male admitted with liver cirrhosis, ascities, acute resp failure. Hx of ETOH abuse, HTN, gout.     PT Comments    Progressing with mobility. Continue to recommend ST rehab at [facility].   Follow Up Recommendations  SNF     Equipment Recommendations  None recommended by PT    Recommendations for Other Services       Precautions / Restrictions Precautions Precautions: Fall Restrictions Weight Bearing Restrictions: No    Mobility  Bed Mobility Overal bed mobility: Needs Assistance Bed Mobility: Supine to Sit;Sit to Supine     Supine to sit: Min guard Sit to supine: Min assist   General bed mobility comments: small amount of assist for LEs onto bed.   Transfers Overall transfer level: Needs assistance Equipment used: Rolling walker (2 wheeled) Transfers: Sit to/from Stand Sit to Stand: Min assist         General transfer comment: Assist to rise, stabilize, control descent. VCs safety, hand placement  Ambulation/Gait Ambulation/Gait assistance: Min assist Ambulation Distance (Feet): 80 Feet Assistive device: Rolling walker (2 wheeled) Gait Pattern/deviations: Step-through pattern;Decreased stride length;Trunk flexed     General Gait Details: mildly ataxic gait pattern. Assist to stabilize pt and maneuver with walker. VCs safety, distance from RW. Pt tolerated distance well.    Stairs            Wheelchair Mobility    Modified Rankin (Stroke Patients Only)       Balance                                    Cognition Arousal/Alertness: Awake/alert Behavior During Therapy: WFL for tasks assessed/performed Overall Cognitive Status: Impaired/Different from baseline           Safety/Judgement: Decreased awareness of deficits;Decreased awareness of safety          Exercises       General Comments        Pertinent Vitals/Pain Pain Assessment: No/denies pain    Home Living                      Prior Function            PT Goals (current goals can now be found in the care plan section) Progress towards PT goals: Progressing toward goals    Frequency  Min 3X/week    PT Plan Current plan remains appropriate    Co-evaluation             End of Session Equipment Utilized During Treatment: Gait belt Activity Tolerance: Patient tolerated treatment well Patient left: with call bell/phone within reach;with bed alarm set;with family/visitor present     Time: [id number] PT Time Calculation (min) (ACUTE ONLY): 15 min  Charges:  $Gait Training: 8-22 mins                    G Codes:      Weston Anna, MPT Pager: [phone]

## 2016-03-18 NOTE — Anesthesia Procedure Notes (Signed)
Procedure Name: Intubation Date/Time: 03/18/2016 7:58 AM Performed by: Lind Covert Pre-anesthesia Checklist: Patient identified, Emergency Drugs available, Suction available, Patient being monitored and Timeout performed Patient Re-evaluated:Patient Re-evaluated prior to inductionOxygen Delivery Method: Circle system utilized Preoxygenation: Pre-oxygenation with 100% oxygen Intubation Type: IV induction Ventilation: Mask ventilation without difficulty Laryngoscope Size: Miller and 4 (Dr. [name]) Grade View: Grade I Tube type: Oral Tube size: 6.0 mm Number of attempts: 1 Placement Confirmation: ETT inserted through vocal cords under direct vision,  positive ETCO2 and breath sounds checked- equal and bilateral Secured at: 22 cm Tube secured with: Tape Dental Injury: Teeth and Oropharynx as per pre-operative assessment

## 2016-03-18 NOTE — Interval H&P Note (Signed)
History and Physical Interval Note:  03/18/2016 7:40 AM  Adam Marshall  has presented today for surgery, with the diagnosis of LEFT PYRIFORM SINUS CANCER  The various methods of treatment have been discussed with the patient and family. After consideration of risks, benefits and other options for treatment, the patient has consented to  Procedure(s): PANENDOSCOPY WITH BIOPSY (N/A) as a surgical intervention .  The patient's history has been re-reviewed, patient re-examined, no change in status, stable for surgery.  I have re-reviewed the patient's chart and labs.  Questions were answered to the patient's satisfaction.     [name]

## 2016-03-18 NOTE — Op Note (Signed)
03/18/2016  8:18 AM    Adam Marshall  [id number]   Pre-Op Dx:  LEFT pyriform sinus lesion  Post-op Dx: T1N0 LEFT pyriform sinus lesion  Proc: Direct Laryngoscopy and biopsy, Bronchoscopy, Esophagoscopy   Surg:  [name] T MD  Anes:  General mask to GOT  EBL:  min  Comp:  none  Findings:  Cachectic habitus.  Sessile 10 x 20 mm lesion on medial wall LEFT pyriform sinus, not to apex, not to midline. No involvement of lateral wall.  Remainder of evaluation negative.  No palpable adenopathy.  Procedure: With the patient in a comfortable supine position, general mask anesthesia was induced without difficulty.  At an appropriate level, the table was turned 90 degrees away from Anesthesia.  A clean preparation and draping was performed in the standard fashion.  A rubber tooth guard was placed.   Using the [facility] laryngoscope, the larynx was visualized.  The rod bronchoscope was passed into the trachea and inspection down into the mainstem bronchus on each side was performed with the findings as described above.  The bronchoscope and laryngoscope were removed.  The cervical esophagoscope was lubricated and inserted into the hypopharynx.  With gentle pressure, it was passed through the cricopharyngeus and advanced to its full length without difficulty with the findings as described above.  It was removed.   The anterior commissure laryngoscope was introduced taking care to protect lips, teeth, and endotracheal tube.  Complete laryngoscopy was performed in the standard fashion.  The findings were as described above. Biopsies were taken from the LEFT pyriform sinus lesion.   The laryngoscope was removed.  The oropharynx, oral cavity, nasopharynx and hypopharynx were palpated with findings as described above.  The neck was palpated on both sides with the findings as described above.  At this point the procedure was completed. The rubber tooth guard was removed.   Dental status  was intact.  The patient was returned to Anesthesia, awakened, extubated, and transferred to PACU in satisfactory condition.   Dispo:   PACU to floor  Plan:  Overall care per IM.  Will await the path report. Consult Radiation Oncology.  Will put patient on for tumor board.  OK for hospital discharge from my standpoint at any time.   Tyson Alias MD

## 2016-03-18 NOTE — Discharge Instructions (Signed)
I will call with the pathology report Recheck my office 2 weeks please

## 2016-03-18 NOTE — Transfer of Care (Signed)
Immediate Anesthesia Transfer of Care Note  Patient: Adam Marshall  Procedure(s) Performed: Procedure(s): DIRECT LARYNGOSCOPY, WITH BRONCHOSCOPY, AND ESOPHAGOSCOPY, WITH BIOPSY (N/A)  Patient Location: PACU  Anesthesia Type:General  Level of Consciousness: sedated  Airway & Oxygen Therapy: Patient Spontanous Breathing and Patient connected to face mask oxygen  Post-op Assessment: Report given to RN and Post -op Vital signs reviewed and stable  Post vital signs: Reviewed and stable  Last Vitals:  Filed Vitals:   03/18/16 0327 03/18/16 0532  BP: 114/81 114/82  Pulse: 96 97  Temp: 36.7 C 37.2 C  Resp:  14    Last Pain:  Filed Vitals:   03/18/16 0533  PainSc: Asleep      Patients Stated Pain Goal: 3 ([id number])  Complications: No apparent anesthesia complications

## 2016-03-18 NOTE — Progress Notes (Signed)
CSW assisting with d/c planning. [place] has received authorization from [facility] for SNF placement. Met with pt / son at bedside to provide update. Pt just returned from procedure. " I don't feel ready to go anywhere today. " Support provided.  Werner Lean LCSW [phone]

## 2016-03-18 NOTE — Progress Notes (Signed)
PROGRESS NOTE  Adam Marshall [id number] DOB: December 29, 1960 DOA: 03/14/2016 PCP: [name], FNP  HPI/Recap of past 24 hours:   s/p biopsy this am,  C/o chronic back pain after walking with physical therapy this pm Otherwise stable Fiance in room  Assessment/Plan: Principal Problem:   Cirrhosis of liver with ascites ([place]) Active Problems:   HYPERTENSION, BENIGN ESSENTIAL   Alcohol use (HCC)   Liver failure (HCC)   Anemia   Respiratory failure with hypoxia (HCC)   Lesion of pancreas   Generalized abdominal pain   Hyponatremia   Melena   Ascites   Abnormal transaminases   Normochromic normocytic anemia   Pyriform sinus mass   Jaundice   Reflux esophagitis   Pancreatic lesion   1-Acute hypoxic Respiratory Failure:  This could be multifactorial, might have underline emphysema,  anemia, ?PNA, Abdominal distension.  He was admitted to step down initially S/p 2 units PRBC. s/p Paracentesis.  continue abx to finish 7 day treatment, add incentive spirometer now on room air   2-Cirrhosis Liver/ascites; In setting of alcohol, fatty liver.  paracentesisx2 for ascites.  Albuminx2, No ab pain GI input appreciated,   3-Hyponatremia; could be secondary to cirrhosis and or dehydration.  Would Avoid IV fluids due to significant ascites. Might be able to give IV fluids after paracentesis.  Clinically dry, though does has ascites  4-Anemia:  hgb 7 on admission, Last hb per records 5 months ago was at 11.  B12/folate/iron panel unremarkable/tsh wnl S/p prbc transfusionx2 units on 7/10 EGd with LA grad D reflux esophagitis, and nonbleeding gastric ulcer,  ppi bid for 8 weeks per GI recommendation    5-PNA; He report productive cough, chest x ray with possible infiltrates. Leukocytosis.  continue Levaquin.   6-Alcohol use: High risk for withdrawal.  CIWA protocol ordered.  No overt sign of withdrawal symptoms  7-Hypokalemia/hypomagnesemia;  Replace prn  to keep k>4, mag>2   8. Left pyriform sinus mass:  Concerning for malignancy, with h/o alcohol/smoking and significant weight loss ENT consult, Dr [name] input appreciated, s/p biopsy, pathology result pending, Dr [name] consulted radiation oncology.  9. Sinus tachycardia, start low dose lopressor with holding parameter, improving  10 FTT, weakness, Severe malnutrition in context of chronic illness  Body mass index is 18.82 kg/(m^2).  HIV negative, Nutrition consulted, Physical therapy, SNF placement   DVT prophylaxis: SCD Code Status: Full code.  Family Communication: care discussed with patient , his son and brother in room on 7/13 and his fiance in room on 7/14 Disposition Plan: pending radiation oncology recommendation, SNF placement at discharge Consults called: GI/ENT/radiation oncology   Procedures: Paracentesis 3liters removed on 7/10, 2.6liters removed on 7/12 egd 7/11 prbc  Transfusion x2 on 7/10 Direct Laryngoscopy and biopsy, Bronchoscopy, Esophagoscopy on 7/14  Antibiotics:  levaquin   Objective: BP 107/82 mmHg  Pulse 103  Temp(Src) 98.9 F (37.2 C) (Oral)  Resp 16  Ht 5' 5.5" (1.664 m)  Wt 52.1 kg (114 lb 13.8 oz)  BMI 18.82 kg/m2  SpO2 100%  Intake/Output Summary (Last 24 hours) at 03/18/16 1037 Last data filed at 03/18/16 0915  Gross per 24 hour  Intake    800 ml  Output    850 ml  Net    -50 ml   Filed Weights   03/14/16 0741 03/14/16 0856 03/15/16 0000  Weight: 49.896 kg (110 lb) 54.3 kg (119 lb 11.4 oz) 52.1 kg (114 lb 13.8 oz)    Exam:   General:  Frail, significant underweight, temporal wasting , NAD  Cardiovascular: sinus tachycardia improving  Respiratory: CTABL  Abdomen: distended, non tender,  positive BS  Musculoskeletal: No Edema  Neuro: aaox3  Data Reviewed: Basic Metabolic Panel:  Recent Labs Lab 03/14/16 0222 03/14/16 0736 03/15/16 0305 03/16/16 0432 03/17/16 0424 03/18/16 0441  NA 125*  --  126* 128* 128*  127*  K 3.4*  --  3.4* 3.4* 3.3* 3.8  CL 85*  --  89* 92* 92* 92*  CO2 24  --  27 25 25 26   GLUCOSE 116*  --  98 98 96 94  BUN 12  --  18 14 13 14   CREATININE 0.62  --  0.68 0.63 0.69 0.73  CALCIUM 8.5*  --  8.4* 8.0* 8.0* 8.1*  MG  --  1.4*  --   --  1.1* 1.5*   Liver Function Tests:  Recent Labs Lab 03/14/16 0222 03/15/16 0305 03/17/16 0424 03/18/16 0441  AST 138* 99* 145* 136*  ALT 28 22 30  38  ALKPHOS 655* 458* 431* 492*  BILITOT 2.4* 5.8* 5.0* 6.3*  PROT 6.6 5.9* 5.6* 5.5*  ALBUMIN 3.0* 2.8* 2.7* 2.6*    Recent Labs Lab 03/14/16 0222  LIPASE 47   No results for input(s): AMMONIA in the last 168 hours. CBC:  Recent Labs Lab 03/14/16 0222 03/15/16 0305 03/15/16 1119 03/16/16 0432 03/17/16 0424 03/18/16 0441  WBC 13.9* 10.1  --  10.6* 10.3 9.6  NEUTROABS 11.6*  --   --  9.0*  --   --   HGB 7.1* 11.1* 11.6* 11.0* 10.5* 10.8*  HCT 21.1* 31.9*  --  32.2* 31.4* 32.1*  MCV 95.0 90.1  --  91.7 92.9 92.8  PLT 190 122*  --  116* 113* 108*   Cardiac Enzymes:   No results for input(s): CKTOTAL, CKMB, CKMBINDEX, TROPONINI in the last 168 hours. BNP (last 3 results) No results for input(s): BNP in the last 8760 hours.  ProBNP (last 3 results) No results for input(s): PROBNP in the last 8760 hours.  CBG:  Recent Labs Lab 03/15/16 1117  GLUCAP 73    Recent Results (from the past 240 hour(s))  MRSA PCR Screening     Status: None   Collection Time: 03/14/16  9:00 AM  Result Value Ref Range Status   MRSA by PCR NEGATIVE NEGATIVE Final    Comment:        The GeneXpert MRSA Assay (FDA approved for NASAL specimens only), is one component of a comprehensive MRSA colonization surveillance program. It is not intended to diagnose MRSA infection nor to guide or monitor treatment for MRSA infections.   Culture, blood (routine x 2) Call MD if unable to obtain prior to antibiotics being given     Status: None (Preliminary result)   Collection Time: 03/14/16   9:13 AM  Result Value Ref Range Status   Specimen Description BLOOD BLOOD RIGHT HAND  Final   Special Requests IN PEDIATRIC BOTTLE 2CC  Final   Culture   Final    NO GROWTH 3 DAYS Performed at [facility]    Report Status PENDING  Incomplete  Culture, blood (routine x 2) Call MD if unable to obtain prior to antibiotics being given     Status: None (Preliminary result)   Collection Time: 03/14/16  9:13 AM  Result Value Ref Range Status   Specimen Description BLOOD RIGHT ARM  Final   Special Requests BOTTLES DRAWN AEROBIC AND ANAEROBIC 5CC  Final  Culture   Final    NO GROWTH 3 DAYS Performed at [facility]    Report Status PENDING  Incomplete  Culture, body fluid-bottle     Status: None (Preliminary result)   Collection Time: 03/14/16  5:28 PM  Result Value Ref Range Status   Specimen Description FLUID ASCITIC  Final   Special Requests BOTTLES DRAWN AEROBIC AND ANAEROBIC 10CC  Final   Culture   Final    NO GROWTH 3 DAYS Performed at [facility]    Report Status PENDING  Incomplete  Gram stain     Status: None   Collection Time: 03/14/16  5:28 PM  Result Value Ref Range Status   Specimen Description FLUID ASCITIC  Final   Special Requests NONE  Final   Gram Stain   Final    FEW WBC PRESENT, PREDOMINANTLY MONONUCLEAR NO ORGANISMS SEEN Performed at [facility]    Report Status 03/14/2016 FINAL  Final  Culture, sputum-assessment     Status: None   Collection Time: 03/15/16 12:58 AM  Result Value Ref Range Status   Specimen Description SPUTUM  Final   Special Requests NONE  Final   Sputum evaluation   Final    MICROSCOPIC FINDINGS SUGGEST THAT THIS SPECIMEN IS NOT REPRESENTATIVE OF LOWER RESPIRATORY SECRETIONS. PLEASE RECOLLECT. SPOKE TO H.PENG RN [phone] [id number] [email or web address]    Report Status 03/15/2016 FINAL  Final     Studies: No results found.  Scheduled Meds: . allopurinol  100 mg Oral Daily  . feeding supplement (ENSURE ENLIVE)  237 mL  Oral TID BM  . fluticasone  1 spray Each Nare Daily  . folic acid  1 mg Oral Daily  . furosemide  20 mg Intravenous Once  . furosemide  40 mg Oral Daily  . levofloxacin (LEVAQUIN) IV  750 mg Intravenous Q24H  . lidocaine  15 mL Mouth/Throat Once  . magnesium sulfate 1 - 4 g bolus IVPB  4 g Intravenous Once  . metoprolol tartrate  12.5 mg Oral BID  . mupirocin ointment   Nasal BID  . nicotine  14 mg Transdermal Daily  . pantoprazole  40 mg Oral BID AC  . polyvinyl alcohol  1 drop Both Eyes BID  . potassium chloride  10 mEq Intravenous Once  . sodium chloride flush  3 mL Intravenous Q12H  . sodium chloride flush  3 mL Intravenous Q12H  . thiamine  100 mg Oral Daily    Continuous Infusions:    Time spent: 74mins  Jordyne Poehlman MD, PhD  [facility] Pager [phone]. If 7PM-7AM, please contact night-coverage at [email or web address], password [email or web address] 03/18/2016, 10:37 AM  LOS: 4 days

## 2016-03-19 DIAGNOSIS — K210 Gastro-esophageal reflux disease with esophagitis: Secondary | ICD-10-CM

## 2016-03-19 LAB — BASIC METABOLIC PANEL
ANION GAP: 9 (ref 5–15)
BUN: 14 mg/dL (ref 6–20)
BUN: 14 mg/dL (ref 4–21)
CALCIUM: 8.1 mg/dL — AB (ref 8.9–10.3)
CO2: 25 mmol/L (ref 22–32)
CREATININE: 0.7 mg/dL (ref 0.6–1.3)
Chloride: 91 mmol/L — ABNORMAL LOW (ref 101–111)
Creatinine, Ser: 0.69 mg/dL (ref 0.61–1.24)
GLUCOSE: 97 mg/dL
Glucose, Bld: 97 mg/dL (ref 65–99)
POTASSIUM: 3.9 mmol/L (ref 3.5–5.1)
SODIUM: 125 mmol/L — AB (ref 137–147)
SODIUM: 125 mmol/L — AB (ref 135–145)

## 2016-03-19 LAB — CULTURE, BODY FLUID-BOTTLE: CULTURE: NO GROWTH

## 2016-03-19 LAB — CULTURE, BLOOD (ROUTINE X 2)
CULTURE: NO GROWTH
CULTURE: NO GROWTH

## 2016-03-19 LAB — CBC AND DIFFERENTIAL: WBC: 9.5 10^3/mL

## 2016-03-19 LAB — CULTURE, BODY FLUID W GRAM STAIN -BOTTLE

## 2016-03-19 LAB — CBC
HCT: 32.5 % — ABNORMAL LOW (ref 39.0–52.0)
Hemoglobin: 10.5 g/dL — ABNORMAL LOW (ref 13.0–17.0)
MCH: 30.8 pg (ref 26.0–34.0)
MCHC: 32.3 g/dL (ref 30.0–36.0)
MCV: 95.3 fL (ref 78.0–100.0)
PLATELETS: 127 10*3/uL — AB (ref 150–400)
RBC: 3.41 MIL/uL — AB (ref 4.22–5.81)
RDW: 15.9 % — AB (ref 11.5–15.5)
WBC: 9.5 10*3/uL (ref 4.0–10.5)

## 2016-03-19 LAB — MAGNESIUM: MAGNESIUM: 1.8 mg/dL (ref 1.7–2.4)

## 2016-03-19 MED ORDER — SPIRONOLACTONE 25 MG PO TABS: 25.0000 mg | ORAL_TABLET | Freq: Every day | ORAL | Status: DC

## 2016-03-19 MED ORDER — ENSURE ENLIVE PO LIQD: 237.0000 mL | Freq: Three times a day (TID) | ORAL | Status: AC

## 2016-03-19 MED ORDER — GUAIFENESIN ER 600 MG PO TB12
600.0000 mg | ORAL_TABLET | Freq: Two times a day (BID) | ORAL | Status: DC
  Administered 2016-03-19: 600 mg via ORAL
  Filled 2016-03-19: qty 1

## 2016-03-19 MED ORDER — PANTOPRAZOLE SODIUM 40 MG PO TBEC: 40.0000 mg | DELAYED_RELEASE_TABLET | Freq: Two times a day (BID) | ORAL | Status: DC

## 2016-03-19 MED ORDER — GUAIFENESIN ER 600 MG PO TB12: 600.0000 mg | ORAL_TABLET | Freq: Two times a day (BID) | ORAL | Status: DC

## 2016-03-19 MED ORDER — LEVOFLOXACIN 750 MG PO TABS: 750.0000 mg | ORAL_TABLET | Freq: Every day | ORAL | Status: DC

## 2016-03-19 MED ORDER — LACTULOSE 10 G PO PACK: 10.0000 g | PACK | Freq: Every day | ORAL | Status: DC

## 2016-03-19 MED ORDER — FUROSEMIDE 40 MG PO TABS: 40.0000 mg | ORAL_TABLET | Freq: Every day | ORAL | Status: DC

## 2016-03-19 MED ORDER — PROPRANOLOL HCL 10 MG PO TABS: 10.0000 mg | ORAL_TABLET | Freq: Two times a day (BID) | ORAL | Status: DC

## 2016-03-19 MED ORDER — POLYETHYLENE GLYCOL 3350 17 G PO PACK
17.0000 g | PACK | Freq: Every day | ORAL | Status: DC
  Administered 2016-03-19: 17 g via ORAL
  Filled 2016-03-19: qty 1

## 2016-03-19 MED ORDER — ALBUTEROL SULFATE (2.5 MG/3ML) 0.083% IN NEBU: 2.5000 mg | INHALATION_SOLUTION | Freq: Four times a day (QID) | RESPIRATORY_TRACT | Status: AC | PRN

## 2016-03-19 MED ORDER — SALINE SPRAY 0.65 % NA SOLN: 2.0000 | Freq: Two times a day (BID) | NASAL | Status: DC

## 2016-03-19 NOTE — Clinical Social Work Placement (Signed)
   CLINICAL SOCIAL WORK PLACEMENT  NOTE  Date:  03/19/2016  Patient Details  Name: Adam Marshall MRN: [id number] Date of Birth: 12/28/1960  Clinical Social Work is seeking post-discharge placement for this patient at the [place] level of care (*CSW will initial, date and re-position this form in  chart as items are completed):  Yes   Patient/family provided with [place] Work Department's list of facilities offering this level of care within the geographic area requested by the patient (or if unable, by the patient's family).  Yes   Patient/family informed of their freedom to choose among providers that offer the needed level of care, that participate in Medicare, Medicaid or managed care program needed by the patient, have an available bed and are willing to accept the patient.  Yes   Patient/family informed of [facility]'s ownership interest in [facility] and [facility], as well as of the fact that they are under no obligation to receive care at these facilities.  PASRR submitted to EDS on 03/17/16     PASRR number received on 03/17/16     Existing PASRR number confirmed on       FL2 transmitted to all facilities in geographic area requested by pt/family on 03/17/16     FL2 transmitted to all facilities within larger geographic area on       Patient informed that his/her managed care company has contracts with or will negotiate with certain facilities, including the following:        Yes   Patient/family informed of bed offers received.  Patient chooses bed at [facility]     Physician recommends and patient chooses bed at      Patient to be transferred to [facility] on 03/19/16.  Patient to be transferred to facility by [facility]     Patient family notified on 03/19/16 of transfer.  Name of family member notified:  SON     PHYSICIAN       Additional Comment: Pt / son are in agreement with  d/c to [place] today. [place] medicare has provided authorization for placement. D/C Summary has been sent to SNF for review. [facility] transport is needed. Medical necessity form completed. Son is aware out of pocket costs may be associated with [facility] transport. # for report provided to nsg.   _______________________________________________ Luretha Rued, LCSW 03/19/2016, 11:32 AM

## 2016-03-19 NOTE — Progress Notes (Signed)
Report given to Cherie Ouch, Therapist, sports at [facility] facility regarding pt status. Nurse aware [facility] notified by SW and currently waiting arrival to transfer pt to facility.

## 2016-03-19 NOTE — Progress Notes (Signed)
Pt has a ST Rehab bed at [facility] today if stable for d/c. CSW will assist with d/c planning.  Werner Lean LCSW [phone]

## 2016-03-19 NOTE — Discharge Summary (Addendum)
Discharge Summary  Adam Marshall:[id number] DOB: [birth year]  PCP: [name], FNP  Admit date: 03/14/2016 Discharge date: 03/19/2016  Time spent: >62mns  Recommendations for Outpatient Follow-up:  [facility]. [facility] repeat cbc/cmp at follow up, repeat follow up cxr in 3-4 weeks 2. F/u with radiation oncology Dr mTammi Klippel3. F/u with ENT Dr WErik Obey4. F/u with [facility] for cirrhosis/ascites   Discharge Diagnoses:  Active Hospital Problems   Diagnosis Date Noted  . Cirrhosis of liver with ascites ([place] 03/14/2016  . Jaundice   . Reflux esophagitis   . Pancreatic lesion   . Abnormal transaminases   . Normochromic normocytic anemia   . Pyriform sinus mass   . Melena   . Ascites   . Liver failure ([place] 03/14/2016  . Anemia 03/14/2016  . Respiratory failure with hypoxia ([place] 03/14/2016  . Lesion of pancreas   . Generalized abdominal pain   . Hyponatremia   . Alcohol use ([place] 10/13/2015  . HYPERTENSION, BENIGN ESSENTIAL 06/28/2010    Resolved Hospital Problems   Diagnosis Date Noted Date Resolved  No resolved problems to display.    Discharge Condition: stable  Diet recommendation: heart healthy, sodium restricted diet  Filed Weights   03/14/16 0741 03/14/16 0856 03/15/16 0000  Weight: 49.896 kg (110 lb) 54.3 kg (119 lb 11.4 oz) 52.1 kg (114 lb 13.8 oz)    History of present illness:  Chief Complaint: Abdominal distension.   HPI: Adam EUGENEis a 55y.o. male with medical history significant of Fatty liver, alcohol use, HTN, Gout who presents complain of abdominal distension that started 2 weeks ago. He relates that distension got worse day prior to admission. He also report having to use pampers because he is not able to control his bowel movement. He denies abdominal pain. He does relates SOB, cough, nausea and vomiting. He was found to be hypoxic in the ED. He is requiring 4 L to keep oxygen sat above 92. He  denies fevers.   ED Course: Presents with abdominal distension, found to have cirrhosis of liver, current alcohol use, : Sodium 125, k 3.4, chloride 85, alk phosphatase, AST 138, bili 2.4, hb 7.1, WBC 13, INR 1.1, occult blood , CT abdomen pelvis: Hepatic cirrhosis and fatty infiltration with recanalization of the periumbilical veins. Moderate diffuse fluid throughout the abdomen and pelvis likely due to ascites. Bilateral pleural effusions with basilar atelectasis. 1.5 cm low-attenuation lesion in the uncinate process of the pancreas. Suggest follow-up MRI in 1 year. Chest x- ray: Focal right lung base atelectasis versus infiltrate.   Hospital Course:  Principal Problem:   Cirrhosis of liver with ascites (HCC) Active Problems:   HYPERTENSION, BENIGN ESSENTIAL   Alcohol use (HCC)   Liver failure (HCC)   Anemia   Respiratory failure with hypoxia (HCC)   Lesion of pancreas   Generalized abdominal pain   Hyponatremia   Melena   Ascites   Abnormal transaminases   Normochromic normocytic anemia   Pyriform sinus mass   Jaundice   Reflux esophagitis   Pancreatic lesion    1-Acute hypoxic Respiratory Failure:  This could be multifactorial, might have underline emphysema, anemia, ?PNA, Abdominal distension.  He was admitted to step down initially S/p 2 units PRBC. s/p Paracentesis.  continue abx to finish 7 day treatment, add incentive spirometer, mucinex Repeat cxr two view in 3-4 weeks after discharge  2-Cirrhosis Liver/ascites; In setting of alcohol, fatty liver.  paracentesisx2  for ascites. Albuminx2, No ab pain Started lasix/spironolectone/propranolol at discharge , continue low sodium diet GI input appreciated  3-Hyponatremia; asymptomatic, no confusion, hyponatremia could be secondary to cirrhosis and or dehydration.  Would Avoid IV fluids due to significant ascites.   Clinically dry, though does has ascites   4-chronic blood loss Anemia:  hgb 7 on admission, Last  hb per records 5 months ago was at 11.  B12/folate/iron panel unremarkable/tsh wnl S/p prbc transfusionx2 units on 7/10 EGd with LA grad D reflux esophagitis, and nonbleeding gastric ulcer,  ppi bid for 8 weeks per GI recommendation   5-PNA; He report productive cough, chest x ray with possible infiltrates. Leukocytosis.  continue Levaquin. Repeat cxr in 3-4 weeks   6-Alcohol use: High risk for withdrawal.  CIWA protocol ordered.  No overt sign of withdrawal symptoms  7-Hypokalemia/hypomagnesemia;  Replace prn to keep k>4, mag>2   8. 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  9. Sinus tachycardia, resolved with  low dose lopressor, discharged on propranolol   10 FTT, weakness, Severe malnutrition in context of chronic illness Body mass index is 18.82 kg/(m^2).  HIV negative, Nutrition consulted, Physical therapy, SNF placement  11:  1.5 cm low-attenuation lesion in the uncinate process of the pancreas. Suggest follow-up MRI in 1 year.   DVT prophylaxis: SCD Code Status: Full code.  Family Communication: care discussed with patient , his son and brother in room on 7/13 and his fiance in room on 7/14 Disposition Plan:  SNF placement at discharge Consults called: GI/ENT/radiation oncology   Procedures: Paracentesis 3liters removed on 7/10, 2.6liters removed on 7/12 egd 7/11 prbc Transfusion x2 on 7/10 Direct Laryngoscopy and biopsy, Bronchoscopy, Esophagoscopy on 7/14  Antibiotics:  levaquin   Discharge Exam: BP 113/76 mmHg  Pulse 99  Temp(Src) 98.3 F (36.8 C) (Oral)  Resp 16  Ht 5' 5.5" (1.664 m)  Wt 52.1 kg (114 lb 13.8 oz)  BMI 18.82 kg/m2  SpO2 100%     General: Frail, significant underweight, temporal wasting , NAD  Cardiovascular: sinus tachycardia  resolved  Respiratory: CTABL  Abdomen: distended, non tender, positive BS  Musculoskeletal: No Edema  Neuro: aaox3   Discharge Instructions You were cared for by a hospitalist during your hospital stay. If you have any questions about your discharge medications or the care you received while you were in the hospital after you are discharged, you can call the unit and asked to speak with the hospitalist on call if the hospitalist that took care of you is not available. Once you are discharged, your primary care physician will handle any further medical issues. Please note that NO REFILLS for any discharge medications will be authorized once you are discharged, as it is imperative that you return to your primary care physician (or establish a relationship with a primary care physician if you do not have one) for your aftercare needs so that they can reassess your need for medications and monitor your lab values.      Discharge Instructions    Diet - low sodium heart healthy    Complete by:  As directed      Increase activity slowly    Complete by:  As directed             Medication List    STOP taking these medications  amLODipine 10 MG tablet  Commonly known as:  NORVASC     carvedilol 12.5 MG tablet  Commonly known as:  COREG      TAKE these medications        albuterol (2.5 MG/3ML) 0.083% nebulizer solution  Commonly known as:  PROVENTIL  Take 3 mLs (2.5 mg total) by nebulization every 6 (six) hours as needed for wheezing or shortness of breath.     allopurinol 100 MG tablet  Commonly known as:  ZYLOPRIM  Take 1 tablet (100 mg total) by mouth daily.     aspirin 81 MG chewable tablet  Chew 81 mg by mouth daily.     colchicine 0.6 MG tablet  Take 1 tablet (0.6 mg total) by mouth daily.     cyclobenzaprine 5 MG tablet  Commonly known as:  FLEXERIL  Take 1 tablet (5 mg total) by mouth 3 (three) times daily as needed for muscle spasms.     feeding  supplement (ENSURE ENLIVE) Liqd  Take 237 mLs by mouth 3 (three) times daily between meals.     fluticasone 50 MCG/ACT nasal spray  Commonly known as:  FLONASE  Place 1 spray into the nose daily.     folic acid 1 MG tablet  Commonly known as:  FOLVITE  Take 1 tablet (1 mg total) by mouth daily.     furosemide 40 MG tablet  Commonly known as:  LASIX  Take 1 tablet (40 mg total) by mouth daily.     guaiFENesin 600 MG 12 hr tablet  Commonly known as:  MUCINEX  Take 1 tablet (600 mg total) by mouth 2 (two) times daily.     lactulose 10 g packet  Commonly known as:  CEPHULAC  Take 1 packet (10 g total) by mouth daily.     levofloxacin 750 MG tablet  Commonly known as:  LEVAQUIN  Take 1 tablet (750 mg total) by mouth daily.     meloxicam 15 MG tablet  Commonly known as:  MOBIC  Take 1 tablet (15 mg total) by mouth daily as needed for pain.     mupirocin cream 2 %  Commonly known as:  BACTROBAN  Apply 1 application topically 2 (two) times daily.     pantoprazole 40 MG tablet  Commonly known as:  PROTONIX  Take 1 tablet (40 mg total) by mouth 2 (two) times daily before a meal.     propranolol 10 MG tablet  Commonly known as:  INDERAL  Take 1 tablet (10 mg total) by mouth 2 (two) times daily.     sodium chloride 0.65 % Soln nasal spray  Commonly known as:  OCEAN  Place 2 sprays into both nostrils 2 (two) times daily.     spironolactone 25 MG tablet  Commonly known as:  ALDACTONE  Take 1 tablet (25 mg total) by mouth daily.     thiamine 100 MG tablet  Commonly known as:  VITAMIN B-1  Take 1 tablet (100 mg total) by mouth daily.     XIIDRA 5 % Soln  Generic drug:  Lifitegrast  Apply 1 drop to eye 2 (two) times daily.       Allergies  Allergen Reactions  . Penicillins Hives and Rash    Has patient had a PCN reaction causing immediate rash, facial/tongue/throat swelling, SOB or lightheadedness with hypotension:Yes Has patient had a PCN reaction causing severe rash  involving mucus membranes or skin necrosis:unsure Has patient had a PCN reaction that required  hospitalization:No Has patient had a PCN reaction occurring within the last 10 years:No  If all of the above answers are "NO", then may proceed with Cephalosporin use.    Follow-up Information    Follow up with [name], MD. Schedule an appointment as soon as possible for a visit in 2 weeks.   Specialty:  Otolaryngology   Contact information:   [name] [place] [address] [phone]       Follow up with [facility] .   Specialty:  Skilled Nursing Facility   Contact information:   [address] [place] [phone]      Follow up with [name], MD.   Specialty:  Radiation Oncology   Contact information:   [place] [address] [phone]       Follow up with Doran Stabler, MD.   Specialty:  Gastroenterology   Why:  for cirrhosis/ascites    Contact information:   [address] [place]  [address] [phone]        The results of significant diagnostics from this hospitalization (including imaging, microbiology, ancillary and laboratory) are listed below for reference.    Significant Diagnostic Studies: Dg Chest 2 View  03/14/2016  CLINICAL DATA:  54 year old male with abdominal pain EXAM: CHEST  2 VIEW COMPARISON:  Chest radiograph dated 08/04/2004 FINDINGS: There is a shallow inspiration. Focal area of opacity at the right lung base posteriorly may represent atelectasis or pneumonia. A small right pleural effusion may be present. There is no pneumothorax. The cardiac silhouette is within normal limits. No acute osseous pathology. IMPRESSION: Focal right lung base atelectasis versus infiltrate. Clinical correlation and follow-up recommended. Electronically Signed   By: Anner Crete M.D.   On: 03/14/2016 02:39   Ct Soft Tissue Neck W  Contrast  03/16/2016  CLINICAL DATA:  Left piriform sinus mass lesion with airway impingement. EXAM: CT NECK WITH CONTRAST TECHNIQUE: Multidetector CT imaging of the neck was performed using the standard protocol following the bolus administration of intravenous contrast. CONTRAST:  68m ISOVUE-300 IOPAMIDOL (ISOVUE-300) INJECTION 61% COMPARISON:  None. FINDINGS: Pharynx and larynx: No mucosal or submucosal mass is discernible by CT, with specific attention to the piriform sinuses. Endoscopy may be more sensitive to mucosal disease. Salivary glands: Submandibular and parotid glands are normal. Thyroid: Normal Lymph nodes: No enlarged or low-density nodes on either side of the neck. Vascular: Extensive atherosclerotic disease at both carotid bifurcations. This study was not done in the form of a CT angiogram, but stenosis is estimated at 20% in the right ICA and 50% in the left ICA. Venous structures appear normal. Limited intracranial: Negative Visualized orbits: Normal Mastoids and visualized paranasal sinuses: Clear Skeleton: Ordinary cervical spondylosis Upper chest: Advanced emphysema. Areas of interstitial density in both upper lungs most likely representing scarring. Pneumonia not excluded. Small layering effusions bilaterally. IMPRESSION: No mucosal or submucosal disease is identified, with specific attention to the left piriform sinus. Endoscopy would be more sensitive for mucosal disease. No lymphadenopathy. Aortic atherosclerosis. Carotid artery atherosclerosis. 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 Emphysema. Pulmonary scarring and/or upper lobe infiltrates. Small pleural effusions. Electronically Signed   By: MNelson ChimesM.D.   On: 03/16/2016 17:51   Ct Abdomen Pelvis W Contrast  03/14/2016  CLINICAL DATA:  Left upper quadrant and periumbilical pain for 2 days. Abdominal distention. Nausea and vomiting. Elevated white cell count. Recent  diagnosis of prostate cancer. EXAM: CT ABDOMEN AND  PELVIS WITH CONTRAST TECHNIQUE: Multidetector CT imaging of the abdomen and pelvis was performed using the standard protocol following bolus administration of intravenous contrast. CONTRAST:  46m ISOVUE-300 IOPAMIDOL (ISOVUE-300) INJECTION 61% COMPARISON:  None. FINDINGS: Small bilateral pleural effusions with basilar atelectasis, greater on the left. Emphysematous changes and fibrosis in the lung bases. Coronary artery calcifications. Small esophageal hiatal hernia. Diffuse fatty infiltration of the liver. Sub cm low-attenuation lesions in the liver are too small to characterize but likely represent cysts. Recanalization of the periumbilical veins. Calcifications in the left lobe of the liver, likely dystrophic. Probable ectatic cirrhosis with enlarged lateral segment left lobe and mild nodularity to the liver contour. Spleen size is normal. Moderate amount of free fluid throughout the abdomen and pelvis, likely representing ascites. Cystic lesion in the uncinate process of the pancreas measuring 1.5 cm diameter. Suggest follow-up in 1 year with MRI. Left adrenal gland nodule measuring 1 cm diameter. This is indeterminate but statistically likely represents an adenoma. Scattered lymph nodes in the retroperitoneum are not pathologically enlarged. Diffuse calcification of abdominal aorta without aneurysm. Inferior vena cava and kidneys are unremarkable. Stomach, small bowel, and colon are not abnormally distended. No free air in the abdomen. Pelvis: Diverticula in the sigmoid colon without evidence of diverticulitis. Prostate gland is enlarged at 4.1 cm diameter. Bladder is mostly decompressed. No pelvic mass or lymphadenopathy. Appendix is normal. Degenerative changes in the spine and hips. No destructive bone lesions. IMPRESSION: Hepatic cirrhosis and fatty infiltration with recanalization of the periumbilical veins. Moderate diffuse fluid throughout the abdomen and pelvis likely due to ascites. Bilateral pleural  effusions with basilar atelectasis. 1.5 cm low-attenuation lesion in the uncinate process of the pancreas. Suggest follow-up MRI in 1 year. Electronically Signed   By: WLucienne CapersM.D.   On: 03/14/2016 05:23   UKoreaParacentesis  03/16/2016  INDICATION: Cirrhosis, recurrent ascites. Request made for therapeutic paracentesis. EXAM: ULTRASOUND GUIDED THERAPEUTIC PARACENTESIS MEDICATIONS: None. COMPLICATIONS: None immediate. PROCEDURE: Informed written consent was obtained from the patient after a discussion of the risks, benefits and alternatives to treatment. A timeout was performed prior to the initiation of the procedure. Initial ultrasound scanning demonstrates a moderate amount of ascites within the left lower abdominal quadrant. The left lower abdomen was prepped and draped in the usual sterile fashion. 1% lidocaine was used for local anesthesia. Following this, a Yueh catheter was introduced. An ultrasound image was saved for documentation purposes. The paracentesis was performed. The catheter was removed and a dressing was applied. The patient tolerated the procedure well without immediate post procedural complication. FINDINGS: A total of approximately 2.6 liters of yellow fluid was removed. IMPRESSION: Successful ultrasound-guided therapeutic paracentesis yielding 2.6 liters of peritoneal fluid. Read by: KRowe Robert PA-C Electronically Signed   By: MJerilynn Mages  Shick M.D.   On: 03/16/2016 14:58   UKoreaParacentesis  03/14/2016  INDICATION: Cirrhosis, ascites. Request made for diagnostic and therapeutic paracentesis. EXAM: ULTRASOUND GUIDED DIAGNOSTIC AND THERAPEUTIC PARACENTESIS MEDICATIONS: None. COMPLICATIONS: None immediate. PROCEDURE: Informed written consent was obtained from the patient after a discussion of the risks, benefits and alternatives to treatment. A timeout was performed prior to the initiation of the procedure. Initial ultrasound scanning demonstrates a moderate amount of ascites within the  left lower abdominal quadrant. The left lower abdomen was prepped and draped in the usual sterile fashion. 1% lidocaine was used for local anesthesia. Following this, a Yueh catheter was introduced. An ultrasound image was  saved for documentation purposes. The paracentesis was performed. The catheter was removed and a dressing was applied. The patient tolerated the procedure well without immediate post procedural complication. FINDINGS: A total of approximately 3 liters of clear, yellow fluid was removed. Samples were sent to the laboratory as requested by the clinical team. IMPRESSION: Successful ultrasound-guided diagnostic and therapeutic paracentesis yielding 3 liters of peritoneal fluid. Read by: Rowe Robert, PA-C Electronically Signed   By: Lucrezia Europe M.D.   On: 03/14/2016 16:58   US Abdomen Limited Ruq  03/14/2016  CLINICAL DATA:  History of cirrhosis.  Abnormal transaminases. EXAM: US ABDOMEN LIMITED - RIGHT UPPER QUADRANT COMPARISON:  Abdominal ultrasound 10/20/2015 FINDINGS: Gallbladder: The gallbladder is normally distended. No gallbladder stones or sludge are seen. There is thickening and edema of the gallbladder wall measuring up to 4.6 mm in cross-section. There is a tiny amount of pericholecystic fluid, however small amount of ascites is noted in perihepatic location as well. The sonographic Murphy's sign was reported as negative. Common bile duct: Diameter: Normal in diameter measuring 5 mm, where visualized. Liver: No focal lesion identified. The liver demonstrates nodular contour and diffusely increased echogenicity. There is a small amount of perihepatic ascites. IMPRESSION: Normally distended gallbladder with edematous wall measuring up to 4.6 mm. In the settings of liver cirrhosis this may represent reactive inflammatory changes, however acalculous cholecystitis cannot be excluded. Notably, the sonographic Murphy's sign was negative. Cirrhotic appearance of the liver was small volume of  abdominal ascites. Electronically Signed   By: Fidela Salisbury M.D.   On: 03/14/2016 17:44    Microbiology: Recent Results (from the past 240 hour(s))  MRSA PCR Screening     Status: None   Collection Time: 03/14/16  9:00 AM  Result Value Ref Range Status   MRSA by PCR NEGATIVE NEGATIVE Final    Comment:        The GeneXpert MRSA Assay (FDA approved for NASAL specimens only), is one component of a comprehensive MRSA colonization surveillance program. It is not intended to diagnose MRSA infection nor to guide or monitor treatment for MRSA infections.   Culture, blood (routine x 2) Call MD if unable to obtain prior to antibiotics being given     Status: None (Preliminary result)   Collection Time: 03/14/16  9:13 AM  Result Value Ref Range Status   Specimen Description BLOOD BLOOD RIGHT HAND  Final   Special Requests IN PEDIATRIC BOTTLE Lund  Final   Culture   Final    NO GROWTH 4 DAYS Performed at [facility]    Report Status PENDING  Incomplete  Culture, blood (routine x 2) Call MD if unable to obtain prior to antibiotics being given     Status: None (Preliminary result)   Collection Time: 03/14/16  9:13 AM  Result Value Ref Range Status   Specimen Description BLOOD RIGHT ARM  Final   Special Requests BOTTLES DRAWN AEROBIC AND ANAEROBIC 5CC  Final   Culture   Final    NO GROWTH 4 DAYS Performed at [facility]    Report Status PENDING  Incomplete  Culture, body fluid-bottle     Status: None (Preliminary result)   Collection Time: 03/14/16  5:28 PM  Result Value Ref Range Status   Specimen Description FLUID ASCITIC  Final   Special Requests BOTTLES DRAWN AEROBIC AND ANAEROBIC 10CC  Final   Culture   Final    NO GROWTH 4 DAYS Performed at [facility]  Report Status PENDING  Incomplete  Gram stain     Status: None   Collection Time: 03/14/16  5:28 PM  Result Value Ref Range Status   Specimen Description FLUID ASCITIC  Final   Special  Requests NONE  Final   Gram Stain   Final    FEW WBC PRESENT, PREDOMINANTLY MONONUCLEAR NO ORGANISMS SEEN Performed at [facility]    Report Status 03/14/2016 FINAL  Final  Culture, sputum-assessment     Status: None   Collection Time: 03/15/16 12:58 AM  Result Value Ref Range Status   Specimen Description SPUTUM  Final   Special Requests NONE  Final   Sputum evaluation   Final    MICROSCOPIC FINDINGS SUGGEST THAT THIS SPECIMEN IS NOT REPRESENTATIVE OF LOWER RESPIRATORY SECRETIONS. PLEASE RECOLLECT. SPOKE TO H.PENG RN [id number] A.QUIZON    Report Status 03/15/2016 FINAL  Final     Labs: Basic Metabolic Panel:  Recent Labs Lab 03/14/16 0736 03/15/16 0305 03/16/16 0432 03/17/16 0424 03/18/16 0441 03/19/16 0411  NA  --  126* 128* 128* 127* 125*  K  --  3.4* 3.4* 3.3* 3.8 3.9  CL  --  89* 92* 92* 92* 91*  CO2  --  _0 GLUCOSE  --  98 98 96 94 97  BUN  --  _1 CREATININE  --  0.68 0.63 0.69 0.73 0.69  CALCIUM  --  8.4* 8.0* 8.0* 8.1* 8.1*  MG 1.4*  --   --  1.1* 1.5* 1.8   Liver Function Tests:  Recent Labs Lab 03/14/16 0222 03/15/16 0305 03/17/16 0424 03/18/16 0441  AST 138* 99* 145* 136*  ALT _2 38  ALKPHOS 655* 458* 431* 492*  BILITOT 2.4* 5.8* 5.0* 6.3*  PROT 6.6 5.9* 5.6* 5.5*  ALBUMIN 3.0* 2.8* 2.7* 2.6*    Recent Labs Lab 03/14/16 0222  LIPASE 47   No results for input(s): AMMONIA in the last 168 hours. CBC:  Recent Labs Lab 03/14/16 0222 03/15/16 0305 03/15/16 1119 03/16/16 0432 03/17/16 0424 03/18/16 0441 03/19/16 0411  WBC 13.9* 10.1  --  10.6* 10.3 9.6 9.5  NEUTROABS 11.6*  --   --  9.0*  --   --   --   HGB 7.1* 11.1* 11.6* 11.0* 10.5* 10.8* 10.5*  HCT 21.1* 31.9*  --  32.2* 31.4* 32.1* 32.5*  MCV 95.0 90.1  --  91.7 92.9 92.8 95.3  PLT 190 122*  --  116* 113* 108* 127*   Cardiac Enzymes: No results for input(s): CKTOTAL, CKMB, CKMBINDEX, TROPONINI in the last 168 hours. BNP: BNP (last 3  results) No results for input(s): BNP in the last 8760 hours.  ProBNP (last 3 results) No results for input(s): PROBNP in the last 8760 hours.  CBG:  Recent Labs Lab 03/15/16 1117  GLUCAP 73       Signed:  Aarnav Steagall MD, PhD  [facility] 03/19/2016, 9:14 AM

## 2016-03-19 NOTE — Progress Notes (Signed)
Assessment unchanged. Packet with pt info as prepared by SW given to [facility] paramedic upon arrival to unit. Report called to facility earlier. VSS. Transported via stretcher accompanied by paramedics x 2 to meet awaiting ambulance to carry pt to [facility] facility.

## 2016-03-22 ENCOUNTER — Non-Acute Institutional Stay (SKILLED_NURSING_FACILITY): Payer: Medicare HMO | Admitting: Internal Medicine

## 2016-03-22 ENCOUNTER — Encounter: Payer: Self-pay | Admitting: Internal Medicine

## 2016-03-22 DIAGNOSIS — E871 Hypo-osmolality and hyponatremia: Secondary | ICD-10-CM

## 2016-03-22 DIAGNOSIS — D500 Iron deficiency anemia secondary to blood loss (chronic): Secondary | ICD-10-CM | POA: Diagnosis not present

## 2016-03-22 DIAGNOSIS — K869 Disease of pancreas, unspecified: Secondary | ICD-10-CM | POA: Diagnosis not present

## 2016-03-22 DIAGNOSIS — E876 Hypokalemia: Secondary | ICD-10-CM

## 2016-03-22 DIAGNOSIS — K7200 Acute and subacute hepatic failure without coma: Secondary | ICD-10-CM

## 2016-03-22 DIAGNOSIS — K7031 Alcoholic cirrhosis of liver with ascites: Secondary | ICD-10-CM

## 2016-03-22 DIAGNOSIS — Z789 Other specified health status: Secondary | ICD-10-CM

## 2016-03-22 DIAGNOSIS — M1A09X0 Idiopathic chronic gout, multiple sites, without tophus (tophi): Secondary | ICD-10-CM

## 2016-03-22 DIAGNOSIS — J3489 Other specified disorders of nose and nasal sinuses: Secondary | ICD-10-CM

## 2016-03-22 DIAGNOSIS — J9601 Acute respiratory failure with hypoxia: Secondary | ICD-10-CM | POA: Diagnosis not present

## 2016-03-22 DIAGNOSIS — E43 Unspecified severe protein-calorie malnutrition: Secondary | ICD-10-CM

## 2016-03-22 DIAGNOSIS — R220 Localized swelling, mass and lump, head: Secondary | ICD-10-CM | POA: Diagnosis not present

## 2016-03-22 DIAGNOSIS — R531 Weakness: Secondary | ICD-10-CM

## 2016-03-22 DIAGNOSIS — J189 Pneumonia, unspecified organism: Secondary | ICD-10-CM | POA: Diagnosis not present

## 2016-03-22 DIAGNOSIS — Z7289 Other problems related to lifestyle: Secondary | ICD-10-CM

## 2016-03-22 NOTE — Progress Notes (Signed)
MRN: [id number] Name: Adam Marshall  Sex: male Age: 55 y.o. DOB: 03/05/1961  [place] #:  Facility/Room: [facility] [place] / 109 P Level Of Care: SNF Provider: Noah Delaine. Sheppard Coil, MD Emergency Contacts: Extended Emergency Contact Information Primary Emergency Contact: [name],[name] Address: [place], [place] [address] Johnnette Litter of [place] Phone: [phone] Relation: Significant other  Code Status: Full Code  Allergies: Penicillins  Chief Complaint  Patient presents with  . New Admit To SNF    Admit to Facility    HPI: Patient is 55 y.o. male with Fatty liver, alcohol use, HTN, Gout who presents complain of abdominal distension that started 2 weeks ago. He relates that distension got worse day prior to admission. He also report having to use pampers because he is not able to control his bowel movement. He denies abdominal pain. He does relates SOB, cough, nausea and vomiting. He was found to be hypoxic in the ED. He is requiring 4 L to keep oxygen sat above 92. He denies fevers. Pt was admited to [facility] from 7/10-15 where he was found to have liver cirrhosis with fatty infiltration, ascites and B pleural effusions. Ascites was tapped for total 5 liters. Pt was dx with PNA and treatment started. Pt was found to have anemia,chronic blood loss from esophagitis and transfused. Course complicated more by electrolyte abnormalities, severe protein malnutrition. Incidental fiding of pyriform sinus mass, w/u begun by Dr [name] and pancreatic mass that neds f/u in a year. Pt was admitted to SNF for generalized weakness for OT/PT. While at SNF pt will be followed for ETOH abuse, tx with thiamine and folic acid, gout, tx with allopurinol and colchicine and AR, tx with flonase.    Past Medical History  Diagnosis Date  . Hypertension   . Chronic back pain   . Gout   . Chronic knee pain     left  . Fatty liver   . Abnormal LFTs   . Pancreatitis     Past Surgical History   Procedure Laterality Date  . Knee surgery      left  . Esophagogastroduodenoscopy N/A 03/15/2016    Procedure: ESOPHAGOGASTRODUODENOSCOPY (EGD);  Surgeon: Doran Stabler, MD;  Location: [facility];  Service: Endoscopy;  Laterality: N/A;  . Panendoscopy N/A 03/18/2016    Procedure: DIRECT LARYNGOSCOPY, WITH BRONCHOSCOPY, AND ESOPHAGOSCOPY, WITH BIOPSY;  Surgeon: [name], MD;  Location: [facility];  Service: ENT;  Laterality: N/A;      Medication List       This list is accurate as of: 03/22/16 11:59 PM.  Always use your most recent med list.               albuterol (2.5 MG/3ML) 0.083% nebulizer solution  Commonly known as:  PROVENTIL  Take 3 mLs (2.5 mg total) by nebulization every 6 (six) hours as needed for wheezing or shortness of breath.     allopurinol 100 MG tablet  Commonly known as:  ZYLOPRIM  Take 1 tablet (100 mg total) by mouth daily.     aspirin 81 MG chewable tablet  Chew 81 mg by mouth daily.     colchicine 0.6 MG tablet  Take 1 tablet (0.6 mg total) by mouth daily.     cyclobenzaprine 5 MG tablet  Commonly known as:  FLEXERIL  Take 1 tablet (5 mg total) by mouth 3 (three) times daily as needed for muscle spasms.  feeding supplement (ENSURE ENLIVE) Liqd  Take 237 mLs by mouth 3 (three) times daily between meals.     fluticasone 50 MCG/ACT nasal spray  Commonly known as:  FLONASE  Place 1 spray into the nose daily.     folic acid 1 MG tablet  Commonly known as:  FOLVITE  Take 1 tablet (1 mg total) by mouth daily.     furosemide 40 MG tablet  Commonly known as:  LASIX  Take 1 tablet (40 mg total) by mouth daily.     guaiFENesin 600 MG 12 hr tablet  Commonly known as:  MUCINEX  Take 1 tablet (600 mg total) by mouth 2 (two) times daily.     lactulose 10 g packet  Commonly known as:  CEPHULAC  Take 1 packet (10 g total) by mouth daily.     levofloxacin 750 MG tablet  Commonly known as:  LEVAQUIN  Take 1 tablet (750 mg total) by mouth  daily.     meloxicam 15 MG tablet  Commonly known as:  MOBIC  Take 1 tablet (15 mg total) by mouth daily as needed for pain.     mupirocin cream 2 %  Commonly known as:  BACTROBAN  Apply 1 application topically 2 (two) times daily.     pantoprazole 40 MG tablet  Commonly known as:  PROTONIX  Take 1 tablet (40 mg total) by mouth 2 (two) times daily before a meal.     propranolol 10 MG tablet  Commonly known as:  INDERAL  Take 1 tablet (10 mg total) by mouth 2 (two) times daily.     sodium chloride 0.65 % Soln nasal spray  Commonly known as:  OCEAN  Place 2 sprays into both nostrils 2 (two) times daily.     spironolactone 25 MG tablet  Commonly known as:  ALDACTONE  Take 1 tablet (25 mg total) by mouth daily.     thiamine 100 MG tablet  Commonly known as:  VITAMIN B-1  Take 1 tablet (100 mg total) by mouth daily.     XIIDRA 5 % Soln  Generic drug:  Lifitegrast  Apply 1 drop to eye 2 (two) times daily.        No orders of the defined types were placed in this encounter.    Immunization History  Administered Date(s) Administered  . Influenza Split 10/20/2010    Social History  Substance Use Topics  . Smoking status: Current Every Day Smoker -- 0.30 packs/day for 30 years    Types: Cigarettes  . Smokeless tobacco: Never Used  . Alcohol Use: 16.8 oz/week    28 Cans of beer per week     Comment: Occasional    Family history is   Family History  Problem Relation Age of Onset  . Diabetes Mother   . Diabetes Maternal Grandmother       Review of Systems  DATA OBTAINED: from patient, wife GENERAL:  no fevers, fatigue, appetite changes SKIN: No itching, rash or wounds EYES: No eye pain, redness, discharge EARS: No earache, tinnitus, change in hearing NOSE: No congestion, drainage or bleeding  MOUTH/THROAT: No mouth or tooth pain, No sore throat RESPIRATORY: No cough, wheezing, SOB CARDIAC: No chest pain, palpitations, lower extremity edema  GI: No  abdominal pain, No N/V/D or constipation, No heartburn or reflux  GU: No dysuria, frequency or urgency, or incontinence  MUSCULOSKELETAL: No unrelieved bone/joint pain NEUROLOGIC: No headache, dizziness or focal weakness; pt falls a lot from losing  his balance PSYCHIATRIC: No c/o anxiety or sadness   Filed Vitals:   03/22/16 1018  BP: 91/69  Pulse: 110  Temp: 98.7 F (37.1 C)  Resp: 18    SpO2 Readings from Last 1 Encounters:  03/23/16 95%        Physical Exam  GENERAL APPEARANCE: Alert, conversant,  No acute distress.  SKIN: No diaphoresis rash HEAD: Normocephalic, atraumatic  EYES: Conjunctiva/lids clear. Pupils round, reactive. EOMs intact.  EARS: External exam WNL, canals clear. Hearing grossly normal.  NOSE: No deformity or discharge.  MOUTH/THROAT: Lips w/o lesions  RESPIRATORY: Breathing is even, unlabored. Lung sounds are clear   CARDIOVASCULAR: Heart RRR no murmurs, rubs or gallops. No peripheral edema.   GASTROINTESTINAL: Abdomen is soft, non-tender, not distended w/ normal bowel sounds. GENITOURINARY: Bladder non tender, not distended  MUSCULOSKELETAL: No abnormal joints or musculature NEUROLOGIC:  Cranial nerves 2-12 grossly intact. Moves all extremities  PSYCHIATRIC: Mood and affect appropriate to situation, quiet, some dementia, no behavioral issues  Patient Active Problem List   Diagnosis Date Noted  . Weakness 03/26/2016  . Gout 03/26/2016  . HCAP (healthcare-associated pneumonia) 03/23/2016  . Hypokalemia 03/23/2016  . Hypomagnesemia 03/23/2016  . Protein-calorie malnutrition, severe ([place]) 03/23/2016  . Jaundice   . Reflux esophagitis   . Pancreatic lesion   . Abnormal transaminases   . Normochromic normocytic anemia   . Pyriform sinus mass   . Melena   . Ascites   . Cirrhosis of liver with ascites ([place]) 03/14/2016  . Liver failure ([place]) 03/14/2016  . Anemia due to chronic blood loss 03/14/2016  . Respiratory failure with hypoxia ([place])  03/14/2016  . Lesion of pancreas   . Generalized abdominal pain   . Hyponatremia   . Abscess of skin of abdomen 01/13/2016  . Leg cramping 01/13/2016  . Abnormal LFTs (liver function tests) 10/13/2015  . Alcohol use ([place]) 10/13/2015  . Left knee pain 02/04/2015  . Encounter for general adult medical examination with abnormal findings 12/25/2014  . Medicare annual wellness visit, subsequent 12/25/2014  . Knee pain 12/08/2014  . Chronic pancreatitis ([place]) 05/03/2011  . Nausea and vomiting 04/24/2011  . Rhinorrhea 04/24/2011  . Gastroenteritis 10/20/2010  . PREMATURE VENTRICULAR CONTRACTIONS 07/07/2010  . HYPERKALEMIA 06/29/2010  . ACUTE GOUTY ARTHROPATHY 06/28/2010  . HYPERTENSION, BENIGN ESSENTIAL 06/28/2010       Component Value Date/Time   WBC 9.5 03/19/2016 0411   WBC 9.5 03/19/2016   RBC 3.41* 03/19/2016 0411   RBC 2.50* 03/14/2016 0736   HGB 10.5* 03/19/2016 0411   HCT 32.5* 03/19/2016 0411   PLT 127* 03/19/2016 0411   MCV 95.3 03/19/2016 0411   LYMPHSABS 0.8 03/16/2016 0432   MONOABS 0.8 03/16/2016 0432   EOSABS 0.0 03/16/2016 0432   BASOSABS 0.0 03/16/2016 0432        Component Value Date/Time   NA 125* 03/19/2016 0411   NA 125* 03/19/2016   K 3.9 03/19/2016 0411   CL 91* 03/19/2016 0411   CO2 25 03/19/2016 0411   GLUCOSE 97 03/19/2016 0411   BUN 14 03/19/2016 0411   BUN 14 03/19/2016   CREATININE 0.69 03/19/2016 0411   CREATININE 0.7 03/19/2016   CREATININE 0.91 05/02/2011 1150   CALCIUM 8.1* 03/19/2016 0411   PROT 5.5* 03/18/2016 0441   ALBUMIN 2.6* 03/18/2016 0441   AST 136* 03/18/2016 0441   ALT 38 03/18/2016 0441   ALKPHOS 492* 03/18/2016 0441   BILITOT 6.3* 03/18/2016 0441   GFRNONAA >60 03/19/2016 0411  GFRAA >60 03/19/2016 0411    Lab Results  Component Value Date   HGBA1C 5.2 05/02/2011    Lab Results  Component Value Date   CHOL 197 10/13/2015   HDL 82.60 10/13/2015   LDLCALC 88 10/13/2015   TRIG 131.0 10/13/2015   CHOLHDL 2  10/13/2015     No results found.  Not all labs, radiology exams or other studies done during hospitalization come through on my EPIC note; however they are reviewed by me.    Assessment and Plan  Acute hypoxic Respiratory Failure:  This could be multifactorial, might have underline emphysema, anemia, ?PNA, Abdominal distension.  He was admitted to step down initially S/p 2 units PRBC. s/p Paracentesis.  SNF - continue abx to finish 7 day treatment, mucinex Repeat cxr two view in 3-4 weeks after discharge  Cirrhosis Liver/ascites; In setting of alcohol, fatty liver.  paracentesisx2 for ascites. Albuminx2, No ab pain SNF - continue lasix/spironolectone/propranolol, continue low sodium diet  Hyponatremia; asymptomatic, no confusion, hyponatremia could be secondary to cirrhosis and or dehydration.  Would Avoid IV fluids due to significant ascites.  SNF - f/u BMP   chronic blood loss Anemia:  hgb 7 on admission, Last hb per records 5 months ago was at 11.  B12/folate/iron panel unremarkable/tsh wnl S/p prbc transfusionx2 units on 7/10 EGd with LA grad D reflux esophagitis, and nonbleeding gastric ulcer,  SNF - cont protonix BID bid for 8 weeks per GI recommendation  PNA; He report productive cough, chest x ray with possible infiltrates. Leukocytosis.  SNF - continue Levaquin 750 mg for 5 more days Repeat cxr in 3-4 weeks   Alcohol use: High risk for withdrawal.  CIWA protocol ordered.  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  Sinus tachycardia, resolved with low dose lopressor SNF - cont propranolol 10 mg  BID   FTT, weakness, Severe malnutrition in context of chronic illness Body mass index is 18.82 kg/(m^2).  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 Suggest follow-up MRI in 1 year.  GOUT SNF- cont colchicine and allopurinol  AR SNF - cont flonase    Time spent > 45 min;> 50% of time with patient was spent reviewing records, labs, tests and studies, counseling and developing plan of care  Webb Silversmith D. Sheppard Coil, MD

## 2016-03-23 ENCOUNTER — Encounter: Payer: Self-pay | Admitting: Internal Medicine

## 2016-03-23 ENCOUNTER — Non-Acute Institutional Stay (SKILLED_NURSING_FACILITY): Payer: Medicare HMO | Admitting: Internal Medicine

## 2016-03-23 DIAGNOSIS — R220 Localized swelling, mass and lump, head: Secondary | ICD-10-CM

## 2016-03-23 DIAGNOSIS — R531 Weakness: Secondary | ICD-10-CM | POA: Diagnosis not present

## 2016-03-23 DIAGNOSIS — E876 Hypokalemia: Secondary | ICD-10-CM

## 2016-03-23 DIAGNOSIS — K7031 Alcoholic cirrhosis of liver with ascites: Secondary | ICD-10-CM

## 2016-03-23 DIAGNOSIS — K2100 Gastro-esophageal reflux disease with esophagitis, without bleeding: Secondary | ICD-10-CM

## 2016-03-23 DIAGNOSIS — K869 Disease of pancreas, unspecified: Secondary | ICD-10-CM

## 2016-03-23 DIAGNOSIS — R748 Abnormal levels of other serum enzymes: Secondary | ICD-10-CM

## 2016-03-23 DIAGNOSIS — E43 Unspecified severe protein-calorie malnutrition: Secondary | ICD-10-CM | POA: Diagnosis not present

## 2016-03-23 DIAGNOSIS — G119 Hereditary ataxia, unspecified: Secondary | ICD-10-CM | POA: Diagnosis not present

## 2016-03-23 DIAGNOSIS — J9601 Acute respiratory failure with hypoxia: Secondary | ICD-10-CM

## 2016-03-23 DIAGNOSIS — R188 Other ascites: Secondary | ICD-10-CM

## 2016-03-23 DIAGNOSIS — J3489 Other specified disorders of nose and nasal sinuses: Secondary | ICD-10-CM

## 2016-03-23 DIAGNOSIS — E871 Hypo-osmolality and hyponatremia: Secondary | ICD-10-CM

## 2016-03-23 DIAGNOSIS — J189 Pneumonia, unspecified organism: Secondary | ICD-10-CM

## 2016-03-23 DIAGNOSIS — D500 Iron deficiency anemia secondary to blood loss (chronic): Secondary | ICD-10-CM

## 2016-03-23 NOTE — Progress Notes (Signed)
MRN: [id number] Name: Adam Marshall  Sex: male Age: 55 y.o. DOB: Jul 17, 1961  [place] #:  Facility/Room: [facility] [place] / 109 P Level Of Care: SNF Provider: Noah Delaine. Sheppard Coil, MD Emergency Contacts: Extended Emergency Contact Information Primary Emergency Contact: [name],[name] Address: [place], [place] [address] Johnnette Litter of [place] Phone: [phone] Relation: Significant other  Code Status: Full Code  Allergies: Penicillins  Chief Complaint  Patient presents with  . Acute Visit    Acute     HPI: Patient is 55 y.o. male who is being seen before I speak to PT about them, then have a peer to peer review with the insurance company who has said the pt does not meet criteria for staying at [facility].  Past Medical History  Diagnosis Date  . Hypertension   . Chronic back pain   . Gout   . Chronic knee pain     left  . Fatty liver   . Abnormal LFTs   . Pancreatitis     Past Surgical History  Procedure Laterality Date  . Knee surgery      left  . Esophagogastroduodenoscopy N/A 03/15/2016    Procedure: ESOPHAGOGASTRODUODENOSCOPY (EGD);  Surgeon: Doran Stabler, MD;  Location: [facility];  Service: Endoscopy;  Laterality: N/A;  . Panendoscopy N/A 03/18/2016    Procedure: DIRECT LARYNGOSCOPY, WITH BRONCHOSCOPY, AND ESOPHAGOSCOPY, WITH BIOPSY;  Surgeon: [name], MD;  Location: [facility];  Service: ENT;  Laterality: N/A;      Medication List       This list is accurate as of: 03/23/16 11:59 PM.  Always use your most recent med list.               albuterol (2.5 MG/3ML) 0.083% nebulizer solution  Commonly known as:  PROVENTIL  Take 3 mLs (2.5 mg total) by nebulization every 6 (six) hours as needed for wheezing or shortness of breath.     allopurinol 100 MG tablet  Commonly known as:  ZYLOPRIM  Take 1 tablet (100 mg total) by mouth daily.     aspirin 81 MG chewable tablet  Chew 81 mg by mouth daily.     colchicine 0.6 MG tablet  Take 1  tablet (0.6 mg total) by mouth daily.     cyclobenzaprine 5 MG tablet  Commonly known as:  FLEXERIL  Take 1 tablet (5 mg total) by mouth 3 (three) times daily as needed for muscle spasms.     feeding supplement (ENSURE ENLIVE) Liqd  Take 237 mLs by mouth 3 (three) times daily between meals.     fluticasone 50 MCG/ACT nasal spray  Commonly known as:  FLONASE  Place 1 spray into the nose daily.     folic acid 1 MG tablet  Commonly known as:  FOLVITE  Take 1 tablet (1 mg total) by mouth daily.     furosemide 40 MG tablet  Commonly known as:  LASIX  Take 1 tablet (40 mg total) by mouth daily.     guaiFENesin 600 MG 12 hr tablet  Commonly known as:  MUCINEX  Take 1 tablet (600 mg total) by mouth 2 (two) times daily.     lactulose 10 g packet  Commonly known as:  CEPHULAC  Take 1 packet (10 g total) by mouth daily.     mupirocin cream 2 %  Commonly known as:  BACTROBAN  Apply 1 application topically 2 (two) times daily.  pantoprazole 40 MG tablet  Commonly known as:  PROTONIX  Take 1 tablet (40 mg total) by mouth 2 (two) times daily before a meal.     propranolol 10 MG tablet  Commonly known as:  INDERAL  Take 1 tablet (10 mg total) by mouth 2 (two) times daily.     sodium chloride 0.65 % Soln nasal spray  Commonly known as:  OCEAN  Place 2 sprays into both nostrils 2 (two) times daily.     spironolactone 25 MG tablet  Commonly known as:  ALDACTONE  Take 1 tablet (25 mg total) by mouth daily.     thiamine 100 MG tablet  Commonly known as:  VITAMIN B-1  Take 1 tablet (100 mg total) by mouth daily.     XIIDRA 5 % Soln  Generic drug:  Lifitegrast  Apply 1 drop to eye 2 (two) times daily.        No orders of the defined types were placed in this encounter.    Immunization History  Administered Date(s) Administered  . Influenza Split 10/20/2010    Social History  Substance Use Topics  . Smoking status: Current Every Day Smoker -- 0.30 packs/day for 30  years    Types: Cigarettes  . Smokeless tobacco: Never Used  . Alcohol Use: 16.8 oz/week    28 Cans of beer per week     Comment: Occasional    Review of Systems  DATA OBTAINED: from patient, nurse,wife GENERAL:  no fevers, fatigue, appetite changes SKIN: No itching, rash HEENT: No complaint RESPIRATORY: No cough, wheezing, SOB CARDIAC: No chest pain, palpitations, lower extremity edema  GI: No abdominal pain, No N/V/D or constipation, No heartburn or reflux  GU: No dysuria, frequency or urgency, or incontinence  MUSCULOSKELETAL: No unrelieved bone/joint pain NEUROLOGIC: No headache, dizziness; wife really stressed how much pt falls  PSYCHIATRIC: No overt anxiety or sadness  Filed Vitals:   03/23/16 1409  BP: 115/76  Pulse: 108  Temp: 98.6 F (37 C)  Resp: 16    Physical Exam  GENERAL APPEARANCE: Alert, minconversant, No acute distress  SKIN: No diaphoresis rash HEENT: Unremarkable RESPIRATORY: Breathing is even, unlabored. Lung sounds are clear   CARDIOVASCULAR: Heart RRR no murmurs, rubs or gallops. No peripheral edema  GASTROINTESTINAL: Abdomen is soft, non-tender, not distended w/ normal bowel sounds.  GENITOURINARY: Bladder non tender, not distended  MUSCULOSKELETAL: thin NEUROLOGIC: Cranial nerves 2-12 grossly intact. Moves all extremities PSYCHIATRIC: vague affect, no behavioral issues  Patient Active Problem List   Diagnosis Date Noted  . Weakness 03/26/2016  . Gout 03/26/2016  . HCAP (healthcare-associated pneumonia) 03/23/2016  . Hypokalemia 03/23/2016  . Hypomagnesemia 03/23/2016  . Protein-calorie malnutrition, severe ([place]) 03/23/2016  . Jaundice   . Reflux esophagitis   . Pancreatic lesion   . Abnormal transaminases   . Normochromic normocytic anemia   . Pyriform sinus mass   . Melena   . Ascites   . Cirrhosis of liver with ascites ([place]) 03/14/2016  . Liver failure ([place]) 03/14/2016  . Anemia due to chronic blood loss 03/14/2016  .  Respiratory failure with hypoxia ([place]) 03/14/2016  . Lesion of pancreas   . Generalized abdominal pain   . Hyponatremia   . Abscess of skin of abdomen 01/13/2016  . Leg cramping 01/13/2016  . Abnormal LFTs (liver function tests) 10/13/2015  . Alcohol use ([place]) 10/13/2015  . Left knee pain 02/04/2015  . Encounter for general adult medical examination with abnormal findings 12/25/2014  .  Medicare annual wellness visit, subsequent 12/25/2014  . Knee pain 12/08/2014  . Chronic pancreatitis ([place]) 05/03/2011  . Nausea and vomiting 04/24/2011  . Rhinorrhea 04/24/2011  . Gastroenteritis 10/20/2010  . PREMATURE VENTRICULAR CONTRACTIONS 07/07/2010  . HYPERKALEMIA 06/29/2010  . ACUTE GOUTY ARTHROPATHY 06/28/2010  . HYPERTENSION, BENIGN ESSENTIAL 06/28/2010    CBC    Component Value Date/Time   WBC 9.5 03/19/2016 0411   WBC 9.5 03/19/2016   RBC 3.41* 03/19/2016 0411   RBC 2.50* 03/14/2016 0736   HGB 10.5* 03/19/2016 0411   HCT 32.5* 03/19/2016 0411   PLT 127* 03/19/2016 0411   MCV 95.3 03/19/2016 0411   LYMPHSABS 0.8 03/16/2016 0432   MONOABS 0.8 03/16/2016 0432   EOSABS 0.0 03/16/2016 0432   BASOSABS 0.0 03/16/2016 0432    CMP     Component Value Date/Time   NA 125* 03/19/2016 0411   NA 125* 03/19/2016   K 3.9 03/19/2016 0411   CL 91* 03/19/2016 0411   CO2 25 03/19/2016 0411   GLUCOSE 97 03/19/2016 0411   BUN 14 03/19/2016 0411   BUN 14 03/19/2016   CREATININE 0.69 03/19/2016 0411   CREATININE 0.7 03/19/2016   CREATININE 0.91 05/02/2011 1150   CALCIUM 8.1* 03/19/2016 0411   PROT 5.5* 03/18/2016 0441   ALBUMIN 2.6* 03/18/2016 0441   AST 136* 03/18/2016 0441   ALT 38 03/18/2016 0441   ALKPHOS 492* 03/18/2016 0441   BILITOT 6.3* 03/18/2016 0441   GFRNONAA >60 03/19/2016 0411   GFRAA >60 03/19/2016 0411    Assessment and Plan  ATAXIA/GENERALIZED WEAKNESS/SEVERE PROTEIN CALORIE MALNUTRITION- spoke with PT who feels that pt could still make gains with them despite the  fact that pt can walk 150 feet and take care of his own ADLs. Spoke with insurance MD. She would not change her mind about pt being able to go home but she did instruct me to write very specific and detailed orders for the care of the pt once he is at home. Information was relayed over 2 seoarate phone calls. She did say she would give th SNF several days to set all this up.   Time spent > 35 min;> 50% of time with patient was spent reviewing records, labs, tests and studies, counseling and developing plan of care  Noah Delaine. Sheppard Coil, MD

## 2016-03-23 NOTE — Progress Notes (Signed)
MRN: [id number] Name: KHADER BURGY  Sex: male Age: 55 y.o. DOB: [birth year]  [place] #:  Facility/Room:[facility] Level Of Care: SNF Provider: Inocencio Homes D Emergency Contacts: Extended Emergency Contact Information Primary Emergency Contact: [name],[name] Address: [place], [place] [address] [place] Phone: [phone] Relation: Significant other  Code Status:   Allergies: Penicillins  Chief Complaint  Patient presents with  . Discharge Note    Discharged from SNF    HPI: Patient is 55 y.o. male who was admited to [facility] from 7/10-15 where he was found to have liver cirrhosis with fatty infiltration, ascites and B pleural effusions. Ascites was tapped for total 5 liters. Pt was dx with PNA and treatment started. Pt was found to have anemia,chronic blood loss from esophagitis and transfused. Course complicated more by electrolyte abnormalities, severe protein malnutrition. Incidental fiding of pyriform sinus mass, w/u begun by Dr [name] and pancreatic mass that neds f/u in a year. Pt was admitted to SNF for generalized weakness for OT/PT. Pt is being d/c secondary to being cut by insurance.  Past Medical History  Diagnosis Date  . Hypertension   . Chronic back pain   . Gout   . Chronic knee pain     left  . Fatty liver   . Abnormal LFTs   . Pancreatitis     Past Surgical History  Procedure Laterality Date  . Knee surgery      left  . Esophagogastroduodenoscopy N/A 03/15/2016    Procedure: ESOPHAGOGASTRODUODENOSCOPY (EGD);  Surgeon: Doran Stabler, MD;  Location: [facility];  Service: Endoscopy;  Laterality: N/A;  . Panendoscopy N/A 03/18/2016    Procedure: DIRECT LARYNGOSCOPY, WITH BRONCHOSCOPY, AND ESOPHAGOSCOPY, WITH BIOPSY;  Surgeon: [name], MD;  Location: [facility];  Service: ENT;  Laterality: N/A;      Medication List       This list is accurate as of: 03/24/16 11:59 PM.  Always use your most recent med list.                albuterol (2.5 MG/3ML) 0.083% nebulizer solution  Commonly known as:  PROVENTIL  Take 3 mLs (2.5 mg total) by nebulization every 6 (six) hours as needed for wheezing or shortness of breath.     allopurinol 100 MG tablet  Commonly known as:  ZYLOPRIM  Take 1 tablet (100 mg total) by mouth daily.     aspirin 81 MG chewable tablet  Chew 81 mg by mouth daily.     colchicine 0.6 MG tablet  Take 1 tablet (0.6 mg total) by mouth daily.     cyclobenzaprine 5 MG tablet  Commonly known as:  FLEXERIL  Take 1 tablet (5 mg total) by mouth 3 (three) times daily as needed for muscle spasms.     feeding supplement (ENSURE ENLIVE) Liqd  Take 237 mLs by mouth 3 (three) times daily between meals.     fluticasone 50 MCG/ACT nasal spray  Commonly known as:  FLONASE  Place 1 spray into the nose daily.     folic acid 1 MG tablet  Commonly known as:  FOLVITE  Take 1 tablet (1 mg total) by mouth daily.     furosemide 40 MG tablet  Commonly known as:  LASIX  Take 1 tablet (40 mg total) by mouth daily.     guaiFENesin 600 MG 12 hr tablet  Commonly known as:  MUCINEX  Take 1 tablet (600 mg total) by mouth 2 (two) times daily.     lactulose 10 g packet  Commonly known as:  CEPHULAC  Take 1 packet (10 g total) by mouth daily.     mupirocin cream 2 %  Commonly known as:  BACTROBAN  Apply 1 application topically 2 (two) times daily.     pantoprazole 40 MG tablet  Commonly known as:  PROTONIX  Take 1 tablet (40 mg total) by mouth 2 (two) times daily before a meal.     propranolol 10 MG tablet  Commonly known as:  INDERAL  Take 1 tablet (10 mg total) by mouth 2 (two) times daily.     sodium chloride 0.65 % Soln nasal spray  Commonly known as:  OCEAN  Place 2 sprays into both nostrils 2 (two) times daily.     spironolactone 25 MG tablet  Commonly known as:  ALDACTONE  Take 1 tablet (25 mg total) by mouth daily.     thiamine 100 MG tablet  Commonly known as:  VITAMIN B-1   Take 1 tablet (100 mg total) by mouth daily.     XIIDRA 5 % Soln  Generic drug:  Lifitegrast  Apply 1 drop to eye 2 (two) times daily.        No orders of the defined types were placed in this encounter.    Immunization History  Administered Date(s) Administered  . Influenza Split 10/20/2010    Social History  Substance Use Topics  . Smoking status: Current Every Day Smoker -- 0.30 packs/day for 30 years    Types: Cigarettes  . Smokeless tobacco: Never Used  . Alcohol Use: 16.8 oz/week    28 Cans of beer per week     Comment: Occasional    Filed Vitals:   03/23/16 1414  BP: 115/76  Pulse: 108  Temp: 98.6 F (37 C)  Resp: 16    Physical Exam  GENERAL APPEARANCE: Alert, min conversant. No acute distress.  HEENT: Unremarkable. RESPIRATORY: Breathing is even, unlabored. Lung sounds are clear   CARDIOVASCULAR: Heart RRR no murmurs, rubs or gallops. No peripheral edema.  GASTROINTESTINAL: Abdomen is soft, non-tender, not distended w/ normal bowel sounds.  NEUROLOGIC: Cranial nerves 2-12 grossly intact. Moves all extremities  Patient Active Problem List   Diagnosis Date Noted  . Weakness 03/26/2016  . Gout 03/26/2016  . 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 HCAP (healthcare-associated pneumonia) 03/23/2016  . Hypokalemia 03/23/2016  . Hypomagnesemia 03/23/2016  . Protein-calorie malnutrition, severe ([place]) 03/23/2016  . Jaundice   . Reflux esophagitis   . Pancreatic lesion   . Abnormal transaminases   . Normochromic normocytic anemia   . Pyriform sinus mass   . Melena   . Ascites   . Cirrhosis of liver with ascites ([place]) 03/14/2016  . Liver failure ([place]) 03/14/2016  . Anemia due to chronic blood loss 03/14/2016  . Respiratory failure with hypoxia ([place]) 03/14/2016  . Lesion of pancreas   . Generalized abdominal pain   . Hyponatremia   . Abscess of skin of abdomen 01/13/2016  . Leg cramping 01/13/2016  . Abnormal LFTs (liver function  tests) 10/13/2015  . Alcohol use ([place]) 10/13/2015  . Left knee pain 02/04/2015  . Encounter for general adult medical examination with abnormal findings 12/25/2014  . Medicare annual wellness visit, subsequent 12/25/2014  . Knee pain 12/08/2014  . Chronic pancreatitis ([place]) 05/03/2011  . Nausea and vomiting 04/24/2011  . Rhinorrhea 04/24/2011  .  Gastroenteritis 10/20/2010  . PREMATURE VENTRICULAR CONTRACTIONS 07/07/2010  . HYPERKALEMIA 06/29/2010  . ACUTE GOUTY ARTHROPATHY 06/28/2010  . HYPERTENSION, BENIGN ESSENTIAL 06/28/2010    CBC    Component Value Date/Time   WBC 9.5 03/19/2016 0411   WBC 9.5 03/19/2016   RBC 3.41* 03/19/2016 0411   RBC 2.50* 03/14/2016 0736   HGB 10.5* 03/19/2016 0411   HCT 32.5* 03/19/2016 0411   PLT 127* 03/19/2016 0411   MCV 95.3 03/19/2016 0411   LYMPHSABS 0.8 03/16/2016 0432   MONOABS 0.8 03/16/2016 0432   EOSABS 0.0 03/16/2016 0432   BASOSABS 0.0 03/16/2016 0432    CMP     Component Value Date/Time   NA 125* 03/19/2016 0411   NA 125* 03/19/2016   K 3.9 03/19/2016 0411   CL 91* 03/19/2016 0411   CO2 25 03/19/2016 0411   GLUCOSE 97 03/19/2016 0411   BUN 14 03/19/2016 0411   BUN 14 03/19/2016   CREATININE 0.69 03/19/2016 0411   CREATININE 0.7 03/19/2016   CREATININE 0.91 05/02/2011 1150   CALCIUM 8.1* 03/19/2016 0411   PROT 5.5* 03/18/2016 0441   ALBUMIN 2.6* 03/18/2016 0441   AST 136* 03/18/2016 0441   ALT 38 03/18/2016 0441   ALKPHOS 492* 03/18/2016 0441   BILITOT 6.3* 03/18/2016 0441   GFRNONAA >60 03/19/2016 0411   GFRAA >60 03/19/2016 0411    Assessment and Plan  Pt is d/c to home with HH//PT/Nursing/aid/SW. The pt is complicated. He needs nursing to follow for weight gain and skin turgor over abdomen 2/2 ascites, pt was drained of 5 liters in hospital.. Pt has protein malnutrition , he will need calorie counts. Pt had multiple electrolyte abnormalities, he will need BMP/CMP drawn to follow. Pt presented with Hb 7 and dx with  esophagitis and gastric ulcer,was transfused in hospital but  will need follow CBC's. He is on lactulose , his MS needs following and whether pt is having loose stool daily . Pt with ETOH abuse , nursing and aids can monitor drinking, ideally none,  and ETOH in the home. SW can get him set up with AA and help facilitate other supports if available.   Medications have been reconciled and Rx's written.      Hennie Duos, MD

## 2016-03-23 NOTE — Progress Notes (Signed)
MRN: [id number] Name: Adam Marshall  Sex: male Age: 55 y.o. DOB: [birth year]  [place] #:  Facility/Room: Level Of Care: SNF Provider: Inocencio Homes D Emergency Contacts: Extended Emergency Contact Information Primary Emergency Contact: [name],[name] Address: [place], [place] [address] [place] Phone: [phone] Relation: Significant other  Code Status:   Allergies: Penicillins  No chief complaint on file.   HPI: Patient is 55 y.o. male who  Past Medical History  Diagnosis Date  . Hypertension   . Chronic back pain   . Gout   . Chronic knee pain     left  . Fatty liver   . Abnormal LFTs   . Pancreatitis     Past Surgical History  Procedure Laterality Date  . Knee surgery      left  . Esophagogastroduodenoscopy N/A 03/15/2016    Procedure: ESOPHAGOGASTRODUODENOSCOPY (EGD);  Surgeon: Doran Stabler, MD;  Location: [facility];  Service: Endoscopy;  Laterality: N/A;  . Panendoscopy N/A 03/18/2016    Procedure: DIRECT LARYNGOSCOPY, WITH BRONCHOSCOPY, AND ESOPHAGOSCOPY, WITH BIOPSY;  Surgeon: [name], MD;  Location: [facility];  Service: ENT;  Laterality: N/A;      Medication List       This list is accurate as of: 03/23/16 11:46 AM.  Always use your most recent med list.               albuterol (2.5 MG/3ML) 0.083% nebulizer solution  Commonly known as:  PROVENTIL  Take 3 mLs (2.5 mg total) by nebulization every 6 (six) hours as needed for wheezing or shortness of breath.     allopurinol 100 MG tablet  Commonly known as:  ZYLOPRIM  Take 1 tablet (100 mg total) by mouth daily.     aspirin 81 MG chewable tablet  Chew 81 mg by mouth daily.     colchicine 0.6 MG tablet  Take 1 tablet (0.6 mg total) by mouth daily.     cyclobenzaprine 5 MG tablet  Commonly known as:  FLEXERIL  Take 1 tablet (5 mg total) by mouth 3 (three) times daily as needed for muscle spasms.     feeding supplement (ENSURE ENLIVE) Liqd   Take 237 mLs by mouth 3 (three) times daily between meals.     fluticasone 50 MCG/ACT nasal spray  Commonly known as:  FLONASE  Place 1 spray into the nose daily.     folic acid 1 MG tablet  Commonly known as:  FOLVITE  Take 1 tablet (1 mg total) by mouth daily.     furosemide 40 MG tablet  Commonly known as:  LASIX  Take 1 tablet (40 mg total) by mouth daily.     guaiFENesin 600 MG 12 hr tablet  Commonly known as:  MUCINEX  Take 1 tablet (600 mg total) by mouth 2 (two) times daily.     lactulose 10 g packet  Commonly known as:  CEPHULAC  Take 1 packet (10 g total) by mouth daily.     levofloxacin 750 MG tablet  Commonly known as:  LEVAQUIN  Take 1 tablet (750 mg total) by mouth daily.     meloxicam 15 MG tablet  Commonly known as:  MOBIC  Take 1 tablet (15 mg total) by mouth daily as needed for pain.     mupirocin cream 2 %  Commonly known as:  BACTROBAN  Apply 1 application topically 2 (two) times daily.  pantoprazole 40 MG tablet  Commonly known as:  PROTONIX  Take 1 tablet (40 mg total) by mouth 2 (two) times daily before a meal.     propranolol 10 MG tablet  Commonly known as:  INDERAL  Take 1 tablet (10 mg total) by mouth 2 (two) times daily.     sodium chloride 0.65 % Soln nasal spray  Commonly known as:  OCEAN  Place 2 sprays into both nostrils 2 (two) times daily.     spironolactone 25 MG tablet  Commonly known as:  ALDACTONE  Take 1 tablet (25 mg total) by mouth daily.     thiamine 100 MG tablet  Commonly known as:  VITAMIN B-1  Take 1 tablet (100 mg total) by mouth daily.     XIIDRA 5 % Soln  Generic drug:  Lifitegrast  Apply 1 drop to eye 2 (two) times daily.        No orders of the defined types were placed in this encounter.    Immunization History  Administered Date(s) Administered  . Influenza Split 10/20/2010    Social History  Substance Use Topics  . Smoking status: Current Every Day Smoker -- 0.30 packs/day for 30 years     Types: Cigarettes  . Smokeless tobacco: Never Used  . Alcohol Use: 16.8 oz/week    28 Cans of beer per week     Comment: Occasional    There were no vitals filed for this visit.  Physical Exam  GENERAL APPEARANCE: Alert, conversant. No acute distress.  HEENT: Unremarkable. RESPIRATORY: Breathing is even, unlabored. Lung sounds are clear   CARDIOVASCULAR: Heart RRR no murmurs, rubs or gallops. No peripheral edema.  GASTROINTESTINAL: Abdomen is soft, non-tender, not distended w/ normal bowel sounds.  NEUROLOGIC: Cranial nerves 2-12 grossly intact. Moves all extremities  Patient Active Problem List   Diagnosis Date Noted  . Jaundice   . Reflux esophagitis   . Pancreatic lesion   . Abnormal transaminases   . Normochromic normocytic anemia   . Pyriform sinus mass   . Melena   . Ascites   . Cirrhosis of liver with ascites ([place]) 03/14/2016  . Liver failure ([name]) 03/14/2016  . Anemia 03/14/2016  . Respiratory failure with hypoxia ([place]) 03/14/2016  . Lesion of pancreas   . Generalized abdominal pain   . Hyponatremia   . Abscess of skin of abdomen 01/13/2016  . Leg cramping 01/13/2016  . Abnormal LFTs (liver function tests) 10/13/2015  . Alcohol use ([place]) 10/13/2015  . Left knee pain 02/04/2015  . Encounter for general adult medical examination with abnormal findings 12/25/2014  . Medicare annual wellness visit, subsequent 12/25/2014  . Knee pain 12/08/2014  . Chronic pancreatitis ([place]) 05/03/2011  . Nausea and vomiting 04/24/2011  . Rhinorrhea 04/24/2011  . Gastroenteritis 10/20/2010  . PREMATURE VENTRICULAR CONTRACTIONS 07/07/2010  . HYPERKALEMIA 06/29/2010  . ACUTE GOUTY ARTHROPATHY 06/28/2010  . HYPERTENSION, BENIGN ESSENTIAL 06/28/2010    CBC    Component Value Date/Time   WBC 9.5 03/19/2016 0411   WBC 9.5 03/19/2016   RBC 3.41* 03/19/2016 0411   RBC 2.50* 03/14/2016 0736   HGB 10.5* 03/19/2016 0411   HCT 32.5* 03/19/2016 0411   PLT 127* 03/19/2016 0411    MCV 95.3 03/19/2016 0411   LYMPHSABS 0.8 03/16/2016 0432   MONOABS 0.8 03/16/2016 0432   EOSABS 0.0 03/16/2016 0432   BASOSABS 0.0 03/16/2016 0432    CMP     Component Value Date/Time   NA 125* 03/19/2016  0411   NA 125* 03/19/2016   K 3.9 03/19/2016 0411   CL 91* 03/19/2016 0411   CO2 25 03/19/2016 0411   GLUCOSE 97 03/19/2016 0411   BUN 14 03/19/2016 0411   BUN 14 03/19/2016   CREATININE 0.69 03/19/2016 0411   CREATININE 0.7 03/19/2016   CREATININE 0.91 05/02/2011 1150   CALCIUM 8.1* 03/19/2016 0411   PROT 5.5* 03/18/2016 0441   ALBUMIN 2.6* 03/18/2016 0441   AST 136* 03/18/2016 0441   ALT 38 03/18/2016 0441   ALKPHOS 492* 03/18/2016 0441   BILITOT 6.3* 03/18/2016 0441   GFRNONAA >60 03/19/2016 0411   GFRAA >60 03/19/2016 0411    Assessment and Plan  Pt is d/c to home with HH//PT/Nursing/aid/SW. The pt is complicated. He needs nursing to follow for weight gain and skin turgor over abdomen 2/2 ascites, pt was drained of 5 liters in hospital.. Pt has protein malnutrition , he will need calorie counts. Pt had multiple electrolyte abnormalities, he will need BMP/CMP drawn to follow. Pt presented with Hb 7 and dx with esophagitis and gastric ulcer,was transfused in hospital but  will need follow CBC's. He is on lactulose , his MS needs following and whether pt is having loose stool daily . Pt with ETOH abuse , nursing and aids can monitor drinking, ideally none,  and ETOH in the home. SW can get him set up with AA and help facilitate other supports if available.   Medications have been reconciled and Rx's written.  No problem-specific assessment & plan notes found for this encounter.   Hennie Duos, MD    This encounter was created in error - please disregard.

## 2016-03-24 ENCOUNTER — Non-Acute Institutional Stay (SKILLED_NURSING_FACILITY): Payer: Medicare HMO | Admitting: Internal Medicine

## 2016-03-24 DIAGNOSIS — E871 Hypo-osmolality and hyponatremia: Secondary | ICD-10-CM | POA: Diagnosis not present

## 2016-03-24 DIAGNOSIS — E43 Unspecified severe protein-calorie malnutrition: Secondary | ICD-10-CM | POA: Diagnosis not present

## 2016-03-24 DIAGNOSIS — R220 Localized swelling, mass and lump, head: Secondary | ICD-10-CM

## 2016-03-24 DIAGNOSIS — K869 Disease of pancreas, unspecified: Secondary | ICD-10-CM

## 2016-03-24 DIAGNOSIS — K2100 Gastro-esophageal reflux disease with esophagitis, without bleeding: Secondary | ICD-10-CM

## 2016-03-24 DIAGNOSIS — K7031 Alcoholic cirrhosis of liver with ascites: Secondary | ICD-10-CM | POA: Diagnosis not present

## 2016-03-24 DIAGNOSIS — J9601 Acute respiratory failure with hypoxia: Secondary | ICD-10-CM | POA: Diagnosis not present

## 2016-03-24 DIAGNOSIS — J189 Pneumonia, unspecified organism: Secondary | ICD-10-CM | POA: Diagnosis not present

## 2016-03-24 DIAGNOSIS — E876 Hypokalemia: Secondary | ICD-10-CM | POA: Diagnosis not present

## 2016-03-24 DIAGNOSIS — D500 Iron deficiency anemia secondary to blood loss (chronic): Secondary | ICD-10-CM | POA: Diagnosis not present

## 2016-03-24 DIAGNOSIS — Z7289 Other problems related to lifestyle: Secondary | ICD-10-CM

## 2016-03-24 DIAGNOSIS — J3489 Other specified disorders of nose and nasal sinuses: Secondary | ICD-10-CM

## 2016-03-24 DIAGNOSIS — Z789 Other specified health status: Secondary | ICD-10-CM | POA: Diagnosis not present

## 2016-03-26 ENCOUNTER — Encounter: Payer: Self-pay | Admitting: Internal Medicine

## 2016-03-26 DIAGNOSIS — G119 Hereditary ataxia, unspecified: Secondary | ICD-10-CM | POA: Insufficient documentation

## 2016-03-26 DIAGNOSIS — M109 Gout, unspecified: Secondary | ICD-10-CM | POA: Insufficient documentation

## 2016-03-26 DIAGNOSIS — R531 Weakness: Secondary | ICD-10-CM | POA: Insufficient documentation

## 2016-03-28 ENCOUNTER — Telehealth: Payer: Self-pay | Admitting: Emergency Medicine

## 2016-03-28 ENCOUNTER — Telehealth: Payer: Self-pay | Admitting: Family

## 2016-03-28 NOTE — Telephone Encounter (Signed)
Do we have paperwork for this?

## 2016-03-28 NOTE — Telephone Encounter (Signed)
[facility] needs PT orders for pt. 2 time 2wk, 1 time for 3 wks. Please advise thanks.

## 2016-03-28 NOTE — Telephone Encounter (Signed)
[facility]

## 2016-03-28 NOTE — Telephone Encounter (Signed)
States patient was just released from Rehab.  Needs to know if Sharlet Salina is going to sign off on Home Health?

## 2016-03-28 NOTE — Telephone Encounter (Signed)
Prescription written

## 2016-03-28 NOTE — Telephone Encounter (Signed)
Suanne Marker called to request an order be faxed to [facility] for the patients nebulizer. He has been without all weekend. She is requesting asap

## 2016-03-29 ENCOUNTER — Encounter: Payer: Self-pay | Admitting: Radiation Oncology

## 2016-03-29 NOTE — Progress Notes (Addendum)
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 [name] on 03/15/16. However, he was admitted to the ED on 03/14/16 for ascites.  Biopsies of left pyriform sinus (if applicable) revealed:  FINAL DIAGNOSIS Diagnosis Pyriform sinus, biopsy, left INVASIVE SQUAMOUS CELL CARCINOMA Microscopic Comment Immunostains for p16 is negative  Nutrition Status Yes No Comments  Weight changes? [x]  []  15lb weight loss in the last month  Swallowing concerns? [x]  []  Dysphagia for solids but not liquids  PEG? []  [x]     Referrals Yes No Comments  Social Work? [x]  []  Alvis Lemmings [name])  Dentistry? []  [x]  Has partials (that don't fit)  Swallowing therapy? [x]  []  [facility]  Nutrition? []  [x]    Med/Onc? []  [x]     Safety Issues Yes No Comments  Prior radiation? []  [x]  Recently diagnosed with prostate ca. Also, has a pancreatic mass  Pacemaker/ICD? []  [x]    Possible current pregnancy? []  [x]    Is the patient on methotrexate? []  [x]     Tobacco/Marijuana/Snuff/ETOH use: Reports he stopped smoking following hospital discharge. He has never used smokeless tobacco. He reports drinking about 16.8 ox of alcohol per week. Reports cocaine abuse over thirty years ago. Past/Anticipated interventions by otolaryngology, if any: EGD done by [name] on 03/15/16. Followed by Dr. [name].  Past/Anticipated interventions by medical oncology, if any: no     Current Complaints / other details:  55 year old male. Returns today for further discussion of treatment options since biopsy results confirmed cancer. No PET results found and nothing scheduled. Significant other reports the patient is eating very well and supplementing with Ensure, Boost and [place] at least three times per day.  [place] visits 2-3 times per week, aide 2 times per week, and  PT twice per week. Thick ropey saliva noted. Cough intermittently productive with white sputum. Reports nausea first thing in the morning without vomiting. Reports dizziness early in the morning. 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 Reports he had a prostate biopsy two weeks ago. Calling [facility] to fax report.  Reports low abdominal pain worse at night. Presently pain 8 on a scale of 0-10. Reports taking flexeril for pain but, no much relief is felt. Chief Complaint  Patient presents with  . Cancer    Follow up new/Pyriform sinus and pancreatic mass/Dr. Manning/Referral from Dr. Jeanann Lewandowsky. Erlinda Hong

## 2016-03-29 NOTE — Telephone Encounter (Signed)
Gave verbal ok per Greg. 

## 2016-03-29 NOTE — Telephone Encounter (Signed)
Called [place] from [place] back and asked if there was a paper that needed to be signed. She stated that she just needed a verbal from a doctor. She said it was ok that she goes by what Adam Marshall says just needs a doctors say. Please advise if you are ok with this.

## 2016-03-29 NOTE — Telephone Encounter (Signed)
LVM letting Adam Marshall know

## 2016-03-29 NOTE — Telephone Encounter (Signed)
Fine with me

## 2016-03-30 ENCOUNTER — Telehealth: Payer: Self-pay | Admitting: Emergency Medicine

## 2016-03-30 ENCOUNTER — Encounter: Payer: Self-pay | Admitting: Radiation Oncology

## 2016-03-30 ENCOUNTER — Ambulatory Visit
Admission: RE | Admit: 2016-03-30 | Discharge: 2016-03-30 | Disposition: A | Discharge status: Home / Self Care | Payer: Medicare HMO | Source: Ambulatory Visit | Attending: Radiation Oncology | Admitting: Radiation Oncology

## 2016-03-30 ENCOUNTER — Other Ambulatory Visit: Payer: Self-pay | Admitting: Radiation Oncology

## 2016-03-30 VITALS — BP 89/64 | HR 91 | Temp 98.4°F | Resp 18 | Wt 107.6 lb

## 2016-03-30 DIAGNOSIS — R220 Localized swelling, mass and lump, head: Secondary | ICD-10-CM

## 2016-03-30 DIAGNOSIS — C12 Malignant neoplasm of pyriform sinus: Secondary | ICD-10-CM

## 2016-03-30 DIAGNOSIS — C61 Malignant neoplasm of prostate: Secondary | ICD-10-CM | POA: Diagnosis not present

## 2016-03-30 DIAGNOSIS — Z510 Encounter for antineoplastic radiation therapy: Secondary | ICD-10-CM | POA: Diagnosis present

## 2016-03-30 DIAGNOSIS — J3489 Other specified disorders of nose and nasal sinuses: Secondary | ICD-10-CM

## 2016-03-30 DIAGNOSIS — C139 Malignant neoplasm of hypopharynx, unspecified: Secondary | ICD-10-CM | POA: Insufficient documentation

## 2016-03-30 DIAGNOSIS — C319 Malignant neoplasm of accessory sinus, unspecified: Secondary | ICD-10-CM | POA: Diagnosis present

## 2016-03-30 DIAGNOSIS — R2689 Other abnormalities of gait and mobility: Secondary | ICD-10-CM

## 2016-03-30 HISTORY — DX: Malignant (primary) neoplasm, unspecified: C80.1

## 2016-03-30 HISTORY — DX: Malignant neoplasm of prostate: C61

## 2016-03-30 MED ORDER — LARYNGOSCOPY SOLUTION RAD-ONC
15.0000 mL | Freq: Once | TOPICAL | Status: AC
  Administered 2016-03-30: 15 mL via TOPICAL
  Filled 2016-03-30: qty 15

## 2016-03-30 NOTE — Progress Notes (Signed)
Radiation Oncology         [phone] ________________________________  Name: Adam Marshall MRN: [id number]  Date: 03/30/2016  DOB: 1961-07-11  Follow-Up Visit Note  CC: [name], FNP  [name], FNP  Diagnosis: 55 y.o. 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 Malignant neoplasm of pyriform sinus (HCC) 148.1 C12   2. 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 Malignant neoplasm of prostate (HCC) 185 C61     Interval Since Last Radiation: N/A  Narrative:  The patient returns today for routine follow-up. He was kindly referred to [place] for treatment of a left pyriform sinus lesion found on an upper GI endoscopy by Dr. [name] on 03/15/2016. The patient's initial consultation with Dr. [name] was on 03/18/16 during hospitalization. He was originally admitted to the ED on 03/14/2016 for ascites due to alcoholic cirrhosis. Ultrasound guided diagnostic and therapeutic paracentesis on 03/14/16 yielded approximately 3 liters of peritoneal fluid. Another paracentesis performed on 03/16/16 removed approximately 2.6 liters of peritoneal fluid. CT scan of the abdomen and pelvis on 03/14/16 revealed hepatic cirrhosis and fatty infiltration with recanalization of the periumbilical veins, ascites, bilateral pleural effusions with basilar atelectasis, and a 1.5 cm low-attenuation lesion in the uncinate process of the pancreas. The patient had an EGD by Dr. [name] on 03/15/2016 for melena and during the procedure, he noted a friable left pyriform sinus lesion with apparent airway impingement. CT of the neck with contrast on 03/16/16 showed no mucosal or submucosal disease, with specific attention to the left piriform sinus, and no lymphadenopathy. The patient underwent a direct laryngoscopy with bronchoscopy and esophagoscopy  with biopsy on [id number] by Dr. [name]. The biopsy revealed invasive squamous cell carcinoma and immunostains for p16 is negative. Dr. [name] suspects T1 N0 pyriform sinus malignancy based on the sessile 10 x 20 mm pyriform sinus lesion.  The patient also has a current personal history of prostate cancer which was diagnosed just prior to his admission. The patient presented to Dr. [name] on 11/26/15 by his PCP, Dr. [name], for an elevated PSA of 7.61 measured in 10/2015. The prostate had no palpable nodularity. On 02/08/16, the patient had a 12 core biopsy of the prostate. Gleason score 3+3 was in 5 cores, and Gleason 3+4 in 1 core. The path report is pending authorization of release. The volume of the prostate is 16 cc.  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 He has not been seen by medical oncology, or had a staging PET scan.   On review of systems, the patient reports that he is not doing well overall. The patient reports a 40 lb unintended weight loss in the past year, dysphagia for solids (but not liquids), an intermittent productive cough with white sputum, nausea in the morning, and dizziness in the morning. He denies emesis and supplements his nutrition with Ensure and Boost. In regards to his urinary symptoms, he has nocturia x3-4, weak urinary stream, hesitancy, intermittency, incomplete voiding, and ED. He reports hematuria after his prostate biopsy, but it has since stopped. The patient reports pain in the right deep cervical chain when sleeping and abdominal pain. The patient has loose bowels. A complete review of systems is obtained and is otherwise negative.   ALLERGIES:  is allergic to penicillins.  Meds: Current Outpatient Prescriptions  Medication Sig Dispense Refill  . albuterol (PROVENTIL) (2.5 MG/3ML) 0.083% nebulizer solution Take 3 mLs (2.5 mg total) by nebulization every 6 (six) hours as needed for wheezing or shortness of breath. 75 mL 12    . allopurinol (ZYLOPRIM) 100 MG tablet Take 1 tablet (100 mg total) by mouth daily. 30 tablet 1  . aspirin 81 MG chewable tablet Chew 81 mg by mouth daily.    . colchicine 0.6 MG tablet Take 1 tablet (0.6 mg total) by mouth daily. 30 tablet 2  . cyclobenzaprine (FLEXERIL) 5 MG tablet Take 1 tablet (5 mg total) by mouth 3 (three) times daily as needed for muscle spasms. 60 tablet 1  . feeding supplement, ENSURE ENLIVE, (ENSURE ENLIVE) LIQD Take 237 mLs by mouth 3 (three) times daily between meals. 237 mL 12  . fluticasone (FLONASE) 50 MCG/ACT nasal spray Place 1 spray into the nose daily.    . folic acid (FOLVITE) 1 MG tablet Take 1 tablet (1 mg total) by mouth daily. 90 tablet 1  . furosemide (LASIX) 40 MG tablet Take 1 tablet (40 mg total) by mouth daily. 30 tablet 0  . guaiFENesin (MUCINEX) 600 MG 12 hr tablet Take 1 tablet (600 mg total) by mouth 2 (two) times daily. 30 tablet 0  . lactulose (CEPHULAC) 10 g packet Take 1 packet (10 g total) by mouth daily. 30 each 0  . Lifitegrast (XIIDRA) 5 % SOLN Apply 1 drop to eye 2 (two) times daily.    . mupirocin cream (BACTROBAN) 2 % Apply 1 application topically 2 (two) times daily. 15 g 0  . pantoprazole (PROTONIX) 40 MG tablet Take 1 tablet (40 mg total) by mouth 2 (two) times daily before a meal. 60 tablet 0  . propranolol (INDERAL) 10 MG tablet Take 1 tablet (10 mg total) by mouth 2 (two) times daily. 60 tablet 0  . RA VITAMIN B-1 100 MG TABS Take 1 tablet by mouth daily.  0  . sodium chloride (OCEAN) 0.65 % SOLN nasal spray Place 2 sprays into both nostrils 2 (two) times daily. 1 Bottle 0  . spironolactone (ALDACTONE) 25 MG tablet Take 1 tablet (25 mg total) by mouth daily. (Patient not taking: Reported on 03/30/2016) 30 tablet 0   No current facility-administered medications for this encounter.    Past Medical History:  Past Medical History:  Diagnosis Date  . Abnormal LFTs   . Cancer ([place])    invasive squamous cell carcinoma of pyriform  sinus  . Chronic back pain   . Chronic knee pain    left  . Fatty liver   . Gout   . Hypertension   . Pancreatic mass   . Pancreatitis   . Prostate cancer [facility])     Past Surgical History: Past Surgical History:  Procedure Laterality Date  . ESOPHAGOGASTRODUODENOSCOPY N/A 03/15/2016   Procedure: ESOPHAGOGASTRODUODENOSCOPY (EGD);  Surgeon: Doran Stabler, MD;  Location: [facility];  Service: Endoscopy;  Laterality: N/A;  . KNEE SURGERY     left  . PANENDOSCOPY N/A 03/18/2016   Procedure: DIRECT LARYNGOSCOPY, WITH BRONCHOSCOPY, AND ESOPHAGOSCOPY, WITH BIOPSY;  Surgeon: [name], MD;  Location: [facility];  Service: ENT;  Laterality: N/A;  . PROSTATE BIOPSY      Social History:  Social History   Social History  . Marital status: Single    Spouse name: N/A  . Number of children: 1  . Years of education: 23   Occupational  History  . Disability     Knee / Back   Social History Main Topics  . Smoking status: Former Smoker    Packs/day: 0.30    Years: 30.00    Types: Cigarettes    Quit date: 03/19/2016  . Smokeless tobacco: Never Used  . Alcohol use 72.0 oz/week    84 Cans of beer, 36 Shots of liquor per week     Comment: The patient drank 12 pack of beer a day and 1 pint of liquor every other day. He stopped on 03/14/16 when he was admitted to the hospital.  . Drug use: No     Comment: Hx of cocaine abuse over 30 years ago  . Sexual activity: Yes   Other Topics Concern  . Not on file   Social History Narrative   Born and raised in [place], [place]. Fun: Stay home and watch TV.    Denies religious beliefs that would effect health care.     Family History: Family History  Problem Relation Age of Onset  . Diabetes Mother   . Diabetes Maternal Grandmother   . Cancer Brother 57    throat     Physical Findings:  weight is 107 lb 9.6 oz (48.8 kg). His oral temperature is 98.4 F (36.9 C). His blood pressure is 89/64 (abnormal) and his pulse is 91. His  respiration is 18.   In general this is a cachetic and chronically ill appearing African-American male in no acute distress. He is alert and oriented x4 and appropriate throughout the examination. HEENT reveals that the patient is normocephalic, atraumatic. Temporal wasting noted. EOMs are intact. PERRLA. Skin is intact without any evidence of gross lesions. Cardiovascular exam reveals a regular rate and rhythm, no clicks rubs or murmurs are auscultated. Chest is clear to auscultation bilaterally. Lymphatic assessment is performed and does not reveal any adenopathy in the cervical, supraclavicular, or axillary chains. Abdomen is soft, non-tender, but moderately distended without stigmata of cirrhosis. Normo active bowel sounds in all quadrants and is intact. Lower extremities are negative for pretibial pitting edema, deep calf tenderness, cyanosis or clubbing.  PROCEDURE NOTE: After anesthetizing the nasal cavity with topical lidocaine and phenylephrine, the flexible endoscope was introduced and passed through the right nasal cavity. Visualized the nasopharynx and pharynx. All structures normal except for a pale lesion in the left pyriform sinus. Digital pictures were obtained, and will be added to his chart.  Lab Findings: Lab Results  Component Value Date   WBC 9.5 03/19/2016   HGB 10.5 (L) 03/19/2016   HCT 32.5 (L) 03/19/2016   PLT 127 (L) 03/19/2016    Lab Results  Component Value Date   NA 125 (L) 03/19/2016   NA 125 (A) 03/19/2016   K 3.9 03/19/2016   CO2 25 03/19/2016   GLUCOSE 97 03/19/2016   BUN 14 03/19/2016   BUN 14 03/19/2016   CREATININE 0.69 03/19/2016   CREATININE 0.91 05/02/2011   BILITOT 6.3 (H) 03/18/2016   ALKPHOS 492 (H) 03/18/2016   AST 136 (H) 03/18/2016   ALT 38 03/18/2016   PROT 5.5 (L) 03/18/2016   ALBUMIN 2.6 (L) 03/18/2016   CALCIUM 8.1 (L) 03/19/2016   ANIONGAP 9 03/19/2016    Radiographic Findings: Dg Chest 2 View  Result Date: 03/14/2016 CLINICAL  DATA:  55 year old male with abdominal pain EXAM: CHEST  2 VIEW COMPARISON:  Chest radiograph dated 08/04/2004 FINDINGS: There is a shallow inspiration. Focal area of opacity at the right lung base  posteriorly may represent atelectasis or pneumonia. A small right pleural effusion may be present. There is no pneumothorax. The cardiac silhouette is within normal limits. No acute osseous pathology. IMPRESSION: Focal right lung base atelectasis versus infiltrate. Clinical correlation and follow-up recommended. Electronically Signed   By: Anner Crete M.D.   On: 03/14/2016 02:39   Ct Soft Tissue Neck W Contrast  Result Date: 03/16/2016 CLINICAL DATA:  Left piriform sinus mass lesion with airway impingement. EXAM: CT NECK WITH CONTRAST TECHNIQUE: Multidetector CT imaging of the neck was performed using the standard protocol following the bolus administration of intravenous contrast. CONTRAST:  47mL ISOVUE-300 IOPAMIDOL (ISOVUE-300) INJECTION 61% COMPARISON:  None. FINDINGS: Pharynx and larynx: No mucosal or submucosal mass is discernible by CT, with specific attention to the piriform sinuses. Endoscopy may be more sensitive to mucosal disease. Salivary glands: Submandibular and parotid glands are normal. Thyroid: Normal Lymph nodes: No enlarged or low-density nodes on either side of the neck. Vascular: Extensive atherosclerotic disease at both carotid bifurcations. This study was not done in the form of a CT angiogram, but stenosis is estimated at 20% in the right ICA and 50% in the left ICA. Venous structures appear normal. Limited intracranial: Negative Visualized orbits: Normal Mastoids and visualized paranasal sinuses: Clear Skeleton: Ordinary cervical spondylosis Upper chest: Advanced emphysema. Areas of interstitial density in both upper lungs most likely representing scarring. Pneumonia not excluded. Small layering effusions bilaterally. IMPRESSION: No mucosal or submucosal disease is identified, with  specific attention to the left piriform sinus. Endoscopy would be more sensitive for mucosal disease. No lymphadenopathy. Aortic atherosclerosis. Carotid artery atherosclerosis. 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 Emphysema. Pulmonary scarring and/or upper lobe infiltrates. Small pleural effusions. Electronically Signed   By: Nelson Chimes M.D.   On: 03/16/2016 17:51   Ct Abdomen Pelvis W Contrast  Result Date: 03/14/2016 CLINICAL DATA:  Left upper quadrant and periumbilical pain for 2 days. Abdominal distention. Nausea and vomiting. Elevated white cell count. Recent diagnosis of prostate cancer. EXAM: CT ABDOMEN AND PELVIS WITH CONTRAST TECHNIQUE: Multidetector CT imaging of the abdomen and pelvis was performed using the standard protocol following bolus administration of intravenous contrast. CONTRAST:  52mL ISOVUE-300 IOPAMIDOL (ISOVUE-300) INJECTION 61% COMPARISON:  None. FINDINGS: Small bilateral pleural effusions with basilar atelectasis, greater on the left. Emphysematous changes and fibrosis in the lung bases. Coronary artery calcifications. Small esophageal hiatal hernia. Diffuse fatty infiltration of the liver. Sub cm low-attenuation lesions in the liver are too small to characterize but likely represent cysts. Recanalization of the periumbilical veins. Calcifications in the left lobe of the liver, likely dystrophic. Probable ectatic cirrhosis with enlarged lateral segment left lobe and mild nodularity to the liver contour. Spleen size is normal. Moderate amount of free fluid throughout the abdomen and pelvis, likely representing ascites. Cystic lesion in the uncinate process of the pancreas measuring 1.5 cm diameter. Suggest follow-up in 1 year with MRI. Left adrenal gland nodule measuring 1 cm diameter. This is indeterminate but statistically likely represents an adenoma. Scattered lymph nodes in the retroperitoneum are not pathologically enlarged. Diffuse calcification of abdominal aorta  without aneurysm. Inferior vena cava and kidneys are unremarkable. Stomach, small bowel, and colon are not abnormally distended. No free air in the abdomen. Pelvis: Diverticula in the sigmoid colon without evidence of diverticulitis. Prostate gland is enlarged at 4.1 cm diameter. Bladder is mostly decompressed. No pelvic mass or lymphadenopathy. Appendix is normal. Degenerative changes in the spine and hips. No destructive bone  lesions. IMPRESSION: Hepatic cirrhosis and fatty infiltration with recanalization of the periumbilical veins. Moderate diffuse fluid throughout the abdomen and pelvis likely due to ascites. Bilateral pleural effusions with basilar atelectasis. 1.5 cm low-attenuation lesion in the uncinate process of the pancreas. Suggest follow-up MRI in 1 year. Electronically Signed   By: Lucienne Capers M.D.   On: 03/14/2016 05:23   US Paracentesis  Result Date: 03/16/2016 INDICATION: Cirrhosis, recurrent ascites. Request made for therapeutic paracentesis. EXAM: ULTRASOUND GUIDED THERAPEUTIC PARACENTESIS MEDICATIONS: None. COMPLICATIONS: None immediate. PROCEDURE: Informed written consent was obtained from the patient after a discussion of the risks, benefits and alternatives to treatment. A timeout was performed prior to the initiation of the procedure. Initial ultrasound scanning demonstrates a moderate amount of ascites within the left lower abdominal quadrant. The left lower abdomen was prepped and draped in the usual sterile fashion. 1% lidocaine was used for local anesthesia. Following this, a Yueh catheter was introduced. An ultrasound image was saved for documentation purposes. The paracentesis was performed. The catheter was removed and a dressing was applied. The patient tolerated the procedure well without immediate post procedural complication. FINDINGS: A total of approximately 2.6 liters of yellow fluid was removed. IMPRESSION: Successful ultrasound-guided therapeutic paracentesis  yielding 2.6 liters of peritoneal fluid. Read by: Rowe Robert, PA-C Electronically Signed   By: Jerilynn Mages.  Shick M.D.   On: 03/16/2016 14:58   US Paracentesis  Result Date: 03/14/2016 INDICATION: Cirrhosis, ascites. Request made for diagnostic and therapeutic paracentesis. EXAM: ULTRASOUND GUIDED DIAGNOSTIC AND THERAPEUTIC PARACENTESIS MEDICATIONS: None. COMPLICATIONS: None immediate. PROCEDURE: Informed written consent was obtained from the patient after a discussion of the risks, benefits and alternatives to treatment. A timeout was performed prior to the initiation of the procedure. Initial ultrasound scanning demonstrates a moderate amount of ascites within the left lower abdominal quadrant. The left lower abdomen was prepped and draped in the usual sterile fashion. 1% lidocaine was used for local anesthesia. Following this, a Yueh catheter was introduced. An ultrasound image was saved for documentation purposes. The paracentesis was performed. The catheter was removed and a dressing was applied. The patient tolerated the procedure well without immediate post procedural complication. FINDINGS: A total of approximately 3 liters of clear, yellow fluid was removed. Samples were sent to the laboratory as requested by the clinical team. IMPRESSION: Successful ultrasound-guided diagnostic and therapeutic paracentesis yielding 3 liters of peritoneal fluid. Read by: Rowe Robert, PA-C Electronically Signed   By: Lucrezia Europe M.D.   On: 03/14/2016 16:58   US Abdomen Limited Ruq  Result Date: 03/14/2016 CLINICAL DATA:  History of cirrhosis.  Abnormal transaminases. EXAM: US ABDOMEN LIMITED - RIGHT UPPER QUADRANT COMPARISON:  Abdominal ultrasound 10/20/2015 FINDINGS: Gallbladder: The gallbladder is normally distended. No gallbladder stones or sludge are seen. There is thickening and edema of the gallbladder wall measuring up to 4.6 mm in cross-section. There is a tiny amount of pericholecystic fluid, however small amount  of ascites is noted in perihepatic location as well. The sonographic Murphy's sign was reported as negative. Common bile duct: Diameter: Normal in diameter measuring 5 mm, where visualized. Liver: No focal lesion identified. The liver demonstrates nodular contour and diffusely increased echogenicity. There is a small amount of perihepatic ascites. IMPRESSION: Normally distended gallbladder with edematous wall measuring up to 4.6 mm. In the settings of liver cirrhosis this may represent reactive inflammatory changes, however acalculous cholecystitis cannot be excluded. Notably, the sonographic Murphy's sign was negative. Cirrhotic appearance of the liver was small volume of  abdominal ascites. Electronically Signed   By: Fidela Salisbury M.D.   On: 03/14/2016 17:44    Impression: 55 y.o. 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 [name] spoke with the patient and family about the findings and work-up thus far.  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  We discussed the available radiation techniques, and focused on the details of logistics and delivery.  We reviewed the anticipated acute and late sequelae associated with radiation in this setting.   We will proceed with a PET scan as additional staging work-up. The patient will return to discuss the results and begin radiation planning with CT simulation. The patient will be referred to Dr. Enrique Sack for a dental consultation. He will also be referred to nutrition and speech therapy.  In regards to the patient's prostate cancer, we will obtain his records and determine a course of action once plans have been finalized for his hypopharyngeal cancer.  We spent 60 minutes  minutes face to face with the patient and more than 50% of that time was spent in counseling and/or coordination of care.  _____________________________________  Carola Rhine, PAC  This document serves as a record of services personally performed by Shona Simpson, PA-C and [name]. It was created on their behalf by Darcus Austin, a trained medical scribe. The creation of this record is based on the scribe's personal observations and the providers' statements to them. This document has been checked and approved by the attending provider.

## 2016-03-30 NOTE — Telephone Encounter (Signed)
Is this something that was ordered or requested during the most recent hospitalization? I have not seen him for a wheelchair and reading through the hospital notes and skilled nursing facility and cannot find any recommendations by physical therapy for wheelchair. Does home health PT recommend?

## 2016-03-30 NOTE — Telephone Encounter (Signed)
Pt is still waiting on an order for his wheelchair from [facility]. Please follow up thanks.

## 2016-03-30 NOTE — Progress Notes (Signed)
Dr. [name] requested this RN download picture from usb driver taking by Liliane Channel, RN during laryngoscope. No pictures found. Scribe, Jallaal, check behind this nurse using his computer and found that the driver had no images.

## 2016-03-30 NOTE — Telephone Encounter (Signed)
Please advise on wheelchair. No note indicating we ordered one.

## 2016-03-30 NOTE — Progress Notes (Signed)
See progress note under physician encounter. 

## 2016-03-31 ENCOUNTER — Encounter: Payer: Self-pay | Admitting: *Deleted

## 2016-03-31 NOTE — Progress Notes (Signed)
  Oncology Nurse Navigator Documentation  Met with Adam Marshall during consult with Dr. [name].  He was accompanied by his SO.    1. Introduced myself as his Navigator, explained my role as a member of the Care Team.   2. Provided New Patient Information packet, discussed contents:  Contact information for physician(s), myself, other members of the Care Team.  Advance Directive information ([place] blue pamphlet with LCSW contact info), provided a copy of [place] AD forms.  Fall Prevention Patient Safety Plan  Appointment Guideline  Financial Assistance Information sheet  [place] with highlight of [place] 3. Provided introductory explanation of radiation treatment including SIM planning and purpose of Aquaplast head and shoulder mask, showed them example.   4. Provided and discussed education handout for PEG.   5. We discussed his attendance at the 04/12/2016 [facility]. 6. I encouraged them to contact me with questions/concerns as treatments/procedures begin.  They verbalized understanding of information provided.    Gayleen Orem, RN, BSN, [place] at [facility] [phone]    Navigator Location: [place] [phone]/26/17 0830) Navigator Encounter Type: Initial RadOnc (03/30/16 0830)               Barriers/Navigation Needs: Education (03/30/16 0830) Education: Newly Diagnosed Cancer Education (03/30/16 0830) Interventions: Coordination of Care (03/30/16 0830)            Acuity: Level 2 (03/30/16 0830)   Acuity Level 2: Educational needs;Initial guidance, education and coordination as needed;Other (03/30/16 0830)     Time Spent with Patient: > 120 (03/30/16 0830)

## 2016-04-05 ENCOUNTER — Telehealth: Payer: Self-pay | Admitting: *Deleted

## 2016-04-05 ENCOUNTER — Ambulatory Visit: Payer: Medicare HMO | Admitting: Physical Therapy

## 2016-04-05 ENCOUNTER — Encounter: Payer: Self-pay | Admitting: *Deleted

## 2016-04-05 ENCOUNTER — Encounter (HOSPITAL_COMMUNITY): Payer: Self-pay | Admitting: Dentistry

## 2016-04-05 ENCOUNTER — Ambulatory Visit (HOSPITAL_COMMUNITY): Payer: Self-pay | Admitting: Dentistry

## 2016-04-05 VITALS — BP 106/72 | HR 97 | Temp 97.9°F

## 2016-04-05 DIAGNOSIS — K0530 Chronic periodontitis, unspecified: Secondary | ICD-10-CM

## 2016-04-05 DIAGNOSIS — K08409 Partial loss of teeth, unspecified cause, unspecified class: Secondary | ICD-10-CM

## 2016-04-05 DIAGNOSIS — C12 Malignant neoplasm of pyriform sinus: Secondary | ICD-10-CM | POA: Diagnosis not present

## 2016-04-05 DIAGNOSIS — K0850 Unsatisfactory restoration of tooth, unspecified: Secondary | ICD-10-CM

## 2016-04-05 DIAGNOSIS — M264 Malocclusion, unspecified: Secondary | ICD-10-CM

## 2016-04-05 DIAGNOSIS — K0889 Other specified disorders of teeth and supporting structures: Secondary | ICD-10-CM

## 2016-04-05 DIAGNOSIS — Z01818 Encounter for other preprocedural examination: Secondary | ICD-10-CM

## 2016-04-05 DIAGNOSIS — IMO0002 Reserved for concepts with insufficient information to code with codable children: Secondary | ICD-10-CM

## 2016-04-05 DIAGNOSIS — K036 Deposits [accretions] on teeth: Secondary | ICD-10-CM

## 2016-04-05 NOTE — Telephone Encounter (Signed)
Called pt back and wife answered the phone. Advised that pt was just dx with prostate cancer and throat cancer and is getting very weak and having go to a lot of appointment. She is ill herself and states pt really needs a wheel chair to get in and out of appointments.

## 2016-04-05 NOTE — Telephone Encounter (Signed)
Lattie Haw from Dental Medicine needs patient's port picture faxed to their office , they didn't receive anything from our office  yesterday , she spoke with Enid Derry and again in the afternoon with Dr. Johny Shears nurse she believes, I will give this message to Enid Derry who will be in at [place], Their fax Number is: [phone] 7:48 AM

## 2016-04-05 NOTE — Patient Instructions (Signed)

## 2016-04-05 NOTE — Telephone Encounter (Signed)
CALLED PATIENT TO INFORM OF PET SCAN FOR 04-11-16 - ARRIVAL TIME - 1:30 PM @ [facility], NPO 6 HRS. PRIOR TO TEST AND HIS [facility]  APPT. WITH ALISON PERKINS ON 04-12-16 AFTER [place] APPTS., SPOKE WITH PATIENT AND HE IS AWARE OF THESE APPTS.

## 2016-04-05 NOTE — Progress Notes (Signed)
DENTAL CONSULTATION  Date of Consultation:  04/05/2016 Patient Name:   Adam Marshall Date of Birth:   1961/06/21 Medical Record Number: [id number]  VITALS: BP 106/72   Pulse 97   Temp 97.9 F (36.6 C) (Oral)   CHIEF COMPLAINT: Patient referred by Dr. [name] for dental consultation.   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 Patient with anticipated radiation therapy with Dr. [name]. Patient is now seen as part of a medically necessary preradiation therapy dental protocol examination.  Patient currently denies acute toothaches, swellings, or abscesses. Patient does have a history of some tooth sensitivity involving an upper right premolar approximately one to 2 months ago.  He described a dull, achy pain that lasted "for minutes" only. Patient indicated that the pain reached an intensity of 7 out of 10 by patient report but is currently 0 out of 10 in intensity.  Patient indicates that he last saw his dentist, Dr. Lovena Neighbours, approximately 2 years ago for a cleaning. Patient indicates that he does have upper lower partial dentures but he left them at home. These were fabricated approximately 10 years ago while in prison. Patient indicates that he hasn't worn the partial dentures "for a while". Patient denies having dental phobia.  PROBLEM LIST: Patient Active Problem List   Diagnosis Date Noted  . Cancer of hypopharynx ([place]) 03/30/2016    Priority: High  . Weakness 03/26/2016  . Gout 03/26/2016  . 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 HCAP (healthcare-associated pneumonia) 03/23/2016  . Hypokalemia 03/23/2016  . Hypomagnesemia 03/23/2016  . Protein-calorie malnutrition, severe ([place]) 03/23/2016  . Jaundice   . Reflux esophagitis   . Pancreatic lesion   . Abnormal transaminases   . Normochromic normocytic anemia   . Pyriform sinus mass   . Melena   . Ascites   . Cirrhosis of liver  with ascites ([place]) 03/14/2016  . Liver failure ([place]) 03/14/2016  . Anemia due to chronic blood loss 03/14/2016  . Respiratory failure with hypoxia ([place]) 03/14/2016  . Lesion of pancreas   . Generalized abdominal pain   . Hyponatremia   . Abscess of skin of abdomen 01/13/2016  . Leg cramping 01/13/2016  . Abnormal LFTs (liver function tests) 10/13/2015  . Alcohol use ([place]) 10/13/2015  . Left knee pain 02/04/2015  . Encounter for general adult medical examination with abnormal findings 12/25/2014  . Medicare annual wellness visit, subsequent 12/25/2014  . Knee pain 12/08/2014  . Chronic pancreatitis ([place]) 05/03/2011  . Nausea and vomiting 04/24/2011  . Rhinorrhea 04/24/2011  . Gastroenteritis 10/20/2010  . PREMATURE VENTRICULAR CONTRACTIONS 07/07/2010  . HYPERKALEMIA 06/29/2010  . ACUTE GOUTY ARTHROPATHY 06/28/2010  . HYPERTENSION, BENIGN ESSENTIAL 06/28/2010    PMH: Past Medical History:  Diagnosis Date  . Abnormal LFTs   . 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 Chronic back pain   . Chronic knee pain    left  . Fatty liver   . Gout   . Hypertension   . Pancreatic mass   . Pancreatitis   . Prostate cancer ([place])     PSH: Past Surgical History:  Procedure Laterality Date  . ESOPHAGOGASTRODUODENOSCOPY N/A 03/15/2016   Procedure: ESOPHAGOGASTRODUODENOSCOPY (EGD);  Surgeon: Doran Stabler, MD;  Location: [facility];  Service: Endoscopy;  Laterality: N/A;  . KNEE SURGERY     left  . PANENDOSCOPY N/A 03/18/2016   Procedure: DIRECT  LARYNGOSCOPY, WITH BRONCHOSCOPY, AND ESOPHAGOSCOPY, WITH BIOPSY;  Surgeon: [name], MD;  Location: [facility];  Service: ENT;  Laterality: N/A;  . PROSTATE BIOPSY      ALLERGIES: Allergies  Allergen Reactions  . Penicillins Hives and Rash    Has patient had a PCN reaction causing immediate rash, facial/tongue/throat swelling, SOB or lightheadedness with hypotension:Yes Has patient had a PCN reaction  causing severe rash involving mucus membranes or skin necrosis:unsure Has patient had a PCN reaction that required hospitalization:No Has patient had a PCN reaction occurring within the last 10 years:No  If all of the above answers are "NO", then may proceed with Cephalosporin use.     MEDICATIONS: Current Outpatient Prescriptions  Medication Sig Dispense Refill  . albuterol (PROVENTIL) (2.5 MG/3ML) 0.083% nebulizer solution Take 3 mLs (2.5 mg total) by nebulization every 6 (six) hours as needed for wheezing or shortness of breath. 75 mL 12  . allopurinol (ZYLOPRIM) 100 MG tablet Take 1 tablet (100 mg total) by mouth daily. 30 tablet 1  . aspirin 81 MG chewable tablet Chew 81 mg by mouth daily.    . colchicine 0.6 MG tablet Take 1 tablet (0.6 mg total) by mouth daily. 30 tablet 2  . cyclobenzaprine (FLEXERIL) 5 MG tablet Take 1 tablet (5 mg total) by mouth 3 (three) times daily as needed for muscle spasms. 60 tablet 1  . feeding supplement, ENSURE ENLIVE, (ENSURE ENLIVE) LIQD Take 237 mLs by mouth 3 (three) times daily between meals. 237 mL 12  . fluticasone (FLONASE) 50 MCG/ACT nasal spray Place 1 spray into the nose daily.    . folic acid (FOLVITE) 1 MG tablet Take 1 tablet (1 mg total) by mouth daily. 90 tablet 1  . furosemide (LASIX) 40 MG tablet Take 1 tablet (40 mg total) by mouth daily. 30 tablet 0  . guaiFENesin (MUCINEX) 600 MG 12 hr tablet Take 1 tablet (600 mg total) by mouth 2 (two) times daily. 30 tablet 0  . lactulose (CEPHULAC) 10 g packet Take 1 packet (10 g total) by mouth daily. 30 each 0  . Lifitegrast (XIIDRA) 5 % SOLN Apply 1 drop to eye 2 (two) times daily.    . mupirocin cream (BACTROBAN) 2 % Apply 1 application topically 2 (two) times daily. 15 g 0  . pantoprazole (PROTONIX) 40 MG tablet Take 1 tablet (40 mg total) by mouth 2 (two) times daily before a meal. 60 tablet 0  . propranolol (INDERAL) 10 MG tablet Take 1 tablet (10 mg total) by mouth 2 (two) times daily. 60  tablet 0  . RA VITAMIN B-1 100 MG TABS Take 1 tablet by mouth daily.  0  . sodium chloride (OCEAN) 0.65 % SOLN nasal spray Place 2 sprays into both nostrils 2 (two) times daily. 1 Bottle 0  . spironolactone (ALDACTONE) 25 MG tablet Take 1 tablet (25 mg total) by mouth daily. (Patient not taking: Reported on 03/30/2016) 30 tablet 0   No current facility-administered medications for this visit.     LABS: Lab Results  Component Value Date   WBC 9.5 03/19/2016   HGB 10.5 (L) 03/19/2016   HCT 32.5 (L) 03/19/2016   MCV 95.3 03/19/2016   PLT 127 (L) 03/19/2016      Component Value Date/Time   NA 125 (L) 03/19/2016 0411   NA 125 (A) 03/19/2016   K 3.9 03/19/2016 0411   CL 91 (L) 03/19/2016 0411   CO2 25 03/19/2016 0411   GLUCOSE 97 03/19/2016  0411   BUN 14 03/19/2016 0411   BUN 14 03/19/2016   CREATININE 0.69 03/19/2016 0411   CREATININE 0.91 05/02/2011 1150   CALCIUM 8.1 (L) 03/19/2016 0411   GFRNONAA >60 03/19/2016 0411   GFRAA >60 03/19/2016 0411   Lab Results  Component Value Date   INR 1.35 03/18/2016   INR 1.16 03/14/2016   INR 1.2 (A) 03/14/2016   No results found for: PTT  SOCIAL HISTORY: Social History   Social History  . Marital status: Single    Spouse name: N/A  . Number of children: 1  . Years of education: 26   Occupational History  . Disability     Knee / Back   Social History Main Topics  . Smoking status: Former Smoker    Packs/day: 0.30    Years: 30.00    Types: Cigarettes    Quit date: 03/19/2016  . Smokeless tobacco: Never Used  . Alcohol use 72.0 oz/week    84 Cans of beer, 36 Shots of liquor per week     Comment: The patient drank a 12 pack of beer a day and 1 pint of liquor every other day. He stopped on 03/14/16 when he was admitted to the hospital.  . Drug use: No     Comment: Hx of cocaine abuse over 30 years ago  . Sexual activity: Yes   Other Topics Concern  . Not on file   Social History Narrative   Born and raised in  [place], [place]. Fun: Stay home and watch TV.    Denies religious beliefs that would effect health care.     FAMILY HISTORY: Family History  Problem Relation Age of Onset  . Diabetes Mother   . Diabetes Maternal Grandmother   . Cancer Brother 34    throat    REVIEW OF SYSTEMS: Reviewed with patient as per history of present illness.  Psych: Patient denies dental phobia.  DENTAL HISTORY: CHIEF COMPLAINT: Patient referred by Dr. [name] for dental consultation.   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 Patient with anticipated radiation therapy with Dr. [name]. Patient is now seen as part of a medically necessary preradiation therapy dental protocol examination.  Patient currently denies acute toothaches, swellings, or abscesses. Patient does have a history of some tooth sensitivity involving an upper right premolar approximately one to 2 months ago.  He described a dull, achy pain that lasted "for minutes" only. Patient indicated that the pain reached an intensity of 7 out of 10 by patient report but is currently 0 out of 10 in intensity.  Patient indicates that he last saw his dentist, Dr. Ivin Poot, approximately 2 years ago for a cleaning. Patient indicates that he does have upper lower partial dentures but he left them at home. These were fabricated approximately 10 years ago while in prison. Patient indicates that he hasn't worn the partial dentures "for a while". Patient denies having dental phobia.   DENTAL EXAMINATION: GENERAL: Patient is a well-developed, slightly built male in no acute distress. HEAD AND NECK: There is no palpable neck lymphadenopathy. The patient denies acute TMJ symptoms. Maximum interincisal opening is measured at 35 mm. INTRAORAL EXAM: Patient has incipient xerostomia. There is no evidence of oral abscess formation. DENTITION: Patient is missing tooth #'s 1-3, 7,10,12,14-16,17-19,  and 29-32. PERIODONTAL: Patient has chronic periodontitis with plaque and calculus accumulations, gingival recession, and tooth mobility as charted. There is moderate bone loss noted.  DENTAL CARIES/SUBOPTIMAL RESTORATIONS: Patient has multiple suboptimal dental restorations. ENDODONTIC: Patient currently denies acute toothache symptoms but has had a history of tooth sensitivity involving upper right premolar #4. There are no obvious periapical radiolucencies. CROWN AND BRIDGE: There are no crown or bridge restorations. PROSTHODONTIC: Patient has upper and lower partial dentures that he did not bring with him today. Patient has not been wearing them recently. OCCLUSION: Patient has a class III malocclusion with crossbite.  RADIOGRAPHIC INTERPRETATION: An orthopantogram was taken and supplemented with 7 periapical radiographs. Additional periapical radiographs were unable to be obtained secondary to significant gag reflex. There are multiple missing teeth. There is moderate bone loss. There is supra-eruption and drifting of the unopposed teeth into the edentulous areas. Multiple diastemas are noted. No obvious periapical radiolucencies are noted. Radiographic calculus is noted.  ASSESSMENTS: 1. Cancer of the pyriform sinus/ hypopharynx. 2. Preradiation therapy dental protocol 3. Chronic periodontitis with bone loss 4. Gingival recession 5. Accretions 6. Tooth mobility 7. Multiple suboptimal dental restorations 8. Multiple missing teeth 9. History of partial dentures 10. Malocclusion-class III 11. Thrombocytopenia with the risk for bleeding with invasive dental procedures 12. Significant gag reflex  PLAN/RECOMMENDATIONS: 1. I discussed the risks, benefits, and complications of various treatment options with the patient and his fiance in relationship to his medical and dental conditions, anticipated radiation therapy, and radiation therapy side effects to include xerostomia, radiation caries,  trismus, mucositis, taste changes, gum and jawbone changes, and risk for infection, bleeding, and osteoradionecrosis. We discussed various treatment options to include no treatment, multiple extractions with alveoloplasty, pre-prosthetic surgery as indicated, periodontal therapy, dental restorations, root canal therapy, crown and bridge therapy, implant therapy, and replacement of missing teeth as indicated. The patient currently wishes to proceed with consideration for extraction of tooth #4 along with initial periodontal therapy if this is acceptable to Dr. [name]. If Dr. [name] needs to proceed with the radiation therapy as soon as possible, the dental treatment can be deferred once he is stable medically after the radiation therapy.  This treatment and additional dental treatment as indicated can be provided by his primary dentist, Dr. Ivin Poot, 2-3 months after the radiation therapy. Due to significant gagging, the patient wished to defer impressions for fabrication of fluoride trays and a prescription for PreviDent 5000 Plus will be provided in the future.   2. Discussion of findings with medical team and coordination of future medical and dental care as needed.  I spent in excess of  120 minutes during the conduct of this consultation and >50% of this time involved direct face-to-face encounter for counseling and/or coordination of the patient's care.    Lenn Cal, DDS

## 2016-04-06 ENCOUNTER — Telehealth: Payer: Self-pay | Admitting: *Deleted

## 2016-04-06 ENCOUNTER — Telehealth: Payer: Self-pay | Admitting: Family

## 2016-04-06 ENCOUNTER — Encounter: Payer: Medicare HMO | Admitting: Nutrition

## 2016-04-06 ENCOUNTER — Ambulatory Visit: Payer: Medicare HMO | Admitting: Nutrition

## 2016-04-06 NOTE — Telephone Encounter (Signed)
Adam Marshall is calling to get some clarifying information and get fax info to send orders to be signed. Provided fax info.

## 2016-04-06 NOTE — Telephone Encounter (Signed)
Pts wife called and LVM on my phone asking to also have orders sent for a bedside commode. The order has been placed as well and pt is aware.

## 2016-04-06 NOTE — Telephone Encounter (Signed)
"  Someone called today and I could not get to the phone."  No call documentation today.  Shared yesterday's call notes/appointment information per RT.

## 2016-04-06 NOTE — Progress Notes (Signed)
55 year old male diagnosed with left pyriform sinus/laryngeal malignancy and prostate malignancy.  Past medical history includes pancreatic mass, hypertension, gout.  Medications include Lasix, Protonix vitamin B1, and MiraLAX.  Labs include sodium of 125, July 15.  Height: 65 inches. Weight: 102.2 pounds August 2. Usual body weight: 135 pounds per patient. BMI: 17.01.  Patient was seen with his fiance. Patient reports his appetite comes and goes. He has nausea with some vomiting every morning. He has constipation but has been taking MiraLAX and Metamucil. Dietary recall reveals patient consuming approximately 1255 cal from food and 1400 cal from oral nutrition supplements to total greater than 2600 cal.  Patient reports he has been eating like this for greater than one week.  Patient meets criteria for severe malnutrition in the context of chronic illness secondary to severe muscle and fat loss, 24% weight loss from usual body weight and BMI of 17.01 indicating underweight.  Patient has cancer cachexia.  Estimated nutrition needs: 1850-2150 calories, 80-90 grams protein, greater than 2 L fluid.  Nutrition diagnosis:  Severe malnutrition related to cancer diagnosis as evidenced by severe wasting of both muscle and fat stores and 24% weight loss.  Intervention: Educated patient to continue strategies for eating 3 meals +3 snacks daily.  Encouraged patient to drink four oral nutrition supplements daily.   Provided one complementary case of Ensure Plus. Educated patient on ways to add additional calories. Educated patient on strategies for improving constipation. Questions were answered and teach back method was used.  Monitoring, evaluation, goals:  Patient will tolerate increased calories and protein to minimize weight loss.  Next visit: Tuesday, August 8, during head and neck clinic.  **Disclaimer: This note was dictated with voice recognition software. Similar sounding words  can inadvertently be transcribed and this note may contain transcription errors which may not have been corrected upon publication of note.**

## 2016-04-07 ENCOUNTER — Telehealth: Payer: Self-pay | Admitting: Emergency Medicine

## 2016-04-07 NOTE — Telephone Encounter (Signed)
Note and agree with follow up or seek emergency care for increased headaches or vomiting.

## 2016-04-07 NOTE — Telephone Encounter (Signed)
Adam Marshall called to let you know Pt had a fall yesterday evening. No bleeding as of now. Complain about headaches yesterday but not complaining today. Told pt to call and make an appointment if he started hurting after the fall. Just wanted you to be informed. Thanks.

## 2016-04-11 ENCOUNTER — Encounter (HOSPITAL_COMMUNITY)
Admission: RE | Admit: 2016-04-11 | Discharge: 2016-04-11 | Disposition: A | Discharge status: Home / Self Care | Payer: Medicare HMO | Source: Ambulatory Visit | Attending: Radiation Oncology | Admitting: Radiation Oncology

## 2016-04-11 ENCOUNTER — Telehealth: Payer: Self-pay | Admitting: *Deleted

## 2016-04-11 DIAGNOSIS — C12 Malignant neoplasm of pyriform sinus: Secondary | ICD-10-CM

## 2016-04-11 LAB — GLUCOSE, CAPILLARY: Glucose-Capillary: 116 mg/dL — ABNORMAL HIGH (ref 65–99)

## 2016-04-11 MED ORDER — FLUDEOXYGLUCOSE F - 18 (FDG) INJECTION
5.5200 | Freq: Once | INTRAVENOUS | Status: AC | PRN
  Administered 2016-04-11: 5.52 via INTRAVENOUS

## 2016-04-11 NOTE — Telephone Encounter (Signed)
  Oncology Nurse Navigator Documentation  Spoke with Mr. Wigfall's SO, confirmed his attendance at tomorrow morning's [facility] with 0800 arrival time, explained check-in procedure.  She voiced understanding.  Gayleen Orem, RN, BSN, [place] at [facility] [phone]   Navigator Location: [place] [id number]/07/17 1214) Navigator Encounter Type: Telephone (04/11/16 1214) Telephone: Jerilee Hoh Confirmation/Clarification (04/11/16 1214)           Treatment Phase: Pre-Tx/Tx Discussion (04/11/16 1214) Barriers/Navigation Needs: Coordination of Care (04/11/16 1214)                          Time Spent with Patient: 15 (04/11/16 1214)

## 2016-04-12 ENCOUNTER — Encounter: Payer: Self-pay | Admitting: Radiation Oncology

## 2016-04-12 ENCOUNTER — Ambulatory Visit: Payer: Medicare HMO | Admitting: Physical Therapy

## 2016-04-12 ENCOUNTER — Encounter: Payer: Self-pay | Admitting: *Deleted

## 2016-04-12 ENCOUNTER — Ambulatory Visit
Admission: RE | Admit: 2016-04-12 | Discharge: 2016-04-12 | Disposition: A | Discharge status: Home / Self Care | Payer: Medicare HMO | Source: Ambulatory Visit | Attending: Radiation Oncology | Admitting: Radiation Oncology

## 2016-04-12 ENCOUNTER — Ambulatory Visit: Payer: Medicare HMO | Attending: Radiation Oncology

## 2016-04-12 ENCOUNTER — Ambulatory Visit: Payer: Medicare HMO | Admitting: Nutrition

## 2016-04-12 ENCOUNTER — Other Ambulatory Visit: Payer: Self-pay | Admitting: Radiation Oncology

## 2016-04-12 ENCOUNTER — Ambulatory Visit (HOSPITAL_COMMUNITY): Payer: Self-pay | Admitting: Dentistry

## 2016-04-12 VITALS — BP 104/73 | HR 101 | Temp 98.3°F

## 2016-04-12 VITALS — BP 112/77 | HR 106 | Temp 98.3°F | Ht 65.0 in | Wt 102.3 lb

## 2016-04-12 DIAGNOSIS — Z01818 Encounter for other preprocedural examination: Secondary | ICD-10-CM

## 2016-04-12 DIAGNOSIS — C61 Malignant neoplasm of prostate: Secondary | ICD-10-CM

## 2016-04-12 DIAGNOSIS — K0530 Chronic periodontitis, unspecified: Secondary | ICD-10-CM

## 2016-04-12 DIAGNOSIS — K036 Deposits [accretions] on teeth: Secondary | ICD-10-CM

## 2016-04-12 DIAGNOSIS — K0889 Other specified disorders of teeth and supporting structures: Secondary | ICD-10-CM

## 2016-04-12 DIAGNOSIS — Z510 Encounter for antineoplastic radiation therapy: Secondary | ICD-10-CM | POA: Diagnosis not present

## 2016-04-12 DIAGNOSIS — C12 Malignant neoplasm of pyriform sinus: Secondary | ICD-10-CM

## 2016-04-12 DIAGNOSIS — K0401 Reversible pulpitis: Secondary | ICD-10-CM

## 2016-04-12 MED ORDER — HYDROCODONE-ACETAMINOPHEN 5-325 MG PO TABS: 1.0000 | ORAL_TABLET | Freq: Four times a day (QID) | ORAL | 0 refills | Status: DC | PRN

## 2016-04-12 NOTE — Progress Notes (Signed)
04/12/2016  Patient:            Aris Lot Date of Birth:  [birth year] MRN:                [id number]   BP 102/69 (BP Location: Left Arm, Cuff Size: Normal)   Pulse (!) 101   Temp 98.3 F (36.8 C) (Oral)   Aris Lot presents to dental medicine for dental treatment today.   Lab Results  Component Value Date   WBC 9.5 03/19/2016   HGB 10.5 (L) 03/19/2016   HCT 32.5 (L) 03/19/2016   MCV 95.3 03/19/2016   PLT 127 (L) 03/19/2016   BMET    Component Value Date/Time   NA 125 (L) 03/19/2016 0411   NA 125 (A) 03/19/2016   K 3.9 03/19/2016 0411   CL 91 (L) 03/19/2016 0411   CO2 25 03/19/2016 0411   GLUCOSE 97 03/19/2016 0411   BUN 14 03/19/2016 0411   BUN 14 03/19/2016   CREATININE 0.69 03/19/2016 0411   CREATININE 0.91 05/02/2011 1150   CALCIUM 8.1 (L) 03/19/2016 0411   GFRNONAA >60 03/19/2016 0411   GFRAA >60 03/19/2016 0411    Lab Results  Component Value Date   INR 1.35 03/18/2016   INR 1.16 03/14/2016   INR 1.2 (A) 03/14/2016   No results found for: PTT   Aris Lot presents for dental procedures in the Dental medicine clinic. Patient to have extraction of tooth #4 and gross debridement of remaining dentition.   SUBJECTIVE: The patient denies any acute medical or dental changes and agrees to proceed with treatment as planned.  EXAM: No sign of acute dental changes.  ASSESSMENT: Patient is affected by chronic periodontitis, accretions, and loose tooth #4.  PLAN: Patient agrees to proceed with treatment as planned in the dental medicine clinic as previously discussed and accepts the risks, benefits, and complications of the proposed treatment. Patient is aware of the risk for bleeding, bruising, swelling, infection, pain, nerve damage, soft tissue damage, damage to adjacent teeth, sinus involvement, root tip fracture, mandible fracture. Patient also is aware of the potential for other complications not mentioned above.   Lenn Cal, DDS

## 2016-04-12 NOTE — Progress Notes (Signed)
Nutrition follow up completed in Head and Neck Clinic. Met with patient and fiancee.  Patient reports appetite is about the same. He has been eating regular meals and drinking 4 Ensure Plus every day.  Weight improved at 102.3 pounds from 102.2 pounds on August 2.  Estimated Nutrition Needs: 1657 - 2150 kcal, 80 - 90 grams protein, greater than 2 L fluid.  Nutrition diagnosis: Severe Malnutrition continues.  Intervention: Patient should continue strategies for increased oral intake of meals, snacks, and supplements. Will provide 2nd case of Ensure Plus. Questions answered and teach back method used.  Monitoring, Evaluation, Goals:  Patient will tolerate increased calories and protein to promote weight gain.  Next Visit: To be scheduled.

## 2016-04-12 NOTE — Patient Instructions (Signed)

## 2016-04-12 NOTE — Telephone Encounter (Signed)
Fax signed and sent back

## 2016-04-12 NOTE — Progress Notes (Addendum)
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 He comes today for the multidisciplinary clinic with speech therapy, nutrition and social work, he did undergo PET scan yesterday which has not been reviewed by Dr. [name] but does show hypermetabolic changes within  The left puriform sinus without findings concerning for metastatic disease. Incidentally there were some small hypermetabolic mediastinal lymph nodes favored to be reactive, small nonspecific pulmonary nodules, and hypoattenuating lesions of the liver incompletely characterized but most probable consistent with his known history of cirrhosis. He does have a hypoattenuating pancreatic lesion most likely a pseudocyst but also would be best evaluated by MRI per the radiologist. His case is also presented at the headache neck multidisciplinary clinic and he was felt to most probably be a candidate for radiation therapy alone                           On review of systems, the patient states that he is doing okay. He has found meeting today with a multidisciplinary clinic to be quite helpful in terms of understanding his options for nutrition, and social needs. He states that he does notice some incomplete emptying and at times dribbling after completing his urinary stream. He denies any incontinence episodes of stool. He denies any nausea or abdominal pain at this time. No other complaints are verbalized.  ALLERGIES:  is allergic to penicillins.  Meds: Current Outpatient Prescriptions  Medication Sig Dispense Refill  . albuterol (PROVENTIL) (2.5 MG/3ML) 0.083% nebulizer solution Take 3 mLs (2.5 mg total)  by nebulization every 6 (six) hours as needed for wheezing or shortness of breath. 75 mL 12  . allopurinol (ZYLOPRIM) 100 MG tablet Take 1 tablet (100 mg total) by mouth daily. 30 tablet 1  . aspirin 81 MG chewable tablet Chew 81 mg by mouth daily.    . colchicine 0.6 MG tablet Take 1 tablet (0.6 mg total) by mouth daily. 30 tablet 2  . cyclobenzaprine (FLEXERIL) 5 MG tablet Take 1 tablet (5 mg total) by mouth 3 (three) times daily as needed for muscle spasms. 60 tablet 1  . feeding supplement, ENSURE ENLIVE, (ENSURE ENLIVE) LIQD Take 237 mLs by mouth 3 (three) times daily between meals. 237 mL 12  . fluticasone (FLONASE) 50 MCG/ACT nasal spray Place 1 spray into the nose daily.    . folic acid (FOLVITE) 1 MG tablet Take 1 tablet (1 mg total) by mouth daily. 90 tablet 1  . furosemide (LASIX) 40 MG tablet Take 1 tablet (40 mg total) by mouth daily. 30 tablet 0  . guaiFENesin (MUCINEX) 600 MG 12 hr tablet Take 1 tablet (600 mg total) by mouth 2 (two) times daily. 30 tablet 0  . lactulose (CEPHULAC) 10 g packet Take 1 packet (10 g total) by mouth daily. 30 each 0  . Lifitegrast (XIIDRA) 5 % SOLN Apply 1 drop to eye 2 (two) times daily.    . mupirocin cream (BACTROBAN) 2 % Apply 1 application topically 2 (two) times daily. 15 g 0  . pantoprazole (PROTONIX) 40 MG tablet Take 1 tablet (40 mg total) by mouth 2 (two) times daily  before a meal. 60 tablet 0  . propranolol (INDERAL) 10 MG tablet Take 1 tablet (10 mg total) by mouth 2 (two) times daily. 60 tablet 0  . RA VITAMIN B-1 100 MG TABS Take 1 tablet by mouth daily.  0  . sodium chloride (OCEAN) 0.65 % SOLN nasal spray Place 2 sprays into both nostrils 2 (two) times daily. 1 Bottle 0  . spironolactone (ALDACTONE) 25 MG tablet Take 1 tablet (25 mg total) by mouth daily. (Patient not taking: Reported on 03/30/2016) 30 tablet 0   No current facility-administered medications for this encounter.     Physical Findings:  height is 5\' 5"  (1.651 m) and  weight is 102 lb 4.8 oz (46.4 kg). His temperature is 98.3 F (36.8 C). His blood pressure is 112/77 and his pulse is 106 (abnormal). His oxygen saturation is 100%.  In general this is a chronically ill-appearing African-American male in no acute distress. He's alert and oriented x4 and appropriate throughout the examination. Cardiopulmonary assessment is negative for acute distress and he exhibits normal effort.    Lab Findings: Lab Results  Component Value Date   WBC 9.5 03/19/2016   HGB 10.5 (L) 03/19/2016   HCT 32.5 (L) 03/19/2016   MCV 95.3 03/19/2016   PLT 127 (L) 03/19/2016     Radiographic Findings: Dg Chest 2 View  Result Date: 03/14/2016 CLINICAL DATA:  55 year old male with abdominal pain EXAM: CHEST  2 VIEW COMPARISON:  Chest radiograph dated 08/04/2004 FINDINGS: There is a shallow inspiration. Focal area of opacity at the right lung base posteriorly may represent atelectasis or pneumonia. A small right pleural effusion may be present. There is no pneumothorax. The cardiac silhouette is within normal limits. No acute osseous pathology. IMPRESSION: Focal right lung base atelectasis versus infiltrate. Clinical correlation and follow-up recommended. Electronically Signed   By: Anner Crete M.D.   On: 03/14/2016 02:39   Ct Soft Tissue Neck W Contrast  Result Date: 03/16/2016 CLINICAL DATA:  Left piriform sinus mass lesion with airway impingement. EXAM: CT NECK WITH CONTRAST TECHNIQUE: Multidetector CT imaging of the neck was performed using the standard protocol following the bolus administration of intravenous contrast. CONTRAST:  33mL ISOVUE-300 IOPAMIDOL (ISOVUE-300) INJECTION 61% COMPARISON:  None. FINDINGS: Pharynx and larynx: No mucosal or submucosal mass is discernible by CT, with specific attention to the piriform sinuses. Endoscopy may be more sensitive to mucosal disease. Salivary glands: Submandibular and parotid glands are normal. Thyroid: Normal Lymph nodes: No  enlarged or low-density nodes on either side of the neck. Vascular: Extensive atherosclerotic disease at both carotid bifurcations. This study was not done in the form of a CT angiogram, but stenosis is estimated at 20% in the right ICA and 50% in the left ICA. Venous structures appear normal. Limited intracranial: Negative Visualized orbits: Normal Mastoids and visualized paranasal sinuses: Clear Skeleton: Ordinary cervical spondylosis Upper chest: Advanced emphysema. Areas of interstitial density in both upper lungs most likely representing scarring. Pneumonia not excluded. Small layering effusions bilaterally. IMPRESSION: No mucosal or submucosal disease is identified, with specific attention to the left piriform sinus. Endoscopy would be more sensitive for mucosal disease. No lymphadenopathy. Aortic atherosclerosis. Carotid artery atherosclerosis. 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 Emphysema. Pulmonary scarring and/or upper lobe infiltrates. Small pleural effusions. Electronically Signed   By: Nelson Chimes M.D.   On: 03/16/2016 17:51   Ct Abdomen Pelvis W Contrast  Result Date: 03/14/2016 CLINICAL DATA:  Left upper quadrant and periumbilical pain  for 2 days. Abdominal distention. Nausea and vomiting. Elevated white cell count. Recent diagnosis of prostate cancer. EXAM: CT ABDOMEN AND PELVIS WITH CONTRAST TECHNIQUE: Multidetector CT imaging of the abdomen and pelvis was performed using the standard protocol following bolus administration of intravenous contrast. CONTRAST:  53mL ISOVUE-300 IOPAMIDOL (ISOVUE-300) INJECTION 61% COMPARISON:  None. FINDINGS: Small bilateral pleural effusions with basilar atelectasis, greater on the left. Emphysematous changes and fibrosis in the lung bases. Coronary artery calcifications. Small esophageal hiatal hernia. Diffuse fatty infiltration of the liver. Sub cm low-attenuation lesions in the liver are too small to characterize but likely represent cysts.  Recanalization of the periumbilical veins. Calcifications in the left lobe of the liver, likely dystrophic. Probable ectatic cirrhosis with enlarged lateral segment left lobe and mild nodularity to the liver contour. Spleen size is normal. Moderate amount of free fluid throughout the abdomen and pelvis, likely representing ascites. Cystic lesion in the uncinate process of the pancreas measuring 1.5 cm diameter. Suggest follow-up in 1 year with MRI. Left adrenal gland nodule measuring 1 cm diameter. This is indeterminate but statistically likely represents an adenoma. Scattered lymph nodes in the retroperitoneum are not pathologically enlarged. Diffuse calcification of abdominal aorta without aneurysm. Inferior vena cava and kidneys are unremarkable. Stomach, small bowel, and colon are not abnormally distended. No free air in the abdomen. Pelvis: Diverticula in the sigmoid colon without evidence of diverticulitis. Prostate gland is enlarged at 4.1 cm diameter. Bladder is mostly decompressed. No pelvic mass or lymphadenopathy. Appendix is normal. Degenerative changes in the spine and hips. No destructive bone lesions. IMPRESSION: Hepatic cirrhosis and fatty infiltration with recanalization of the periumbilical veins. Moderate diffuse fluid throughout the abdomen and pelvis likely due to ascites. Bilateral pleural effusions with basilar atelectasis. 1.5 cm low-attenuation lesion in the uncinate process of the pancreas. Suggest follow-up MRI in 1 year. Electronically Signed   By: Lucienne Capers M.D.   On: 03/14/2016 05:23   Nm Pet Image Initial (pi) Skull Base To Thigh  Result Date: 04/11/2016 CLINICAL DATA:  Initial treatment strategy for staging of malignant neoplasm of piriform sinus. History of prostate cancer. EXAM: NUCLEAR MEDICINE PET SKULL BASE TO THIGH TECHNIQUE: 5.5 mCi F-18 FDG was injected intravenously. Full-ring PET imaging was performed from the skull base to thigh after the radiotracer. CT data was  obtained and used for attenuation correction and anatomic localization. FASTING BLOOD GLUCOSE:  Value: 116 mg/dl COMPARISON:  CT 03/16/2016 of the neck. Abdominopelvic CT of 03/14/2016. FINDINGS: NECK Left piriform sinus lesion is CT occult and measures a S.U.V. max of 5.4, including on image 38/series 4. No cervical nodal hypermetabolism. CHEST Small mildly hypermetabolic thoracic nodes. Example subcarinal node which measures 8 mm and a S.U.V. max of 3.9 on image 68/series 4. No hypermetabolic pulmonary nodules. ABDOMEN/PELVIS No abdominal pelvic nodal hypermetabolism. Hypermetabolism about the penile base is likely within the penile urethra or could represent urinary contamination. No well-defined soft tissue mass in this area. SKELETON No abnormal marrow activity. CT IMAGES PERFORMED FOR ATTENUATION CORRECTION Neck findings deferred to recent diagnostic CT. Cerebral atrophy which is age advanced. Dense carotid atherosclerosis. Advanced coronary artery atherosclerosis, including throughout the LAD. Centrilobular and paraseptal emphysema. 3 mm right upper lobe pulmonary nodule on image 17/series 8. A right upper lobe vague 5 mm pulmonary nodule on image 35/series 8. Patchy left lower lobe opacity on image 43/ series 8 is likely residual atelectasis, improved since the prior of 03/14/2016. Left adrenal thickening. Mild cirrhosis. Left hepatic lobe clustered  calcifications on image 109/series 4 are similar and could represent chronic thrombus of the the lateral segmental left portal vein. Multiple hypo attenuating small liver lesions are indeterminate. on the order of 4 mm. Advanced abdominal aortic atherosclerosis. Hypo attenuating pancreatic uncinate process lesion of 1.7 cm on image 116/series 4. No acute pancreatitis. Cholelithiasis. Resolved abdominal ascites and bilateral pleural effusions. Trace residual cul-de-sac fluid identified. IMPRESSION: 1. Left piriform sinus primary, without typical findings of  metastatic disease. 2. Hypermetabolic small mediastinal nodes are favored to be reactive and not in a typical primary distribution pattern for head neck primary malignancy. 3.  Coronary artery atherosclerosis. Aortic atherosclerosis. 4. Centrilobular emphysema with small nonspecific pulmonary nodules. Recommend attention on follow-up. 5. Cirrhosis with small hypo attenuating liver lesions which are incompletely characterized. Left hepatic lobe calcifications which are chronic and could represent chronic thrombus within the lateral segmental portal venous branch. Consider dedicated outpatient pre and post contrast abdominal MRI for further evaluation. 6. Resolution of bilateral pleural effusions and near complete resolution of intraperitoneal fluid. 7. Cholelithiasis. 8. Hypoattenuating pancreatic lesion is most likely a pseudocyst but would also be better evaluated on dedicated MRI. Electronically Signed   By: Abigail Miyamoto M.D.   On: 04/11/2016 16:54   US Paracentesis  Result Date: 03/16/2016 INDICATION: Cirrhosis, recurrent ascites. Request made for therapeutic paracentesis. EXAM: ULTRASOUND GUIDED THERAPEUTIC PARACENTESIS MEDICATIONS: None. COMPLICATIONS: None immediate. PROCEDURE: Informed written consent was obtained from the patient after a discussion of the risks, benefits and alternatives to treatment. A timeout was performed prior to the initiation of the procedure. Initial ultrasound scanning demonstrates a moderate amount of ascites within the left lower abdominal quadrant. The left lower abdomen was prepped and draped in the usual sterile fashion. 1% lidocaine was used for local anesthesia. Following this, a Yueh catheter was introduced. An ultrasound image was saved for documentation purposes. The paracentesis was performed. The catheter was removed and a dressing was applied. The patient tolerated the procedure well without immediate post procedural complication. FINDINGS: A total of approximately  2.6 liters of yellow fluid was removed. IMPRESSION: Successful ultrasound-guided therapeutic paracentesis yielding 2.6 liters of peritoneal fluid. Read by: Rowe Robert, PA-C Electronically Signed   By: Jerilynn Mages.  Shick M.D.   On: 03/16/2016 14:58   US Paracentesis  Result Date: 03/14/2016 INDICATION: Cirrhosis, ascites. Request made for diagnostic and therapeutic paracentesis. EXAM: ULTRASOUND GUIDED DIAGNOSTIC AND THERAPEUTIC PARACENTESIS MEDICATIONS: None. COMPLICATIONS: None immediate. PROCEDURE: Informed written consent was obtained from the patient after a discussion of the risks, benefits and alternatives to treatment. A timeout was performed prior to the initiation of the procedure. Initial ultrasound scanning demonstrates a moderate amount of ascites within the left lower abdominal quadrant. The left lower abdomen was prepped and draped in the usual sterile fashion. 1% lidocaine was used for local anesthesia. Following this, a Yueh catheter was introduced. An ultrasound image was saved for documentation purposes. The paracentesis was performed. The catheter was removed and a dressing was applied. The patient tolerated the procedure well without immediate post procedural complication. FINDINGS: A total of approximately 3 liters of clear, yellow fluid was removed. Samples were sent to the laboratory as requested by the clinical team. IMPRESSION: Successful ultrasound-guided diagnostic and therapeutic paracentesis yielding 3 liters of peritoneal fluid. Read by: Rowe Robert, PA-C Electronically Signed   By: Lucrezia Europe M.D.   On: 03/14/2016 16:58   US Abdomen Limited Ruq  Result Date: 03/14/2016 CLINICAL DATA:  History of cirrhosis.  Abnormal transaminases. EXAM: US  ABDOMEN LIMITED - RIGHT UPPER QUADRANT COMPARISON:  Abdominal ultrasound 10/20/2015 FINDINGS: Gallbladder: The gallbladder is normally distended. No gallbladder stones or sludge are seen. There is thickening and edema of the gallbladder wall  measuring up to 4.6 mm in cross-section. There is a tiny amount of pericholecystic fluid, however small amount of ascites is noted in perihepatic location as well. The sonographic Murphy's sign was reported as negative. Common bile duct: Diameter: Normal in diameter measuring 5 mm, where visualized. Liver: No focal lesion identified. The liver demonstrates nodular contour and diffusely increased echogenicity. There is a small amount of perihepatic ascites. IMPRESSION: Normally distended gallbladder with edematous wall measuring up to 4.6 mm. In the settings of liver cirrhosis this may represent reactive inflammatory changes, however acalculous cholecystitis cannot be excluded. Notably, the sonographic Murphy's sign was negative. Cirrhotic appearance of the liver was small volume of abdominal ascites. Electronically Signed   By: Fidela Salisbury M.D.   On: 03/14/2016 17:44    Impression/Plan: 1. Probable early stage hypopharyngeal cancer. I will review the patient's PET scan results with Dr. [name] upon his return. The patient will continue his plan for meeting with Dr. Enrique Sack today. I anticipate that we would proceed with an MRI of the abdomen to evaluate the pancreatic and liver findings, and follow his pulmonary nodules with serial scans expectantly as well as move forward with radiation planning and inform the patient on a near theonce direction from Dr. [name] has been received. 2. Intermediate risk prostate cancer. Dr. [name] will outline recommendations for treatment of this upon his return as well.     Carola Rhine, PAC

## 2016-04-12 NOTE — Progress Notes (Signed)
OPERATIVE REPORT  Patient:            Adam Marshall Date of Birth:  1961-07-22 MRN:                [id number]   DATE OF PROCEDURE:  04/12/2016  BP 102/69 (BP Location: Left Arm, Cuff Size: Normal)   Pulse (!) 101   Temp 98.3 F (36.8 C) (Oral)   PREOPERATIVE DIAGNOSES: 1. 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 2. Preradiation therapy dental protocol 3. Chronic periodontitis 4. Accretions 5. Loose tooth #4  6. History of acute pulpitis #4.   POSTOPERATIVE DIAGNOSES: 1. 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 2. Preradiation therapy dental protocol 3. Chronic periodontitis 4. Accretions 5. Loose tooth #4  6. History of acute pulpitis #4.   OPERATIONS: 1. Extraction of tooth #4  2. Gross debridement of remaining dentition   SURGEON: Lenn Cal, DDS  ASSISTANT: Camie Patience, (dental assistant)  ANESTHESIA: Local anesthesia only.  MEDICATIONS: 1. Local anesthesia with a total utilization of one carpule containing 34 mg of lidocaine with 0.017 mg of epinephrine.  SPECIMENS: There was one tooth discarded.  DRAINS: None  CULTURES: None  COMPLICATIONS: None   ESTIMATED BLOOD LOSS: Less than 5 mls.  INTRAVENOUS FLUIDS: none  INDICATIONS: The patient was recently diagnosed with squamous cell carcinoma of the piriform sinus. A dental consultation was then requested to evaluate poor dentition.  The patient was examined and treatment planned for extraction of tooth #4 with gross debridement of remaining dentition.  This treatment plan was formulated to decrease the risks and complications associated with dental infection from affecting the patient's systemic health during radiation therapy.  OPERATIVE FINDINGS: Patient was examined in operatory #1.  Tooth #4 was identified for extraction. The patient was noted be affected by chronic periodontitis, accretions, loose tooth #4, and a history of acute pulpitis #4.   DESCRIPTION OF  PROCEDURE: Patient was brought to the dental medicine operatory #1. Patient was then placed in a sitting position. Vital signs were obtained. Informed consent was obtained and witnessed. The patient was then prepped and draped in the usual manner for dental medicine procedure. A timeout was performed. The patient was identified and procedures were verified. The oral cavity was then thoroughly examined with the findings noted above. The patient was then ready for dental medicine procedure as follows:  Local anesthesia was then administered sequentially with a total utilization of one carpule containing 34 mg of lidocaine with 0.017 mg of epinephrine via infilatation on the facial and palatal aspects #4.  The remaining dentition was then approached while the anesthesia took effect. A gross debridement procedure was performed utilizing a sonic scaler. Further accretions were then removed with a series of hand curettes. The sonic scaler was then again used to further remove accretions.   The maxillary right quadrant was then approached. Tooth #4 was then subluxated and removed with a 150 forceps without complications. The socket was then curetted and compressed appropriately. The surgical site was irrigated with copious amounts of sterile saline. A piece of Surgifoam was then placed in the extraction socket appropriately. The surgical site was then closed with a figure-of-eight suture technique and 3-0 chromic gut material.  At this point time, the entire mouth was irrigated with copious amounts of sterile saline. The patient was examined for complications, seeing none, the dental medicine procedure was deemed to be complete. The patient tolerated the procedure well. All counts were correct for  the dental medicine procedure.  A postoperative blood pressure and pulse was obtained. Blood pressure was 104/73: Pulse was 101.  After an appropriate amount of time, the patient was dismissed to the care of his  significant other in stable condition.  The patient was given a prescription for Norco 5/325  #15. Patient is to take one tablet every 6 hours as needed for moderate to severe pain. No refills.   Lenn Cal, DDS.

## 2016-04-12 NOTE — Progress Notes (Signed)
Head & Neck Multidisciplinary Clinic Clinical Social Work  Clinical Social Work met with patient/family at head & neck multidisciplinary clinic to offer support and assess for psychosocial needs.  Adam Marshall was accompanied by his fiance, Juliann Pulse. Patient shared minimal input, looked to his spouse to answer questions.  Mrs. Gilpatrick shared concern regarding obtaining durable medical equipment- stated they were ordered when he was in hospital and still have not been received (wheelchair and bedside commode).  The patient needs a walker, but she was informed this would not be covered by insurance.  CSW provided information on [employer] spouse agreed to call them to determine if patient would qualify for free walker.  Patient's spouse also indicated need for advance directives completion.  They plan to contact CSW by phone to schedule upcoming appointment.  Mr. [name] shared his main concern was to "get stronger".  He indicated he is unable to "get around".  His main goal is to "get my [place] up and running again".  The patient shared his car is his most prized possession.  Clinical Social Work briefly discussed Clinical Social Work role and [employer] support programs/services.  Clinical Social Work encouraged patient to call with any additional questions or concerns.   Polo Riley, MSW, LCSW, OSW-C Clinical Social Worker [facility] [phone]

## 2016-04-15 NOTE — Progress Notes (Signed)
  Oncology Nurse Navigator Documentation  Met with Adam Marshall during H&N [place].  He was accompanied by his SO, [name].  Arrived him to Nursing, provided verbal and written overview of [facility], the clinicians who will be seeing him, encouraged him to ask questions during his time with them.  He was seen by Nutrition and SW.  He will be scheduled to see SLP and PT when [facility] PT visits are completed, Tye [place] indicated she will contact me when that occurs.  Spoke with him at end of [facility], addressed questions. He understands I can be contacted with needs/concerns.  Gayleen Orem, RN, BSN, [place] [facility] [place] [phone]

## 2016-04-15 NOTE — Progress Notes (Signed)
  Oncology Nurse Navigator Documentation  Met with Mr. Lockamy during H&N [place].  He was accompanied by  Arrived him to Nursing, provided verbal and written overview of [facility], the clinicians who will be seeing him, encouraged him to ask questions during his time with them.  He was seen by   Spoke with him at end of [facility], addressed questions. He understands I can be contacted with needs/concerns.  Gayleen Orem, RN, BSN, [place] at [facility] [phone]   Navigator Location: [facility] (04/12/16 0810) Navigator Encounter Type: Clinic/MDC (04/12/16 0810)               Barriers/Navigation Needs: Coordination of Care (04/12/16 0810)                          Time Spent with Patient: 75 (04/12/16 0810)

## 2016-04-19 ENCOUNTER — Other Ambulatory Visit: Payer: Self-pay | Admitting: Internal Medicine

## 2016-04-20 ENCOUNTER — Other Ambulatory Visit: Payer: Self-pay | Admitting: Radiation Oncology

## 2016-04-20 DIAGNOSIS — K869 Disease of pancreas, unspecified: Secondary | ICD-10-CM

## 2016-04-20 NOTE — Progress Notes (Signed)
I spoke with the patient and his fianc by phone today to discuss the plan for him to undergo simulation on Friday at 9:00. I also explained that I had spoken with his GI doctor and recommendations were made for an MRI of his abdomen to evaluate his liver and pancreatic lesions. Regarding his prostate cancer, he is not a surgical candidate given his comorbidities, and not a candidate for radioactive seed implant due to the size of this, we have touched base with [facility] and have asked that he undergo fiducial marker placement in the next week or so the anticipation of proceeding with external radiation to the prostate. The patient and his fianc state agreement and understanding of these plans. Risks, benefits, short and long-term effects were detailed with him.

## 2016-04-21 ENCOUNTER — Telehealth: Payer: Self-pay | Admitting: *Deleted

## 2016-04-21 NOTE — Telephone Encounter (Signed)
  Oncology Nurse Navigator Documentation  Spoke with Mr. Kettering's SO, [name], confirmed her understanding of tomorrow's 0900 CT SIM appt, encouraged an 0845 arrival.  She voiced understanding.  Gayleen Orem, RN, BSN, [place] at [facility] [phone]   Navigator Location: [place] [phone]/17/17 1355) Navigator Encounter Type: Telephone (04/21/16 1355) Telephone: [facility] Crocker Call;Appt Confirmation/Clarification (04/21/16 1355)             Barriers/Navigation Needs: Coordination of Care (04/21/16 1355)                          Time Spent with Patient: 15 (04/21/16 1355)

## 2016-04-22 ENCOUNTER — Other Ambulatory Visit: Payer: Self-pay | Admitting: Gastroenterology

## 2016-04-22 ENCOUNTER — Ambulatory Visit
Admission: RE | Admit: 2016-04-22 | Discharge: 2016-04-22 | Disposition: A | Discharge status: Home / Self Care | Payer: Medicare HMO | Source: Ambulatory Visit | Attending: Radiation Oncology | Admitting: Radiation Oncology

## 2016-04-22 ENCOUNTER — Other Ambulatory Visit: Payer: Self-pay | Admitting: Internal Medicine

## 2016-04-22 ENCOUNTER — Encounter: Payer: Self-pay | Admitting: *Deleted

## 2016-04-22 DIAGNOSIS — Z510 Encounter for antineoplastic radiation therapy: Secondary | ICD-10-CM | POA: Diagnosis not present

## 2016-04-22 DIAGNOSIS — C139 Malignant neoplasm of hypopharynx, unspecified: Secondary | ICD-10-CM

## 2016-04-22 MED ORDER — PANTOPRAZOLE SODIUM 40 MG PO TBEC: 40.0000 mg | DELAYED_RELEASE_TABLET | Freq: Two times a day (BID) | ORAL | 0 refills | Status: DC

## 2016-04-22 MED ORDER — LACTULOSE 10 G PO PACK: 10.0000 g | PACK | Freq: Every day | ORAL | 0 refills | Status: DC

## 2016-04-22 MED ORDER — PROPRANOLOL HCL 10 MG PO TABS: 10.0000 mg | ORAL_TABLET | Freq: Two times a day (BID) | ORAL | 0 refills | Status: DC

## 2016-04-22 MED ORDER — FUROSEMIDE 40 MG PO TABS: 40.0000 mg | ORAL_TABLET | Freq: Every day | ORAL | 0 refills | Status: DC

## 2016-04-22 MED ORDER — SPIRONOLACTONE 25 MG PO TABS: 25.0000 mg | ORAL_TABLET | Freq: Every day | ORAL | 0 refills | Status: DC

## 2016-04-22 NOTE — Addendum Note (Signed)
Encounter addended by: Benn Moulder, RN on: 04/22/2016  7:54 PM<BR>    Actions taken: Charge Capture section accepted

## 2016-04-22 NOTE — Progress Notes (Signed)
  Radiation Oncology         [phone] ________________________________  Name: Adam Marshall MRN: [id number]  Date: 04/22/2016  DOB: Feb 09, 1961  SIMULATION AND TREATMENT PLANNING NOTE    ICD-9-CM ICD-10-CM   1. 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  Identity was confirmed.  All relevant records and images related to the planned course of therapy were reviewed.  The patient freely provided informed written consent to proceed with treatment after reviewing the details related to the planned course of therapy. The consent form was witnessed and verified by the simulation staff.  Then, the patient was set-up in a stable reproducible  supine position for radiation therapy.  CT images were obtained.  Surface markings were placed.  The CT images were loaded into the planning software.  Then the target and avoidance structures were contoured.  Treatment planning then occurred.  The radiation prescription was entered and confirmed.  Then, I designed and supervised the construction of a total of 2 medically necessary complex treatment devices.  I have requested : Intensity Modulated Radiotherapy (IMRT) is medically necessary for this case for the following reason:  Parotid sparing..  I have ordered:Nutrition Consult  PLAN:  The patient will receive 66 Gy in 33 fractions with field reduction after 44 Gy.  ________________________________  Sheral Apley. [name], M.D.

## 2016-04-22 NOTE — Telephone Encounter (Signed)
1 month rx sent for lactulose, propanolol, pantoprazole, furosemide, and spironolactone until appt on 04/26/16 with Dr [name].

## 2016-04-22 NOTE — Progress Notes (Signed)
  Oncology Nurse Navigator Documentation  To provide support, encouragement and care continuity, met with Adam Marshall and his SO during CT SIM.  He tolerated procedure, commented he had some difficulty breathing initially.  Therapist and I encouraged him to notify with hand signal if this happens again during treatment session, I indicated O2 by nasal canula can be available during RT tmts.  He voiced understanding.  SO [name] indicated [facility] ends next week.  I noted I will arrange for PT and SLP as part of his care during [facility] tmt.  They voiced understanding.  Following CT SIM, I toured them to [facility], explained registration and arrival procedure.  They voiced understanding. They know to call me with questions/concerns.  Gayleen Orem, RN, BSN, [place] at [facility] [phone]  Navigator Location: [facility] (04/22/16 0910) Navigator Encounter Type: Clinic/MDC (04/22/16 0910)             Treatment Phase: CT SIM (04/22/16 0910) Barriers/Navigation Needs: Education (04/22/16 0910) Education: Preparing for Upcoming Surgery/ Treatment (04/22/16 0910)                        Time Spent with Patient: 45 (04/22/16 0910)

## 2016-04-26 ENCOUNTER — Encounter: Payer: Self-pay | Admitting: Gastroenterology

## 2016-04-26 ENCOUNTER — Ambulatory Visit (INDEPENDENT_AMBULATORY_CARE_PROVIDER_SITE_OTHER): Payer: Medicare HMO | Admitting: Gastroenterology

## 2016-04-26 ENCOUNTER — Ambulatory Visit (HOSPITAL_COMMUNITY)
Admission: RE | Admit: 2016-04-26 | Discharge: 2016-04-26 | Disposition: A | Discharge status: Home / Self Care | Payer: Medicare HMO | Source: Ambulatory Visit | Attending: Radiation Oncology | Admitting: Radiation Oncology

## 2016-04-26 VITALS — BP 90/64 | HR 72 | Ht 64.0 in | Wt 103.5 lb

## 2016-04-26 DIAGNOSIS — K860 Alcohol-induced chronic pancreatitis: Secondary | ICD-10-CM | POA: Diagnosis not present

## 2016-04-26 DIAGNOSIS — K7689 Other specified diseases of liver: Secondary | ICD-10-CM | POA: Insufficient documentation

## 2016-04-26 DIAGNOSIS — K869 Disease of pancreas, unspecified: Secondary | ICD-10-CM

## 2016-04-26 DIAGNOSIS — D7389 Other diseases of spleen: Secondary | ICD-10-CM | POA: Insufficient documentation

## 2016-04-26 DIAGNOSIS — R188 Other ascites: Secondary | ICD-10-CM

## 2016-04-26 DIAGNOSIS — R945 Abnormal results of liver function studies: Secondary | ICD-10-CM

## 2016-04-26 DIAGNOSIS — R7989 Other specified abnormal findings of blood chemistry: Secondary | ICD-10-CM

## 2016-04-26 DIAGNOSIS — K766 Portal hypertension: Secondary | ICD-10-CM | POA: Diagnosis not present

## 2016-04-26 DIAGNOSIS — K7031 Alcoholic cirrhosis of liver with ascites: Secondary | ICD-10-CM | POA: Diagnosis not present

## 2016-04-26 DIAGNOSIS — R932 Abnormal findings on diagnostic imaging of liver and biliary tract: Secondary | ICD-10-CM | POA: Diagnosis not present

## 2016-04-26 DIAGNOSIS — E43 Unspecified severe protein-calorie malnutrition: Secondary | ICD-10-CM

## 2016-04-26 DIAGNOSIS — C139 Malignant neoplasm of hypopharynx, unspecified: Secondary | ICD-10-CM

## 2016-04-26 MED ORDER — GADOBENATE DIMEGLUMINE 529 MG/ML IV SOLN
10.0000 mL | Freq: Once | INTRAVENOUS | Status: AC | PRN
  Administered 2016-04-26: 9 mL via INTRAVENOUS

## 2016-04-26 NOTE — Patient Instructions (Addendum)
Stop taking Spironolactone and Furosemide.  Call Dr. [name] if there is over a 5 pound weight gain in a week, as the water pills might need to be restarted.  If you are age 56 or older, your body mass index should be between 23-30. Your Body mass index is 17.22 kg/m. If this is out of the aforementioned range listed, please consider follow up with your Primary Care Provider.  If you are age 34 or younger, your body mass index should be between 19-25. Your Body mass index is 17.22 kg/m. If this is out of the aformentioned range listed, please consider follow up with your Primary Care Provider.   Thank you for choosing [facility]  Dr [name] III

## 2016-04-26 NOTE — Progress Notes (Signed)
[place] [facility] Progress Note  Chief Complaint: Cirrhosis  Subjective  History:  Adam Marshall follows up with me for the first time since I saw him in hospital consult about 6 weeks ago. 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 He had low albumin and low sodium. An upper endoscopy to screen for esophageal varices incidentally found a left piriform sinus lesion. ENT did an intraoperative biopsy in discovered squamous cell carcinoma. He'll be starting XRT week. No varices were found on upper endoscopy. It sounds like he has done well since then with continued alcohol abstinence, and he has had complete resolution of his ascites on low-dose diuretics. His partner has been very attentive to his care, he has been eating healthier, on low sodium diet, and walking daily.  ROS: Cardiovascular:  no chest pain Respiratory: no dyspnea  The patient's Past Medical, Family and Social History were reviewed and are on file in the EMR.  Objective:  Med list reviewed  Vital signs in last 24 hrs: Vitals:   04/26/16 1045  BP: 90/64  Pulse: 72    Physical Exam Cachectic  HEENT: sclera anicteric, oral mucosa moist without lesions  Neck: supple, no thyromegaly, JVD or lymphadenopathy  Cardiac: RRR without murmurs, S1S2 heard, no peripheral edema  Pulm: clear to auscultation bilaterally, normal RR and effort noted  Abdomen: soft, no tenderness, with active bowel sounds. Left lobe of the liver enlarged as before. He previously had large-volume tense ascites, now there is none.  Skin; warm and dry, no jaundice or rash  Neuro antalgic gait, uses a cane. Normal gross motor function and no asterixis. Speech fluent, affect still somewhat restricted but brighter than the last time I saw him.  Recent Labs:  CMP Latest Ref Rng & Units 03/19/2016 03/19/2016 03/18/2016  Glucose 65 - 99 mg/dL 97 - 94  BUN 6 - 20 mg/dL 14 14 14    Creatinine 0.61 - 1.24 mg/dL 0.69 0.7 0.73  Sodium 135 - 145 mmol/L 125(L) 125(A) 127(L)  Potassium 3.5 - 5.1 mmol/L 3.9 - 3.8  Chloride 101 - 111 mmol/L 91(L) - 92(L)  CO2 22 - 32 mmol/L 25 - 26  Calcium 8.9 - 10.3 mg/dL 8.1(L) - 8.1(L)  Total Protein 6.5 - 8.1 g/dL - - 5.5(L)  Total Bilirubin 0.3 - 1.2 mg/dL - - 6.3(H)  Alkaline Phos 38 - 126 U/L - - 492(H)  AST 15 - 41 U/L - - 136(H)  ALT 17 - 63 U/L - - 38     Radiologic studies:  Cystic panc lesion on CTAP  See MRI abdomen from today.  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  . Alcoholic cirrhosis of liver with ascites ([place]) Yes  . Alcohol-induced chronic pancreatitis ([place])   . Cancer of hypopharynx ([place])   . Pancreatic lesion   . Abnormal LFTs (liver function tests)   . Protein-calorie malnutrition, severe ([place])   . Ascites    His volume overload is now resolved. I'm concerned that he will develop dysphagia as he goes through radiation treatment for his squamous cell cancer. If so, he will be at risk for volume depletion if he has decreased oral intake while on diuretics. His abnormal LFTs appear due to cirrhosis, there is no reported biliary ductal dilation on imaging. He almost certainly has chronic pancreatitis, but it does not seem to be causing biliary stricture.   Plan:  Discontinue furosemide and spironolactone He is instructed to call me if he gains at least 5 pounds with a course of 7-10 days. I've confirm that he does have a scale at home. He will remain on a low 2 g sodium restricted diet I will see him in 3 months. In 6 months we will repeat imaging to his assess the pancreatic lesion. I hope he will not develop significant dysphagia during the radiation therapy, as his cirrhosis with portal hypertension/collateral vasculature and previous ascites preclude PEG placement. He would need a surgical jejunal tube.  Total time 30 minutes,  over half spent in counseling and coordination of care.   Adam Marshall

## 2016-05-02 ENCOUNTER — Encounter: Payer: Self-pay | Admitting: Nutrition

## 2016-05-02 DIAGNOSIS — Z510 Encounter for antineoplastic radiation therapy: Secondary | ICD-10-CM | POA: Diagnosis not present

## 2016-05-02 NOTE — Progress Notes (Signed)
Will provide third and final case of ensure plus for patient on August 29.

## 2016-05-03 ENCOUNTER — Encounter: Payer: Self-pay | Admitting: Radiation Oncology

## 2016-05-03 ENCOUNTER — Ambulatory Visit
Admission: RE | Admit: 2016-05-03 | Discharge: 2016-05-03 | Disposition: A | Discharge status: Home / Self Care | Payer: Medicare HMO | Source: Ambulatory Visit | Attending: Radiation Oncology | Admitting: Radiation Oncology

## 2016-05-03 ENCOUNTER — Encounter: Payer: Self-pay | Admitting: *Deleted

## 2016-05-03 ENCOUNTER — Encounter: Payer: Self-pay | Admitting: Nutrition

## 2016-05-03 ENCOUNTER — Telehealth: Payer: Self-pay

## 2016-05-03 DIAGNOSIS — Z510 Encounter for antineoplastic radiation therapy: Secondary | ICD-10-CM | POA: Diagnosis not present

## 2016-05-03 NOTE — Progress Notes (Signed)
Patient's s/o cancelled patient nutrition follow up tomorrow.

## 2016-05-03 NOTE — Progress Notes (Signed)
Spoke with patient and wife today--they expressed concerns about their bills--gave them a phone number to the billing office--they are going  to call and inquire about payment arrangments

## 2016-05-03 NOTE — Telephone Encounter (Signed)
Home health called and said they faxed paperwork back in July. They have yet to get them back signed. They are orders. She is going to fax it again today. And asked if we could get them back asap, They need to close his chart.  [phone] [id number]) Mariann Laster

## 2016-05-04 ENCOUNTER — Ambulatory Visit
Admission: RE | Admit: 2016-05-04 | Discharge: 2016-05-04 | Disposition: A | Discharge status: Home / Self Care | Payer: Medicare HMO | Source: Ambulatory Visit | Attending: Radiation Oncology | Admitting: Radiation Oncology

## 2016-05-04 ENCOUNTER — Encounter: Payer: Self-pay | Admitting: Nutrition

## 2016-05-04 DIAGNOSIS — Z510 Encounter for antineoplastic radiation therapy: Secondary | ICD-10-CM | POA: Diagnosis not present

## 2016-05-05 ENCOUNTER — Telehealth: Payer: Self-pay | Admitting: Family

## 2016-05-05 ENCOUNTER — Ambulatory Visit
Admission: RE | Admit: 2016-05-05 | Discharge: 2016-05-05 | Disposition: A | Discharge status: Home / Self Care | Payer: Medicare HMO | Source: Ambulatory Visit | Attending: Radiation Oncology | Admitting: Radiation Oncology

## 2016-05-05 ENCOUNTER — Encounter: Payer: Self-pay | Admitting: Radiation Oncology

## 2016-05-05 ENCOUNTER — Other Ambulatory Visit: Payer: Self-pay | Admitting: Internal Medicine

## 2016-05-05 VITALS — BP 144/96 | HR 90 | Wt 108.3 lb

## 2016-05-05 DIAGNOSIS — C139 Malignant neoplasm of hypopharynx, unspecified: Secondary | ICD-10-CM | POA: Insufficient documentation

## 2016-05-05 DIAGNOSIS — Z510 Encounter for antineoplastic radiation therapy: Secondary | ICD-10-CM | POA: Diagnosis not present

## 2016-05-05 MED ORDER — SONAFINE EX EMUL
1.0000 "application " | Freq: Two times a day (BID) | CUTANEOUS | Status: DC
  Administered 2016-05-05: 1 via TOPICAL

## 2016-05-05 NOTE — Progress Notes (Signed)
  Oncology Nurse Navigator Documentation  To provide support, encouragement and care continuity, met with Mr. Nack for his initial [facility] RT.  He was accompanied by his wife.  I reviewed the 2-step treatment process, reminded him of the registration/arrival procedure for subsequent treatments.  His wife observed the treatment, RTT Melanie explained procedure.    Mr. Spangler completed treatment without reported difficulty.  Gayleen Orem, RN, BSN, [place] at [facility] [phone]    Navigator Location: [facility] (05/03/16 1420) Navigator Encounter Type: Treatment (05/03/16 1420)           Patient Visit Type: [facility] (05/03/16 1420) Treatment Phase: First Radiation Tx (05/03/16 1420) Barriers/Navigation Needs: Education (05/03/16 1420)                          Time Spent with Patient: 30 (05/03/16 1420)

## 2016-05-05 NOTE — Progress Notes (Signed)
Weight stable. BP elevated. Denies pain. Denies pain or difficulty associated with swallowing. No skin changes noted within treatment field. Denies thick or rope like saliva. Reports fatigue.   BP (!) 144/96 (BP Location: Right Arm, Patient Position: Sitting, Cuff Size: Normal)   Pulse 90   Wt 108 lb 4.8 oz (49.1 kg)   BMI 18.59 kg/m  Wt Readings from Last 3 Encounters:  05/05/16 108 lb 4.8 oz (49.1 kg)  04/26/16 103 lb 8 oz (46.9 kg)  04/12/16 102 lb 4.8 oz (46.4 kg)

## 2016-05-05 NOTE — Progress Notes (Signed)
  Radiation Oncology         [phone]   Name: Adam Marshall MRN: [id number]   Date: 05/05/2016  DOB: 1961/01/27    Weekly Radiation Therapy Management    ICD-9-CM ICD-10-CM   1. 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 Weight stable. Patient denies pain. Patient denies pain or difficulty swallowing. No skin changes noted within the treatment field. Denies thick or rope like saliva. Reports fatigue and decreased sleep. Patient reports daily headaches at night. The patient is without complaint. Set-up films were reviewed. The chart was checked.  Physical Findings  weight is 108 lb 4.8 oz (49.1 kg). His blood pressure is 144/96 (abnormal) and his pulse is 90. . Weight essentially stable.  No significant changes. Patient was seen in a wheel chair.  Impression The patient is tolerating radiation.  Plan Continue treatment as planned.  Pt to follow with PCP for elevated BP.         Sheral Apley [name], M.D. This document serves as a record of services personally performed by [name], MD. It was created on his behalf by Bethann Humble, a trained medical scribe. The creation of this record is based on the scribe's personal observations and the provider's statements to them. This document has been checked and approved by the attending provider.

## 2016-05-05 NOTE — Telephone Encounter (Signed)
Adam Marshall called in said that pt had to go over to the cancer center yesterday and they told him that his BP was high and needed to go back on bp meds.  Adam Marshall wanted to know if this med can be called back in for this pt ?

## 2016-05-06 ENCOUNTER — Ambulatory Visit
Admission: RE | Admit: 2016-05-06 | Discharge: 2016-05-06 | Disposition: A | Discharge status: Home / Self Care | Payer: Medicare HMO | Source: Ambulatory Visit | Attending: Radiation Oncology | Admitting: Radiation Oncology

## 2016-05-06 DIAGNOSIS — Z510 Encounter for antineoplastic radiation therapy: Secondary | ICD-10-CM | POA: Diagnosis not present

## 2016-05-07 ENCOUNTER — Other Ambulatory Visit: Payer: Self-pay | Admitting: Radiation Oncology

## 2016-05-10 ENCOUNTER — Telehealth: Payer: Self-pay | Admitting: Family

## 2016-05-10 ENCOUNTER — Ambulatory Visit
Admission: RE | Admit: 2016-05-10 | Discharge: 2016-05-10 | Disposition: A | Discharge status: Home / Self Care | Payer: Medicare HMO | Source: Ambulatory Visit | Attending: Radiation Oncology | Admitting: Radiation Oncology

## 2016-05-10 ENCOUNTER — Telehealth: Payer: Self-pay | Admitting: Radiation Oncology

## 2016-05-10 DIAGNOSIS — Z510 Encounter for antineoplastic radiation therapy: Secondary | ICD-10-CM | POA: Diagnosis not present

## 2016-05-10 MED ORDER — PROPRANOLOL HCL 10 MG PO TABS: 10.0000 mg | ORAL_TABLET | Freq: Two times a day (BID) | ORAL | 0 refills | Status: DC

## 2016-05-10 NOTE — Telephone Encounter (Signed)
Rx sent 

## 2016-05-10 NOTE — Telephone Encounter (Signed)
States patient is seeing Dr. [name].  Last seen on 8/31 and BP was elevated to 144/96.  States that patient needs to follow up with primary care for elevated BP.  Please see Dr. [name] notes from that [place].  Patient called them today stating they could not get refill on BP medication.

## 2016-05-10 NOTE — Telephone Encounter (Signed)
Received message that patient's PCP refused to refill Allopurinol. Phoned Terri Piedra, [place] office and spoke with [place]. Tammy committed to relay to [place], Therapist, sports for [employer] that the patient's BP has been consistently elevated in our office thus he was encouraged to follow up with his PCP. Also, explained the allopurinol was given to manage bp until patient could be seen by [facility]. Tammy confirms their office will reach out to the patient for management.

## 2016-05-11 ENCOUNTER — Ambulatory Visit
Admission: RE | Admit: 2016-05-11 | Discharge: 2016-05-11 | Disposition: A | Discharge status: Home / Self Care | Payer: Medicare HMO | Source: Ambulatory Visit | Attending: Radiation Oncology | Admitting: Radiation Oncology

## 2016-05-11 DIAGNOSIS — Z510 Encounter for antineoplastic radiation therapy: Secondary | ICD-10-CM | POA: Diagnosis not present

## 2016-05-12 ENCOUNTER — Ambulatory Visit: Payer: Medicare HMO | Admitting: Nutrition

## 2016-05-12 ENCOUNTER — Telehealth: Payer: Self-pay | Admitting: *Deleted

## 2016-05-12 ENCOUNTER — Ambulatory Visit
Admission: RE | Admit: 2016-05-12 | Discharge: 2016-05-12 | Disposition: A | Discharge status: Home / Self Care | Payer: Medicare HMO | Source: Ambulatory Visit | Attending: Radiation Oncology | Admitting: Radiation Oncology

## 2016-05-12 ENCOUNTER — Encounter: Payer: Self-pay | Admitting: Radiation Oncology

## 2016-05-12 DIAGNOSIS — Z510 Encounter for antineoplastic radiation therapy: Secondary | ICD-10-CM | POA: Diagnosis not present

## 2016-05-12 NOTE — Progress Notes (Signed)
Nutrition follow-up completed with patient after radiation therapy for head and neck cancer. Weight has improved and was documented as 108.4 pounds on September 7 increased from 102.2 pounds August 2. Patient is eating well and drinking 3 ensure plus daily. He denies nutrition impact symptoms.  Nutrition diagnosis: Severe malnutrition, improved.  Intervention: Educated patient to increase calories and protein at meals and snacks and increase Ensure Plus 4 times a day Encouraged patient to modify textures and temperatures as needed. Recommended patient continue baking soda and salt water rinses. Questions were answered.  Teach back method used.  Samples were provided.  Monitoring, evaluation, goals:  Patient will tolerate increased calories and protein to promote continued weight gain.  Next visit: Thursday, September 14 infusion.  **Disclaimer: This note was dictated with voice recognition software. Similar sounding words can inadvertently be transcribed and this note may contain transcription errors which may not have been corrected upon publication of note.**

## 2016-05-12 NOTE — Telephone Encounter (Signed)
  Oncology Nurse Navigator Documentation  Met with Adam Marshall and his wife after [facility] tmt to check on his well-being. He stated:  Tolerating tmts without difficulty.  Denies swallowing difficulty.  Eating well (recently mashed potatoes with gravy, salisbury steak, eggs, milkshakes, Ensure).  Has gained 5 lbs.  Using salt water/baking soda rinse several times daily to help manage thickened saliva. I provided updated Epic appointment calendar, noted I had arranged for them to meet with LCSW Polo Riley next Tuesday after RT to complete Advance Directives.  I showed them new location for [facility] support staff.  Gayleen Orem, RN, BSN, [place] at [facility] [phone]    Navigator Location: [facility] [phone]/07/17 1445) Navigator Encounter Type: Treatment (05/12/16 1445)           Patient Visit Type: RadOnc (05/12/16 1445) Treatment Phase: Treatment (05/12/16 1445)                            Time Spent with Patient: 30 (05/12/16 1445)

## 2016-05-12 NOTE — Progress Notes (Signed)
Met with Adam Marshall and got him signed up for the [employer] and gave him info on [employer] along with phone number to billing to set it up.

## 2016-05-13 ENCOUNTER — Encounter: Payer: Self-pay | Admitting: *Deleted

## 2016-05-13 ENCOUNTER — Encounter: Payer: Self-pay | Admitting: Radiation Oncology

## 2016-05-13 ENCOUNTER — Ambulatory Visit
Admission: RE | Admit: 2016-05-13 | Discharge: 2016-05-13 | Disposition: A | Discharge status: Home / Self Care | Payer: Medicare HMO | Source: Ambulatory Visit | Attending: Radiation Oncology | Admitting: Radiation Oncology

## 2016-05-13 VITALS — BP 152/95 | HR 89 | Temp 97.7°F | Resp 16 | Ht 64.0 in | Wt 109.9 lb

## 2016-05-13 DIAGNOSIS — C139 Malignant neoplasm of hypopharynx, unspecified: Secondary | ICD-10-CM

## 2016-05-13 DIAGNOSIS — Z510 Encounter for antineoplastic radiation therapy: Secondary | ICD-10-CM | POA: Diagnosis not present

## 2016-05-13 MED ORDER — HYDROCODONE-ACETAMINOPHEN 7.5-325 MG/15ML PO SOLN: 10.0000 mL | Freq: Four times a day (QID) | ORAL | 0 refills | Status: DC | PRN

## 2016-05-13 MED FILL — HYDROCOD-APAP 7.5-325/15ML: 7.5-325 | 7 days supply | Qty: 473 | Fill #0

## 2016-05-13 NOTE — Progress Notes (Signed)
Weight stable. BP elevated.  Reports back 7/10.   Had    difficulty  Swallowing after eating dinner last night. No skin changes noted within treatment field.  Noticed thick oand rope like saliva last night. Reports fatigue all during the day.  Drinking Ensure TID. Wt Readings from Last 3 Encounters:  05/13/16 109 lb 14.4 oz (49.9 kg)  05/12/16 108 lb 6.4 oz (49.2 kg)  05/05/16 108 lb 4.8 oz (49.1 kg)  BP (!) 152/95 (BP Location: Left Arm, Patient Position: Sitting, Cuff Size: Normal)   Pulse 89   Temp 97.7 F (36.5 C) (Oral)   Resp 16   Ht 5\' 4"  (1.626 m)   Wt 109 lb 14.4 oz (49.9 kg)   SpO2 100%   BMI 18.86 kg/m

## 2016-05-13 NOTE — Progress Notes (Signed)
Oncology Nurse Navigator Documentation  Met with Adam Marshall and his SO prior to his WUT with Dr. [name].  We discussed his attendance at next Tuesday morning's MDC to see SLP and PT.  I provided a Head & Neck [facility]-disciplinary [facility] information sheet with noted 8:00 AM arrival to Radiation Waiting.   They voiced understanding.  Gayleen Orem, RN, BSN, [place] at [place] [phone]

## 2016-05-13 NOTE — Progress Notes (Signed)
  Radiation Oncology         [phone]   Name: Adam Marshall MRN: [id number]   Date: 05/13/2016  DOB: 1961/05/24    Weekly Radiation Therapy Management    ICD-9-CM ICD-10-CM   1. Cancer of hypopharynx (HCC) 148.9 C13.9     Current Dose: 16 Gy  Planned Dose:  44 Gy  Narrative The patient presents for routine under treatment assessment.  Weight stable. Patient notes he had difficulty swallowing after eating dinner last night. Patient is taking Ensure. No skin changes noted within treatment field. Noticed thick and rope like saliva last night. Reports 7/10 upper back pain. Reports fatigue.   Set-up films were reviewed. The chart was checked.  Physical Findings  height is 5\' 4"  (1.626 m) and weight is 109 lb 14.4 oz (49.9 kg). His oral temperature is 97.7 F (36.5 C). His blood pressure is 152/95 (abnormal) and his pulse is 89. His respiration is 16 and oxygen saturation is 100%. . Weight essentially stable.  No significant changes. Patient was seen in a wheel chair.  Impression The patient is tolerating radiation.  Plan Continue treatment as planned. Placed an order for liquid hydrocodone.   Sheral Apley [name], M.D. This document serves as a record of services personally performed by [name], MD. It was created on his behalf by Bethann Humble, a trained medical scribe. The creation of this record is based on the scribe's personal observations and the provider's statements to them. This document has been checked and approved by the attending provider.

## 2016-05-15 ENCOUNTER — Other Ambulatory Visit: Payer: Self-pay | Admitting: Family

## 2016-05-16 ENCOUNTER — Ambulatory Visit
Admission: RE | Admit: 2016-05-16 | Discharge: 2016-05-16 | Disposition: A | Discharge status: Home / Self Care | Payer: Medicare HMO | Source: Ambulatory Visit | Attending: Radiation Oncology | Admitting: Radiation Oncology

## 2016-05-16 DIAGNOSIS — Z510 Encounter for antineoplastic radiation therapy: Secondary | ICD-10-CM | POA: Diagnosis not present

## 2016-05-17 ENCOUNTER — Ambulatory Visit
Admission: RE | Admit: 2016-05-17 | Discharge: 2016-05-17 | Disposition: A | Discharge status: Home / Self Care | Payer: Medicare HMO | Source: Ambulatory Visit | Attending: Radiation Oncology | Admitting: Radiation Oncology

## 2016-05-17 ENCOUNTER — Encounter: Payer: Self-pay | Admitting: *Deleted

## 2016-05-17 ENCOUNTER — Ambulatory Visit: Payer: Medicare HMO | Admitting: Nutrition

## 2016-05-17 ENCOUNTER — Ambulatory Visit: Payer: Medicare HMO

## 2016-05-17 ENCOUNTER — Ambulatory Visit: Payer: Medicare HMO | Attending: Radiation Oncology | Admitting: Physical Therapy

## 2016-05-17 VITALS — BP 142/93 | HR 107 | Temp 98.2°F | Ht 64.0 in | Wt 109.6 lb

## 2016-05-17 DIAGNOSIS — R1310 Dysphagia, unspecified: Secondary | ICD-10-CM | POA: Insufficient documentation

## 2016-05-17 DIAGNOSIS — Z510 Encounter for antineoplastic radiation therapy: Secondary | ICD-10-CM | POA: Diagnosis not present

## 2016-05-17 DIAGNOSIS — C12 Malignant neoplasm of pyriform sinus: Secondary | ICD-10-CM

## 2016-05-17 NOTE — Progress Notes (Signed)
Nutrition follow-up completed with patient during head and neck clinic. Patient is receiving radiation therapy for head and neck cancer. Weight improved documented as 109.6 pounds September 12 increased from 108.4 pounds September 7. Patient continues to eat well but is taking longer. He is drinking Ensure Plus 3 times a day. He denies constipation. He is reporting thick saliva causing him to gag and occasionally spit up.  Nutrition diagnosis: Severe malnutrition, improved.  Intervention: Encouraged patient to continue adequate calories and protein. Recommended patient consider pured foods for easier swallowing. Recommended patient increase Ensure Plus or equivalent 4 times daily. Encouraged patient to continue baking soda and salt water rinses. Questions were answered.  Teach back method used.  Monitoring, evaluation, goals: Patient will work to increase calories and protein for weight stabilization/weight gain.  Next visit: Wednesday, September 20 after radiation therapy.  **Disclaimer: This note was dictated with voice recognition software. Similar sounding words can inadvertently be transcribed and this note may contain transcription errors which may not have been corrected upon publication of note.**

## 2016-05-17 NOTE — Patient Instructions (Signed)
SWALLOWING EXERCISES Do these until 6 months after your last day of radiation, then 2 times per week afterwards  1. Effortful Swallows - Press your tongue against the roof of your mouth for 3 seconds, then squeeze          the muscles in your neck while you swallow your saliva or a sip of water - Repeat 15-20 times, 2-3 times a day, and use whenever you eat or drink  2. Masako Swallow - swallow with your tongue sticking out - Stick tongue out past your teeth and gently bite tongue with your teeth - Swallow, while holding your tongue with your teeth - Repeat 15-20 times, 2-3 times a day *use a wet spoon if your mouth gets dry*  3. Pitch Raise - Repeat "he", once per second in as high of a pitch as you can - Repeat 20 times, 2-3 times a day  4. Shaker Exercise - head lift - Lie flat on your back in your bed or on a couch without pillows - Raise your head and look at your feet - KEEP YOUR SHOULDERS DOWN - HOLD FOR 45 SECONDS, then lower your head back down - Repeat 3 times, 2-3 times a day  5. Mendelsohn Maneuver - "half swallow" exercise - Start to swallow, and keep your Adam's apple up by squeezing hard with the            muscles of the throat - Hold the squeeze for 5-7 seconds and then relax - Repeat 15-20 times, 2-3 times a day *use a wet spoon if your mouth gets dry*  6. Breath Hold - Say "HUH!" loudly, then hold your breath for 3 seconds at your voice box - Repeat 20 times, 2-3 times a day  7. Chin pushback - Open your mouth  - Place your fist UNDER your chin near your neck, and push back with your fist for 5 seconds - Repeat 10 times, 2-3 times a day

## 2016-05-17 NOTE — Therapy (Signed)
[facility] [address] [place], [place], [address] Phone: [phone]   Fax:  [phone]  Speech Language Pathology Evaluation  Patient Details  Name: Adam Marshall MRN: [id number] Date of Birth: [birth year] Referring Provider: [name]  Encounter Date: 05/17/2016      End of Session - 05/17/16 1204    Visit Number 1  eval 05-17-16   Number of Visits 3   Date for SLP Re-Evaluation 08/05/16   SLP Start Time 0930   SLP Stop Time  1012   SLP Time Calculation (min) 42 min   Activity Tolerance Patient limited by pain  (during swallow)      Past Medical History:  Diagnosis Date  . Abnormal LFTs   . 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 Chronic back pain   . Chronic knee pain    left  . Cirrhosis ([place])   . Fatty liver   . Gout   . Hiatal hernia   . Hypertension   . Pancreatic lesion   . Pancreatitis   . Prostate cancer [facility])     Past Surgical History:  Procedure Laterality Date  . ESOPHAGOGASTRODUODENOSCOPY N/A 03/15/2016   Procedure: ESOPHAGOGASTRODUODENOSCOPY (EGD);  Surgeon: Doran Stabler, MD;  Location: [facility];  Service: Endoscopy;  Laterality: N/A;  . KNEE SURGERY     left  . PANENDOSCOPY N/A 03/18/2016   Procedure: DIRECT LARYNGOSCOPY, WITH BRONCHOSCOPY, AND ESOPHAGOSCOPY, WITH BIOPSY;  Surgeon: [name], MD;  Location: [facility];  Service: ENT;  Laterality: N/A;  . PROSTATE BIOPSY      There were no vitals filed for this visit.      Subjective Assessment - 05/17/16 0940    Subjective Thick saliva is making it difficult for pharyngeal passage. Pt has undergone 14 rad therapies thus far.   Currently in Pain? No/denies            SLP Evaluation [facility] - 05/17/16 0940      SLP Visit Information   SLP Received On 05/17/16   Referring Provider [name]   Medical Diagnosis Pyriform sinus cancer     Prior Functional Status   Cognitive/Linguistic Baseline Within functional limits     Cognition   Overall Cognitive Status Within Functional Limits for tasks assessed     Auditory Comprehension   Overall Auditory Comprehension Appears within functional limits for tasks assessed     Verbal Expression   Overall Verbal Expression Appears within functional limits for tasks assessed     Oral Motor/Sensory Function   Overall Oral Motor/Sensory Function Appears within functional limits for tasks assessed     Motor Speech   Overall Motor Speech Appears within functional limits for tasks assessed      Pt currently tolerates softer diet and thin liquids, which has begun in the last week due to odynophagia. Prior to this pt was eating more meats and regular food items.  POs: Pt ate applesauce and drank water without overt s/s aspiration. Thyroid elevation appeared WNL, and swallows appeared timely. Pt without overt s/s oral residue. Pt's swallow deemed WFL at this time. Pt complained of difficulty swallowing due to thick saliva, but this was not observed today.  Because data states the risk for dysphagia during and after radiation treatment is high due to undergoing radiation tx, SLP taught pt about the possibility of reduced/limited ability for PO intake during rad tx. SLP encouraged pt to continue swallowing POs  as far into rad tx as possible, even ingesting POs and/or completing HEP shortly after administration of pain meds.   SLP educated pt re: changes to swallowing musculature after rad tx, and why adherence to dysphagia HEP provided today and PO consumption was necessary to inhibit muscular disuse atrophy and to reduce muscle fibrosis following rad tx. Pt demonstrated understanding of these things to SLP.    SLP then developed a HEP for pt and pt was instructed how to perform exercises involving lingual, vocal, and pharyngeal strengthening. SLP performed each exercise and pt return demonstrated each exercise. SLP ensured  pt performance was correct prior to moving on to next exercise. Pt was instructed to complete this program 2-3 times a day,  until 6 months after their last rad tx, then x2 a week after that.                     SLP Education - 05-30-16 1203    Education provided Yes   Education Details Dysphagia HEP, late effects head/neck radiation on swallow function   Person(s) Educated Patient;Spouse   Methods Explanation   Comprehension Verbalized understanding          SLP Short Term Goals - 2016/05/30 1209      SLP SHORT TERM GOAL #1   Title pt will tell SLP why he is completing HEP with rare min A   Time 1   Period --  therapy visit   Status New     SLP SHORT TERM GOAL #2   Title pt will complete dysphagia HEP with rare min A   Time 1   Period --  therapy visit   Status New          SLP Long Term Goals - 05/30/2016 1209      SLP LONG TERM GOAL #1   Title pt will complete HEP with independence   Time 2   Period --  therapy visits (3rd session total)   Status New     SLP LONG TERM GOAL #2   Title pt will tell SLP how a food journal can assist return to full and regular PO diet   Time 2   Period --  therapy sessions   Status New          Plan - May 30, 2016 1205    Clinical Impression Statement Pt presents with odynophagia caused by s/e from 14 rad tx sessions. His oropharyngeal swallow appears WNL at this time, although pt complained of thick mucous-saliva casuing pharyngeal clearance issues. This was not seen during eval today with applesauce and water boluses. Pt would benefit from cont'd skilled ST to assess proper completion of HEP and to assess safety with current PO.    Speech Therapy Frequency --  once approx every four weeks   Duration --  three therapy sessions in <90 days   Treatment/Interventions Aspiration precaution training;Pharyngeal strengthening exercises;Diet toleration management by SLP;Compensatory techniques;SLP instruction and  feedback;Trials of upgraded texture/liquids;Compensatory strategies;Patient/family education  any or all may be used in theapy sessions   Potential to Achieve Goals Good      Patient will benefit from skilled therapeutic intervention in order to improve the following deficits and impairments:   Dysphagia      G-Codes - 05-30-16 1211    Functional Assessment Tool Used NOMS (approx 20% impaired)   Functional Limitations Swallowing   Swallow Current Status [id number]) At least 20 percent but less than 40 percent impaired, limited or restricted  Swallow Goal Status [phone]) At least 1 percent but less than 20 percent impaired, limited or restricted      Problem List Patient Active Problem List   Diagnosis Date Noted  . 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 Weakness 03/26/2016  . Gout 03/26/2016  . 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 HCAP (healthcare-associated pneumonia) 03/23/2016  . Hypokalemia 03/23/2016  . Hypomagnesemia 03/23/2016  . Protein-calorie malnutrition, severe ([place]) 03/23/2016  . Reflux esophagitis   . Pancreatic lesion   . Normochromic normocytic anemia   . Pyriform sinus mass   . Ascites   . Cirrhosis of liver with ascites ([place]) 03/14/2016  . Anemia due to chronic blood loss 03/14/2016  . Respiratory failure with hypoxia ([place]) 03/14/2016  . Lesion of pancreas   . Generalized abdominal pain   . Hyponatremia   . Abscess of skin of abdomen 01/13/2016  . Leg cramping 01/13/2016  . Abnormal LFTs (liver function tests) 10/13/2015  . Alcohol use ([place]) 10/13/2015  . Left knee pain 02/04/2015  . Encounter for general adult medical examination with abnormal findings 12/25/2014  . Medicare annual wellness visit, subsequent 12/25/2014  . Knee pain 12/08/2014  . Chronic pancreatitis ([place]) 05/03/2011  . Rhinorrhea 04/24/2011  . PREMATURE VENTRICULAR CONTRACTIONS 07/07/2010  . HYPERKALEMIA 06/29/2010  . ACUTE GOUTY ARTHROPATHY 06/28/2010  .  HYPERTENSION, BENIGN ESSENTIAL 06/28/2010    [facility] ,[place], CCC-SLP  05/17/2016, 12:11 PM  [facility] [address] [place], [place], [address] Phone: [phone]   Fax:  [phone]  Name: Adam Marshall MRN: [id number] Date of Birth: 1961/06/10

## 2016-05-18 ENCOUNTER — Ambulatory Visit
Admission: RE | Admit: 2016-05-18 | Discharge: 2016-05-18 | Disposition: A | Discharge status: Home / Self Care | Payer: Medicare HMO | Source: Ambulatory Visit | Attending: Radiation Oncology | Admitting: Radiation Oncology

## 2016-05-18 ENCOUNTER — Other Ambulatory Visit: Payer: Self-pay | Admitting: Radiation Oncology

## 2016-05-18 DIAGNOSIS — Z510 Encounter for antineoplastic radiation therapy: Secondary | ICD-10-CM | POA: Diagnosis not present

## 2016-05-18 NOTE — Progress Notes (Signed)
[place] Social Work  Clinical Social Work was referred by navigator to review and complete healthcare advance directives.  Clinical Social Worker met with patient and patient's significant other in [place] office.  The patient designated Danna Hefty, significant other, as their primary healthcare agent and no secondary agent.  Patient also completed healthcare living will. Patient does not desire life prolonging measures in end of life situation.  Clinical Social Worker notarized documents and made copies for patient/family. Clinical Social Worker will send documents to medical records to be scanned into patient's chart. Clinical Social Worker encouraged patient/family to contact with any additional questions or concerns.  Polo Riley, MSW, [place] Worker [facility] [phone]

## 2016-05-19 ENCOUNTER — Encounter: Payer: Self-pay | Admitting: *Deleted

## 2016-05-19 ENCOUNTER — Encounter: Payer: Self-pay | Admitting: Nutrition

## 2016-05-19 ENCOUNTER — Telehealth: Payer: Self-pay | Admitting: Emergency Medicine

## 2016-05-19 ENCOUNTER — Ambulatory Visit
Admission: RE | Admit: 2016-05-19 | Discharge: 2016-05-19 | Disposition: A | Discharge status: Home / Self Care | Payer: Medicare HMO | Source: Ambulatory Visit | Attending: Radiation Oncology | Admitting: Radiation Oncology

## 2016-05-19 DIAGNOSIS — L02211 Cutaneous abscess of abdominal wall: Secondary | ICD-10-CM

## 2016-05-19 DIAGNOSIS — Z510 Encounter for antineoplastic radiation therapy: Secondary | ICD-10-CM | POA: Diagnosis not present

## 2016-05-19 NOTE — Telephone Encounter (Signed)
Pt needs a prescription refill on mupirocin cream (BACTROBAN) 2 %, cyclobenzaprine (FLEXERIL) 5 MG tablet, propranolol (INDERAL) 10 MG tablet and  colchicine 0.6 MG tablet. Pharmacy is [facility]. Please advise thanks.

## 2016-05-20 ENCOUNTER — Ambulatory Visit: Payer: Medicare HMO

## 2016-05-20 ENCOUNTER — Other Ambulatory Visit: Payer: Self-pay | Admitting: Internal Medicine

## 2016-05-20 ENCOUNTER — Ambulatory Visit: Payer: Medicare HMO | Admitting: Radiation Oncology

## 2016-05-20 MED ORDER — CYCLOBENZAPRINE HCL 5 MG PO TABS: 5.0000 mg | ORAL_TABLET | Freq: Three times a day (TID) | ORAL | 1 refills | Status: DC | PRN

## 2016-05-20 MED ORDER — MUPIROCIN CALCIUM 2 % EX CREA: 1.0000 "application " | TOPICAL_CREAM | Freq: Two times a day (BID) | CUTANEOUS | 0 refills | Status: DC

## 2016-05-20 MED ORDER — PROPRANOLOL HCL 10 MG PO TABS: ORAL_TABLET | ORAL | 0 refills | Status: DC

## 2016-05-20 MED ORDER — COLCHICINE 0.6 MG PO TABS: 0.6000 mg | ORAL_TABLET | Freq: Every day | ORAL | 0 refills | Status: DC

## 2016-05-20 NOTE — Progress Notes (Signed)
Oncology Nurse Navigator Documentation  Met with Mr. Cale during H&N [place].  He was accompanied by his [place].  Provided verbal and written overview of [place], the clinicians who will be seeing him, encouraged him to ask questions during his time with them.  He was seen by Nutrition, SLP, and SW.  He was unable to stay for PT as [facility] [place] had an appoint for which they had to leave.  Gayleen Orem, RN, BSN, [place] at [facility] [phone]

## 2016-05-20 NOTE — Telephone Encounter (Signed)
Rx sent 

## 2016-05-20 NOTE — Progress Notes (Signed)
Oncology Nurse Navigator Documentation  Met with Adam Marshall and his SO in [place].  SO showed me bottles for Rx that are in need of refills.  I explained that the scripts were originally issued by Adam Marshall's PCP, they should request refills with him/her.  I addressed her misunderstanding that these were "cancer medicines" prescribed by Adam Marshall.  She voiced understanding they need to see PCP, indicated "his office across the street, we'll go over there next".  Gayleen Orem, RN, BSN, [place] at [place] [phone]

## 2016-05-23 ENCOUNTER — Ambulatory Visit: Admission: RE | Admit: 2016-05-23 | Payer: Medicare HMO | Source: Ambulatory Visit | Admitting: Radiation Oncology

## 2016-05-23 ENCOUNTER — Ambulatory Visit: Admission: RE | Admit: 2016-05-23 | Payer: Medicare HMO | Source: Ambulatory Visit

## 2016-05-23 ENCOUNTER — Telehealth: Payer: Self-pay | Admitting: Gastroenterology

## 2016-05-23 NOTE — Telephone Encounter (Signed)
Pt requesting refills on Protonix 40 mg BID and refills on lactulose 10 g packets one a day. Last seen on 04-26-2016. Please advise.

## 2016-05-24 ENCOUNTER — Ambulatory Visit
Admission: RE | Admit: 2016-05-24 | Discharge: 2016-05-24 | Disposition: A | Discharge status: Home / Self Care | Payer: Medicare HMO | Source: Ambulatory Visit | Attending: Radiation Oncology | Admitting: Radiation Oncology

## 2016-05-24 DIAGNOSIS — Z510 Encounter for antineoplastic radiation therapy: Secondary | ICD-10-CM | POA: Diagnosis not present

## 2016-05-24 MED ORDER — LACTULOSE 10 G PO PACK: 10.0000 g | PACK | Freq: Every day | ORAL | 6 refills | Status: DC

## 2016-05-24 MED ORDER — PANTOPRAZOLE SODIUM 40 MG PO TBEC: 40.0000 mg | DELAYED_RELEASE_TABLET | Freq: Every day | ORAL | 2 refills | Status: DC

## 2016-05-24 NOTE — Telephone Encounter (Signed)
Refills have been filled as directed by Dr [name]

## 2016-05-24 NOTE — Telephone Encounter (Signed)
Protonix 40 mg once daily will be sufficient.  One month supply with 2 refills.  Lactulose 10 gram packet, take once daily.  One month supply with 6 refills.

## 2016-05-25 ENCOUNTER — Ambulatory Visit
Admission: RE | Admit: 2016-05-25 | Discharge: 2016-05-25 | Disposition: A | Discharge status: Home / Self Care | Payer: Medicare HMO | Source: Ambulatory Visit | Attending: Radiation Oncology | Admitting: Radiation Oncology

## 2016-05-25 ENCOUNTER — Ambulatory Visit: Payer: Medicare HMO | Admitting: Nutrition

## 2016-05-25 DIAGNOSIS — Z510 Encounter for antineoplastic radiation therapy: Secondary | ICD-10-CM | POA: Diagnosis not present

## 2016-05-25 NOTE — Progress Notes (Signed)
Brief nutrition follow up completed with patient. Reports he is eating well.  No recent weight documented. Denies nutrition impact symptoms. Patient is consuming Ensure Plus TID.  Nutrition Diagnosis: Severe malnutrition improved.  Intervention: Educated patient to continue strategies for increased oral intake. Provided 2nd complimentary case of Ensure Plus.  Monitoring, Evaluation, Goals: Patient will continue increased calories and protein for weight gain/stabilization.  Next Visit: Tuesday, September 26.

## 2016-05-26 ENCOUNTER — Encounter: Payer: Self-pay | Admitting: Radiation Oncology

## 2016-05-26 ENCOUNTER — Ambulatory Visit
Admission: RE | Admit: 2016-05-26 | Discharge: 2016-05-26 | Disposition: A | Discharge status: Home / Self Care | Payer: Medicare HMO | Source: Ambulatory Visit | Attending: Radiation Oncology | Admitting: Radiation Oncology

## 2016-05-26 ENCOUNTER — Other Ambulatory Visit: Payer: Self-pay | Admitting: Radiation Oncology

## 2016-05-26 VITALS — BP 149/89 | HR 89 | Temp 98.3°F | Resp 16 | Wt 109.3 lb

## 2016-05-26 DIAGNOSIS — C139 Malignant neoplasm of hypopharynx, unspecified: Secondary | ICD-10-CM

## 2016-05-26 DIAGNOSIS — Z510 Encounter for antineoplastic radiation therapy: Secondary | ICD-10-CM | POA: Diagnosis not present

## 2016-05-26 MED ORDER — SUCRALFATE 1 G PO TABS: 1.0000 g | ORAL_TABLET | Freq: Three times a day (TID) | ORAL | 1 refills | Status: DC

## 2016-05-26 MED ORDER — HYDROCODONE-ACETAMINOPHEN 7.5-325 MG/15ML PO SOLN: 10.0000 mL | Freq: Four times a day (QID) | ORAL | 0 refills | Status: DC | PRN

## 2016-05-26 MED FILL — HYDROCOD-APAP 7.5-325/15ML: 7.5-325 | 7 days supply | Qty: 473 | Fill #0

## 2016-05-26 MED FILL — SUCRALFATE 1 GM TABLET: 1 | 30 days supply | Qty: 120 | Fill #0

## 2016-05-26 NOTE — Progress Notes (Signed)
Weight and vitals stable. Reports frontal headache 4 on a scale of 0-10. Reports headaches are so severe at night he is unable to sleep. Reports he continues Ensure Plus TID. Faint hyperpigmentation anterior neck noted without desquamation. Reports using Sonafine as directed. Reports a sore throat and pain with swallowing. Requesting refill of Hycet. Reports baking soda rinse has help minimize thick rope like saliva. No bowel complaints. Reports fatigue late afternoon. Reports intermittent back pain both upper and lower back. Significant other reports all routine medications have been refilled by PCP except for Flexeril.   BP (!) 149/89 (BP Location: Left Arm, Patient Position: Sitting, Cuff Size: Normal)   Pulse 89   Temp 98.3 F (36.8 C) (Oral)   Resp 16   Wt 109 lb 4.8 oz (49.6 kg)   SpO2 100%   BMI 18.76 kg/m  Wt Readings from Last 3 Encounters:  05/26/16 109 lb 4.8 oz (49.6 kg)  05/17/16 109 lb 9.6 oz (49.7 kg)  05/13/16 109 lb 14.4 oz (49.9 kg)

## 2016-05-26 NOTE — Progress Notes (Signed)
  Radiation Oncology         [phone] ________________________________  Name: Adam Marshall MRN: [id number]  Date: 05/26/2016  DOB: 1960-11-13  Weekly Radiation Therapy Management    ICD-9-CM ICD-10-CM   1. Cancer of hypopharynx (HCC) 148.9 C13.9      Current Dose: 30 Gy     Planned Dose:  44+ Gy  Narrative . . . . . . . . The patient presents for routine under treatment assessment.                              Reports a frontal headache as a 4/10. Reports headaches are so severe at night that he is unable to sleep. Reports he continues Ensure Plus TID. Faint hyperpigmentation anterior neck noted by the nurse without desquamation. Reports using Sonafine as directed. Reports a sore throat and pain with swallowing. He is requesting a refill of Hycet. Reports baking soda rinse has helped minimize thick rope like saliva. No bowel complaints. Reports fatigue in the late afternoon. Reports intermittent upper and lower back pain. His significant other reports all routine medications have been refilled by his PCP except for Flexeril.                                  Set-up films were reviewed.                                 The chart was checked. Physical Findings. . .  weight is 109 lb 4.8 oz (49.6 kg). His oral temperature is 98.3 F (36.8 C). His blood pressure is 149/89 (abnormal) and his pulse is 89. His respiration is 16 and oxygen saturation is 100%. . Weight essentially stable. The patient presents to the clinic in a wheelchair. Oropharynx is clear with no sign of thrush. Mild hyperpigmentation changes in the neck.  Impression . . . . . . . The patient is tolerating radiation. Plan . . . . . . . . . . . . Continue treatment as planned. The patient's Hycet has been refilled and a prescription of Carafate has been filled. ________________________________   Blair Promise, PhD, MD  This document serves as a record of services personally performed by Gery Pray, MD. It was  created on his behalf by Darcus Austin, a trained medical scribe. The creation of this record is based on the scribe's personal observations and the provider's statements to them. This document has been checked and approved by the attending provider.

## 2016-05-27 ENCOUNTER — Ambulatory Visit
Admission: RE | Admit: 2016-05-27 | Discharge: 2016-05-27 | Disposition: A | Discharge status: Home / Self Care | Payer: Medicare HMO | Source: Ambulatory Visit | Attending: Radiation Oncology | Admitting: Radiation Oncology

## 2016-05-27 DIAGNOSIS — Z510 Encounter for antineoplastic radiation therapy: Secondary | ICD-10-CM | POA: Diagnosis not present

## 2016-05-30 ENCOUNTER — Ambulatory Visit
Admission: RE | Admit: 2016-05-30 | Discharge: 2016-05-30 | Disposition: A | Discharge status: Home / Self Care | Payer: Medicare HMO | Source: Ambulatory Visit | Attending: Radiation Oncology | Admitting: Radiation Oncology

## 2016-05-30 DIAGNOSIS — Z510 Encounter for antineoplastic radiation therapy: Secondary | ICD-10-CM | POA: Diagnosis not present

## 2016-05-31 ENCOUNTER — Ambulatory Visit: Payer: Medicare HMO | Admitting: Nutrition

## 2016-05-31 ENCOUNTER — Ambulatory Visit
Admission: RE | Admit: 2016-05-31 | Discharge: 2016-05-31 | Disposition: A | Discharge status: Home / Self Care | Payer: Medicare HMO | Source: Ambulatory Visit | Attending: Radiation Oncology | Admitting: Radiation Oncology

## 2016-05-31 DIAGNOSIS — Z510 Encounter for antineoplastic radiation therapy: Secondary | ICD-10-CM | POA: Diagnosis not present

## 2016-05-31 NOTE — Progress Notes (Signed)
Nutrition follow-up completed with patient who reports he continues to eat well. He has been walking at home and is now able to walk longer distances. No recent weight was documented Patient denies nutrition impact symptoms. Continues to consume Ensure Plus 3 times a day.  Nutrition diagnosis: Severe malnutrition improved.  Intervention: Patient was educated to continue strategies for increased calorie intake. Provided third complementary case of Ensure Plus.  Monitoring, evaluation, goals: Patient will continue to consume increased calories and protein for weight gain/stabilization.  Next visit: Wednesday October 4 after radiation therapy.  **Disclaimer: This note was dictated with voice recognition software. Similar sounding words can inadvertently be transcribed and this note may contain transcription errors which may not have been corrected upon publication of note.**

## 2016-06-01 ENCOUNTER — Ambulatory Visit
Admission: RE | Admit: 2016-06-01 | Discharge: 2016-06-01 | Disposition: A | Discharge status: Home / Self Care | Payer: Medicare HMO | Source: Ambulatory Visit | Attending: Radiation Oncology | Admitting: Radiation Oncology

## 2016-06-01 DIAGNOSIS — Z510 Encounter for antineoplastic radiation therapy: Secondary | ICD-10-CM | POA: Diagnosis not present

## 2016-06-02 ENCOUNTER — Encounter: Payer: Self-pay | Admitting: Radiation Oncology

## 2016-06-02 ENCOUNTER — Ambulatory Visit
Admission: RE | Admit: 2016-06-02 | Discharge: 2016-06-02 | Disposition: A | Discharge status: Home / Self Care | Payer: Medicare HMO | Source: Ambulatory Visit | Attending: Radiation Oncology | Admitting: Radiation Oncology

## 2016-06-02 VITALS — BP 146/93 | HR 89 | Resp 16 | Wt 108.9 lb

## 2016-06-02 DIAGNOSIS — Z510 Encounter for antineoplastic radiation therapy: Secondary | ICD-10-CM | POA: Diagnosis not present

## 2016-06-02 DIAGNOSIS — C139 Malignant neoplasm of hypopharynx, unspecified: Secondary | ICD-10-CM

## 2016-06-02 NOTE — Progress Notes (Signed)
Weight and vitals stable. Reports front headaches and back pain 8 on a scale of 0-10. Reports pain is so severe at night he is unable to sleep. Reports headaches and back pain are only getting worse. Significant other has been unable to find someone to refill his Flexeril and questions if this is contributing to his back pain. Reports an excellent appetite. Denies bowel or bladder complaints. Reports Carafate is adequately managing discomfort associated with swallowing. Faint hyperpigmentation of anterior neck noted. Reports using sonafine as directed. Baking soda rinse manages thick ropey saliva well. Reports fatigue.   BP (!) 146/93 (BP Location: Right Arm, Patient Position: Sitting, Cuff Size: Normal)   Pulse 89   Resp 16   Wt 108 lb 14.4 oz (49.4 kg)   SpO2 100%   BMI 18.69 kg/m  Wt Readings from Last 3 Encounters:  06/02/16 108 lb 14.4 oz (49.4 kg)  05/26/16 109 lb 4.8 oz (49.6 kg)  05/17/16 109 lb 9.6 oz (49.7 kg)

## 2016-06-02 NOTE — Progress Notes (Signed)
  Radiation Oncology         [phone]   Name: Adam Marshall MRN: [id number]   Date: 06/02/2016  DOB: [birth year]    Weekly Radiation Therapy Management    ICD-9-CM ICD-10-CM   1. Cancer of hypopharynx (HCC) 148.9 C13.9     Current Dose: 40 Gy  Planned Dose:  66 Gy  Narrative The patient presents for routine under treatment assessment.  Weight and vitals stable. Reports front headaches and back pain 8/10. Reports pain is so severe he is unable to sleep at night. Reports headaches and back pain are worsening. Significant other has been unable to find someone to refill his Flexeril and questions if this is contributing to his back pain. Reports an excellent appetite. Denies bowel or bladder complaints. Reports Carafate is adequately managing discomfort associated with swallowing. Faint hyperpigmentation of anterior neck noted. Reports using Sonafine as directed. Baking soda rinse manages thick ropey saliva as well. Reports fatigue.  Set-up films were reviewed. The chart was checked.  Physical Findings  weight is 108 lb 14.4 oz (49.4 kg). His blood pressure is 146/93 (abnormal) and his pulse is 89. His respiration is 16 and oxygen saturation is 100%. . Weight essentially stable.  No significant changes. Patient was seen in a wheel chair.  Impression The patient is tolerating radiation.  Plan Continue treatment as planned.   Sheral Apley [name], M.D. This document serves as a record of services personally performed by [name], MD. It was created on his behalf by Bethann Humble, a trained medical scribe. The creation of this record is based on the scribe's personal observations and the provider's statements to them. This document has been checked and approved by the attending provider.

## 2016-06-03 ENCOUNTER — Ambulatory Visit
Admission: RE | Admit: 2016-06-03 | Discharge: 2016-06-03 | Disposition: A | Discharge status: Home / Self Care | Payer: Medicare HMO | Source: Ambulatory Visit | Attending: Radiation Oncology | Admitting: Radiation Oncology

## 2016-06-03 ENCOUNTER — Ambulatory Visit: Payer: Medicare HMO | Admitting: Radiation Oncology

## 2016-06-03 ENCOUNTER — Telehealth: Payer: Self-pay | Admitting: *Deleted

## 2016-06-03 DIAGNOSIS — Z510 Encounter for antineoplastic radiation therapy: Secondary | ICD-10-CM | POA: Diagnosis not present

## 2016-06-03 NOTE — Telephone Encounter (Signed)
CALLED PATIENT TO INFORM OF GOLD SEED PLACEMENT ON 06-23-16 @ 8:15 AM @ DR. OTTELIN'S OFFICE AND HIS SIM ON 06-30-16 @ 1 PM @ DR. MANNING'S OFFICE, LVM FOR A RETURN CALL

## 2016-06-06 ENCOUNTER — Ambulatory Visit
Admission: RE | Admit: 2016-06-06 | Discharge: 2016-06-06 | Disposition: A | Discharge status: Home / Self Care | Payer: Medicare HMO | Source: Ambulatory Visit | Attending: Radiation Oncology | Admitting: Radiation Oncology

## 2016-06-06 ENCOUNTER — Other Ambulatory Visit: Payer: Self-pay | Admitting: Radiation Oncology

## 2016-06-06 DIAGNOSIS — Z510 Encounter for antineoplastic radiation therapy: Secondary | ICD-10-CM | POA: Diagnosis not present

## 2016-06-07 ENCOUNTER — Ambulatory Visit
Admission: RE | Admit: 2016-06-07 | Discharge: 2016-06-07 | Disposition: A | Discharge status: Home / Self Care | Payer: Medicare HMO | Source: Ambulatory Visit | Attending: Radiation Oncology | Admitting: Radiation Oncology

## 2016-06-07 DIAGNOSIS — Z510 Encounter for antineoplastic radiation therapy: Secondary | ICD-10-CM | POA: Diagnosis not present

## 2016-06-08 ENCOUNTER — Ambulatory Visit
Admission: RE | Admit: 2016-06-08 | Discharge: 2016-06-08 | Disposition: A | Discharge status: Home / Self Care | Payer: Medicare HMO | Source: Ambulatory Visit | Attending: Radiation Oncology | Admitting: Radiation Oncology

## 2016-06-08 ENCOUNTER — Encounter: Payer: Self-pay | Admitting: Nutrition

## 2016-06-08 DIAGNOSIS — Z510 Encounter for antineoplastic radiation therapy: Secondary | ICD-10-CM | POA: Diagnosis not present

## 2016-06-09 ENCOUNTER — Encounter: Payer: Self-pay | Admitting: Radiation Oncology

## 2016-06-09 ENCOUNTER — Ambulatory Visit
Admission: RE | Admit: 2016-06-09 | Discharge: 2016-06-09 | Disposition: A | Discharge status: Home / Self Care | Payer: Medicare HMO | Source: Ambulatory Visit | Attending: Radiation Oncology | Admitting: Radiation Oncology

## 2016-06-09 VITALS — BP 128/92 | HR 95 | Temp 99.0°F | Resp 18 | Ht 64.0 in | Wt 108.6 lb

## 2016-06-09 DIAGNOSIS — C139 Malignant neoplasm of hypopharynx, unspecified: Secondary | ICD-10-CM

## 2016-06-09 DIAGNOSIS — Z510 Encounter for antineoplastic radiation therapy: Secondary | ICD-10-CM | POA: Diagnosis not present

## 2016-06-09 MED ORDER — OXYCODONE HCL ER 20 MG PO T12A: 20.0000 mg | EXTENDED_RELEASE_TABLET | Freq: Two times a day (BID) | ORAL | 0 refills | Status: DC

## 2016-06-09 MED ORDER — CYCLOBENZAPRINE HCL 5 MG PO TABS: 5.0000 mg | ORAL_TABLET | Freq: Three times a day (TID) | ORAL | 1 refills | Status: DC | PRN

## 2016-06-09 MED ORDER — HYDROCODONE-ACETAMINOPHEN 7.5-325 MG/15ML PO SOLN: 10.0000 mL | Freq: Four times a day (QID) | ORAL | 0 refills | Status: DC | PRN

## 2016-06-09 MED FILL — OxyCONTIN 20 MG T12A: 20 | 30 days supply | Qty: 60 | Fill #0

## 2016-06-09 MED FILL — HYDROCOD-APAP 7.5-325/15ML: 7.5-325 | 8 days supply | Qty: 473 | Fill #0

## 2016-06-09 NOTE — Progress Notes (Signed)
Weight and vitals stable. Reports front headaches, back and throat pain 10 on a scale of 0-10. Reports pain is so severe at night he is unable to sleep. Reports headaches, back and throat pain are only getting worse. Significant other has been unable to find someone to refill his Flexeril and questions if this is contributing to his back pain. Reports an excellent appetite hard to eat because of the severe throat pain.   Eating soft foods and Ensure and smoothies.  Denies bowel or bladder complaints. Reports Carafate is adequately managing discomfort associated with swallowing.  Hperpigmentation of anterior neck noted.  Reports using sonafine as directed. Baking soda rinse manages thick ropey saliva well tid. Reports fatigue in the afternoon. Wt Readings from Last 3 Encounters:  06/09/16 108 lb 9.6 oz (49.3 kg)  06/02/16 108 lb 14.4 oz (49.4 kg)  05/26/16 109 lb 4.8 oz (49.6 kg)  BP (!) 128/92 (BP Location: Right Arm, Patient Position: Sitting, Cuff Size: Normal)   Pulse 95   Temp 99 F (37.2 C) (Oral)   Resp 18   Ht 5\' 4"  (1.626 m)   Wt 108 lb 9.6 oz (49.3 kg)   SpO2 100%   BMI 18.64 kg/m

## 2016-06-09 NOTE — Progress Notes (Signed)
  Radiation Oncology         [phone]   Name: Adam Marshall MRN: [id number]   Date: 06/09/2016  DOB: April 19, 1961    Weekly Radiation Therapy Management    ICD-9-CM ICD-10-CM   1. Cancer of hypopharynx (HCC) 148.9 C13.9     Current Dose: 50 Gy  Planned Dose:  66 Gy  Narrative The patient presents for routine under treatment assessment.  Weight and vitals stable. Reports front headaches, back and throat pain 10 on a scale of 0-10. Reports pain is so severe at night he is unable to sleep. Reports headaches, back and throat pain are only getting worse. Significant other has been unable to find someone to refill his Flexeril and questions if this is contributing to his back pain. Reports an excellent appetite hard to eat because of the severe throat pain. Eating soft foods and Ensure and smoothies.  Denies bowel or bladder complaints. Reports Carafate is adequately managing discomfort associated with swallowing. Nursing notes, hyperpigmentation of anterior neck noted.  Reports using sonafine as directed. Baking soda rinse manages thick ropey saliva well tid. Reports fatigue in the afternoon.   Set-up films were reviewed. The chart was checked.  Physical Findings  height is 5\' 4"  (1.626 m) and weight is 108 lb 9.6 oz (49.3 kg). His oral temperature is 99 F (37.2 C). His blood pressure is 128/92 (abnormal) and his pulse is 95. His respiration is 18 and oxygen saturation is 100%. . Weight essentially stable.  No significant changes. Patient was seen in a wheel chair.  Impression The patient is tolerating radiation.  Plan Continue treatment as planned.  Refilled pain med and added oxycontin.   Sheral Apley [name], M.D.  This document serves as a record of services personally performed by [name], MD. It was created on his behalf by Arlyce Harman, a trained medical scribe. The creation of this record is based on the scribe's personal observations and the provider's statements to  them. This document has been checked and approved by the attending provider.

## 2016-06-10 ENCOUNTER — Ambulatory Visit
Admission: RE | Admit: 2016-06-10 | Discharge: 2016-06-10 | Disposition: A | Discharge status: Home / Self Care | Payer: Medicare HMO | Source: Ambulatory Visit | Attending: Radiation Oncology | Admitting: Radiation Oncology

## 2016-06-10 ENCOUNTER — Inpatient Hospital Stay: Admission: RE | Admit: 2016-06-10 | Payer: Self-pay | Source: Ambulatory Visit | Admitting: Radiation Oncology

## 2016-06-10 DIAGNOSIS — Z510 Encounter for antineoplastic radiation therapy: Secondary | ICD-10-CM | POA: Diagnosis not present

## 2016-06-13 ENCOUNTER — Ambulatory Visit
Admission: RE | Admit: 2016-06-13 | Discharge: 2016-06-13 | Disposition: A | Discharge status: Home / Self Care | Payer: Medicare HMO | Source: Ambulatory Visit | Attending: Radiation Oncology | Admitting: Radiation Oncology

## 2016-06-13 DIAGNOSIS — Z510 Encounter for antineoplastic radiation therapy: Secondary | ICD-10-CM | POA: Diagnosis not present

## 2016-06-14 ENCOUNTER — Ambulatory Visit
Admission: RE | Admit: 2016-06-14 | Discharge: 2016-06-14 | Disposition: A | Discharge status: Home / Self Care | Payer: Medicare HMO | Source: Ambulatory Visit | Attending: Radiation Oncology | Admitting: Radiation Oncology

## 2016-06-14 DIAGNOSIS — Z510 Encounter for antineoplastic radiation therapy: Secondary | ICD-10-CM | POA: Diagnosis not present

## 2016-06-15 ENCOUNTER — Ambulatory Visit: Payer: Medicare HMO | Admitting: Nutrition

## 2016-06-15 ENCOUNTER — Ambulatory Visit
Admission: RE | Admit: 2016-06-15 | Discharge: 2016-06-15 | Disposition: A | Discharge status: Home / Self Care | Payer: Medicare HMO | Source: Ambulatory Visit | Attending: Radiation Oncology | Admitting: Radiation Oncology

## 2016-06-15 DIAGNOSIS — Z510 Encounter for antineoplastic radiation therapy: Secondary | ICD-10-CM | POA: Diagnosis not present

## 2016-06-15 NOTE — Progress Notes (Signed)
Nutrition follow-up completed with patient and his fiance. Patient is receiving radiation therapy for left pyriform sinus/laryngeal malignancy. Patient continues to try to increase oral intake. He states he drinks approximately 3 Ensure Plus a day. He is able to tolerate very soft moist foods. Weight decreased and documented as 105.8 pounds today October 11 down from 108.9 pounds September 28. Patient is anxious to complete treatment.  Nutrition diagnosis:  Severe malnutrition continues.  Intervention: Patient educated to consume 3-4 bottles of oral nutrition supplements.  Provided additional samples. Reviewed importance of patient consuming increased calories at meals and snacks. Questions were answered.  Teach back method used.  Monitoring, evaluation, goals:  Patient will continue to increase calories and protein for weight gain./Stabilization.  Next visit: Tuesday, October 17, after radiation therapy.  **Disclaimer: This note was dictated with voice recognition software. Similar sounding words can inadvertently be transcribed and this note may contain transcription errors which may not have been corrected upon publication of note.**

## 2016-06-16 ENCOUNTER — Ambulatory Visit
Admission: RE | Admit: 2016-06-16 | Discharge: 2016-06-16 | Disposition: A | Discharge status: Home / Self Care | Payer: Medicare HMO | Source: Ambulatory Visit | Attending: Radiation Oncology | Admitting: Radiation Oncology

## 2016-06-16 ENCOUNTER — Encounter: Payer: Self-pay | Admitting: Radiation Oncology

## 2016-06-16 ENCOUNTER — Ambulatory Visit: Payer: Medicare HMO

## 2016-06-16 VITALS — BP 136/89 | HR 95 | Temp 98.3°F | Resp 16 | Ht 64.0 in | Wt 105.8 lb

## 2016-06-16 DIAGNOSIS — C139 Malignant neoplasm of hypopharynx, unspecified: Secondary | ICD-10-CM | POA: Insufficient documentation

## 2016-06-16 MED ORDER — SONAFINE EX EMUL
1.0000 "application " | Freq: Two times a day (BID) | CUTANEOUS | Status: DC
  Administered 2016-06-16: 1 via TOPICAL

## 2016-06-16 NOTE — Progress Notes (Signed)
Weight and vitals stable. Reports front headaches, back and throat pain 8 on a scale of 0-10. Reports pain is so severe at night he is unable to sleep. Reports headaches, back and throat pain are only getting worse.  Reports an excellent appetite hard to eat because of the severe throat pain.   Eating soft foods marsh potatoes,gravey other soft foods and Ensure and smoothies.  Denies bowel or bladder complaints. Reports Carafate is adequately managing discomfort associated with swallowing.  Hperpigmentation of anterior neck noted.  Reports using sonafine as directed. Baking soda rinse manages saliva well tid salva is loose not as thick. Reports fatigue in the afternoon. Wt Readings from Last 3 Encounters:  06/16/16 105 lb 12.8 oz (48 kg)  06/15/16 105 lb 12.8 oz (48 kg)  06/09/16 108 lb 9.6 oz (49.3 kg)  BP 136/89 (BP Location: Right Arm, Patient Position: Sitting, Cuff Size: Normal)   Pulse 95   Temp 98.3 F (36.8 C) (Oral)   Resp 16   Ht 5\' 4"  (1.626 m)   Wt 105 lb 12.8 oz (48 kg)   SpO2 100%   BMI 18.16 kg/m

## 2016-06-16 NOTE — Progress Notes (Signed)
  Radiation Oncology         [phone]   Name: Adam Marshall MRN: [id number]   Date: 06/16/2016  DOB: 07-04-1961    Weekly Radiation Therapy Management    ICD-9-CM ICD-10-CM   1. 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  Weight and vitals stable. Reports front headaches, back and throat pain 8 on a scale of 0-10, down from a 10 before Oxycontin prescription. Reports pain is so severe at night he is unable to sleep. Reports headaches, back and throat pain are only getting worse.  Reports an excellent appetite hard to eat because of the severe throat pain.   Eating soft foods marsh potatoes, gravy, other soft foods and Ensure and smoothies.  Denies bowel or bladder complaints. Reports Carafate is adequately managing discomfort associated with swallowing.  Nursing notes hyperpigmentation of anterior neck.  Reports using sonafine as directed. Baking soda rinse manages saliva well tid salva is loose not as thick. Reports fatigue in the afternoon.  Set-up films were reviewed. The chart was checked.  Physical Findings  height is 5\' 4"  (1.626 m) and weight is 105 lb 12.8 oz (48 kg). His oral temperature is 98.3 F (36.8 C). His blood pressure is 136/89 and his pulse is 95. His respiration is 16 and oxygen saturation is 100%. . Weight essentially stable.  No significant changes. Patient was seen in a wheel chair.  Impression The patient is tolerating radiation.  Plan Continue treatment as planned.   Sheral Apley [name], M.D.  This document serves as a record of services personally performed by [name], MD. It was created on his behalf by Maryla Morrow, a trained medical scribe. The creation of this record is based on the scribe's personal observations and the provider's statements to them. This document has been checked and  approved by the attending provider.

## 2016-06-17 ENCOUNTER — Encounter: Payer: Self-pay | Admitting: Radiation Oncology

## 2016-06-17 ENCOUNTER — Ambulatory Visit: Payer: Medicare HMO

## 2016-06-17 ENCOUNTER — Ambulatory Visit: Payer: Medicare HMO | Admitting: Radiation Oncology

## 2016-06-17 DIAGNOSIS — C319 Malignant neoplasm of accessory sinus, unspecified: Secondary | ICD-10-CM | POA: Diagnosis present

## 2016-06-17 DIAGNOSIS — Z510 Encounter for antineoplastic radiation therapy: Secondary | ICD-10-CM | POA: Diagnosis not present

## 2016-06-17 DIAGNOSIS — C61 Malignant neoplasm of prostate: Secondary | ICD-10-CM | POA: Insufficient documentation

## 2016-06-20 ENCOUNTER — Ambulatory Visit: Payer: Medicare HMO

## 2016-06-20 DIAGNOSIS — Z510 Encounter for antineoplastic radiation therapy: Secondary | ICD-10-CM | POA: Diagnosis not present

## 2016-06-21 ENCOUNTER — Ambulatory Visit
Admission: RE | Admit: 2016-06-21 | Discharge: 2016-06-21 | Disposition: A | Discharge status: Home / Self Care | Payer: Medicare HMO | Attending: Radiation Oncology | Admitting: Radiation Oncology

## 2016-06-21 ENCOUNTER — Ambulatory Visit
Admission: RE | Admit: 2016-06-21 | Discharge: 2016-06-21 | Disposition: A | Discharge status: Home / Self Care | Payer: Medicare HMO | Source: Ambulatory Visit | Attending: Radiation Oncology | Admitting: Radiation Oncology

## 2016-06-21 ENCOUNTER — Ambulatory Visit: Payer: Medicare HMO | Admitting: Nutrition

## 2016-06-21 ENCOUNTER — Encounter: Payer: Self-pay | Admitting: *Deleted

## 2016-06-21 ENCOUNTER — Encounter: Payer: Self-pay | Admitting: Radiation Oncology

## 2016-06-21 VITALS — BP 119/92 | HR 102 | Temp 98.4°F | Resp 16 | Ht 64.0 in | Wt 102.4 lb

## 2016-06-21 DIAGNOSIS — M109 Gout, unspecified: Secondary | ICD-10-CM

## 2016-06-21 DIAGNOSIS — Z510 Encounter for antineoplastic radiation therapy: Secondary | ICD-10-CM | POA: Diagnosis not present

## 2016-06-21 DIAGNOSIS — C139 Malignant neoplasm of hypopharynx, unspecified: Secondary | ICD-10-CM

## 2016-06-21 MED ORDER — HYDROCODONE-ACETAMINOPHEN 7.5-325 MG/15ML PO SOLN: 10.0000 mL | ORAL | 0 refills | Status: DC | PRN

## 2016-06-21 MED ORDER — COLCHICINE 0.6 MG PO TABS: 0.6000 mg | ORAL_TABLET | Freq: Every day | ORAL | 0 refills | Status: DC

## 2016-06-21 MED ORDER — NYSTATIN 100000 UNIT/ML MT SUSP: 5.0000 mL | Freq: Four times a day (QID) | OROMUCOSAL | 0 refills | Status: DC

## 2016-06-21 MED FILL — NYSTATIN 100,000 UNITS/ML S: 100000 | 14 days supply | Qty: 280 | Fill #0

## 2016-06-21 MED FILL — HYDROCOD-APAP 7.5-325/15ML: 7.5-325 | 5 days supply | Qty: 473 | Fill #0

## 2016-06-21 NOTE — Progress Notes (Signed)
Final nutrition follow-up completed with patient on last daily radiation therapy for left pyriform sinus/laryngeal malignancy. Weight has decreased and was documented as 102 pounds down from 105.8 pounds October 11. Patient continues to tolerate soft moist foods and oral nutrition supplements. His fiance is very supportive.  Nutrition diagnosis: Severe malnutrition continues.  Intervention: Recommended patient consume four bottles of oral nutrition supplements daily in addition to meals and snacks. Provided samples of Ensure Plus. Encouraged patient to contact me with further questions or concerns or if he is unable to consume adequate calories and protein for repletion.  Monitoring, evaluation, goals: Patient will work to increase calories and protein to promote weight maintenance/weight gain.  Next visit: Will be scheduled as needed.  **Disclaimer: This note was dictated with voice recognition software. Similar sounding words can inadvertently be transcribed and this note may contain transcription errors which may not have been corrected upon publication of note.**

## 2016-06-21 NOTE — Progress Notes (Signed)
   Weekly Management Note  Outpatient    ICD-9-CM ICD-10-CM   1. Acute gouty arthropathy 274.01 M10.9 colchicine 0.6 MG tablet  2. 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 Total Dose: 66 Gy   Narrative:  The patient presents for routine under treatment assessment on last day of radiotherapy.  CBCT/MVCT images/Port film x-rays were reviewed.  The chart was checked. Has pain, especially at night, in throat.  Eating soft foods and liquids. Needs refills on Colchicine, sig other states he is overwhelmed and cannot stay up to stay w/ PCP refills.  Needs refill on Hycet.  Physical Findings:  height is 5\' 4"  (1.626 m) and weight is 102 lb 6.4 oz (46.4 kg). His oral temperature is 98.4 F (36.9 C). His blood pressure is 119/92 (abnormal) and his pulse is 102 (abnormal). His respiration is 16 and oxygen saturation is 100%.   Wt Readings from Last 3 Encounters:  06/21/16 102 lb 6.4 oz (46.4 kg)  06/16/16 105 lb 12.8 oz (48 kg)  06/15/16 105 lb 12.8 oz (48 kg)   Thrush in mouth. Hyperpigmented skin of neck.  CMP     Component Value Date/Time   NA 125 (L) 03/19/2016 0411   NA 125 (A) 03/19/2016   K 3.9 03/19/2016 0411   CL 91 (L) 03/19/2016 0411   CO2 25 03/19/2016 0411   GLUCOSE 97 03/19/2016 0411   BUN 14 03/19/2016 0411   BUN 14 03/19/2016   CREATININE 0.69 03/19/2016 0411   CREATININE 0.91 05/02/2011 1150   CALCIUM 8.1 (L) 03/19/2016 0411   PROT 5.5 (L) 03/18/2016 0441   ALBUMIN 2.6 (L) 03/18/2016 0441   AST 136 (H) 03/18/2016 0441   ALT 38 03/18/2016 0441   ALKPHOS 492 (H) 03/18/2016 0441   BILITOT 6.3 (H) 03/18/2016 0441   GFRNONAA >60 03/19/2016 0411   GFRAA >60 03/19/2016 0411    Impression:  The patient has tolerated radiotherapy.  Plan:  Routine follow-up in 1-2 weeks  Hycet refilled at q4hrs prn. Colchicine refilled Nystatin for  thrush Rx'd ________________________________   Eppie Gibson, M.D.

## 2016-06-21 NOTE — Addendum Note (Signed)
Encounter addended by: Malena Edman, RN on: 06/21/2016  4:50 PM<BR>    Actions taken: Patient Education assessment filed

## 2016-06-21 NOTE — Progress Notes (Signed)
Weight and vitals stable. Reports back of head hurts,back and throatpain 5/10. Reports pain is so severe at night he is unable to sleep. Reports he is sleeping about three hours during the night.  Reports  Appetite has decreased hard to eat because of the severe throat pain. Eating soft foods marsh potatoes,gravey other soft foods and Ensure and smoothies. Denies bowel or bladder complaints. Reports Carafate is adequately managing discomfort associated with swallowing. Hperpigmentation of anterior neck noted. Reports using sonafine as directed. Baking soda rinse manages saliva well tid salva is loose not as thick. Reports fatigue in the afternoon.  EOT instructions to continue same plan for cancer of hypopharynx. Wt Readings from Last 3 Encounters:  06/21/16 102 lb 6.4 oz (46.4 kg)  06/16/16 105 lb 12.8 oz (48 kg)  06/15/16 105 lb 12.8 oz (48 kg)  BP (!) 119/92 (BP Location: Right Arm, Patient Position: Sitting, Cuff Size: Normal)   Pulse (!) 102   Temp 98.4 F (36.9 C) (Oral)   Resp 16   Ht 5\' 4"  (1.626 m)   Wt 102 lb 6.4 oz (46.4 kg)   SpO2 100%   BMI 17.58 kg/m

## 2016-06-23 NOTE — Progress Notes (Signed)
Oncology Nurse Navigator Documentation  Met with Adam Marshall and his SO following final RT. I provided SO with a Certificate of Recognition for her supportive care. I provided post-RT guidance:  Importance of keeping follow-up appts with Nutrition and SLP.  Importance of protecting treatment area from sun.  Continuation of Sonafine application 2-3 times daily. I explained that my role as navigator will continue for several more months and that I will be calling and/or joining him during follow-up visits.   I encouraged him to call me with needs/concerns.   They verbalized understanding of information provided.  Gayleen Orem, RN, BSN, [facility] [phone]

## 2016-06-24 ENCOUNTER — Telehealth: Payer: Self-pay | Admitting: *Deleted

## 2016-06-24 DIAGNOSIS — C61 Malignant neoplasm of prostate: Secondary | ICD-10-CM | POA: Insufficient documentation

## 2016-06-24 NOTE — Progress Notes (Signed)
Rescheduled for 2018

## 2016-06-24 NOTE — Telephone Encounter (Signed)
Called patient to inform of gold seed placement on Nov. 6- arrival time - 2:30 pm  @ Dr. Simone Curia  Office and his sim on Nov. 9 @ 9 am, spoke with patient's significant other  Danna Hefty ) and she is aware of these appts.

## 2016-06-27 ENCOUNTER — Telehealth: Payer: Self-pay | Admitting: *Deleted

## 2016-06-27 NOTE — Telephone Encounter (Signed)
CALLED PATIENT TO GIVE INFO., LVM FOR A RETURN CALL 

## 2016-06-30 ENCOUNTER — Ambulatory Visit
Admission: RE | Admit: 2016-06-30 | Discharge: 2016-06-30 | Disposition: A | Discharge status: Home / Self Care | Payer: Medicare HMO | Source: Ambulatory Visit | Attending: Radiation Oncology | Admitting: Radiation Oncology

## 2016-06-30 DIAGNOSIS — C61 Malignant neoplasm of prostate: Secondary | ICD-10-CM

## 2016-07-05 ENCOUNTER — Other Ambulatory Visit: Payer: Self-pay | Admitting: Radiation Oncology

## 2016-07-06 ENCOUNTER — Ambulatory Visit
Admission: RE | Admit: 2016-07-06 | Discharge: 2016-07-06 | Disposition: A | Discharge status: Home / Self Care | Payer: Medicare HMO | Source: Ambulatory Visit | Attending: Radiation Oncology | Admitting: Radiation Oncology

## 2016-07-06 ENCOUNTER — Encounter: Payer: Self-pay | Admitting: *Deleted

## 2016-07-06 ENCOUNTER — Ambulatory Visit (INDEPENDENT_AMBULATORY_CARE_PROVIDER_SITE_OTHER): Payer: Medicare HMO | Admitting: Family

## 2016-07-06 ENCOUNTER — Encounter: Payer: Self-pay | Admitting: Radiation Oncology

## 2016-07-06 VITALS — BP 157/90 | HR 94 | Temp 98.2°F | Resp 18 | Ht 64.0 in | Wt 102.6 lb

## 2016-07-06 VITALS — BP 124/98 | HR 85 | Resp 16 | Ht 64.0 in | Wt 104.0 lb

## 2016-07-06 DIAGNOSIS — C12 Malignant neoplasm of pyriform sinus: Secondary | ICD-10-CM | POA: Diagnosis present

## 2016-07-06 DIAGNOSIS — G8929 Other chronic pain: Secondary | ICD-10-CM

## 2016-07-06 DIAGNOSIS — Z7951 Long term (current) use of inhaled steroids: Secondary | ICD-10-CM | POA: Diagnosis not present

## 2016-07-06 DIAGNOSIS — Z79899 Other long term (current) drug therapy: Secondary | ICD-10-CM | POA: Insufficient documentation

## 2016-07-06 DIAGNOSIS — M1A09X0 Idiopathic chronic gout, multiple sites, without tophus (tophi): Secondary | ICD-10-CM

## 2016-07-06 DIAGNOSIS — M25562 Pain in left knee: Secondary | ICD-10-CM

## 2016-07-06 DIAGNOSIS — C61 Malignant neoplasm of prostate: Secondary | ICD-10-CM | POA: Diagnosis not present

## 2016-07-06 DIAGNOSIS — Z880 Allergy status to penicillin: Secondary | ICD-10-CM | POA: Diagnosis not present

## 2016-07-06 DIAGNOSIS — R21 Rash and other nonspecific skin eruption: Secondary | ICD-10-CM | POA: Insufficient documentation

## 2016-07-06 DIAGNOSIS — C139 Malignant neoplasm of hypopharynx, unspecified: Secondary | ICD-10-CM

## 2016-07-06 DIAGNOSIS — Z7982 Long term (current) use of aspirin: Secondary | ICD-10-CM | POA: Insufficient documentation

## 2016-07-06 DIAGNOSIS — Z923 Personal history of irradiation: Secondary | ICD-10-CM | POA: Insufficient documentation

## 2016-07-06 MED ORDER — VALACYCLOVIR HCL 1 G PO TABS: 1000.0000 mg | ORAL_TABLET | Freq: Three times a day (TID) | ORAL | 0 refills | Status: AC

## 2016-07-06 NOTE — Assessment & Plan Note (Signed)
Gout with minimal flares appears adequately controlled with current regimen. Reeducated regarded proper usage of medications including allopurinol and colchicine. No adverse side effects of medication noted. Minimal flares. Continue current dosage of allopurinol daily and colchicine as needed. Continue to monitor.

## 2016-07-06 NOTE — Patient Instructions (Signed)
Thank you for choosing [facility].  SUMMARY AND INSTRUCTIONS:  Ice x 20 minutes every 2 hours as needed.  Take it easy on the knee for the next 24 hours.   Medication:  Please continue to take your medications as prescribed.   Your prescription(s) have been submitted to your pharmacy or been printed and provided for you. Please take as directed and contact our office if you believe you are having problem(s) with the medication(s) or have any questions.  Follow up:  If your symptoms worsen or fail to improve, please contact our office for further instruction, or in case of emergency go directly to the emergency room at the closest medical facility.

## 2016-07-06 NOTE — Progress Notes (Deleted)
Weight and vitals stable. Reports back of head hurts,back and throatpain 8/10. Reports he has a rash on the upper left shoulder area since yesterday that itch at times.  Reports Appetite has increased easier to eat still has a throat pain. Eating soft foods marsh potatoes,gravey other soft foodsand Ensure enlive and smoothies. Denies bowel or bladder complaints. Reports Carafate is adequately managing discomfort associated with swallowing. Hperpigmentation of anterior neck noted. Weight and vitals stable. Reports back of head hurts,back and throatpain 8/10. Reports he has a rash on the upper left shoulder area since yesterday that itch at times.  Reports Appetite has increased easier to eat still has a throat pain. Eating soft foods marsh potatoes,gravey other soft foodsand Ensure enlive and smoothies. Denies bowel or bladder complaints. Reports Carafate is adequately managing discomfort associated with swallowing. Hperpigmentation of anterior neck noted.      07/06/16 102 lb 9.6 oz (46.5 kg)  07/06/16 104 lb (47.2 kg)  06/21/16 102 lb 6.4 oz (46.4 kg)   BP (!) 157/90   Pulse 94   Temp 98.2 F (36.8 C) (Oral)   Resp 18   Ht 5\' 4"  (1.626 m)   Wt 102 lb 9.6 oz (46.5 kg)   SpO2 100%   BMI 17.61 kg/m     Reports using sonafine as directed.  Asked to use lotion with vitamin E.  Baking soda rinse manages saliva well tid salva is loose not as thick. Reports fatigue has decreased feeling better.  Needs refill on his medications today Hycet for pain         Wt Readings from Last 3 Encounters:  07/06/16 102 lb 9.6 oz (46.5 kg)  07/06/16 104 lb (47.2 kg) Weight and vitals stable. Reports back of head hurts,back and throatpain 8/10. Reports he has a rash on the upper left shoulder area since yesterday that itch at times.  Reports Appetite has increased easier to eat still has a throat pain. Eating soft foods marsh potatoes,gravey other soft foodsand Ensure enlive and  smoothies. Denies bowel or bladder complaints. Reports Carafate is adequately managing discomfort associated with swallowing. Hperpigmentation of anterior neck noted.    Wt Readings from Last 3 Encounters:  07/06/16 102 lb 9.6 oz (46.5 kg)  07/06/16 104 lb (47.2 kg)  06/21/16 102 lb 6.4 oz (46.4 kg)   BP (!) 157/90   Pulse 94   Temp 98.2 F (36.8 C) (Oral)   Resp 18   Ht 5\' 4"  (1.626 m)   Wt 102 lb 9.6 oz (46.5 kg)   SpO2 100%   BMI 17.61 kg/m     Reports using sonafine as directed.  Asked to use lotion with vitamin E.  Baking soda rinse manages saliva well tid salva is loose not as thick. Reports fatigue has decreased feeling better.  Needs refill on his medications today Hycet for pain       06/21/16 102 lb 6.4 oz (46.4 kg)   BP (!) 157/90   Pulse 94   Temp 98.2 F (36.8 C) (Oral)   Resp 18   Ht 5\' 4"  (1.626 m)   Wt 102 lb 9.6 oz (46.5 kg)   SpO2 100%   BMI 17.61 kg/m     Reports using sonafine as directed.  Asked to use lotion with vitamin E.  Baking soda rinse manages saliva well tid salva is loose not as thick. Reports fatigue has decreased feeling better.  Needs refill on his medications today Hycet for pain

## 2016-07-06 NOTE — Progress Notes (Signed)
Subjective:    Patient ID: Adam Marshall, male    DOB: 04-Sep-1961, 55 y.o.   MRN: [id number]  Chief Complaint  Patient presents with  . Medication Refill    needs refills, also has a rash on chest that goes down back that he would like checked    HPI:  Adam Marshall is a 55 y.o. male who  has a past medical history of Abnormal LFTs; Cancer ([place]); Chronic back pain; Chronic knee pain; Cirrhosis ([place]); Fatty liver; Gout; Hiatal hernia; Hypertension; Pancreatic lesion; Pancreatitis; and Prostate cancer ([place]). and presents today for an office visit.   1.) Gout / left knee pain - Currently maintained on allopurinol and colchicine. Reports taking the meidcation as prescribed and denies adverse side effects. Notes that gout is adequately controlled with the current regiment. Previous with concern for gouty arthritis and injected with cortisone which did help with the pain. Pains have gradually returned and described as sharp on occasion. Requesting an additional cortisone injection today.   Allergies  Allergen Reactions  . Penicillins Hives and Rash    Has patient had a PCN reaction causing immediate rash, facial/tongue/throat swelling, SOB or lightheadedness with hypotension:Yes Has patient had a PCN reaction causing severe rash involving mucus membranes or skin necrosis:unsure Has patient had a PCN reaction that required hospitalization:No Has patient had a PCN reaction occurring within the last 10 years:No  If all of the above answers are "NO", then may proceed with Cephalosporin use.       Review of Systems  Constitutional: Negative for chills and fever.  Respiratory: Negative for chest tightness and shortness of breath.   Musculoskeletal:       Positive for left knee pain.      Objective:    BP (!) 124/98 (BP Location: Left Arm, Patient Position: Sitting, Cuff Size: Normal)   Pulse 85   Resp 16   Ht 5\' 4"  (1.626 m)   Wt 104 lb (47.2 kg)   SpO2 91%   BMI 17.85  kg/m  Nursing note and vital signs reviewed.  Physical Exam  Constitutional: He is oriented to person, place, and time. He appears cachectic. No distress.  Cardiovascular: Normal rate, regular rhythm, normal heart sounds and intact distal pulses.   Pulmonary/Chest: Effort normal and breath sounds normal.  Musculoskeletal:  Left knee - No obvious deformity, discoloration, or edema. There is generalized anterior tenderness with no crepitus or deformity. Range of motion is within normal limits and strength is normal. Ligamentous and meniscal testing are negative. Distal pulses and sensation are intact and appropriate.   Neurological: He is alert and oriented to person, place, and time.  Skin: Skin is warm and dry.  Psychiatric: He has a normal mood and affect. His behavior is normal. Judgment and thought content normal.   Procedure: Corticosteroid injection of the left knee   Description: Informed consent was obtained with discussion including risks and benefits of the procedure. Patient verbally wished to continued. Time out was performed. The left knee was marked and identified for the anterior-lateral approach.. It was cleansed with betadine using a concentric circular pattern. Skin anesthesia was applied using cold spray applied for 10 seconds. Injection of 1 ml :4 ml of Kenalog (40 mg/ml)  to Sensoricaine was injected following aspiration with no return noted. Following the injection there was instant relief confirming appropriate placement. A bandage was applied to the area and post care instructions were provided. The procedure was tolerated well with no complications.  Assessment & Plan:   Problem List Items Addressed This Visit      Other   Left knee pain - Primary    Continues to experience left knee pain likely multifactorial as previously noted including arthritis and unlikely gout as appears well controlled with current dosage of allopurinol. Injection provided today.  Encouraged to continue ice and knee sleeve as needed for discomfort. Continue to monitor.      Gout    Gout with minimal flares appears adequately controlled with current regimen. Reeducated regarded proper usage of medications including allopurinol and colchicine. No adverse side effects of medication noted. Minimal flares. Continue current dosage of allopurinol daily and colchicine as needed. Continue to monitor.        Other Visit Diagnoses   None.      I am having Adam Marshall maintain his fluticasone, aspirin, folic acid, Lifitegrast, albuterol, feeding supplement (ENSURE ENLIVE), guaiFENesin, sodium chloride, RA VITAMIN B-1, mupirocin cream, propranolol, pantoprazole, lactulose, sucralfate, oxyCODONE, cyclobenzaprine, colchicine, HYDROcodone-acetaminophen, and nystatin.   Follow-up: Return in about 4 months (around 11/03/2016), or if symptoms worsen or fail to improve.  [name], FNP

## 2016-07-06 NOTE — Progress Notes (Deleted)
Weight and vitals stable. Reports back of head hurts,back and throatpain 8/10. Reports he has a rash on the upper left shoulder area since yesterday that itch at times.  Reports Appetite has increased easier to eat still has a throat pain. Eating soft foods marsh potatoes,gravey other soft foodsand Ensure enlive and smoothies. Denies bowel or bladder complaints. Reports Carafate is adequately managing discomfort associated with swallowing. Hperpigmentation of anterior neck noted.                      Wt Readings from Last 3 Encounters:  07/06/16 102 lb 9.6 oz (46.5 kg)  07/06/16 104 lb (47.2 kg)  06/21/16 102 lb 6.4 oz (46.4 kg)   BP (!) 157/90   Pulse 94   Temp 98.2 F (36.8 C) (Oral)   Resp 18   Ht 5\' 4"  (1.626 m)   Wt 102 lb 9.6 oz (46.5 kg)   SpO2 100%   BMI 17.61 kg/m     Reports using sonafine as directed.  Asked to use lotion with vitamin E.  Baking soda rinse manages saliva well tid salva is loose not as thick. Reports fatigue has decreased feeling better.  Needs refill on his medications today Hycet for pain

## 2016-07-06 NOTE — Progress Notes (Addendum)
Progress Notes Date of Service: 06/21/2016 3:33 PM Malena Edman, RN     Weight and vitals stable. Reports back of head hurts,back and throatpain 8/10. Reports he has a rash on the upper left shoulder area since yesterday that itch at times.  Reports  Appetite has increased  easier to eat still has a throat pain. Eating soft foods marsh potatoes,gravey other soft foodsand Ensure enlive and smoothies. Denies bowel or bladder complaints. Reports Carafate is adequately managing discomfort associated with swallowing. Hperpigmentation of anterior neck noted. Wt Readings from Last 3 Encounters:  07/06/16 102 lb 9.6 oz (46.5 kg)  07/06/16 104 lb (47.2 kg)  06/21/16 102 lb 6.4 oz (46.4 kg)   BP (!) 157/90   Pulse 94   Temp 98.2 F (36.8 C) (Oral)   Resp 18   Ht 5\' 4"  (1.626 m)   Wt 102 lb 9.6 oz (46.5 kg)   SpO2 100%   BMI 17.61 kg/m     Reports using sonafine as directed.  Asked to use lotion with vitamin E.  Baking soda rinse manages saliva well tid salva is loose not as thick. Reports fatigue has decreased feeling better.  Needs refill on his medications today Hycet for pain

## 2016-07-06 NOTE — Assessment & Plan Note (Addendum)
Continues to experience left knee pain likely multifactorial as previously noted including arthritis and unlikely gout as appears well controlled with current dosage of allopurinol. Injection provided today. Encouraged to continue ice and knee sleeve as needed for discomfort. Continue to monitor.

## 2016-07-07 ENCOUNTER — Other Ambulatory Visit: Payer: Self-pay | Admitting: Radiation Oncology

## 2016-07-07 ENCOUNTER — Telehealth: Payer: Self-pay | Admitting: Radiation Oncology

## 2016-07-07 DIAGNOSIS — C139 Malignant neoplasm of hypopharynx, unspecified: Secondary | ICD-10-CM

## 2016-07-07 MED ORDER — HYDROCODONE-ACETAMINOPHEN 7.5-325 MG/15ML PO SOLN: 10.0000 mL | ORAL | 0 refills | Status: DC | PRN

## 2016-07-07 MED FILL — HYDROCOD-APAP 7.5-325/15ML: 7.5-325 | 5 days supply | Qty: 473 | Fill #0

## 2016-07-07 NOTE — Telephone Encounter (Signed)
Phoned [name] back. Explained Hycet script is ready for pick up in the radiation nursing area. Also, advised her to avoid applying any lotion within previous treatment field UNTIL shingles have resolved. Also, explained that per Peter Congo at [facility] [place] and [facility] is ready for pick up. She verbalized understanding of all reviewed and expressed appreciation for the call.

## 2016-07-07 NOTE — Progress Notes (Signed)
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 He completed radiotherapy to the hypopharynx without significant toxicity during treatment. He has elected to postpone his radiotherapy to the prostate until January 2018. He comes today for an unscheduled work in visit due to a rash on his back and shoulders.                           On review of systems, the patient states that in the last 24-48 hours he has developed a rash in the left upper chest wall and shoulder. He denies exposure to new soaps, chemicals, or new unwashed materials. He denies any burning of the site but has some mild soreness.He denies any weeping or draining from these sites, and cannot recall if he had chicken pox as a child.  ALLERGIES:  is allergic to penicillins.  Meds: Current Outpatient Prescriptions  Medication Sig Dispense Refill  . albuterol (PROVENTIL) (2.5 MG/3ML) 0.083% nebulizer solution Take 3 mLs (2.5 mg total) by nebulization every 6 (six) hours as needed for wheezing or shortness of breath. 75 mL 12  . aspirin 81 MG chewable tablet Chew 81 mg by mouth daily.    . colchicine 0.6 MG tablet Take 1 tablet (0.6 mg total) by mouth daily. 30 tablet 0  . cyclobenzaprine (FLEXERIL) 5 MG tablet Take 1 tablet (5 mg total) by mouth 3 (three) times daily as needed for muscle spasms. 60 tablet 1  . feeding supplement, ENSURE ENLIVE, (ENSURE ENLIVE) LIQD Take 237 mLs by mouth 3 (three) times daily between  meals. 237 mL 12  . fluticasone (FLONASE) 50 MCG/ACT nasal spray Place 1 spray into the nose daily.    . folic acid (FOLVITE) 1 MG tablet Take 1 tablet (1 mg total) by mouth daily. 90 tablet 1  . HYDROcodone-acetaminophen (HYCET) 7.5-325 mg/15 ml solution Take 10-15 mLs by mouth every 4 (four) hours as needed for moderate pain. 473 mL 0  . lactulose (CEPHULAC) 10 g packet Take 1 packet (10 g total) by mouth daily. 30 each 6  . Lifitegrast (XIIDRA) 5 % SOLN Apply 1 drop to eye 2 (two) times daily.    Marland Kitchen nystatin (MYCOSTATIN) 100000 UNIT/ML suspension Take 5 mLs (500,000 Units total) by mouth 4 (four) times daily. Swish in mouth for a minute, then swallow. 280 mL 0  . oxyCODONE (OXYCONTIN) 20 mg 12 hr tablet Take 1 tablet (20 mg total) by mouth every 12 (twelve) hours. 60 tablet 0  . propranolol (INDERAL) 10 MG tablet TAKE 1 TABLET(10 MG) BY MOUTH TWICE DAILY 180 tablet 0  . RA VITAMIN B-1 100 MG TABS Take 1 tablet by mouth daily.  0  . sodium chloride (OCEAN) 0.65 % SOLN nasal spray Place 2 sprays into both nostrils 2 (two) times daily. 1 Bottle 0  . sucralfate (CARAFATE) 1 g tablet Take 1 tablet (1 g total) by mouth 4 (four)  times daily -  with meals and at bedtime. Crush and mix with 3-4 tablespoons of water and take 10 minutes prior to eating 120 tablet 1  . allopurinol (ZYLOPRIM) 100 MG tablet TAKE 1 TABLET BY MOUTH ONCE DAILY 30 tablet 0  . guaiFENesin (MUCINEX) 600 MG 12 hr tablet Take 1 tablet (600 mg total) by mouth 2 (two) times daily. (Patient not taking: Reported on 07/06/2016) 30 tablet 0  . mupirocin cream (BACTROBAN) 2 % Apply 1 application topically 2 (two) times daily. (Patient not taking: Reported on 07/06/2016) 15 g 0  . pantoprazole (PROTONIX) 40 MG tablet Take 1 tablet (40 mg total) by mouth daily. 30 tablet 2  . valACYclovir (VALTREX) 1000 MG tablet Take 1 tablet (1,000 mg total) by mouth 3 (three) times daily. 21 tablet 0   No current facility-administered medications for this  encounter.     Physical Findings:  height is 5\' 4"  (1.626 m) and weight is 102 lb 9.6 oz (46.5 kg). His oral temperature is 98.2 F (36.8 C). His blood pressure is 157/90 (abnormal) and his pulse is 94. His respiration is 18 and oxygen saturation is 100%.  In general this is a thin, chronically ill appearing African American male in no acute distress. He's alert and oriented x4 and appropriate throughout the examination. Cardiopulmonary assessment is negative for acute distress and he exhibits normal effort.  His rash is pictured below and has multiple clusters of vessicles, non of which appear to be weeping or draining.    Lab Findings: Lab Results  Component Value Date   WBC 9.5 03/19/2016   HGB 10.5 (L) 03/19/2016   HCT 32.5 (L) 03/19/2016   MCV 95.3 03/19/2016   PLT 127 (L) 03/19/2016     Radiographic Findings: No results found.  Impression/Plan: 77. 55 yo man with likely T1 N0 M0, pyriform sinus squamous cell carcinoma. The patient appears to be doing well since radiotherapy. He has been able to increase po intake and has maintained his weight since treatment. He continues to use carafate and finds this helpful. He will let us know if he needs any additional refills of this and see me back in about 2 weeks.   2. Stage T1c N0 M0 adenocarcinoma of the prostate with a Gleason's Score of 3+4 and a PSA of 7.61. The patient has been counseled on his disease, and has been encouraged to pursue radiation to the prostate as he is not a surgical candidate, he is also less likely a candidate for brachytherapy, but will move forward with simulation and radiation treatment beginning in January 2018. 3. Probable Herpes Zoster. I spoke with the patient about my concerns for "shingles" and we reviewed proper skin care and precautions to avoid exposure to others. A new prescription is provided for Valacyclovir 1 gram po TID for 7 days. He will keep [place] informed if his symptoms worsen or do not improve.      Carola Rhine, PAC

## 2016-07-07 NOTE — Telephone Encounter (Signed)
Opened in error. See other progress note from today.

## 2016-07-12 ENCOUNTER — Telehealth: Payer: Self-pay | Admitting: Radiation Oncology

## 2016-07-12 NOTE — Telephone Encounter (Signed)
Phoned patient back. Patient states, "my shingles is drying up, I have one more pill left, can I put vaseline on now?" Explained that he should NOT put vaseline or any other products on areas with shingles for at least the next 14 days but he can moisturize the old treatment field. Patient verbalized understanding and expressed appreciation for the return call.

## 2016-07-14 ENCOUNTER — Ambulatory Visit: Payer: Medicare HMO | Admitting: Radiation Oncology

## 2016-07-22 ENCOUNTER — Encounter: Payer: Self-pay | Admitting: Radiation Oncology

## 2016-07-27 ENCOUNTER — Telehealth: Payer: Self-pay | Admitting: Radiation Oncology

## 2016-07-27 ENCOUNTER — Telehealth: Payer: Self-pay | Admitting: *Deleted

## 2016-07-27 NOTE — Telephone Encounter (Signed)
Oncology Nurse Navigator Documentation  Called Adam Marshall to check on availability next Tuesday morning for follow-up with SLP and Nutrition.  He confirmed available, I provide a 0900 arrival time to Radiation Waiting, explained registration process.  Gayleen Orem, RN, BSN, [place] at [place] [phone]

## 2016-07-27 NOTE — Telephone Encounter (Signed)
Returned patient's call. Patient questions what cream he should use since his shingles have "dried up." Patient confirms he has sonafine cream left thus, encouraged him to use this twice a day until gone. Patient verbalized understanding and expressed appreciation for the return call.

## 2016-07-30 NOTE — Addendum Note (Signed)
Encounter addended by: [name], MD on: 07/30/2016  4:02 PM<BR>    Actions taken: Problem List reviewed, Sign clinical note

## 2016-08-01 ENCOUNTER — Telehealth: Payer: Self-pay | Admitting: *Deleted

## 2016-08-01 NOTE — Telephone Encounter (Signed)
Opened in error

## 2016-08-01 NOTE — Telephone Encounter (Signed)
Oncology Nurse Navigator Documentation  LVM reminding Mr. Dun of 0900 arrival to Radiation Waiting for tomorrow's H&N [place] following registration.  Gayleen Orem, RN, BSN, [place] at [place] [phone]

## 2016-08-02 ENCOUNTER — Encounter: Payer: Self-pay | Admitting: *Deleted

## 2016-08-02 ENCOUNTER — Ambulatory Visit: Payer: Medicare HMO | Admitting: Nutrition

## 2016-08-02 ENCOUNTER — Ambulatory Visit
Admission: RE | Admit: 2016-08-02 | Discharge: 2016-08-02 | Disposition: A | Discharge status: Home / Self Care | Payer: Medicare HMO | Source: Ambulatory Visit | Attending: Radiation Oncology | Admitting: Radiation Oncology

## 2016-08-02 ENCOUNTER — Ambulatory Visit: Payer: Medicare HMO | Attending: Radiation Oncology

## 2016-08-02 VITALS — BP 141/93 | HR 99 | Temp 97.5°F | Wt 103.0 lb

## 2016-08-02 DIAGNOSIS — R1310 Dysphagia, unspecified: Secondary | ICD-10-CM

## 2016-08-02 DIAGNOSIS — C12 Malignant neoplasm of pyriform sinus: Secondary | ICD-10-CM

## 2016-08-02 NOTE — Therapy (Signed)
[facility] [address] [place], [place], [address] Phone: [phone]   Fax:  [phone]  Speech Language Pathology Treatment  Patient Details  Name: Adam Marshall MRN: [id number] Date of Birth: July 10, 1961 Referring Provider: [name]  Encounter Date: 08/02/2016      End of Session - 08/02/16 1235    Visit Number 2   Number of Visits 5   Date for SLP Re-Evaluation 10/28/16   SLP Start Time 0830   SLP Stop Time  0915   SLP Time Calculation (min) 45 min   Activity Tolerance Patient tolerated treatment well      Past Medical History:  Diagnosis Date  . Abnormal LFTs   . Cancer ([place])    invasive squamous cell carcinoma of pyriform sinus  . Chronic back pain   . Chronic knee pain    left  . Cirrhosis ([place])   . Fatty liver   . Gout   . Hiatal hernia   . Hypertension   . Pancreatic lesion   . Pancreatitis   . Prostate cancer [facility])     Past Surgical History:  Procedure Laterality Date  . ESOPHAGOGASTRODUODENOSCOPY N/A 03/15/2016   Procedure: ESOPHAGOGASTRODUODENOSCOPY (EGD);  Surgeon: Doran Stabler, MD;  Location: [facility];  Service: Endoscopy;  Laterality: N/A;  . KNEE SURGERY     left  . PANENDOSCOPY N/A 03/18/2016   Procedure: DIRECT LARYNGOSCOPY, WITH BRONCHOSCOPY, AND ESOPHAGOSCOPY, WITH BIOPSY;  Surgeon: [name], MD;  Location: [facility]S;  Service: ENT;  Laterality: N/A;  . PROSTATE BIOPSY      There were no vitals filed for this visit.      Subjective Assessment - 08/02/16 0933    Subjective "I got so I get up and throw the pillow off the bed and put my head up and look at my feet."   Currently in Pain? No/denies               ADULT SLP TREATMENT - 08/02/16 0950      General Information   Behavior/Cognition Alert;Cooperative;Pleasant mood     Treatment Provided   Treatment provided Dysphagia     Dysphagia Treatment   Temperature Spikes Noted No   Respiratory Status Room air   Oral Cavity - Dentition Missing dentition   Treatment Methods Skilled observation;Therapeutic exercise;Patient/caregiver education   Patient observed directly with PO's Yes   Type of PO's observed Dysphagia 3 (soft);Thin liquids   Liquids provided via Cup   Oral Phase Signs & Symptoms --  no overt s/s noted   Pharyngeal Phase Signs & Symptoms --  no overt s/s noted   Type of cueing Verbal;Visual   Amount of cueing --  usually   Other treatment/comments Ptwas re-educated re: late effects head/neck radiation (muscle fibrosis) due to telling SLP he completed HEP approx 3 times per week. SLP req'd to provide verbal and visual cues usually in order for pt to complete HEP correctly. Pt ate cheese and drank H20 without overt s/s aspiration. SLP educated pt re: overt s/s aspiration PNA and provided handout.      Assessment / Recommendations / Plan   Plan Continue with current plan of care     Progression Toward Goals   Progression toward goals Progressing toward goals          SLP Education - 08/02/16 1234    Education provided Yes   Education Details late effects head/neck radiation on swallowing   Person(s) Educated Patient  Methods Explanation   Comprehension Verbalized understanding          SLP Short Term Goals - 08/02/16 1238      SLP SHORT TERM GOAL #1   Title pt will tell SLP why he is completing HEP with rare min A   Time 1   Period --  therapy visit   Status On-going  not met 08-02-16     SLP SHORT TERM GOAL #2   Title pt will complete dysphagia HEP with rare min A   Time 1   Period --  therapy visit   Status On-going  not met 08-02-16          SLP Long Term Goals - 08/02/16 1238      SLP LONG TERM GOAL #1   Title pt will complete HEP with independence   Time 3   Period --  visits, for all LTGs   Status On-going     SLP LONG TERM GOAL #2   Title pt will tell SLP how a food journal can assist return to full and  regular PO diet   Time 3   Period --  visits   Status On-going          Plan - 08/02/16 1235    Clinical Impression Statement Pt presents with dysgeusia as a s/e from head/neck radiation ending 06-21-16. His oropharyngeal swallow appears WNL at this time, although pt complains of difficulties with bread and "hard meat". Pt would cont to benefit from cont'd skilled ST to assess proper completion of HEP and to assess safety with current PO.    Speech Therapy Frequency --  approx every four weeks   Duration --  3 more visits   Treatment/Interventions Aspiration precaution training;Pharyngeal strengthening exercises;Diet toleration management by SLP;Compensatory techniques;SLP instruction and feedback;Trials of upgraded texture/liquids;Compensatory strategies;Patient/family education   Potential to Achieve Goals Good      Patient will benefit from skilled therapeutic intervention in order to improve the following deficits and impairments:   Dysphagia, unspecified type - Plan: SLP plan of care cert/re-cert    Problem List Patient Active Problem List   Diagnosis Date Noted  . Malignant neoplasm of prostate ([place]) 06/24/2016  . 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 Weakness 03/26/2016  . Gout 03/26/2016  . 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 HCAP (healthcare-associated pneumonia) 03/23/2016  . Hypokalemia 03/23/2016  . Hypomagnesemia 03/23/2016  . Protein-calorie malnutrition, severe ([place]) 03/23/2016  . Reflux esophagitis   . Pancreatic lesion   . Normochromic normocytic anemia   . Pyriform sinus mass   . Ascites   . Cirrhosis of liver with ascites ([place]) 03/14/2016  . Anemia due to chronic blood loss 03/14/2016  . Respiratory failure with hypoxia ([place]) 03/14/2016  . Lesion of pancreas   . Generalized abdominal pain   . Hyponatremia   . Abscess of skin of abdomen 01/13/2016  . Leg cramping 01/13/2016  . Abnormal LFTs (liver function tests)  10/13/2015  . Alcohol use 10/13/2015  . Left knee pain 02/04/2015  . Encounter for general adult medical examination with abnormal findings 12/25/2014  . Medicare annual wellness visit, subsequent 12/25/2014  . Knee pain 12/08/2014  . Chronic pancreatitis ([place]) 05/03/2011  . Rhinorrhea 04/24/2011  . PREMATURE VENTRICULAR CONTRACTIONS 07/07/2010  . HYPERKALEMIA 06/29/2010  . ACUTE GOUTY ARTHROPATHY 06/28/2010  . HYPERTENSION, BENIGN ESSENTIAL 06/28/2010    Imaan Padgett ,MS, CCC-SLP  08/02/2016, 12:40 PM  [facility] [address]  [place], [place], [address] Phone: [phone]   Fax:  [phone]   Name: Adam Marshall MRN: [id number] Date of Birth: 12/02/1960

## 2016-08-02 NOTE — Progress Notes (Signed)
Nutrition follow-up completed, during Head and Neck clinic.  Nutrition follow-up completed with patient status post treatment for left pyriform sinus/laryngeal malignancy. Weight is stable and documented as 103 pounds stable from 102 pounds in October. Patient reports he is eating well and eats often throughout the day. He has run out of oral nutrition supplements and cannot afford to purchase more. Denies nutrition impact symptoms.  Nutrition diagnosis: Severe malnutrition continues.  Intervention: Educated patient to begin oral nutrition supplements.  I provided complementary samples of Ensure Plus. Encouraged high-calorie, high-protein foods. Answered questions.  Teach back method was used.  Monitoring, evaluation, goals:  Patient will continue to increase calories and protein to promote weight gain.  Next visit: To be scheduled as needed.  **Disclaimer: This note was dictated with voice recognition software. Similar sounding words can inadvertently be transcribed and this note may contain transcription errors which may not have been corrected upon publication of note.**

## 2016-08-02 NOTE — Patient Instructions (Signed)
Do those exercises at least 5 days per week to keep the muscles from drawing up and getting hard!

## 2016-08-04 NOTE — Progress Notes (Addendum)
  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 The neck was treated to 44 Gy in 22 fractions at 2 Gy per fraction. 2. The neck was boosted to 22 Gy in 11 fractions at 2 Gy per fraction.   Beams/energy:    1. IMRT // 6X 2. IMRT // 6X  Narrative: The patient tolerated radiation treatment relatively well.   He developed throat pain, thrush, and hyperpigmented skin of the neck.   Plan: The patient has completed radiation treatment. The patient will return to radiation oncology clinic for routine followup in one month. Nystatin prescription written to treat thrush. Colchicine and Hycet refilled. I advised him to call or return sooner if he has any questions or concerns related to his recovery or treatment. ________________________________  Sheral Apley. [name], M.D.   This document serves as a record of services personally performed by [name], MD. It was created on his behalf by Arlyce Harman, a trained medical scribe. The creation of this record is based on the scribe's personal observations and the provider's statements to them. This document has been checked and approved by the attending provider.

## 2016-08-08 NOTE — Progress Notes (Signed)
Oncology Nurse Navigator Documentation  Met with Mr. Lowrey during H&N [place].  He was unaccompanied.   Arrived him to Nursing, provided verbal and written overview of [place], the clinicians who will be seeing him, encouraged him to ask questions during his time with them.  He was seen by Nutrition, SLP, and SW.  Spoke with him at end of [facility], addressed questions. He understands I can be contacted with needs/concerns.  Gayleen Orem, RN, BSN, [place] at [place] [phone]

## 2016-08-10 ENCOUNTER — Other Ambulatory Visit: Payer: Self-pay | Admitting: Radiation Oncology

## 2016-08-10 ENCOUNTER — Other Ambulatory Visit: Payer: Self-pay | Admitting: Gastroenterology

## 2016-08-11 ENCOUNTER — Other Ambulatory Visit: Payer: Self-pay

## 2016-08-11 MED ORDER — PANTOPRAZOLE SODIUM 40 MG PO TBEC: 40.0000 mg | DELAYED_RELEASE_TABLET | Freq: Every day | ORAL | 2 refills | Status: DC

## 2016-08-11 NOTE — Telephone Encounter (Signed)
His protonix can be refilled at current dose.  His furosemide was discontinued at the August office visit.  Is he retaining fluid again?

## 2016-08-11 NOTE — Telephone Encounter (Signed)
Refill request on Protonix 40 mg a day. Last seen 04-2016

## 2016-08-11 NOTE — Telephone Encounter (Signed)
Pt contacted. He states he is doing well. He denies any current fluid retention. Protonix refilled as directed.

## 2016-08-23 ENCOUNTER — Other Ambulatory Visit: Payer: Self-pay | Admitting: Gastroenterology

## 2016-09-08 ENCOUNTER — Telehealth: Payer: Self-pay | Admitting: *Deleted

## 2016-09-08 NOTE — Telephone Encounter (Signed)
CALLED PATIENT TO INFORM OF GOLD SEEDS TO BE PLACED @ DR. OTTELIN'S OFFICE ON 09-20-16 @ 10:30 AM AND HIS SIM ON 09-23-16 @ 3 PM @ DR. MANNING'S OFFICE, SPOKE WITH PATIENT AND HE IS AWARE OF THESE APPTS.

## 2016-09-09 ENCOUNTER — Ambulatory Visit: Payer: Medicare HMO | Admitting: Radiation Oncology

## 2016-09-13 ENCOUNTER — Other Ambulatory Visit: Payer: Self-pay | Admitting: Radiation Oncology

## 2016-09-16 ENCOUNTER — Telehealth: Payer: Self-pay

## 2016-09-16 NOTE — Telephone Encounter (Signed)
Pt called and left message rq rf of BP medication.  LOV in November 2017. Last prescriber was [place].   Okay to fill?

## 2016-09-19 NOTE — Telephone Encounter (Signed)
Spoke to pt and he is rq rf for allopurinol.   To [facility] on [address].

## 2016-09-19 NOTE — Telephone Encounter (Signed)
He does not have any current BP meds that we have ever prescribed.

## 2016-09-20 MED ORDER — ALLOPURINOL 100 MG PO TABS: 100.0000 mg | ORAL_TABLET | Freq: Every day | ORAL | 2 refills | Status: AC

## 2016-09-20 NOTE — Addendum Note (Signed)
Addended by: [name] D on: 09/20/2016 12:57 PM   Modules accepted: Orders

## 2016-09-20 NOTE — Telephone Encounter (Signed)
Please advise 

## 2016-09-20 NOTE — Telephone Encounter (Signed)
Medication sent.

## 2016-09-20 NOTE — Telephone Encounter (Signed)
Patient states pharmacy stated they did not have medication.

## 2016-09-23 ENCOUNTER — Ambulatory Visit
Admission: RE | Admit: 2016-09-23 | Discharge: 2016-09-23 | Disposition: A | Discharge status: Home / Self Care | Payer: Medicare HMO | Source: Ambulatory Visit | Attending: Radiation Oncology | Admitting: Radiation Oncology

## 2016-09-23 DIAGNOSIS — C61 Malignant neoplasm of prostate: Secondary | ICD-10-CM

## 2016-09-23 DIAGNOSIS — C12 Malignant neoplasm of pyriform sinus: Secondary | ICD-10-CM | POA: Diagnosis not present

## 2016-09-23 NOTE — Progress Notes (Signed)
  Radiation Oncology         [phone]) [phone] ________________________________  Name: Adam Marshall MRN: [id number]  Date: 09/23/2016  DOB: 02-Feb-1961  SIMULATION AND TREATMENT PLANNING NOTE    ICD-9-CM ICD-10-CM   1. Malignant neoplasm of prostate ([place]) 185 C61     DIAGNOSIS:  56 yo man with stage T1c N0 M0 adenocarcinoma of the prostate with a Gleason's Score of 3+4 and a PSA of 7.61  NARRATIVE:  The patient was brought to the [facility].  Identity was confirmed.  All relevant records and images related to the planned course of therapy were reviewed.  The patient freely provided informed written consent to proceed with treatment after reviewing the details related to the planned course of therapy. The consent form was witnessed and verified by the simulation staff.  Then, the patient was set-up in a stable reproducible supine position for radiation therapy.  A vacuum lock pillow device was custom fabricated to position his legs in a reproducible immobilized position.  Then, I performed a urethrogram under sterile conditions to identify the prostatic apex.  CT images were obtained.  Surface markings were placed.  The CT images were loaded into the planning software.  Then the prostate target and avoidance structures including the rectum, bladder, bowel and hips were contoured.  Treatment planning then occurred.  The radiation prescription was entered and confirmed.  A total of one complex treatment devices were fabricated. I have requested : Intensity Modulated Radiotherapy (IMRT) is medically necessary for this case for the following reason:  Rectal sparing.Marland Kitchen  PLAN:  The patient will receive 78 Gy in 40 fractions.  ________________________________  Sheral Apley [name], M.D.

## 2016-09-27 ENCOUNTER — Telehealth: Payer: Self-pay | Admitting: Radiation Oncology

## 2016-09-27 NOTE — Telephone Encounter (Signed)
Phoned patient at home. Patient, wife and I updated his medication list to match what he is currently taking. Educated patient about indications of use for each medication. Patient and wife verbalized understanding of all discuss. Medication record now reflect current medications.

## 2016-09-29 DIAGNOSIS — C12 Malignant neoplasm of pyriform sinus: Secondary | ICD-10-CM | POA: Diagnosis not present

## 2016-10-03 ENCOUNTER — Encounter: Payer: Self-pay | Admitting: Medical Oncology

## 2016-10-03 ENCOUNTER — Ambulatory Visit
Admission: RE | Admit: 2016-10-03 | Discharge: 2016-10-03 | Disposition: A | Discharge status: Home / Self Care | Payer: Medicare HMO | Source: Ambulatory Visit | Attending: Radiation Oncology | Admitting: Radiation Oncology

## 2016-10-03 DIAGNOSIS — C12 Malignant neoplasm of pyriform sinus: Secondary | ICD-10-CM | POA: Diagnosis not present

## 2016-10-03 NOTE — Progress Notes (Signed)
I met Mr. O[place]ida [place] and [place] before his first radiation treatment for prostate cancer. I introduced myself to them as the nurse navigator for prostate cancer, gave him my business card and explained my role. Mr. Hernandez just completed radiation for sinus cancer in October and he is familiar with the radiation department and  with the role of the navigator.   We reviewed the radiation procedure and all questions were answered. I will continue to follow and asked them to call me with any questions or concerns. He was provided with a calendar of his appointments.

## 2016-10-04 ENCOUNTER — Ambulatory Visit
Admission: RE | Admit: 2016-10-04 | Discharge: 2016-10-04 | Disposition: A | Discharge status: Home / Self Care | Payer: Medicare HMO | Source: Ambulatory Visit | Attending: Radiation Oncology | Admitting: Radiation Oncology

## 2016-10-04 DIAGNOSIS — Z85818 Personal history of malignant neoplasm of other sites of lip, oral cavity, and pharynx: Secondary | ICD-10-CM | POA: Insufficient documentation

## 2016-10-04 DIAGNOSIS — C61 Malignant neoplasm of prostate: Secondary | ICD-10-CM | POA: Insufficient documentation

## 2016-10-04 DIAGNOSIS — Z510 Encounter for antineoplastic radiation therapy: Secondary | ICD-10-CM | POA: Diagnosis not present

## 2016-10-04 DIAGNOSIS — E43 Unspecified severe protein-calorie malnutrition: Secondary | ICD-10-CM | POA: Insufficient documentation

## 2016-10-05 ENCOUNTER — Ambulatory Visit
Admission: RE | Admit: 2016-10-05 | Discharge: 2016-10-05 | Disposition: A | Discharge status: Home / Self Care | Payer: Medicare HMO | Source: Ambulatory Visit | Attending: Radiation Oncology | Admitting: Radiation Oncology

## 2016-10-05 DIAGNOSIS — Z510 Encounter for antineoplastic radiation therapy: Secondary | ICD-10-CM | POA: Diagnosis not present

## 2016-10-06 ENCOUNTER — Ambulatory Visit
Admission: RE | Admit: 2016-10-06 | Discharge: 2016-10-06 | Disposition: A | Discharge status: Home / Self Care | Payer: Medicare HMO | Source: Ambulatory Visit | Attending: Radiation Oncology | Admitting: Radiation Oncology

## 2016-10-06 DIAGNOSIS — Z510 Encounter for antineoplastic radiation therapy: Secondary | ICD-10-CM | POA: Diagnosis not present

## 2016-10-07 ENCOUNTER — Ambulatory Visit
Admission: RE | Admit: 2016-10-07 | Discharge: 2016-10-07 | Disposition: A | Discharge status: Home / Self Care | Payer: Medicare HMO | Source: Ambulatory Visit | Attending: Radiation Oncology | Admitting: Radiation Oncology

## 2016-10-07 VITALS — BP 147/94 | HR 97 | Resp 18 | Wt 108.2 lb

## 2016-10-07 DIAGNOSIS — C61 Malignant neoplasm of prostate: Secondary | ICD-10-CM

## 2016-10-07 DIAGNOSIS — Z510 Encounter for antineoplastic radiation therapy: Secondary | ICD-10-CM | POA: Diagnosis not present

## 2016-10-07 NOTE — Addendum Note (Signed)
Encounter addended by: Heywood Footman, RN on: 10/07/2016  4:45 PM<BR>    Actions taken: Patient Education assessment filed

## 2016-10-07 NOTE — Progress Notes (Signed)
Weight stable. BP elevated. Requesting referral to new PCP because he feels as though the present one isn't managing his hypertension well. Denies pain.  Reports nocturia x 2-3. Denies dysuria or hematuria. Reports intermittent urine stream with difficulty emptying completely. Reports urinary leakage. Reports 1 ppd. Reports occasional urinary urgency and frequency. Reports he alternates between diarrhea and constipation. Denies fatigue.   BP (!) 147/94 (BP Location: Left Arm, Patient Position: Sitting, Cuff Size: Normal)   Pulse 97   Resp 18   Wt 108 lb 3.2 oz (49.1 kg)   SpO2 100%   BMI 18.57 kg/m  Wt Readings from Last 3 Encounters:  10/07/16 108 lb 3.2 oz (49.1 kg)  08/02/16 103 lb (46.7 kg)  07/06/16 102 lb 9.6 oz (46.5 kg)

## 2016-10-07 NOTE — Progress Notes (Signed)
  Radiation Oncology         [phone]   Name: Adam Marshall MRN: [id number]   Date: 10/07/2016  DOB: Aug 21, 1961     Weekly Radiation Therapy Management    ICD-9-CM ICD-10-CM   1. Malignant neoplasm of prostate (HCC) 185 C61     Current Dose: 9.75 Gy  Planned Dose:  78 Gy  Narrative The patient presents for routine under treatment assessment.  Weight stable. BP elevated. Requesting referral to new PCP because he feels as though the present one isn't managing his hypertension well. Denies pain. Reports nocturia x 2-3. Denies dysuria or hematuria. Reports intermittent urine stream with difficulty emptying completely. Reports urinary leakage. Reports 1 ppd. Reports occasional urinary urgency and frequency. Reports he alternates between diarrhea and constipation. Denies fatigue.     Set-up films were reviewed. The chart was checked.  Physical Findings  weight is 108 lb 3.2 oz (49.1 kg). His blood pressure is 147/94 (abnormal) and his pulse is 97. His respiration is 18 and oxygen saturation is 100%. . Weight essentially stable.  No significant changes. Alert and in no acute distress.   Impression The patient is tolerating radiation.  Plan Continue treatment as planned.     Sheral Apley [name], M.D.  This document serves as a record of services personally performed by [name], MD. It was created on his behalf by Arlyce Harman, a trained medical scribe. The creation of this record is based on the scribe's personal observations and the provider's statements to them. This document has been checked and approved by the attending provider.

## 2016-10-10 ENCOUNTER — Ambulatory Visit
Admission: RE | Admit: 2016-10-10 | Discharge: 2016-10-10 | Disposition: A | Discharge status: Home / Self Care | Payer: Medicare HMO | Source: Ambulatory Visit | Attending: Radiation Oncology | Admitting: Radiation Oncology

## 2016-10-10 DIAGNOSIS — Z510 Encounter for antineoplastic radiation therapy: Secondary | ICD-10-CM | POA: Diagnosis not present

## 2016-10-11 ENCOUNTER — Ambulatory Visit
Admission: RE | Admit: 2016-10-11 | Discharge: 2016-10-11 | Disposition: A | Discharge status: Home / Self Care | Payer: Medicare HMO | Source: Ambulatory Visit | Attending: Radiation Oncology | Admitting: Radiation Oncology

## 2016-10-11 DIAGNOSIS — Z510 Encounter for antineoplastic radiation therapy: Secondary | ICD-10-CM | POA: Diagnosis not present

## 2016-10-12 ENCOUNTER — Ambulatory Visit
Admission: RE | Admit: 2016-10-12 | Discharge: 2016-10-12 | Disposition: A | Discharge status: Home / Self Care | Payer: Medicare HMO | Source: Ambulatory Visit | Attending: Radiation Oncology | Admitting: Radiation Oncology

## 2016-10-12 DIAGNOSIS — Z510 Encounter for antineoplastic radiation therapy: Secondary | ICD-10-CM | POA: Diagnosis not present

## 2016-10-13 ENCOUNTER — Ambulatory Visit
Admission: RE | Admit: 2016-10-13 | Discharge: 2016-10-13 | Disposition: A | Discharge status: Home / Self Care | Payer: Medicare HMO | Source: Ambulatory Visit | Attending: Radiation Oncology | Admitting: Radiation Oncology

## 2016-10-13 DIAGNOSIS — Z510 Encounter for antineoplastic radiation therapy: Secondary | ICD-10-CM | POA: Diagnosis not present

## 2016-10-14 ENCOUNTER — Ambulatory Visit: Admission: RE | Admit: 2016-10-14 | Payer: Medicare HMO | Source: Ambulatory Visit | Admitting: Radiation Oncology

## 2016-10-14 ENCOUNTER — Ambulatory Visit: Payer: Medicare HMO

## 2016-10-17 ENCOUNTER — Ambulatory Visit
Admission: RE | Admit: 2016-10-17 | Discharge: 2016-10-17 | Disposition: A | Discharge status: Home / Self Care | Payer: Medicare HMO | Source: Ambulatory Visit | Attending: Radiation Oncology | Admitting: Radiation Oncology

## 2016-10-17 VITALS — BP 169/108 | HR 117 | Temp 98.4°F | Resp 18 | Wt 108.6 lb

## 2016-10-17 DIAGNOSIS — C61 Malignant neoplasm of prostate: Secondary | ICD-10-CM

## 2016-10-17 DIAGNOSIS — C139 Malignant neoplasm of hypopharynx, unspecified: Secondary | ICD-10-CM

## 2016-10-17 DIAGNOSIS — E43 Unspecified severe protein-calorie malnutrition: Secondary | ICD-10-CM

## 2016-10-17 DIAGNOSIS — Z510 Encounter for antineoplastic radiation therapy: Secondary | ICD-10-CM | POA: Diagnosis not present

## 2016-10-17 NOTE — Progress Notes (Signed)
  Radiation Oncology         [phone]   Name: Adam Marshall MRN: [id number]   Date: 10/17/2016  DOB: 04-03-1961     Weekly Radiation Therapy Management    ICD-9-CM ICD-10-CM   1. Malignant neoplasm of prostate ([place]) 185 C61   2. 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 Protein-calorie malnutrition, severe (HCC) 262 E43     Current Dose: 19.5 Gy  Planned Dose:  78 Gy  Narrative The patient presents for routine under treatment assessment.  Weight stable. BP elevated. Reports he has to take 1 of 2 intended doses of Inderal today. Reports a headache. Denies feeling lightheaded. Reports dysuria, nocturia x 15, and frequency on Saturday. Since Saturday, dysuria has continued but, frequency and nocturia are less. Reports nocturia x 4 last night. Reports occasional leakage. Reports wearing one depends per day. Reports Saturday night his urine stream was intermittent but, is steady now. Reports urinary urgency. Reports he was constipated Saturday but, finally had a bowel movement yesterday morning. He takes a stool softener daily, but had forgotten to take it.   Set-up films were reviewed. The chart was checked.  Physical Findings  weight is 108 lb 9.6 oz (49.3 kg). His oral temperature is 98.4 F (36.9 C). His blood pressure is 169/108 (abnormal) and his pulse is 117 (abnormal). His respiration is 18 and oxygen saturation is 100%. . Weight essentially stable. BP and pulse elevated. No significant changes. Alert and in no acute distress.   Impression The patient is tolerating radiation.  He has no recurrence from his hypopharyngeal cancer.  He is gaining weight from malnutrition  Plan Continue treatment as planned.     Sheral Apley [name], M.D.  This document serves as a record of services personally performed by [name], MD. It was created on his behalf by Arlyce Harman, a trained medical scribe. The creation of this record is based on the scribe's personal observations  and the provider's statements to them. This document has been checked and approved by the attending provider.

## 2016-10-17 NOTE — Progress Notes (Signed)
Weight stable. BP elevated. Reports he has take 1 of 2 intended doses of Inderal today. Reports a headache. Denies feeling lightheaded. Reports dysuria, nocturia x 15 and frequency on Saturday. Since Saturday dysuria has continued but, frequency and nocturia are less. Reports nocturia x 4 last night. Reports occasional leakage. Reports wearing one depends per day. Reports Saturday night his urine stream was intermittent but, is steady now. Reports urinary urgency. Reports he was constipated Saturday but, finally had a BM yesterday morning.   BP (!) 169/108 (BP Location: Left Arm, Patient Position: Sitting, Cuff Size: Normal)   Pulse (!) 117   Temp 98.4 F (36.9 C) (Oral)   Resp 18   Wt 108 lb 9.6 oz (49.3 kg)   SpO2 100%   BMI 18.64 kg/m  Wt Readings from Last 3 Encounters:  10/17/16 108 lb 9.6 oz (49.3 kg)  10/07/16 108 lb 3.2 oz (49.1 kg)  08/02/16 103 lb (46.7 kg)

## 2016-10-18 ENCOUNTER — Ambulatory Visit
Admission: RE | Admit: 2016-10-18 | Discharge: 2016-10-18 | Disposition: A | Discharge status: Home / Self Care | Payer: Medicare HMO | Source: Ambulatory Visit | Attending: Radiation Oncology | Admitting: Radiation Oncology

## 2016-10-18 DIAGNOSIS — Z510 Encounter for antineoplastic radiation therapy: Secondary | ICD-10-CM | POA: Diagnosis not present

## 2016-10-19 ENCOUNTER — Ambulatory Visit
Admission: RE | Admit: 2016-10-19 | Discharge: 2016-10-19 | Disposition: A | Discharge status: Home / Self Care | Payer: Medicare HMO | Source: Ambulatory Visit | Attending: Radiation Oncology | Admitting: Radiation Oncology

## 2016-10-19 DIAGNOSIS — Z510 Encounter for antineoplastic radiation therapy: Secondary | ICD-10-CM | POA: Diagnosis not present

## 2016-10-20 ENCOUNTER — Ambulatory Visit
Admission: RE | Admit: 2016-10-20 | Discharge: 2016-10-20 | Disposition: A | Discharge status: Home / Self Care | Payer: Medicare HMO | Source: Ambulatory Visit | Attending: Radiation Oncology | Admitting: Radiation Oncology

## 2016-10-20 ENCOUNTER — Encounter: Payer: Self-pay | Admitting: Medical Oncology

## 2016-10-20 DIAGNOSIS — Z510 Encounter for antineoplastic radiation therapy: Secondary | ICD-10-CM | POA: Diagnosis not present

## 2016-10-20 NOTE — Progress Notes (Signed)
Adam Marshall states that he is doing well with radiation. He has some urinary side effects but so far they are manageable. I asked about his significant other and he states she had her surgery Friday and is at home recovering. I will continue to follow.

## 2016-10-21 ENCOUNTER — Ambulatory Visit
Admission: RE | Admit: 2016-10-21 | Discharge: 2016-10-21 | Disposition: A | Discharge status: Home / Self Care | Payer: Medicare HMO | Source: Ambulatory Visit | Attending: Radiation Oncology | Admitting: Radiation Oncology

## 2016-10-21 ENCOUNTER — Encounter: Payer: Self-pay | Admitting: Radiation Oncology

## 2016-10-21 VITALS — BP 146/102 | HR 95 | Temp 99.0°F | Ht 64.0 in | Wt 109.0 lb

## 2016-10-21 DIAGNOSIS — C61 Malignant neoplasm of prostate: Secondary | ICD-10-CM

## 2016-10-21 DIAGNOSIS — Z510 Encounter for antineoplastic radiation therapy: Secondary | ICD-10-CM | POA: Diagnosis not present

## 2016-10-21 NOTE — Progress Notes (Addendum)
Wilman has completed 14 fractions to his prostate.  He reports having pain in both sides of his lower back that he is rating at a 5/10.  He said it started 3 days ago.  He reports having nocturia 3-4 times.  He reports his urinary stream is strong this week.  He denies having dysuria and hematuria.  He reports having constipation and is taking a stool softener.  His last bm was yesterday.  He reports having fatigue.  His bp was elevated today at 143/101 and 146/102 when retaken.  BP (!) 143/101 (BP Location: Right Arm, Patient Position: Sitting)   Pulse 95   Temp 99 F (37.2 C) (Oral)   Ht 5\' 4"  (1.626 m)   Wt 109 lb (49.4 kg)   SpO2 100%   BMI 18.71 kg/m    Wt Readings from Last 3 Encounters:  10/21/16 109 lb (49.4 kg)  10/17/16 108 lb 9.6 oz (49.3 kg)  10/07/16 108 lb 3.2 oz (49.1 kg)

## 2016-10-21 NOTE — Progress Notes (Signed)
  Radiation Oncology         [phone]   Name: Adam Marshall MRN: [id number]   Date: 10/21/2016  DOB: 10-28-1960     Weekly Radiation Therapy Management    ICD-9-CM ICD-10-CM   1. Malignant neoplasm of prostate (HCC) 185 C61     Current Dose: 27.3 Gy  Planned Dose:  78 Gy  Narrative The patient presents for routine under treatment assessment.  Adam Marshall has completed 14 fractions to his prostate.  He reports having pain in both sides of his lower back that he is rating as a 5/10. He said it started 3 days ago.  He is using oxycontin for this. He reports having nocturia x 3-4.  He reports his urinary stream is strong this week.  He denies having dysuria and hematuria.  He reports having constipation and is taking a stool softener.  His last bm was yesterday.  He reports having fatigue.  His bp was elevated today at 143/101 and 146/102 when retaken.  Set-up films were reviewed. The chart was checked.  Physical Findings  height is 5\' 4"  (1.626 m) and weight is 109 lb (49.4 kg). His oral temperature is 99 F (37.2 C). His blood pressure is 146/102 (abnormal) and his pulse is 95. His oxygen saturation is 100%. . Weight essentially stable. No significant changes. Alert and in no acute distress.   Impression The patient is tolerating radiation.  He has no recurrence from his hypopharyngeal cancer.  He is gaining weight from malnutrition.  Plan Continue treatment as planned. I advised the patient to use miralax and see his PCP regarding his BP and back pain.     Sheral Apley [name], M.D.  This document serves as a record of services personally performed by [name], MD. It was created on his behalf by Darcus Austin, a trained medical scribe. The creation of this record is based on the scribe's personal observations and the provider's statements to them. This document has been checked and approved by the attending provider.

## 2016-10-24 ENCOUNTER — Ambulatory Visit
Admission: RE | Admit: 2016-10-24 | Discharge: 2016-10-24 | Disposition: A | Discharge status: Home / Self Care | Payer: Medicare HMO | Source: Ambulatory Visit | Attending: Radiation Oncology | Admitting: Radiation Oncology

## 2016-10-24 DIAGNOSIS — Z510 Encounter for antineoplastic radiation therapy: Secondary | ICD-10-CM | POA: Diagnosis not present

## 2016-10-25 ENCOUNTER — Ambulatory Visit
Admission: RE | Admit: 2016-10-25 | Discharge: 2016-10-25 | Disposition: A | Discharge status: Home / Self Care | Payer: Medicare HMO | Source: Ambulatory Visit | Attending: Radiation Oncology | Admitting: Radiation Oncology

## 2016-10-25 ENCOUNTER — Telehealth: Payer: Self-pay | Admitting: *Deleted

## 2016-10-25 DIAGNOSIS — Z510 Encounter for antineoplastic radiation therapy: Secondary | ICD-10-CM | POA: Diagnosis not present

## 2016-10-25 NOTE — Telephone Encounter (Signed)
Rec;d call pt states his BP has been running high wanting to know what the name of BP medication he is supposed to be taking. Verified meds on med list pt states he has been taking the propanolol. Inform pt he will need to make f/u appt for evaluation. Made appt for 10/27/16...Adam Marshall

## 2016-10-26 ENCOUNTER — Ambulatory Visit
Admission: RE | Admit: 2016-10-26 | Discharge: 2016-10-26 | Disposition: A | Discharge status: Home / Self Care | Payer: Medicare HMO | Source: Ambulatory Visit | Attending: Radiation Oncology | Admitting: Radiation Oncology

## 2016-10-26 DIAGNOSIS — Z510 Encounter for antineoplastic radiation therapy: Secondary | ICD-10-CM | POA: Diagnosis not present

## 2016-10-27 ENCOUNTER — Ambulatory Visit
Admission: RE | Admit: 2016-10-27 | Discharge: 2016-10-27 | Disposition: A | Discharge status: Home / Self Care | Payer: Medicare HMO | Source: Ambulatory Visit | Attending: Radiation Oncology | Admitting: Radiation Oncology

## 2016-10-27 ENCOUNTER — Other Ambulatory Visit: Payer: Self-pay | Admitting: Family

## 2016-10-27 ENCOUNTER — Other Ambulatory Visit (INDEPENDENT_AMBULATORY_CARE_PROVIDER_SITE_OTHER): Payer: Medicare HMO

## 2016-10-27 ENCOUNTER — Ambulatory Visit (INDEPENDENT_AMBULATORY_CARE_PROVIDER_SITE_OTHER): Payer: Medicare HMO | Admitting: Family

## 2016-10-27 ENCOUNTER — Telehealth: Payer: Self-pay

## 2016-10-27 ENCOUNTER — Encounter: Payer: Self-pay | Admitting: Family

## 2016-10-27 VITALS — BP 158/102 | HR 94 | Temp 98.1°F | Resp 16 | Ht 64.0 in | Wt 112.0 lb

## 2016-10-27 DIAGNOSIS — M1A09X0 Idiopathic chronic gout, multiple sites, without tophus (tophi): Secondary | ICD-10-CM

## 2016-10-27 DIAGNOSIS — G479 Sleep disorder, unspecified: Secondary | ICD-10-CM

## 2016-10-27 DIAGNOSIS — I10 Essential (primary) hypertension: Secondary | ICD-10-CM | POA: Diagnosis not present

## 2016-10-27 DIAGNOSIS — Z510 Encounter for antineoplastic radiation therapy: Secondary | ICD-10-CM | POA: Diagnosis not present

## 2016-10-27 LAB — BASIC METABOLIC PANEL
BUN: 10 mg/dL (ref 6–23)
CHLORIDE: 98 meq/L (ref 96–112)
CO2: 31 meq/L (ref 19–32)
CREATININE: 0.79 mg/dL (ref 0.40–1.50)
Calcium: 9.7 mg/dL (ref 8.4–10.5)
GFR: 130.19 mL/min (ref >60.00)
Glucose, Bld: 89 mg/dL (ref 70–99)
Potassium: 3 mEq/L — ABNORMAL LOW (ref 3.5–5.1)
Sodium: 134 mEq/L — ABNORMAL LOW (ref 135–145)

## 2016-10-27 LAB — URIC ACID: Uric Acid, Serum: 4.9 mg/dL (ref 4.0–7.8)

## 2016-10-27 MED ORDER — PROPRANOLOL HCL 10 MG PO TABS: ORAL_TABLET | ORAL | 1 refills | Status: DC

## 2016-10-27 MED ORDER — TRAZODONE HCL 50 MG PO TABS: 25.0000 mg | ORAL_TABLET | Freq: Every evening | ORAL | 3 refills | Status: AC | PRN

## 2016-10-27 MED ORDER — CYCLOBENZAPRINE HCL 5 MG PO TABS: 5.0000 mg | ORAL_TABLET | Freq: Three times a day (TID) | ORAL | 1 refills | Status: DC | PRN

## 2016-10-27 MED ORDER — AMLODIPINE BESYLATE 10 MG PO TABS: 10.0000 mg | ORAL_TABLET | Freq: Every day | ORAL | 1 refills | Status: DC

## 2016-10-27 MED ORDER — POTASSIUM CHLORIDE ER 10 MEQ PO TBCR: 10.0000 meq | EXTENDED_RELEASE_TABLET | Freq: Every day | ORAL | 0 refills | Status: AC

## 2016-10-27 NOTE — Assessment & Plan Note (Addendum)
Hypertension above goal 140/90 with current medication regimen and no adverse side effects. Start amlodipine. Denies worse headache of life with no new symptoms of end organ damage noted on physical exam. Obtain basic metabolic profile. Continue current dosage of propranolol. Encouraged monitor blood pressures at home and follow low-sodium diet. Follow-up in one week. Blood pressure readings to determine effectiveness of medication intervention.

## 2016-10-27 NOTE — Telephone Encounter (Signed)
Medication refilled

## 2016-10-27 NOTE — Telephone Encounter (Signed)
Pt called and rx rf for cyclobenzaprine  [facility] on [address] and [address]

## 2016-10-27 NOTE — Assessment & Plan Note (Signed)
Symptoms described are consistent with gout with no findings on physical exam today. Obtain uric acid levels. Continue current dosage of allopurinol pending uric acid results.

## 2016-10-27 NOTE — Assessment & Plan Note (Signed)
Symptoms of sleep disturbance that started when he received radiation therapy. Discussed importance of sleep hygiene. Start low-dose trazodone. Encouraged to use medication as needed and consider additional over-the-counter medications including melatonin. Follow-up if symptoms worsen or do not improve.

## 2016-10-27 NOTE — Patient Instructions (Signed)
Thank you for choosing [facility].  SUMMARY AND INSTRUCTIONS:  Medication:  Please continue to take the propranolol  Start taking amlodipine.  We will make adjustments to gout medication pending your uric acid levels.  Start the trazodone as needed for sleep.   Your prescription(s) have been submitted to your pharmacy or been printed and provided for you. Please take as directed and contact our office if you believe you are having problem(s) with the medication(s) or have any questions.  Labs:  Please stop by the lab on the lower level of the building for your blood work. Your results will be released to Ruth (or called to you) after review, usually within 72 hours after test completion. If any changes need to be made, you will be notified at that same time.  1.) The lab is open from 7:30am to 5:30 pm Monday-Friday 2.) No appointment is necessary 3.) Fasting (if needed) is 6-8 hours after food and drink; black coffee and water are okay   Follow up:  If your symptoms worsen or fail to improve, please contact our office for further instruction, or in case of emergency go directly to the emergency room at the closest medical facility.

## 2016-10-27 NOTE — Progress Notes (Signed)
Subjective:    Patient ID: Adam Marshall, male    DOB: 11-06-1960, 56 y.o.   MRN: [id number]  Chief Complaint  Patient presents with  . Follow-up    think he has gout in both feet and high bp    HPI:  Adam Marshall is a 56 y.o. male who  has a past medical history of Abnormal LFTs; Cancer (Adam Marshall); Chronic back pain; Chronic knee pain; Cirrhosis (Adam Marshall); Fatty liver; Gout; Hiatal hernia; Hypertension; Pancreatic lesion; Pancreatitis; and Prostate cancer (Adam Marshall). and presents today for an office visit.  1.) Hypertension - Currently maintained on propranolol. Reports taking the medication as prescribed and denies adverse side effects. Blood pressures have been elevated for the past couple of months. Denies worst headache of life with no symptoms of end organ damage. Following a low salt diet. Physical exercise is going up and down the steps.   BP Readings from Last 3 Encounters:  10/27/16 (!) 158/102  10/21/16 (!) 146/102  10/17/16 (!) 169/108    2.) Bilateral toe pan - Currently maintained on allopurinol for gout. Reports taking the medication as prescribed and denies adverse side effects. Pain is described as achy and feeling like they are cracking under the bottom of his feet. The pain severity has been bad enough that he took an oxycodone which did help. His pain is improved today. He has concern for gout flare.   3.) Sleep disturbance - This is a new problem. Associated symptom of sleep disturbance has been going on since he started radiation on his neck. Currently sleeping about 4 hours per night. Has worked on [employer] rights [employer]. Has not taken any over the counter medication to assist.    Allergies  Allergen Reactions  . Penicillins Hives and Rash    Has patient had a PCN reaction causing immediate rash, facial/tongue/throat swelling, SOB or lightheadedness with hypotension:Yes Has patient had a PCN reaction causing severe rash involving mucus membranes or skin necrosis:unsure Has  patient had a PCN reaction that required hospitalization:No Has patient had a PCN reaction occurring within the last 10 years:No  If all of the above answers are "NO", then may proceed with Cephalosporin use.       Outpatient Medications Prior to Visit  Medication Sig Dispense Refill  . albuterol (PROVENTIL) (2.5 MG/3ML) 0.083% nebulizer solution Take 3 mLs (2.5 mg total) by nebulization every 6 (six) hours as needed for wheezing or shortness of breath. 75 mL 12  . allopurinol (ZYLOPRIM) 100 MG tablet Take 1 tablet (100 mg total) by mouth daily. 30 tablet 2  . aspirin 81 MG chewable tablet Chew 81 mg by mouth daily.    . cyclobenzaprine (FLEXERIL) 5 MG tablet Take 1 tablet (5 mg total) by mouth 3 (three) times daily as needed for muscle spasms. 60 tablet 1  . feeding supplement, ENSURE ENLIVE, (ENSURE ENLIVE) LIQD Take 237 mLs by mouth 3 (three) times daily between meals. 237 mL 12  . Multiple Vitamin (MULTIVITAMIN) tablet Take 1 tablet by mouth daily.    Marland Kitchen oxyCODONE (OXYCONTIN) 20 mg 12 hr tablet Take 1 tablet (20 mg total) by mouth every 12 (twelve) hours. 60 tablet 0  . propranolol (INDERAL) 10 MG tablet TAKE 1 TABLET(10 MG) BY MOUTH TWICE DAILY 180 tablet 0   No facility-administered medications prior to visit.       Past Surgical History:  Procedure Laterality Date  . ESOPHAGOGASTRODUODENOSCOPY N/A 03/15/2016   Procedure: ESOPHAGOGASTRODUODENOSCOPY (EGD);  Surgeon: [name],  MD;  Location: [facility];  Service: Endoscopy;  Laterality: N/A;  . KNEE SURGERY     left  . PANENDOSCOPY N/A 03/18/2016   Procedure: DIRECT LARYNGOSCOPY, WITH BRONCHOSCOPY, AND ESOPHAGOSCOPY, WITH BIOPSY;  Surgeon: [name], MD;  Location: [facility];  Service: ENT;  Laterality: N/A;  . PROSTATE BIOPSY        Past Medical History:  Diagnosis Date  . Abnormal LFTs   . 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 Chronic back pain   . Chronic knee pain    left    . Cirrhosis (Adam Marshall)   . Fatty liver   . Gout   . Hiatal hernia   . Hypertension   . Pancreatic lesion   . Pancreatitis   . Prostate cancer (Adam Marshall)       Review of Systems  Constitutional: Negative for chills and fever.  Eyes:       Negative for changes in vision  Respiratory: Negative for cough, chest tightness and wheezing.   Cardiovascular: Negative for chest pain, palpitations and leg swelling.  Musculoskeletal:       Positive for great toe pain  Neurological: Negative for dizziness, weakness and light-headedness.  Psychiatric/Behavioral: Positive for sleep disturbance. Negative for behavioral problems and dysphoric mood. The patient is not nervous/anxious.       Objective:    BP (!) 158/102 (BP Location: Left Arm, Patient Position: Sitting, Cuff Size: Normal)   Pulse 94   Temp 98.1 F (36.7 C) (Oral)   Resp 16   Ht 5\' 4"  (1.626 m)   Wt 112 lb (50.8 kg)   SpO2 95%   BMI 19.22 kg/m  Nursing note and vital signs reviewed.  Physical Exam  Constitutional: He is oriented to person, place, and time. He appears well-developed and well-nourished. No distress.  Cardiovascular: Normal rate, regular rhythm, normal heart sounds and intact distal pulses.   Pulmonary/Chest: Effort normal and breath sounds normal.  Musculoskeletal:  Left great toe - no obvious deformity or or edema with mild discoloration around the first MTP joint. No tenderness able to be elicited. Range of motion within normal limits with some discomfort noted towards the end of extension. Capillary refill intact and appropriate.  Neurological: He is alert and oriented to person, place, and time.  Skin: Skin is warm and dry.  Psychiatric: He has a normal mood and affect. His behavior is normal. Judgment and thought content normal.       Assessment & Plan:   Problem List Items Addressed This Visit      Cardiovascular and Mediastinum   HYPERTENSION, BENIGN ESSENTIAL    Hypertension above goal 140/90 with  current medication regimen and no adverse side effects. Start amlodipine. Denies worse headache of life with no new symptoms of end organ damage noted on physical exam. Obtain basic metabolic profile. Continue current dosage of propranolol. Encouraged monitor blood pressures at home and follow low-sodium diet. Follow-up in one week. Blood pressure readings to determine effectiveness of medication intervention.      Relevant Medications   amLODipine (NORVASC) 10 MG tablet   propranolol (INDERAL) 10 MG tablet   Other Relevant Orders   Basic Metabolic Panel (BMET) (Completed)     Other   Gout - Primary    Symptoms described are consistent with gout with no findings on physical exam today. Obtain uric acid levels. Continue current dosage of allopurinol pending uric acid results.      Relevant  Orders   Uric acid (Completed)   Sleep disturbance    Symptoms of sleep disturbance that started when he received radiation therapy. Discussed importance of sleep hygiene. Start low-dose trazodone. Encouraged to use medication as needed and consider additional over-the-counter medications including melatonin. Follow-up if symptoms worsen or do not improve.          I am having Mr. Karras start on amLODipine and traZODone. I am also having him maintain his aspirin, albuterol, feeding supplement (ENSURE ENLIVE), oxyCODONE, cyclobenzaprine, allopurinol, multivitamin, and propranolol.   Meds ordered this encounter  Medications  . amLODipine (NORVASC) 10 MG tablet    Sig: Take 1 tablet (10 mg total) by mouth daily.    Dispense:  30 tablet    Refill:  1    Order Specific Question:   Supervising Provider    Answer:   Pricilla Holm A [id number]  . traZODone (DESYREL) 50 MG tablet    Sig: Take 0.5-1 tablets (25-50 mg total) by mouth at bedtime as needed for sleep.    Dispense:  30 tablet    Refill:  3    Order Specific Question:   Supervising Provider    Answer:   Pricilla Holm A [id number]  .  propranolol (INDERAL) 10 MG tablet    Sig: TAKE 1 TABLET(10 MG) BY MOUTH TWICE DAILY    Dispense:  60 tablet    Refill:  1    Order Specific Question:   Supervising Provider    Answer:   Pricilla Holm A [id number]     Follow-up: Return in about 1 month (around 11/24/2016), or if symptoms worsen or fail to improve.  [name], FNP

## 2016-10-28 ENCOUNTER — Ambulatory Visit
Admission: RE | Admit: 2016-10-28 | Discharge: 2016-10-28 | Disposition: A | Discharge status: Home / Self Care | Payer: Medicare HMO | Source: Ambulatory Visit | Attending: Radiation Oncology | Admitting: Radiation Oncology

## 2016-10-28 VITALS — BP 133/86 | HR 86 | Resp 18 | Wt 111.2 lb

## 2016-10-28 DIAGNOSIS — Z510 Encounter for antineoplastic radiation therapy: Secondary | ICD-10-CM | POA: Diagnosis not present

## 2016-10-28 DIAGNOSIS — C61 Malignant neoplasm of prostate: Secondary | ICD-10-CM

## 2016-10-28 NOTE — Progress Notes (Signed)
  Radiation Oncology         [phone]   Name: Adam Marshall MRN: [id number]   Date: 10/28/2016  DOB: 1960/09/08     Weekly Radiation Therapy Management    ICD-9-CM ICD-10-CM   1. Malignant neoplasm of prostate (HCC) 185 C61     Current Dose: 37.05 Gy  Planned Dose:  78 Gy  Narrative The patient presents for routine under treatment assessment.  He was seen by PCP due to an elevated BP and inability to sleep. Patient prescribed allopurinol and trazadone. Patient reports both are helping relieve his symptoms. Reports nocturia x 4. Reports urinary urgency, urinary frequency, and constipation continues. He reports occasional dysuria. Denies leakage, incontinence, or hematuria. Reports he had a small BM yesterday and on some occasions when he voids he also passes a small amount of stool. Reports mild fatigue.   Set-up films were reviewed. The chart was checked.  Physical Findings  weight is 111 lb 3.2 oz (50.4 kg). His blood pressure is 133/86 and his pulse is 86. His respiration is 18 and oxygen saturation is 100%.   Slowly gaining weight. No significant changes. Alert and in no acute distress.   Impression The patient is tolerating radiation.  He has no recurrence from his hypopharyngeal cancer.  He is gaining weight from malnutrition.  Plan Continue treatment as planned.     Sheral Apley [name], M.D.  This document serves as a record of services personally performed by [name], MD. It was created on his behalf by Darcus Austin, a trained medical scribe. The creation of this record is based on the scribe's personal observations and the provider's statements to them. This document has been checked and approved by the attending provider.

## 2016-10-28 NOTE — Progress Notes (Signed)
Weight and vitals stable. Seen by PCP because of elevated BP and inability to sleep. Patient prescribed allopurinol and trazadone. Patient reports both are helping relieve his symptoms. Reports nocturia x 4. Reports occasional dysuria. Denies hematuria. Reports urinary urgency and frequency continues. Denies leakage or incontinence. Reports he continues to struggle with constipation. Reports he had a small BM yesterday. Reports on some occasional when he voids he also passes a small amount of stool. Reports mild fatigue.   BP 133/86 (BP Location: Left Arm, Patient Position: Sitting, Cuff Size: Normal)   Pulse 86   Resp 18   Wt 111 lb 3.2 oz (50.4 kg)   SpO2 100%   BMI 19.09 kg/m  Wt Readings from Last 3 Encounters:  10/28/16 111 lb 3.2 oz (50.4 kg)  10/27/16 112 lb (50.8 kg)  10/21/16 109 lb (49.4 kg)

## 2016-10-31 ENCOUNTER — Ambulatory Visit
Admission: RE | Admit: 2016-10-31 | Discharge: 2016-10-31 | Disposition: A | Discharge status: Home / Self Care | Payer: Medicare HMO | Source: Ambulatory Visit | Attending: Radiation Oncology | Admitting: Radiation Oncology

## 2016-10-31 DIAGNOSIS — Z510 Encounter for antineoplastic radiation therapy: Secondary | ICD-10-CM | POA: Diagnosis not present

## 2016-11-01 ENCOUNTER — Ambulatory Visit
Admission: RE | Admit: 2016-11-01 | Discharge: 2016-11-01 | Disposition: A | Discharge status: Home / Self Care | Payer: Medicare HMO | Source: Ambulatory Visit | Attending: Radiation Oncology | Admitting: Radiation Oncology

## 2016-11-01 DIAGNOSIS — Z510 Encounter for antineoplastic radiation therapy: Secondary | ICD-10-CM | POA: Diagnosis not present

## 2016-11-02 ENCOUNTER — Ambulatory Visit: Payer: Medicare HMO

## 2016-11-03 ENCOUNTER — Ambulatory Visit: Payer: Medicare HMO

## 2016-11-04 ENCOUNTER — Ambulatory Visit: Admission: RE | Admit: 2016-11-04 | Payer: Medicare HMO | Source: Ambulatory Visit | Admitting: Radiation Oncology

## 2016-11-04 ENCOUNTER — Ambulatory Visit: Payer: Medicare HMO

## 2016-11-07 ENCOUNTER — Ambulatory Visit
Admission: RE | Admit: 2016-11-07 | Discharge: 2016-11-07 | Disposition: A | Discharge status: Home / Self Care | Payer: Medicare HMO | Source: Ambulatory Visit | Attending: Radiation Oncology | Admitting: Radiation Oncology

## 2016-11-07 ENCOUNTER — Other Ambulatory Visit: Payer: Self-pay | Admitting: Radiation Oncology

## 2016-11-07 ENCOUNTER — Other Ambulatory Visit: Payer: Self-pay | Admitting: *Deleted

## 2016-11-07 ENCOUNTER — Inpatient Hospital Stay
Admission: RE | Admit: 2016-11-07 | Discharge: 2016-11-07 | Disposition: A | Discharge status: Home / Self Care | Payer: Self-pay | Source: Ambulatory Visit | Attending: Radiation Oncology | Admitting: Radiation Oncology

## 2016-11-07 VITALS — BP 129/85 | HR 87 | Temp 98.4°F | Resp 18

## 2016-11-07 DIAGNOSIS — C61 Malignant neoplasm of prostate: Secondary | ICD-10-CM

## 2016-11-07 DIAGNOSIS — R300 Dysuria: Secondary | ICD-10-CM

## 2016-11-07 DIAGNOSIS — Z510 Encounter for antineoplastic radiation therapy: Secondary | ICD-10-CM | POA: Diagnosis not present

## 2016-11-07 LAB — URINALYSIS, MICROSCOPIC - CHCC
Bilirubin (Urine): NEGATIVE
GLUCOSE UR CHCC: NEGATIVE mg/dL
KETONES: NEGATIVE mg/dL
Nitrite: NEGATIVE
Protein: 30 mg/dL
SPECIFIC GRAVITY, URINE: 1.03 (ref 1.003–1.035)
Urobilinogen, UR: 0.2 mg/dL (ref 0.2–1)
pH: 5 (ref 4.6–8.0)

## 2016-11-07 NOTE — Progress Notes (Signed)
  Radiation Oncology         [phone]   Name: Adam Marshall MRN: [id number]   Date: 11/07/2016  DOB: 1961-02-13     Weekly Radiation Therapy Management    ICD-9-CM ICD-10-CM   1. Malignant neoplasm of prostate ([place]) 185 C61     Current Dose: Reviewed/Approved Planned Dose:  78 Gy  Narrative The patient presents for routine under treatment assessment.  The patient was seen in the treatment area. The patient complains of dysuria and flank pain.   Set-up films were reviewed. The chart was checked.  Physical Findings  oral temperature is 98.4 F (36.9 C). His blood pressure is 129/85 and his pulse is 87. His respiration is 18.   Slowly gaining weight. No significant changes. Alert and in no acute distress. CVA tenderness bilaterally.  Impression The patient is tolerating radiation.   Plan Continue treatment as planned. The patient will have urinalysis and culture today to rule out UTI.     Sheral Apley [name], M.D.  This document serves as a record of services personally performed by [name], MD and Shona Simpson, PA-C. It was created on their behalf by Arlyce Harman, a trained medical scribe. The creation of this record is based on the scribe's personal observations and the provider's statements to them. This document has been checked and approved by the attending provider.

## 2016-11-07 NOTE — Progress Notes (Signed)
[name] reports having tingling and burning on urination  And using the  Bathroom more frequently over weekend.  Saw Dr. [name] in the hallway was told to collect a urine specimen to have it checked for infection. Shona Simpson, PA in to assess asked to purchase AZO and a urine specimen was collected by [name] and taken to the lab for urine culture and urinalysis.  [name] is aware that he will be notified tomorrow about the results of his urine specimen.

## 2016-11-08 ENCOUNTER — Telehealth: Payer: Self-pay | Admitting: Radiation Oncology

## 2016-11-08 ENCOUNTER — Ambulatory Visit
Admission: RE | Admit: 2016-11-08 | Discharge: 2016-11-08 | Disposition: A | Discharge status: Home / Self Care | Payer: Medicare HMO | Source: Ambulatory Visit | Attending: Radiation Oncology | Admitting: Radiation Oncology

## 2016-11-08 DIAGNOSIS — Z510 Encounter for antineoplastic radiation therapy: Secondary | ICD-10-CM | POA: Diagnosis not present

## 2016-11-08 MED ORDER — SULFAMETHOXAZOLE-TRIMETHOPRIM 800-160 MG PO TABS: 1.0000 | ORAL_TABLET | Freq: Two times a day (BID) | ORAL | 0 refills | Status: DC

## 2016-11-08 NOTE — Telephone Encounter (Signed)
Patient phoned back. He requested his prescription be sent to [facility]. Explained he has a UTI and that I will be calling in Bactrim to his pharmacy. Instructed him to take one tablet by mouth twice per day until they are all gone. Patient verbalized understanding.

## 2016-11-08 NOTE — Telephone Encounter (Signed)
Left message on [facility] pharmacy prescribers line with new prescription for Bactrim DS.

## 2016-11-08 NOTE — Telephone Encounter (Signed)
Phoned patient's significant other to determine preferred pharmacy. No answer. Left message requesting return call.

## 2016-11-08 NOTE — Addendum Note (Signed)
Addended by: Heywood Footman on: 11/08/2016 08:53 AM   Modules accepted: Orders

## 2016-11-08 NOTE — Telephone Encounter (Signed)
Phoned patient to inquire about pharmacy preference. No answer. Left message requesting return call.

## 2016-11-08 NOTE — Telephone Encounter (Signed)
-----   Message from Hayden Pedro, PA-C sent at 11/08/2016  7:58 AM EST ----- Can you please start him on Bactrim DS #10, no refills, one po BID x 5 days? We'll await culture results too.  Thanks, Me ----- Message ----- From: Interface, Lab In Three Zero One Sent: 11/07/2016   4:35 PM To: Hayden Pedro, PA-C

## 2016-11-09 ENCOUNTER — Ambulatory Visit
Admission: RE | Admit: 2016-11-09 | Discharge: 2016-11-09 | Disposition: A | Discharge status: Home / Self Care | Payer: Medicare HMO | Source: Ambulatory Visit | Attending: Radiation Oncology | Admitting: Radiation Oncology

## 2016-11-09 DIAGNOSIS — Z510 Encounter for antineoplastic radiation therapy: Secondary | ICD-10-CM | POA: Diagnosis not present

## 2016-11-09 LAB — URINE CULTURE

## 2016-11-10 ENCOUNTER — Inpatient Hospital Stay
Admission: RE | Admit: 2016-11-10 | Discharge: 2016-11-10 | Disposition: A | Discharge status: Home / Self Care | Payer: Self-pay | Source: Ambulatory Visit | Attending: Radiation Oncology | Admitting: Radiation Oncology

## 2016-11-10 ENCOUNTER — Ambulatory Visit
Admission: RE | Admit: 2016-11-10 | Discharge: 2016-11-10 | Disposition: A | Discharge status: Home / Self Care | Payer: Medicare HMO | Source: Ambulatory Visit | Attending: Radiation Oncology | Admitting: Radiation Oncology

## 2016-11-10 DIAGNOSIS — Z510 Encounter for antineoplastic radiation therapy: Secondary | ICD-10-CM | POA: Diagnosis not present

## 2016-11-10 DIAGNOSIS — C61 Malignant neoplasm of prostate: Secondary | ICD-10-CM

## 2016-11-11 ENCOUNTER — Ambulatory Visit
Admission: RE | Admit: 2016-11-11 | Discharge: 2016-11-11 | Disposition: A | Discharge status: Home / Self Care | Payer: Medicare HMO | Source: Ambulatory Visit | Attending: Radiation Oncology | Admitting: Radiation Oncology

## 2016-11-11 DIAGNOSIS — Z510 Encounter for antineoplastic radiation therapy: Secondary | ICD-10-CM | POA: Diagnosis not present

## 2016-11-14 ENCOUNTER — Ambulatory Visit: Payer: Medicare HMO

## 2016-11-15 ENCOUNTER — Ambulatory Visit
Admission: RE | Admit: 2016-11-15 | Discharge: 2016-11-15 | Disposition: A | Discharge status: Home / Self Care | Payer: Medicare HMO | Source: Ambulatory Visit | Attending: Radiation Oncology | Admitting: Radiation Oncology

## 2016-11-15 DIAGNOSIS — Z510 Encounter for antineoplastic radiation therapy: Secondary | ICD-10-CM | POA: Diagnosis not present

## 2016-11-16 ENCOUNTER — Ambulatory Visit
Admission: RE | Admit: 2016-11-16 | Discharge: 2016-11-16 | Disposition: A | Discharge status: Home / Self Care | Payer: Medicare HMO | Source: Ambulatory Visit | Attending: Radiation Oncology | Admitting: Radiation Oncology

## 2016-11-16 DIAGNOSIS — Z510 Encounter for antineoplastic radiation therapy: Secondary | ICD-10-CM | POA: Diagnosis not present

## 2016-11-17 ENCOUNTER — Ambulatory Visit
Admission: RE | Admit: 2016-11-17 | Discharge: 2016-11-17 | Disposition: A | Discharge status: Home / Self Care | Payer: Medicare HMO | Source: Ambulatory Visit | Attending: Radiation Oncology | Admitting: Radiation Oncology

## 2016-11-17 DIAGNOSIS — Z510 Encounter for antineoplastic radiation therapy: Secondary | ICD-10-CM | POA: Diagnosis not present

## 2016-11-17 NOTE — Progress Notes (Signed)
Patient reports new onset diarrhea. Encouraged patient to pick up Imodium AD over the counter. Directed patient upon use of Imodium to avoid constipation. Wrote the name Imodium AD on a sticky note for the patient to take to the pharmacy. Patient reassures this nurse he has the money to pay for this product. Patient will be seen tomorrow by Dr. [name] for his weekly PUT. Will plan to question if patient felt relief with Imodium. Also, encouraged patient to hydrate to replenish fluids depleted from diarrhea. Patient verbalized understanding of all reviewed.

## 2016-11-18 ENCOUNTER — Ambulatory Visit
Admission: RE | Admit: 2016-11-18 | Discharge: 2016-11-18 | Disposition: A | Discharge status: Home / Self Care | Payer: Medicare HMO | Source: Ambulatory Visit | Attending: Radiation Oncology | Admitting: Radiation Oncology

## 2016-11-18 DIAGNOSIS — Z510 Encounter for antineoplastic radiation therapy: Secondary | ICD-10-CM | POA: Diagnosis not present

## 2016-11-20 DIAGNOSIS — Z510 Encounter for antineoplastic radiation therapy: Secondary | ICD-10-CM | POA: Diagnosis not present

## 2016-11-21 ENCOUNTER — Ambulatory Visit
Admission: RE | Admit: 2016-11-21 | Discharge: 2016-11-21 | Disposition: A | Discharge status: Home / Self Care | Payer: Medicare HMO | Source: Ambulatory Visit | Attending: Radiation Oncology | Admitting: Radiation Oncology

## 2016-11-21 ENCOUNTER — Ambulatory Visit: Payer: Self-pay

## 2016-11-21 ENCOUNTER — Ambulatory Visit: Payer: Medicare HMO | Attending: Radiation Oncology

## 2016-11-21 ENCOUNTER — Encounter: Payer: Self-pay | Admitting: Medical Oncology

## 2016-11-21 DIAGNOSIS — Z510 Encounter for antineoplastic radiation therapy: Secondary | ICD-10-CM | POA: Diagnosis not present

## 2016-11-22 ENCOUNTER — Ambulatory Visit
Admission: RE | Admit: 2016-11-22 | Discharge: 2016-11-22 | Disposition: A | Discharge status: Home / Self Care | Payer: Medicare HMO | Source: Ambulatory Visit | Attending: Radiation Oncology | Admitting: Radiation Oncology

## 2016-11-22 DIAGNOSIS — Z510 Encounter for antineoplastic radiation therapy: Secondary | ICD-10-CM | POA: Diagnosis not present

## 2016-11-23 ENCOUNTER — Ambulatory Visit
Admission: RE | Admit: 2016-11-23 | Discharge: 2016-11-23 | Disposition: A | Discharge status: Home / Self Care | Payer: Medicare HMO | Source: Ambulatory Visit | Attending: Radiation Oncology | Admitting: Radiation Oncology

## 2016-11-23 DIAGNOSIS — Z510 Encounter for antineoplastic radiation therapy: Secondary | ICD-10-CM | POA: Diagnosis not present

## 2016-11-24 ENCOUNTER — Ambulatory Visit
Admission: RE | Admit: 2016-11-24 | Discharge: 2016-11-24 | Disposition: A | Discharge status: Home / Self Care | Payer: Medicare HMO | Source: Ambulatory Visit | Attending: Radiation Oncology | Admitting: Radiation Oncology

## 2016-11-24 DIAGNOSIS — Z510 Encounter for antineoplastic radiation therapy: Secondary | ICD-10-CM | POA: Diagnosis not present

## 2016-11-25 ENCOUNTER — Ambulatory Visit: Payer: Medicare HMO

## 2016-11-25 ENCOUNTER — Ambulatory Visit
Admission: RE | Admit: 2016-11-25 | Discharge: 2016-11-25 | Disposition: A | Discharge status: Home / Self Care | Payer: Medicare HMO | Source: Ambulatory Visit | Attending: Radiation Oncology | Admitting: Radiation Oncology

## 2016-11-25 DIAGNOSIS — Z510 Encounter for antineoplastic radiation therapy: Secondary | ICD-10-CM | POA: Diagnosis not present

## 2016-11-28 ENCOUNTER — Ambulatory Visit
Admission: RE | Admit: 2016-11-28 | Discharge: 2016-11-28 | Disposition: A | Discharge status: Home / Self Care | Payer: Medicare HMO | Source: Ambulatory Visit | Attending: Radiation Oncology | Admitting: Radiation Oncology

## 2016-11-28 ENCOUNTER — Ambulatory Visit: Payer: Medicare HMO

## 2016-11-28 DIAGNOSIS — Z510 Encounter for antineoplastic radiation therapy: Secondary | ICD-10-CM | POA: Diagnosis not present

## 2016-11-29 ENCOUNTER — Ambulatory Visit
Admission: RE | Admit: 2016-11-29 | Discharge: 2016-11-29 | Disposition: A | Discharge status: Home / Self Care | Payer: Medicare HMO | Source: Ambulatory Visit | Attending: Radiation Oncology | Admitting: Radiation Oncology

## 2016-11-29 ENCOUNTER — Ambulatory Visit: Payer: Medicare HMO

## 2016-11-29 DIAGNOSIS — Z510 Encounter for antineoplastic radiation therapy: Secondary | ICD-10-CM | POA: Diagnosis not present

## 2016-11-30 ENCOUNTER — Ambulatory Visit: Payer: Medicare HMO

## 2016-11-30 ENCOUNTER — Ambulatory Visit
Admission: RE | Admit: 2016-11-30 | Discharge: 2016-11-30 | Disposition: A | Discharge status: Home / Self Care | Payer: Medicare HMO | Source: Ambulatory Visit | Attending: Radiation Oncology | Admitting: Radiation Oncology

## 2016-11-30 DIAGNOSIS — Z510 Encounter for antineoplastic radiation therapy: Secondary | ICD-10-CM | POA: Diagnosis not present

## 2016-12-01 ENCOUNTER — Ambulatory Visit
Admission: RE | Admit: 2016-12-01 | Discharge: 2016-12-01 | Disposition: A | Discharge status: Home / Self Care | Payer: Medicare HMO | Source: Ambulatory Visit | Attending: Radiation Oncology | Admitting: Radiation Oncology

## 2016-12-01 ENCOUNTER — Ambulatory Visit: Payer: Medicare HMO

## 2016-12-01 DIAGNOSIS — Z510 Encounter for antineoplastic radiation therapy: Secondary | ICD-10-CM | POA: Diagnosis not present

## 2016-12-02 ENCOUNTER — Ambulatory Visit
Admission: RE | Admit: 2016-12-02 | Discharge: 2016-12-02 | Disposition: A | Discharge status: Home / Self Care | Payer: Medicare HMO | Source: Ambulatory Visit | Attending: Radiation Oncology | Admitting: Radiation Oncology

## 2016-12-02 ENCOUNTER — Encounter: Payer: Self-pay | Admitting: Radiation Oncology

## 2016-12-02 DIAGNOSIS — Z510 Encounter for antineoplastic radiation therapy: Secondary | ICD-10-CM | POA: Diagnosis not present

## 2016-12-06 ENCOUNTER — Telehealth: Payer: Self-pay | Admitting: Nutrition

## 2016-12-06 NOTE — Telephone Encounter (Signed)
error 

## 2016-12-08 ENCOUNTER — Telehealth: Payer: Self-pay | Admitting: Radiation Oncology

## 2016-12-08 NOTE — Telephone Encounter (Signed)
Phoned patient to inquire about status. Reports his wife remains hospitalized on a ventilator. Patient denies any additional needs at this time. Patient understands to call this RN with future needs.

## 2016-12-14 NOTE — Progress Notes (Signed)
  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   Beams/energy:   IMRT // 6X  Narrative: The patient tolerated radiation treatment relatively well.   He experienced modest fatigue and mild urinary symptoms including nocturia x 3-4, urge to urinate then inability to void, and urinary leakage. He reports taking AZO regularly to manage dysuria. Denies hematuria.   Plan: The patient has completed radiation treatment. The patient will return to radiation oncology clinic for routine followup in one month. I advised him to call or return sooner if he has any questions or concerns related to his recovery or treatment. ________________________________  Sheral Apley. [name], M.D.   This document serves as a record of services personally performed by [name], MD. It was created on his behalf by Arlyce Harman, a trained medical scribe. The creation of this record is based on the scribe's personal observations and the provider's statements to them. This document has been checked and approved by the attending provider.

## 2016-12-20 ENCOUNTER — Telehealth: Payer: Self-pay | Admitting: *Deleted

## 2016-12-20 NOTE — Telephone Encounter (Signed)
CALLED PATIENT TO ALTER FU ON 01-04-17, APPT. MOVED TO 01-10-17 @ 2 PM, PATIENT AGREED TO NEW APPT. DATE AND TIME

## 2016-12-26 ENCOUNTER — Telehealth: Payer: Self-pay | Admitting: Radiation Oncology

## 2016-12-26 NOTE — Telephone Encounter (Signed)
Returned message left by patient. Patient reports new onset dysuria since Wednesday, April 18th. Patient reports the dysuria had gradually gotten worse. He explains that on Saturday he began taking AZO (over the counter) tid which helps. Per Bryson Ha requested patient present today or tomorrow to provide urine for culture to rule out infection. Patient reports he will try to come tomorrow. Explained he would need to contact this RN before presenting so that orders and a seat could be entered. Patient verbalized understanding. Patient reports he has to have his long time girlfriend's belonging out of her apartment by the end of the day tomorrow. Patient's girlfriend passed away. Will continue to check in with patient and encouraged him to call back with needs.

## 2016-12-27 ENCOUNTER — Telehealth: Payer: Self-pay | Admitting: Radiation Oncology

## 2016-12-27 ENCOUNTER — Other Ambulatory Visit: Payer: Self-pay | Admitting: Radiation Oncology

## 2016-12-27 DIAGNOSIS — R300 Dysuria: Secondary | ICD-10-CM

## 2016-12-27 NOTE — Telephone Encounter (Signed)
Phoned patient making him aware of lab appointment tomorrow at 1000. Patient understands this RN will call with urine culture results after 48 hours.

## 2016-12-28 ENCOUNTER — Ambulatory Visit
Admission: RE | Admit: 2016-12-28 | Discharge: 2016-12-28 | Disposition: A | Discharge status: Home / Self Care | Payer: Medicare HMO | Source: Ambulatory Visit | Attending: Radiation Oncology | Admitting: Radiation Oncology

## 2016-12-28 DIAGNOSIS — R300 Dysuria: Secondary | ICD-10-CM

## 2016-12-29 ENCOUNTER — Other Ambulatory Visit: Payer: Self-pay | Admitting: Family

## 2016-12-30 ENCOUNTER — Telehealth: Payer: Self-pay | Admitting: Radiation Oncology

## 2016-12-30 LAB — URINE CULTURE

## 2016-12-30 NOTE — Telephone Encounter (Signed)
Left message requesting patient return my call to discuss culture.

## 2016-12-30 NOTE — Telephone Encounter (Signed)
-----   Message from Freeman Caldron, [place] sent at 12/30/2016 12:40 PM EDT ----- Regarding: RE: Antibiotics Adam Marshall, It looks like final results are still pending for culture performed on 12/28/16.  However, I am ok with you sending him a Rx for Septra DS po BID x7 days based on previous C&S from 11/07/16.  Just let him know that we will call him as soon as the final culture report is available IF we need to switch abx based on sensitivities.  I would feel better having him on abx over the weekend since he is symptomatic. Thank you! -Ashlyn ----- Message ----- From: Heywood Footman, RN Sent: 12/30/2016  10:54 AM To: [name], MD, Freeman Caldron, PA-C, # Subject: Antibiotics                                    Ashlyn.  This patient had a urine culture two days ago to determine the cause of his dysuria. He was taking OTC AZO thus, only a culture was done not an analysis. The culture came back positive to e. Coli. Would you like to prescribe any antibiotics?  Adam Marshall

## 2017-01-03 ENCOUNTER — Telehealth: Payer: Self-pay | Admitting: Radiation Oncology

## 2017-01-03 ENCOUNTER — Telehealth: Payer: Self-pay | Admitting: Urology

## 2017-01-03 NOTE — Telephone Encounter (Signed)
Phoned patient to inquire about status. Patient reports he recently spoke with Ashlyn, PA-C and plans to see his urologist tomorrow at [place]. Patient reports his symptoms are still present but, worse at night. Patient anxious to find resolution to these symptoms. Will follow up with patient tomorrow afternoon.

## 2017-01-03 NOTE — Telephone Encounter (Addendum)
I called and spoke with the patient to inform of recent Urine culture results and follow up on LUTS.  His Urine Culture grew multiple organizsms with none predominant.  He continues with dysuria, urgency, frequency, nocturia qhour/night and UUI requiring use of multiple depends daily.  The incontinence has been present for the past 5-7 days.  He is using OTC AZO prn dysuria which provides only minimal relief.  He has not tried Oxybutynin or other anticholinergics.  I advised the patient that prior to prescribing and anticholinergic, I would prefer that he see his Urologist for bladder scan PVR to r/o overflow incontinence and he is in agreement.  He has an appointment scheduled for 01/10/17 with Dr. [name].  I called [facility] this morning and they very kindly moved his appointment up to 01/04/17 at 9:45am with Dr. [name].  I have contacted the patient and he is aware of the new appointment date/time.  He states his understanding and compliance.  He will continue to use AZO prn dysuria in the meantime.  I will fax over recent urine culture report and most recent office visit. I greatly appreciate Urology's willingness to accommodate this patient for acute evaluation of new onset urinary incontinence to r/o AUR with overflow incontinence.  -Ethridge Sollenberger

## 2017-01-04 ENCOUNTER — Ambulatory Visit: Payer: Self-pay | Admitting: Urology

## 2017-01-10 ENCOUNTER — Ambulatory Visit
Admission: RE | Admit: 2017-01-10 | Discharge: 2017-01-10 | Disposition: A | Discharge status: Home / Self Care | Payer: Medicare HMO | Source: Ambulatory Visit | Attending: Urology | Admitting: Urology

## 2017-01-10 DIAGNOSIS — E43 Unspecified severe protein-calorie malnutrition: Secondary | ICD-10-CM | POA: Insufficient documentation

## 2017-01-10 DIAGNOSIS — Z85818 Personal history of malignant neoplasm of other sites of lip, oral cavity, and pharynx: Secondary | ICD-10-CM | POA: Insufficient documentation

## 2017-01-10 DIAGNOSIS — Z510 Encounter for antineoplastic radiation therapy: Secondary | ICD-10-CM | POA: Insufficient documentation

## 2017-01-10 DIAGNOSIS — C61 Malignant neoplasm of prostate: Secondary | ICD-10-CM | POA: Insufficient documentation

## 2017-01-11 NOTE — Progress Notes (Signed)
Savir A. 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     Pain: He  currently is having left ear ache for about a week.    URINARY: He denies  urinary frequency,urinary urgency, hematuria or dysuria.  Reports he is emptying his bladder. Pt states he gets up to urinate 2 times per night. BOWEL: He reports a  bowel movement everyday, normal bowel movements.  Denies diarrhea. Fatigue:Denies fatigue. Appetite:Good eating three meals per day,drinking ensure three times a day. Weight: Wt Readings from Last 3 Encounters:  01/17/17 104 lb 6.4 oz (47.4 kg)  10/28/16 111 lb 3.2 oz (50.4 kg)  10/27/16 112 lb (50.8 kg)  BP 127/84   Pulse 92   Temp 98.3 F (36.8 C) (Oral)   Resp 18   Ht 5\' 4"  (1.626 m)   Wt 104 lb 6.4 oz (47.4 kg)   SpO2 100%   BMI 17.92 kg/m

## 2017-01-12 ENCOUNTER — Telehealth: Payer: Self-pay

## 2017-01-12 NOTE — Telephone Encounter (Signed)
Patient is on the list for [employer] [facility] and may be a good candidate for an AWV. Please let me know if/when appt is scheduled.   

## 2017-01-16 NOTE — Telephone Encounter (Signed)
Called and made an appointment for 02/27/17

## 2017-01-16 NOTE — Telephone Encounter (Signed)
Called and scheduled an AWV with the patient for 02/27/17.

## 2017-01-17 ENCOUNTER — Encounter: Payer: Self-pay | Admitting: *Deleted

## 2017-01-17 ENCOUNTER — Ambulatory Visit
Admission: RE | Admit: 2017-01-17 | Discharge: 2017-01-17 | Disposition: A | Discharge status: Home / Self Care | Payer: Medicare HMO | Source: Ambulatory Visit | Attending: Urology | Admitting: Urology

## 2017-01-17 ENCOUNTER — Encounter: Payer: Self-pay | Admitting: Urology

## 2017-01-17 VITALS — BP 127/84 | HR 92 | Temp 98.3°F | Resp 18 | Ht 64.0 in | Wt 104.4 lb

## 2017-01-17 DIAGNOSIS — C139 Malignant neoplasm of hypopharynx, unspecified: Secondary | ICD-10-CM

## 2017-01-17 DIAGNOSIS — Z85818 Personal history of malignant neoplasm of other sites of lip, oral cavity, and pharynx: Secondary | ICD-10-CM | POA: Diagnosis not present

## 2017-01-17 DIAGNOSIS — E43 Unspecified severe protein-calorie malnutrition: Secondary | ICD-10-CM | POA: Diagnosis not present

## 2017-01-17 DIAGNOSIS — C61 Malignant neoplasm of prostate: Secondary | ICD-10-CM

## 2017-01-17 DIAGNOSIS — Z510 Encounter for antineoplastic radiation therapy: Secondary | ICD-10-CM | POA: Diagnosis not present

## 2017-01-17 NOTE — Addendum Note (Signed)
Encounter addended by: Malena Edman, RN on: 01/17/2017  4:06 PM<BR>    Actions taken: Charge Capture section accepted, Sign clinical note

## 2017-01-17 NOTE — Addendum Note (Signed)
Encounter addended by: Malena Edman, RN on: 01/17/2017  4:03 PM<BR>    Actions taken: Charge Capture section accepted

## 2017-01-17 NOTE — Progress Notes (Signed)
1330 Called to remind about appointment he plans to be here for his appointment before 1430.

## 2017-01-17 NOTE — Progress Notes (Signed)
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 The neck was treated to 44 Gy in 22 fractions at 2 Gy per fraction. 2. The neck was boosted to 22 Gy in 11 fractions at 2 Gy per fraction.  Narrative:  The patient returns today for routine follow-up s/p completion of IMRT for prostate cancer.  He tolerated his treatments well but did experience some dysuria, nocturia 3-4x/night, urgency with hesitancy and weak stream, modest fatigue and incontinence.  He was treated for a pan-sensitive EColi UTI in 11/2016 and symptoms improved with treatment but never completely resolved.  Repeat Urine C&S on 12/28/16 was negative for infection.  The patient has continued using AZO for dysuria and was recently started on Myrbetriq  25mg  po qd which has helped significantly with improvement in his LUTS.                           On review of systems, the patient states that he is tolerating the LUTS at this point, using Myrbetriq daily.  He is no longer requiring AZO as the dysuria is only very mild at this point.  He feels that he is better able to empty his bladder on voiding since starting Myrbetriq and has significantly improved frequency, urgency and nocturia.  His symptoms are very tolerable at present.  He denies abdominal pain, N/V/D or constipation. His fiance' recently passed and this has taken a toll on him emotionally.  He admits to resuming heavy alcohol consumption for 1-2 weeks  following her death but has since cut back significantly. He feels that his emotional lability is gradually improving. He is tearful in conversation today. He denies any thoughts of self harm or harming others.  He reports no difficulty with dysphagia at this point.  ALLERGIES:  is allergic to penicillins.  Meds: Current Outpatient Prescriptions  Medication Sig Dispense Refill  . allopurinol (ZYLOPRIM) 100 MG tablet Take 1 tablet (100 mg total) by mouth daily. 30 tablet 2  . amLODipine (NORVASC) 10 MG tablet TAKE 1 TABLET(10 MG) BY MOUTH DAILY 90 tablet 1  . amLODipine (NORVASC) 10 MG tablet TAKE 1 TABLET(10 MG) BY MOUTH DAILY 30 tablet 0  . aspirin 81 MG chewable tablet Chew 81 mg by mouth daily.    . cyclobenzaprine (FLEXERIL) 5 MG tablet Take 1 tablet (5 mg total) by mouth 3 (three) times daily as needed for muscle spasms. 60 tablet 1  . feeding supplement, ENSURE ENLIVE, (ENSURE ENLIVE) LIQD Take 237 mLs by mouth 3 (three) times daily between meals. 237 mL 12  . Multiple Vitamin (MULTIVITAMIN) tablet Take 1 tablet by mouth daily.    . potassium chloride (K-DUR) 10 MEQ tablet Take 1 tablet (10 mEq total) by mouth daily. 4 tablet 0  . propranolol (INDERAL) 10 MG tablet TAKE 1 TABLET(10 MG) BY MOUTH TWICE DAILY 180 tablet  1  . traZODone (DESYREL) 50 MG tablet Take 0.5-1 tablets (25-50 mg total) by mouth at bedtime as needed for sleep. 30 tablet 3  . albuterol (PROVENTIL) (2.5 MG/3ML) 0.083% nebulizer solution Take 3 mLs (2.5 mg total) by nebulization every 6 (six) hours as needed for wheezing or shortness of breath. (Patient not taking: Reported on 01/17/2017) 75 mL 12  . oxyCODONE (OXYCONTIN) 20 mg 12 hr tablet Take 1 tablet (20 mg total) by mouth every 12 (twelve) hours. (Patient not taking: Reported on 01/17/2017) 60 tablet 0  . sulfamethoxazole-trimethoprim (BACTRIM DS,SEPTRA DS) 800-160 MG tablet Take 1 tablet by mouth 2 (two) times daily. (Patient not taking: Reported on 01/17/2017) 10 tablet  0   No current facility-administered medications for this encounter.     Physical Findings:  height is 5\' 4"  (1.626 m) and weight is 104 lb 6.4 oz (47.4 kg). His oral temperature is 98.3 F (36.8 C). His blood pressure is 127/84 and his pulse is 92. His respiration is 18 and oxygen saturation is 100%.  Pain Assessment Pain Score: 4  (Left ear ache)/10 In general this is a well appearing african [place] male in no acute distress. He's alert and oriented x4 and appropriate throughout the examination. Cardiopulmonary assessment is negative for acute distress and he exhibits normal effort.   Lab Findings: Lab Results  Component Value Date   WBC 9.5 03/19/2016   HGB 10.5 (L) 03/19/2016   HCT 32.5 (L) 03/19/2016   MCV 95.3 03/19/2016   PLT 127 (L) 03/19/2016     Radiographic Findings: No results found.  Impression/Plan: 1.  Stage T1c N0 M0 adenocarcinoma of the prostate with a Gleason's Score of 3+4 and a PSA of 7.61.   He will continue to follow up with urology for ongoing PSA determinations and has an appointment scheduled with Dr. [name] in 07/2017. He understands what to expect with regards to PSA monitoring going forward. I will look forward to following his response to correspondence with urology, and would be happy to continue to participate in his care if clinically indicated. I talked to the patient about what to expect in the future, including his risk for erectile dysfunction rectal bleeding. I encouraged him to call or return to the office if he has any questions regarding his previous radiation or possible radiation side effects. He was comfortable with this plan and will follow up as needed.   2.  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  He completed radiotherapy to the hypopharynx on 06/21/16.  We will arrange for a follow up PET scan for disease restaging within the next 2 weeks and I will plan to call him with those results.  Pending results are  stable, will plan a follow up visit in 3 months.     Nicholos Johns, PA-C

## 2017-01-18 ENCOUNTER — Telehealth: Payer: Self-pay | Admitting: Radiation Oncology

## 2017-01-18 NOTE — Telephone Encounter (Signed)
Placed completed REQUEST FOR INDEPENDENT ASSESSMENT FOR PCA and placed in Adam Marshall's box to to sign.

## 2017-01-20 ENCOUNTER — Encounter: Payer: Self-pay | Admitting: Radiation Oncology

## 2017-01-20 NOTE — Progress Notes (Signed)
Paperwork ([employer]) received from doctor, faxed to [phone], confirmation received, patient advised to mail copy, complete 01/20/17

## 2017-02-01 ENCOUNTER — Other Ambulatory Visit: Payer: Self-pay | Admitting: Urology

## 2017-02-01 ENCOUNTER — Telehealth: Payer: Self-pay | Admitting: *Deleted

## 2017-02-01 DIAGNOSIS — C139 Malignant neoplasm of hypopharynx, unspecified: Secondary | ICD-10-CM

## 2017-02-01 NOTE — Telephone Encounter (Signed)
CALLED PATIENT TO ASK ABOUT COMING IN FOR LABS TOMORROW ON 02-02-17, PT. AGREED TO COME IN @ 10:15 AM

## 2017-02-02 ENCOUNTER — Ambulatory Visit
Admission: RE | Admit: 2017-02-02 | Discharge: 2017-02-02 | Disposition: A | Discharge status: Home / Self Care | Payer: Medicare HMO | Source: Ambulatory Visit | Attending: Urology | Admitting: Urology

## 2017-02-02 DIAGNOSIS — Z510 Encounter for antineoplastic radiation therapy: Secondary | ICD-10-CM | POA: Diagnosis not present

## 2017-02-02 DIAGNOSIS — C139 Malignant neoplasm of hypopharynx, unspecified: Secondary | ICD-10-CM

## 2017-02-02 LAB — TSH: TSH: 4.482 m(IU)/L — ABNORMAL HIGH (ref 0.320–4.118)

## 2017-02-03 ENCOUNTER — Telehealth: Payer: Self-pay | Admitting: Urology

## 2017-02-03 ENCOUNTER — Telehealth: Payer: Self-pay | Admitting: *Deleted

## 2017-02-03 NOTE — Telephone Encounter (Signed)
CALLED PATIENT TO INFORM OF PET SCAN FOR 02-13-17 - ARRIVAL TIME - 1:30 PM , PT. TO BE NPO - 6 HRS. PRIOR TO TEST, TEST TO BE DONE @ [facility], SPOKE WITH PATIENT AND HE IS AWARE OF THIS TEST

## 2017-02-03 NOTE — Telephone Encounter (Signed)
I attempted to reach this patient to discuss his elevated TSH on recent lab test following his radiotherapy to the hypopharynx for treatment of SCC of the pyriform sinus.  I will continue to try and reach patient.  Unfortunately, the only number that we have listed to reach him does not have an answering machine to leave a message.  I have forwarded a copy of his lab report to his PCP, Terri Piedra, NP and an inbox message indicating that the patient will need further evaluation and management of this new finding. I will continue to attempt to reach the patient to encourage that he contact his PCP to schedule a visit for further evaluation and management.

## 2017-02-04 ENCOUNTER — Other Ambulatory Visit: Payer: Self-pay | Admitting: Family

## 2017-02-10 ENCOUNTER — Telehealth: Payer: Self-pay | Admitting: Urology

## 2017-02-10 NOTE — Telephone Encounter (Signed)
I spoke with the patient by phone today to inform of abnormal TSH on recent labs.  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  This will be further evaluated and managed by his PCP.  His has an appointment with Terri Piedra, NP on 02/27/17.  I have sent Marya Amsler an inbox message with this information and requested that he investigate this further.  Patient states his understanding and agreement with this plan.

## 2017-02-13 ENCOUNTER — Encounter (HOSPITAL_COMMUNITY)
Admission: RE | Admit: 2017-02-13 | Discharge: 2017-02-13 | Disposition: A | Discharge status: Home / Self Care | Payer: Medicare HMO | Source: Ambulatory Visit | Attending: Urology | Admitting: Urology

## 2017-02-13 DIAGNOSIS — C61 Malignant neoplasm of prostate: Secondary | ICD-10-CM

## 2017-02-13 DIAGNOSIS — C139 Malignant neoplasm of hypopharynx, unspecified: Secondary | ICD-10-CM | POA: Diagnosis present

## 2017-02-13 LAB — GLUCOSE, CAPILLARY: GLUCOSE-CAPILLARY: 102 mg/dL — AB (ref 65–99)

## 2017-02-13 MED ORDER — FLUDEOXYGLUCOSE F - 18 (FDG) INJECTION
5.6200 | Freq: Once | INTRAVENOUS | Status: AC | PRN
  Administered 2017-02-13: 5.62 via INTRAVENOUS

## 2017-02-17 ENCOUNTER — Telehealth: Payer: Self-pay | Admitting: Radiation Oncology

## 2017-02-17 NOTE — Telephone Encounter (Signed)
Patient left a voicemail requesting a return call. Phoned patient back. Patient inquiring about next steps in care. Per [employer], PA-C explained to the patient his PET shows an abnormality. Explained Ashlyn will speak with Dr. [name] when he returns this coming Wednesday and devise a plan of care. Explained that [place] or myself would be in touch with a plan. Patient verbalized understanding. Patient wants this RN to "remind" Ashlyn that his left ears continues to ache with occasional tinnitus. Patient confirms he had this complaints three weeks ago at his follow up with Ashlyn.

## 2017-02-21 ENCOUNTER — Other Ambulatory Visit: Payer: Self-pay | Admitting: Urology

## 2017-02-21 DIAGNOSIS — C139 Malignant neoplasm of hypopharynx, unspecified: Secondary | ICD-10-CM

## 2017-02-23 ENCOUNTER — Other Ambulatory Visit: Payer: Self-pay | Admitting: Family

## 2017-02-24 NOTE — Progress Notes (Deleted)
Subjective:   Adam Marshall is a 56 y.o. male who presents for Medicare Annual/Subsequent preventive examination.  Review of Systems:  No ROS.  Medicare Wellness Visit. Additional risk factors are reflected in the social history.    Sleep patterns: {SX; SLEEP PATTERNS:18802::"feels rested on waking","does not get up to void","gets up *** times nightly to void","sleeps *** hours nightly"}.   Home Safety/Smoke Alarms: Feels safe in home. Smoke alarms in place.  Living environment; residence and Firearm Safety: {Rehab home environment / accessibility:30080::"no firearms","firearms stored safely"}. Seat Belt Safety/Bike Helmet: Wears seat belt.   Counseling:   Eye Exam-  Dental-  Male:   CCS- N/D  PSA-  Lab Results  Component Value Date   PSA 7.61 (H) 10/13/2015       Objective:    Vitals: There were no vitals taken for this visit.  There is no height or weight on file to calculate BMI.  Tobacco History  Smoking Status  . Former Smoker  . Packs/day: 0.30  . Years: 30.00  . Types: Cigarettes  . Quit date: 03/19/2016  Smokeless Tobacco  . Never Used     Counseling given: Not Answered   Past Medical History:  Diagnosis Date  . Abnormal LFTs   . Cancer ([place])    invasive squamous cell carcinoma of pyriform sinus  . Chronic back pain   . Chronic knee pain    left  . Cirrhosis ([place])   . Fatty liver   . Gout   . Hiatal hernia   . Hypertension   . Pancreatic lesion   . Pancreatitis   . Prostate cancer [facility])    Past Surgical History:  Procedure Laterality Date  . ESOPHAGOGASTRODUODENOSCOPY N/A 03/15/2016   Procedure: ESOPHAGOGASTRODUODENOSCOPY (EGD);  Surgeon: Doran Stabler, MD;  Location: [facility];  Service: Endoscopy;  Laterality: N/A;  . KNEE SURGERY     left  . PANENDOSCOPY N/A 03/18/2016   Procedure: DIRECT LARYNGOSCOPY, WITH BRONCHOSCOPY, AND ESOPHAGOSCOPY, WITH BIOPSY;  Surgeon: [name], MD;  Location: [facility];  Service: ENT;   Laterality: N/A;  . PROSTATE BIOPSY     Family History  Problem Relation Age of Onset  . Diabetes Mother   . Diabetes Maternal Grandmother   . Throat cancer Brother 72  . Kidney disease Sister        Dialysis   History  Sexual Activity  . Sexual activity: Yes    Outpatient Encounter Prescriptions as of 02/27/2017  Medication Sig  . albuterol (PROVENTIL) (2.5 MG/3ML) 0.083% nebulizer solution Take 3 mLs (2.5 mg total) by nebulization every 6 (six) hours as needed for wheezing or shortness of breath. (Patient not taking: Reported on 01/17/2017)  . allopurinol (ZYLOPRIM) 100 MG tablet Take 1 tablet (100 mg total) by mouth daily.  Marland Kitchen amLODipine (NORVASC) 10 MG tablet TAKE 1 TABLET(10 MG) BY MOUTH DAILY  . amLODipine (NORVASC) 10 MG tablet TAKE 1 TABLET(10 MG) BY MOUTH DAILY  . aspirin 81 MG chewable tablet Chew 81 mg by mouth daily.  . cyclobenzaprine (FLEXERIL) 5 MG tablet TAKE 1 TABLET(5 MG) BY MOUTH THREE TIMES DAILY AS NEEDED FOR MUSCLE SPASMS  . feeding supplement, ENSURE ENLIVE, (ENSURE ENLIVE) LIQD Take 237 mLs by mouth 3 (three) times daily between meals.  . Multiple Vitamin (MULTIVITAMIN) tablet Take 1 tablet by mouth daily.  Marland Kitchen oxyCODONE (OXYCONTIN) 20 mg 12 hr tablet Take 1 tablet (20 mg total) by mouth every 12 (twelve) hours. (Patient not taking: Reported on 01/17/2017)  .  potassium chloride (K-DUR) 10 MEQ tablet Take 1 tablet (10 mEq total) by mouth daily.  . propranolol (INDERAL) 10 MG tablet TAKE 1 TABLET(10 MG) BY MOUTH TWICE DAILY  . propranolol (INDERAL) 10 MG tablet Take 1 tablet (10 mg total) by mouth 2 (two) times daily. Overdue for annual appt w/labs must see provider for future refills  . sulfamethoxazole-trimethoprim (BACTRIM DS,SEPTRA DS) 800-160 MG tablet Take 1 tablet by mouth 2 (two) times daily. (Patient not taking: Reported on 01/17/2017)  . traZODone (DESYREL) 50 MG tablet Take 0.5-1 tablets (25-50 mg total) by mouth at bedtime as needed for sleep.   No  facility-administered encounter medications on file as of 02/27/2017.     Activities of Daily Living No flowsheet data found.  Patient Care Team: [name], FNP as PCP - General (Family Medicine) [name], MD as Consulting Physician (Radiation Oncology) Hayden Pedro, PA-C as Physician Assistant (Oncology)   Assessment:    Physical assessment deferred to PCP.  Exercise Activities and Dietary recommendations   Diet (meal preparation, eat out, water intake, caffeinated beverages, dairy products, fruits and vegetables): {Desc; diets:16563} Breakfast: Lunch:  Dinner:      Goals    None     Fall Risk Fall Risk  01/17/2017 07/06/2016 03/30/2016  Falls in the past year? No Yes Yes  Number falls in past yr: - 1 1  Injury with Fall? - No No  Follow up - - Education provided   Depression Screen PHQ 2/9 Scores 01/17/2017 07/06/2016 06/02/2016 03/30/2016  PHQ - 2 Score 0 0 0 0    Cognitive Function        Immunization History  Administered Date(s) Administered  . Influenza Split 10/20/2010   Screening Tests Health Maintenance  Topic Date Due  . INFLUENZA VACCINE  04/05/2017  . TETANUS/TDAP  10/20/2018  . COLONOSCOPY  10/21/2019  . Hepatitis C Screening  Completed  . HIV Screening  Completed      Plan:     I have personally reviewed and noted the following in the patient's chart:   . Medical and social history . Use of alcohol, tobacco or illicit drugs  . Current medications and supplements . Functional ability and status . Nutritional status . Physical activity . Advanced directives . List of other physicians . Vitals . Screenings to include cognitive, depression, and falls . Referrals and appointments  In addition, I have reviewed and discussed with patient certain preventive protocols, quality metrics, and best practice recommendations. A written personalized care plan for preventive services as well as general preventive health  recommendations were provided to patient.     Michiel Cowboy, RN  02/24/2017

## 2017-02-24 NOTE — Progress Notes (Unsigned)
Pre visit review using our clinic review tool, if applicable. No additional management support is needed unless otherwise documented below in the visit note. 

## 2017-02-27 ENCOUNTER — Encounter: Payer: Self-pay | Admitting: Family

## 2017-02-27 ENCOUNTER — Ambulatory Visit (INDEPENDENT_AMBULATORY_CARE_PROVIDER_SITE_OTHER): Payer: Medicare HMO | Admitting: Family

## 2017-02-27 ENCOUNTER — Ambulatory Visit: Payer: Medicare HMO

## 2017-02-27 VITALS — BP 116/82 | HR 91 | Temp 98.6°F | Resp 20 | Ht 64.0 in | Wt 102.0 lb

## 2017-02-27 DIAGNOSIS — H9202 Otalgia, left ear: Secondary | ICD-10-CM | POA: Insufficient documentation

## 2017-02-27 DIAGNOSIS — Z0000 Encounter for general adult medical examination without abnormal findings: Secondary | ICD-10-CM | POA: Insufficient documentation

## 2017-02-27 DIAGNOSIS — Z0001 Encounter for general adult medical examination with abnormal findings: Secondary | ICD-10-CM | POA: Diagnosis not present

## 2017-02-27 MED ORDER — NEOMYCIN-POLYMYXIN-HC 1 % OT SOLN: 3.0000 [drp] | Freq: Four times a day (QID) | OTIC | 0 refills | Status: DC

## 2017-02-27 NOTE — Patient Instructions (Addendum)
Thank you for choosing [facility].  SUMMARY AND INSTRUCTIONS:  It appears you may have a small little lesion.   We will treat with antibiotic drops.  Follow up if symptoms worsen or do not improve.   Medication:  Your prescription(s) have been submitted to your pharmacy or been printed and provided for you. Please take as directed and contact our office if you believe you are having problem(s) with the medication(s) or have any questions.  Follow up:  If your symptoms worsen or fail to improve, please contact our office for further instruction, or in case of emergency go directly to the emergency room at the closest medical facility.   Continue to eat heart healthy diet (full of fruits, vegetables, whole grains, lean protein, water--limit salt, fat, and sugar intake), supplement with high protein ensure, eat healthy high calorie foods to help gain weight and increase physical activity as tolerated.   Mr. Seifer , Thank you for taking time to come for your Medicare Wellness Visit. I appreciate your ongoing commitment to your health goals. Please review the following plan we discussed and let me know if I can assist you in the future.   These are the goals we discussed: Goals    . gain 15 pounds          Eat healthy and frequently, choose high protein foods.        This is a list of the screening recommended for you and due dates:  Health Maintenance  Topic Date Due  . Flu Shot  04/05/2017  . Tetanus Vaccine  10/20/2018  . Colon Cancer Screening  10/21/2019  .  Hepatitis C: One time screening is recommended by Center for Disease Control  (CDC) for  adults born from [birth year] through 1966.   Completed  . HIV Screening  Completed

## 2017-02-27 NOTE — Assessment & Plan Note (Signed)
Reviewed and updated patient's medical, surgical, family and social history. Medications and allergies were also reviewed. Basic screenings for depression, activities of daily living, hearing, cognition and safety were performed. Provider list was updated and health plan was provided to the patient.  

## 2017-02-27 NOTE — Patient Instructions (Addendum)
[name] in [place], [place] Address: [address] [place], [place], [place] [address] Hours: Open ? Closes 5PM Phone: [phone]  Continue to eat heart healthy diet (full of fruits, vegetables, whole grains, lean protein, water--limit salt, fat, and sugar intake) and increase physical activity as tolerated.   Adam Marshall , Thank you for taking time to come for your Medicare Wellness Visit. I appreciate your ongoing commitment to your health goals. Please review the following plan we discussed and let me know if I can assist you in the future.   These are the goals we discussed: Goals    . gain 15 pounds          Eat healthy and frequently, choose high protein foods.        This is a list of the screening recommended for you and due dates:  Health Maintenance  Topic Date Due  . Flu Shot  04/05/2017  . Tetanus Vaccine  10/20/2018  . Colon Cancer Screening  10/21/2019  .  Hepatitis C: One time screening is recommended by Center for Disease Control  (CDC) for  adults born from [birth year] through 1966.   Completed  . HIV Screening  Completed

## 2017-02-27 NOTE — Progress Notes (Signed)
Pre visit review using our clinic review tool, if applicable. No additional management support is needed unless otherwise documented below in the visit note. 

## 2017-02-27 NOTE — Progress Notes (Signed)
Subjective:    Patient ID: Adam Marshall, male    DOB: 04-Feb-1961, 56 y.o.   MRN: [id number]  Chief Complaint  Patient presents with  . Medicare Wellness    HPI:  Adam Marshall is a 56 y.o. male who  has a past medical history of Abnormal LFTs; Cancer ([place]); Chronic back pain; Chronic knee pain; Cirrhosis ([place]); Fatty liver; Gout; Hiatal hernia; Hypertension; Pancreatic lesion; Pancreatitis; and Prostate cancer ([place]). and presents today for an acute office visit.   This is a new problem. Associated symptom of pain located in his left ear has been going on for about 2 months. He noted some blood coming from ear that stopped readily. Modifying factors include cleaning his ear with peroxide which has helped a little. No other discharge, tinnitus or change of hearing. Course of the symptoms generally waxes and wanes.   Allergies  Allergen Reactions  . Penicillins Hives and Rash    Has patient had a PCN reaction causing immediate rash, facial/tongue/throat swelling, SOB or lightheadedness with hypotension:Yes Has patient had a PCN reaction causing severe rash involving mucus membranes or skin necrosis:unsure Has patient had a PCN reaction that required hospitalization:No Has patient had a PCN reaction occurring within the last 10 years:No  If all of the above answers are "NO", then may proceed with Cephalosporin use. Has patient had a PCN reaction causing immediate rash, facial/tongue/throat swelling, SOB or lightheadedness with hypotension:Yes Has patient had a PCN reaction causing severe rash involving mucus membranes or skin necrosis:unsure Has patient had a PCN reaction that required hospitalization:No Has patient had a PCN reaction occurring within the last 10 years:No  If all of the above answers are "NO", then may proceed with Cephalosporin use.       Outpatient Medications Prior to Visit  Medication Sig Dispense Refill  . albuterol (PROVENTIL) (2.5 MG/3ML) 0.083%  nebulizer solution Take 3 mLs (2.5 mg total) by nebulization every 6 (six) hours as needed for wheezing or shortness of breath. 75 mL 12  . allopurinol (ZYLOPRIM) 100 MG tablet Take 1 tablet (100 mg total) by mouth daily. 30 tablet 2  . amLODipine (NORVASC) 10 MG tablet TAKE 1 TABLET(10 MG) BY MOUTH DAILY 90 tablet 1  . aspirin 81 MG chewable tablet Chew 81 mg by mouth daily.    . cyclobenzaprine (FLEXERIL) 5 MG tablet TAKE 1 TABLET(5 MG) BY MOUTH THREE TIMES DAILY AS NEEDED FOR MUSCLE SPASMS 60 tablet 0  . feeding supplement, ENSURE ENLIVE, (ENSURE ENLIVE) LIQD Take 237 mLs by mouth 3 (three) times daily between meals. 237 mL 12  . Multiple Vitamin (MULTIVITAMIN) tablet Take 1 tablet by mouth daily.    . potassium chloride (K-DUR) 10 MEQ tablet Take 1 tablet (10 mEq total) by mouth daily. 4 tablet 0  . propranolol (INDERAL) 10 MG tablet TAKE 1 TABLET(10 MG) BY MOUTH TWICE DAILY 180 tablet 1  . traZODone (DESYREL) 50 MG tablet Take 0.5-1 tablets (25-50 mg total) by mouth at bedtime as needed for sleep. 30 tablet 3   No facility-administered medications prior to visit.       Past Surgical History:  Procedure Laterality Date  . ESOPHAGOGASTRODUODENOSCOPY N/A 03/15/2016   Procedure: ESOPHAGOGASTRODUODENOSCOPY (EGD);  Surgeon: Doran Stabler, MD;  Location: [facility];  Service: Endoscopy;  Laterality: N/A;  . KNEE SURGERY     left  . PANENDOSCOPY N/A 03/18/2016   Procedure: DIRECT LARYNGOSCOPY, WITH BRONCHOSCOPY, AND ESOPHAGOSCOPY, WITH BIOPSY;  Surgeon: [name], MD;  Location: [facility];  Service: ENT;  Laterality: N/A;  . PROSTATE BIOPSY        Past Medical History:  Diagnosis Date  . Abnormal LFTs   . 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 Chronic back pain   . Chronic knee pain    left  . Cirrhosis ([place])   . Fatty liver   . Gout   . Hiatal hernia   . Hypertension   . Pancreatic lesion   . Pancreatitis   . Prostate cancer ([place])        Review of Systems  Constitutional: Negative for chills and fever.  HENT: Positive for ear discharge and ear pain. Negative for congestion, sinus pain, sinus pressure, sore throat and tinnitus.   Respiratory: Negative for chest tightness and shortness of breath.   Cardiovascular: Negative for chest pain, palpitations and leg swelling.  Neurological: Negative for headaches.      Objective:    BP 116/82   Pulse 91   Temp 98.6 F (37 C)   Resp 20   Ht 5\' 4"  (1.626 m)   Wt 102 lb (46.3 kg)   SpO2 99%   BMI 17.51 kg/m  Nursing note and vital signs reviewed.  Physical Exam  Constitutional: He is oriented to person, place, and time. He appears well-developed and well-nourished. No distress.  HENT:  Right Ear: Hearing, tympanic membrane, external ear and ear canal normal. No drainage, swelling or tenderness. No foreign bodies. No middle ear effusion. No decreased hearing is noted.  Left Ear: Hearing, tympanic membrane and ear canal normal. Left ear exhibits lacerations. No drainage, swelling or tenderness. No foreign bodies.  No middle ear effusion. No decreased hearing is noted.  Small lesion located in left ear appears consistent with a small wound.   Cardiovascular: Normal rate, regular rhythm, normal heart sounds and intact distal pulses.   Pulmonary/Chest: Effort normal and breath sounds normal.  Neurological: He is alert and oriented to person, place, and time.  Skin: Skin is warm and dry.  Psychiatric: He has a normal mood and affect. His behavior is normal. Judgment and thought content normal.       Assessment & Plan:   Problem List Items Addressed This Visit      Other   Ear pain, left    New onset left ear pain with small laceration/lesion located in the left ear near the outer aspect of the canal. Start Cortisoporin. Avoid Q-tips. No changes in healing. Continue to monitor and follow up if symptoms worsen or do not improve.       Encounter for Medicare annual  wellness exam - Primary    Reviewed and updated patient's medical, surgical, family and social history. Medications and allergies were also reviewed. Basic screenings for depression, activities of daily living, hearing, cognition and safety were performed. Provider list was updated and health plan was provided to the patient.           I am having Mr. Martire start on NEOMYCIN-POLYMYXIN-HYDROCORTISONE. I am also having him maintain his aspirin, albuterol, feeding supplement (ENSURE ENLIVE), allopurinol, multivitamin, traZODone, amLODipine, propranolol, potassium chloride, and cyclobenzaprine.   Meds ordered this encounter  Medications  . NEOMYCIN-POLYMYXIN-HYDROCORTISONE (CORTISPORIN) 1 % SOLN OTIC solution    Sig: Place 3 drops into the left ear 4 (four) times daily.    Dispense:  10 mL    Refill:  0    Order Specific Question:   Supervising Provider  Answer:   Hoyt Koch [[id number]]     Follow-up: Return in about 1 year (around 02/27/2018), or if symptoms worsen or fail to improve.  [name], FNP

## 2017-02-27 NOTE — Progress Notes (Signed)
Subjective:   Adam Marshall is a 56 y.o. male who presents for Medicare Annual/Subsequent preventive examination.  Patient states he finished radiation treatments for both sinus cancer and prostate cancer. He had a recent PET scan and is waiting to hear results from the physician. His significant other of 30 years passed away a month ago. Reports blood has drained from his left ear and it has been hurting for a few weeks. Patient misunderstood and thought he was going to see his PCP today and asked if this would be possible to arrange.  Review of Systems:  No ROS.  Medicare Wellness Visit. Additional risk factors are reflected in the social history.  Cardiac Risk Factors include: hypertension;male gender;advanced age (>7men, >28 women) Sleep patterns: has frequent nighttime awakenings, gets up 2-3 times nightly to void and sleeps 4-5 hours nightly.  Patient reports insomnia issues, discussed recommended sleep tips and stress reduction tips.  Home Safety/Smoke Alarms: Feels safe in home. Smoke alarms in place.  Living environment; residence and Firearm Safety: 1-story house/ trailer, no firearms. Lives alone, no needs for DME, reports family and friend support Seat Belt Safety/Bike Helmet: Wears seat belt.   Counseling:   Eye Exam- Last 2 years, resources provided Dental- Last several months had teeth extractions prior to Head and neck radiation, resources provided for routine exams  Male:   CCS- N/D, patient declined referral today    PSA-  Lab Results  Component Value Date   PSA 7.61 (H) 10/13/2015       Objective:    Vitals: BP 116/82   Pulse 91   Temp 98.6 F (37 C)   Resp 20   Ht 5\' 4"  (1.626 m)   Wt 102 lb (46.3 kg)   SpO2 99%   BMI 17.51 kg/m   Body mass index is 17.51 kg/m.  Tobacco History  Smoking Status  . Former Smoker  . Packs/day: 0.30  . Years: 30.00  . Types: Cigarettes  . Quit date: 03/19/2016  Smokeless Tobacco  . Never Used       Counseling given: Not Answered   Past Medical History:  Diagnosis Date  . Abnormal LFTs   . 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 Chronic back pain   . Chronic knee pain    left  . Cirrhosis ([place])   . Fatty liver   . Gout   . Hiatal hernia   . Hypertension   . Pancreatic lesion   . Pancreatitis   . Prostate cancer [facility])    Past Surgical History:  Procedure Laterality Date  . ESOPHAGOGASTRODUODENOSCOPY N/A 03/15/2016   Procedure: ESOPHAGOGASTRODUODENOSCOPY (EGD);  Surgeon: Doran Stabler, MD;  Location: [facility];  Service: Endoscopy;  Laterality: N/A;  . KNEE SURGERY     left  . PANENDOSCOPY N/A 03/18/2016   Procedure: DIRECT LARYNGOSCOPY, WITH BRONCHOSCOPY, AND ESOPHAGOSCOPY, WITH BIOPSY;  Surgeon: [name], MD;  Location: [facility];  Service: ENT;  Laterality: N/A;  . PROSTATE BIOPSY     Family History  Problem Relation Age of Onset  . Diabetes Mother   . Diabetes Maternal Grandmother   . Throat cancer Brother 45  . Kidney disease Sister        Dialysis   History  Sexual Activity  . Sexual activity: Not Currently    Outpatient Encounter Prescriptions as of 02/27/2017  Medication Sig  . albuterol (PROVENTIL) (2.5 MG/3ML) 0.083% nebulizer solution Take 3 mLs (2.5  mg total) by nebulization every 6 (six) hours as needed for wheezing or shortness of breath.  . allopurinol (ZYLOPRIM) 100 MG tablet Take 1 tablet (100 mg total) by mouth daily.  Marland Kitchen amLODipine (NORVASC) 10 MG tablet TAKE 1 TABLET(10 MG) BY MOUTH DAILY  . aspirin 81 MG chewable tablet Chew 81 mg by mouth daily.  . cyclobenzaprine (FLEXERIL) 5 MG tablet TAKE 1 TABLET(5 MG) BY MOUTH THREE TIMES DAILY AS NEEDED FOR MUSCLE SPASMS  . feeding supplement, ENSURE ENLIVE, (ENSURE ENLIVE) LIQD Take 237 mLs by mouth 3 (three) times daily between meals.  . Multiple Vitamin (MULTIVITAMIN) tablet Take 1 tablet by mouth daily.  . NEOMYCIN-POLYMYXIN-HYDROCORTISONE (CORTISPORIN) 1 %  SOLN OTIC solution Place 3 drops into the left ear 4 (four) times daily.  . potassium chloride (K-DUR) 10 MEQ tablet Take 1 tablet (10 mEq total) by mouth daily.  . propranolol (INDERAL) 10 MG tablet TAKE 1 TABLET(10 MG) BY MOUTH TWICE DAILY  . traZODone (DESYREL) 50 MG tablet Take 0.5-1 tablets (25-50 mg total) by mouth at bedtime as needed for sleep.   No facility-administered encounter medications on file as of 02/27/2017.     Activities of Daily Living In your present state of health, do you have any difficulty performing the following activities: 02/27/2017 02/27/2017  Hearing? N N  Vision? N N  Difficulty concentrating or making decisions? N N  Walking or climbing stairs? N N  Dressing or bathing? N N  Doing errands, shopping? N N  Preparing Food and eating ? N N  Using the Toilet? N N  In the past six months, have you accidently leaked urine? N Y  Do you have problems with loss of bowel control? N N  Managing your Medications? - N  Managing your Finances? N N  Housekeeping or managing your Housekeeping? N N  Some recent data might be hidden    Patient Care Team: [name], FNP as PCP - General (Family Medicine) [name], MD as Consulting Physician (Radiation Oncology) Hayden Pedro, PA-C as Physician Assistant (Oncology)   Assessment:    Physical assessment deferred to PCP.  Exercise Activities and Dietary recommendations Current Exercise Habits: Home exercise routine, Type of exercise: walking, Time (Minutes): 35, Frequency (Times/Week): 2, Weekly Exercise (Minutes/Week): 70, Intensity: Mild, Exercise limited by: None identified  Diet (meal preparation, eat out, water intake, caffeinated beverages, dairy products, fruits and vegetables): in general, a "healthy" diet  , well balanced Patient reports that he now able to eat and swallow after radiation treatment. He is trying to gain weight back and is supplementing with ensure.   Reviewed healthy  foods to gain weight and provided handouts, provided ensure supplements and coupons, encouraged patient to increase daily water intake.  Goals    . gain 15 pounds          Eat healthy and frequently, choose high protein foods.       Fall Risk Fall Risk  02/27/2017 02/27/2017 02/27/2017 01/17/2017 07/06/2016  Falls in the past year? Yes Yes No No Yes  Number falls in past yr: 1 1 - - 1  Injury with Fall? No No - - No  Follow up - Falls prevention discussed - - -   Depression Screen PHQ 2/9 Scores 02/27/2017 02/27/2017 01/17/2017 07/06/2016  PHQ - 2 Score 2 2 0 0  PHQ- 9 Score 3 6 - -    Cognitive Function       Ad8 score reviewed for issues:  Issues making decisions: no  Less interest in hobbies / activities: no  Repeats questions, stories (family complaining): no  Trouble using ordinary gadgets (microwave, computer, phone):no  Forgets the month or year: no  Mismanaging finances: no  Remembering appts: no  Daily problems with thinking and/or memory: no Ad8 score is= 0  Immunization History  Administered Date(s) Administered  . Influenza Split 10/20/2010   Screening Tests Health Maintenance  Topic Date Due  . INFLUENZA VACCINE  04/05/2017  . TETANUS/TDAP  10/20/2018  . COLONOSCOPY  10/21/2019  . Hepatitis C Screening  Completed  . HIV Screening  Completed      Plan:     Patient has an appointment today at 1530 to see PCP to evaluate left ear and discuss other issues.  Continue to eat heart healthy diet (full of fruits, vegetables, whole grains, lean protein, water--limit salt, fat, and sugar intake), supplement with high protein ensure, eat healthy high calorie foods to help gain weight and increase physical activity as tolerated.  I have personally reviewed and noted the following in the patient's chart:   . Medical and social history . Use of alcohol, tobacco or illicit drugs  . Current medications and supplements . Functional ability and  status . Nutritional status . Physical activity . Advanced directives . List of other physicians . Hospitalizations, surgeries, and ER visits in previous 12 months . Vitals . Screenings to include cognitive, depression, and falls . Referrals and appointments  In addition, I have reviewed and discussed with patient certain preventive protocols, quality metrics, and best practice recommendations. A written personalized care plan for preventive services as well as general preventive health recommendations were provided to patient.     Michiel Cowboy, RN  02/27/2017   Medical screening examination/treatment/procedure(s) were performed by non-physician practitioner and as supervising provider I was immediately available for consultation/collaboration. I agree with treatment plan. [name], FNP

## 2017-02-27 NOTE — Assessment & Plan Note (Signed)
New onset left ear pain with small laceration/lesion located in the left ear near the outer aspect of the canal. Start Cortisoporin. Avoid Q-tips. No changes in healing. Continue to monitor and follow up if symptoms worsen or do not improve.

## 2017-03-03 ENCOUNTER — Telehealth: Payer: Self-pay | Admitting: *Deleted

## 2017-03-03 NOTE — Telephone Encounter (Signed)
Called patient to inform of appt. with Dr. [name] on 0-2-63- arrival time - 9 am, spoke with patient and he is aware of this appt.

## 2017-03-03 NOTE — Telephone Encounter (Signed)
CALLED PATIENT TO INFORM OF APPT. WITH DR. [name]  ON 5-83-09 - ARRIVAL TIME - 1 PM, SPOKE WITH PATIENT AND HE IS AWARE OF THIS APPT.

## 2017-03-13 ENCOUNTER — Other Ambulatory Visit: Payer: Self-pay | Admitting: Family

## 2017-03-31 ENCOUNTER — Other Ambulatory Visit: Payer: Self-pay | Admitting: Family

## 2017-04-13 ENCOUNTER — Telehealth: Payer: Self-pay | Admitting: Radiation Oncology

## 2017-04-14 ENCOUNTER — Telehealth: Payer: Self-pay | Admitting: Family

## 2017-04-14 NOTE — Telephone Encounter (Signed)
Opened in error

## 2017-04-14 NOTE — Telephone Encounter (Signed)
Patient phoned requesting assistance. Patient explains he was seen by Dr. [name] and told he needed an appointment at [facility] but, hasn't heard back from anyone in [facility] office. Phoned [facility]'s office and spoke with Amy. Amy reports they have been attempting to reach [place] at [place] but, haven't received a response. I told Amy the patient is complaining of ear pain, sores in his ear and tinnitus. I requested an appointment for the patient with Dr. [name]. Amy committed to speaking with Dr. [name] then, calling the patient with next steps. Phoned patient back and explained this. Patient will keep his cell phone close by and wait on a call from Amy.

## 2017-04-18 MED ORDER — AMLODIPINE BESYLATE 10 MG PO TABS: ORAL_TABLET | ORAL | 1 refills | Status: AC

## 2017-04-18 NOTE — Addendum Note (Signed)
Addended by: Delice Bison E on: 04/18/2017 04:13 PM   Modules accepted: Orders

## 2017-04-18 NOTE — Telephone Encounter (Addendum)
Patient states he only has a few pills left of this medication.  Please follow up with patient in regard.   Patient can be reached at [phone].

## 2017-04-19 NOTE — Progress Notes (Signed)
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  Pain: He currently is having left ear pain and has sores in the ear with tinnitus on going for over two months.  He has an appointment with Dr. Mare Loan for evaluation.   URINARY: He denies reports  urinary frequency,urinary urgency, hematuria or dysuria. Pt states he gets up to urinate  times per night. BOWEL: He reports a  bowel movement everyday, normal bowel movements.   Fatigue: Appetite: Weight:

## 2017-04-19 NOTE — Telephone Encounter (Signed)
Medication has been sent. Pt aware. 

## 2017-04-26 ENCOUNTER — Ambulatory Visit
Admission: RE | Admit: 2017-04-26 | Discharge: 2017-04-26 | Disposition: A | Discharge status: Home / Self Care | Payer: Medicare HMO | Source: Ambulatory Visit | Attending: Urology | Admitting: Urology

## 2017-04-26 ENCOUNTER — Telehealth: Payer: Self-pay | Admitting: Urology

## 2017-04-26 ENCOUNTER — Telehealth: Payer: Self-pay | Admitting: Radiation Oncology

## 2017-04-26 DIAGNOSIS — Z510 Encounter for antineoplastic radiation therapy: Secondary | ICD-10-CM | POA: Insufficient documentation

## 2017-04-26 DIAGNOSIS — Z85818 Personal history of malignant neoplasm of other sites of lip, oral cavity, and pharynx: Secondary | ICD-10-CM | POA: Insufficient documentation

## 2017-04-26 DIAGNOSIS — E43 Unspecified severe protein-calorie malnutrition: Secondary | ICD-10-CM | POA: Insufficient documentation

## 2017-04-26 DIAGNOSIS — C61 Malignant neoplasm of prostate: Secondary | ICD-10-CM | POA: Insufficient documentation

## 2017-04-26 NOTE — Telephone Encounter (Signed)
Understand from the patient he has yet to hear back from Dr. Noreene Filbert office about referral to [facility] for laryngectomy. Phoned Amy, RN for Dr. [name] to inquire. Amy reports [facility] "finally returned her call" and she plans to call them back today to obtain an appointment for Adam Marshall. She committed to calling [name] with his appointment for [facility], RN denies that this RN can do anything additional to facilitate this process moving forward more quickly.

## 2017-04-26 NOTE — Telephone Encounter (Signed)
I attempted to call Adam Marshall this morning regarding his scheduled appointment this afternoon.  There was no answer but I was able to leave a detailed message on his voicemail. I advised that while the appointment this afternoon is no longer necessary, since he has been evaluated with Dr. [name] in the interim and is being referred to [facility] for further evaluation and treatment of the recurrent left piriform sinus carcinoma, we are happy to see him if he prefers.  I requested that he retun my call to confirm whether he is planning to come in for a visit today or not and also to confirm that he has heard back from Dr. Noreene Filbert office regarding an appointment date/time with Dr. Nicolette Bang, et al at [facility].   Nicholos Johns, PA-C

## 2017-04-27 ENCOUNTER — Telehealth: Payer: Self-pay | Admitting: Family

## 2017-04-27 NOTE — Telephone Encounter (Signed)
Pt called for a refill of his NEOMYCIN-POLYMYXIN-HYDROCORTISONE (CORTISPORIN) 1 % SOLN OTIC solution  Same pharmacy  He has an appt on 9/5 and would like to know what he can do for his ear pain, he states he been putting peroxide in his ear.  Please advise

## 2017-04-28 MED ORDER — NEOMYCIN-POLYMYXIN-HC 1 % OT SOLN: 3.0000 [drp] | Freq: Four times a day (QID) | OTIC | 0 refills | Status: AC

## 2017-04-28 NOTE — Telephone Encounter (Signed)
Done erx 

## 2017-04-28 NOTE — Telephone Encounter (Signed)
Please advise in Greg's absence.

## 2017-05-11 IMAGING — MR MR ABDOMEN WO/W CM
9 of 21 series · 19 of 48 positions shown · IV contrast (multihance)
Comparison: Multiple exams, including nuclear medicine PET-CT of
04/11/2016

CLINICAL DATA: Cirrhosis. Small hypo attenuating liver lesions.
Left hepatic lobe calcifications. Question pancreatic lesions.
History of prostate cancer and piriform sinus malignancy.

EXAM:
MRI ABDOMEN WITHOUT AND WITH CONTRAST
TECHNIQUE: Multiplanar multisequence MR imaging of the abdomen was performed
both before and after the administration of intravenous contrast.
CONTRAST:  9mL MULTIHANCE GADOBENATE DIMEGLUMINE 529 MG/ML IV SOLN

[Series 3: T2 fat-sat · axial · 5.0mm · 0.78mm/px · z∈[-227,+43]mm · 2 of 55 slices shown]
[im 1/55]
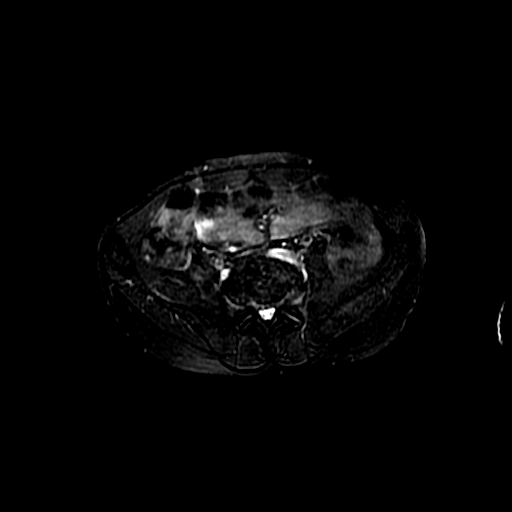
[im 55/55]
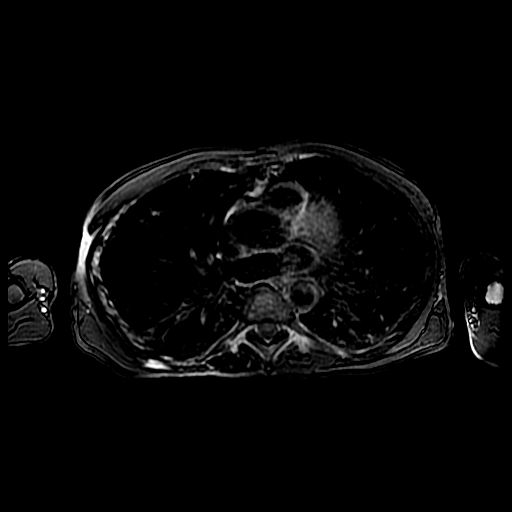

[Series 4: DWI b500 · axial · 6.0mm · 1.48mm/px · z∈[-243,+54]mm · 3 of 78 slices shown]
[im 1/78]
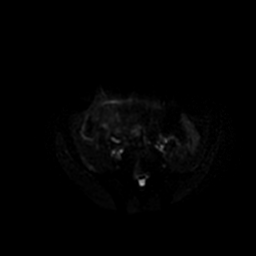
[im 39/78]
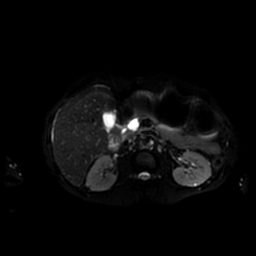
[im 78/78]
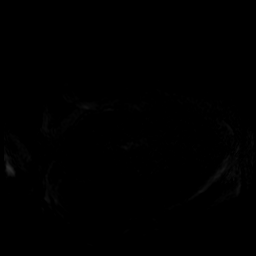

[Series 6: ax dualecho · axial · 5.0mm · 0.78mm/px · z∈[-228,+32]mm · 4 of 106 slices shown]
[im 1/106]
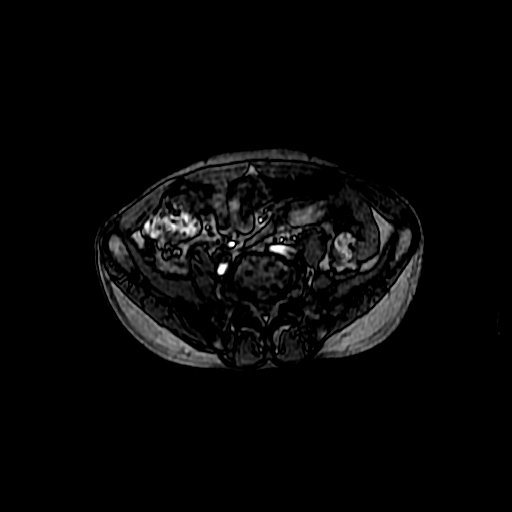
[im 36/106]
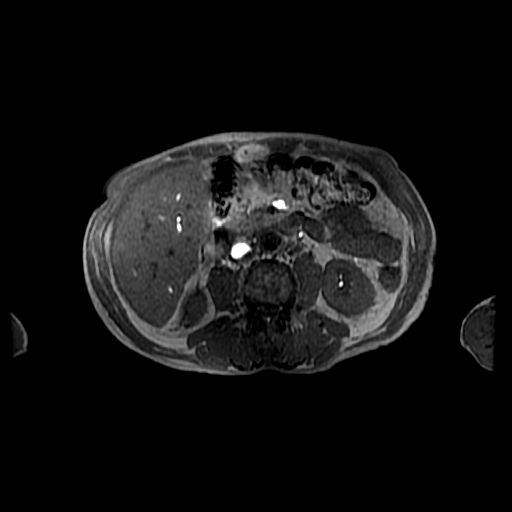
[im 71/106]
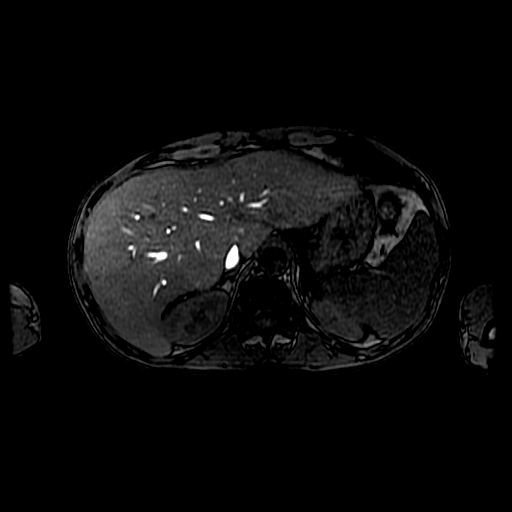
[im 106/106]
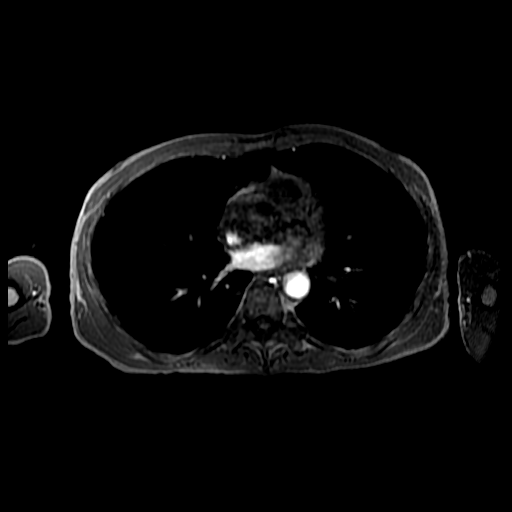

[Series 7: T2 · axial · 5.0mm · 0.78mm/px · z∈[-228,+32]mm · 2 of 53 slices shown (1 of 2)]
[im 1/53]
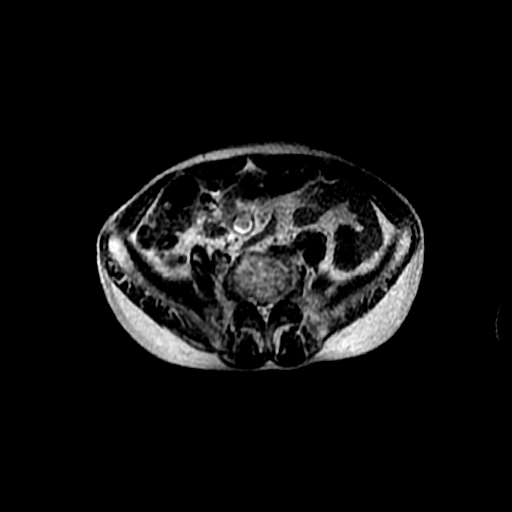
[im 53/53]
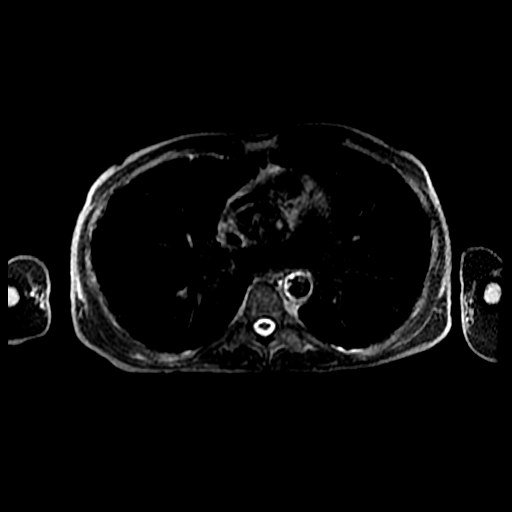

[Series 8: bSSFP · coronal · 5.0mm · 0.74mm/px · 1 of 38 slices shown]
[im 1/38]
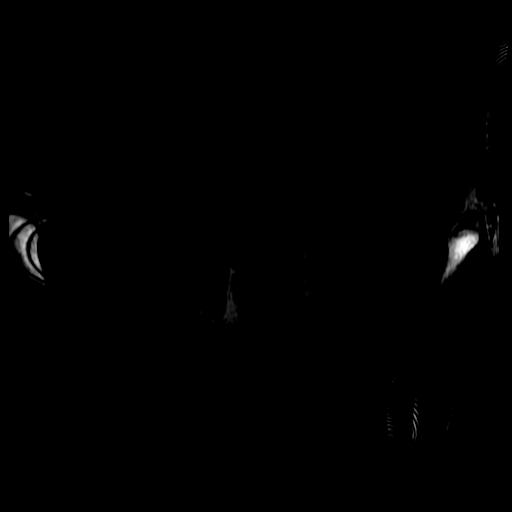

[Series 9: T2 · coronal · 5.0mm · 0.74mm/px · 1 of 38 slices shown (2 of 2)]
[im 1/38]
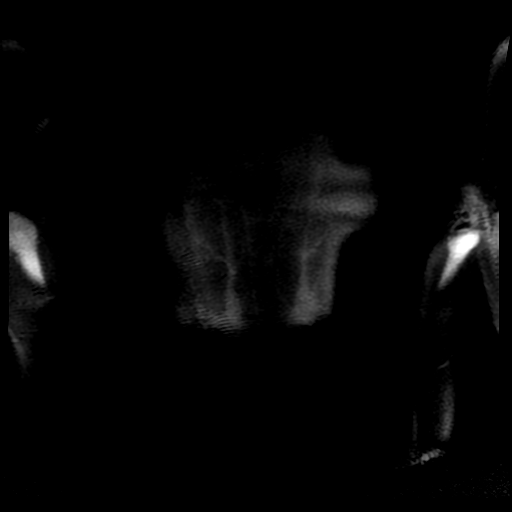

[Series 400: DWI · axial · 6.0mm · 1.48mm/px · 1 of 39 slices shown]
[im 1/39]
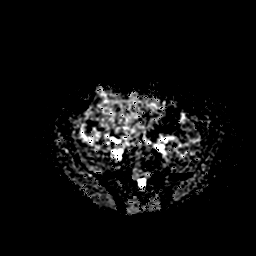

[Series 500: reformatted · axial · 1.6mm · 0.62mm/px · z∈[-103,+27]mm · 3 of 117 slices shown (1 of 2)]
[im 1/117]
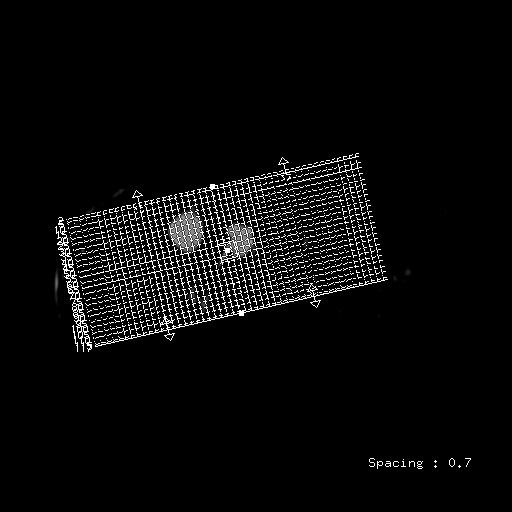
[im 59/117]
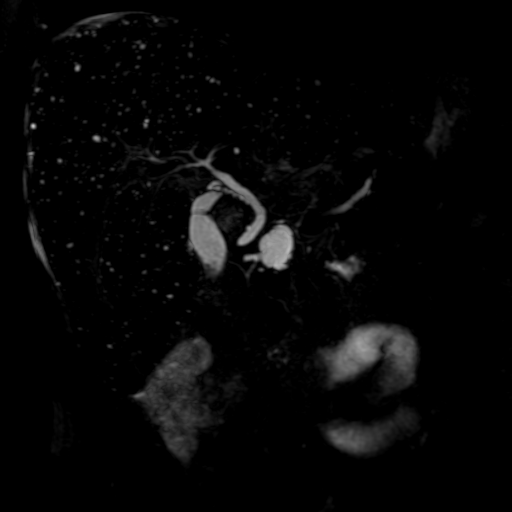
[im 117/117]
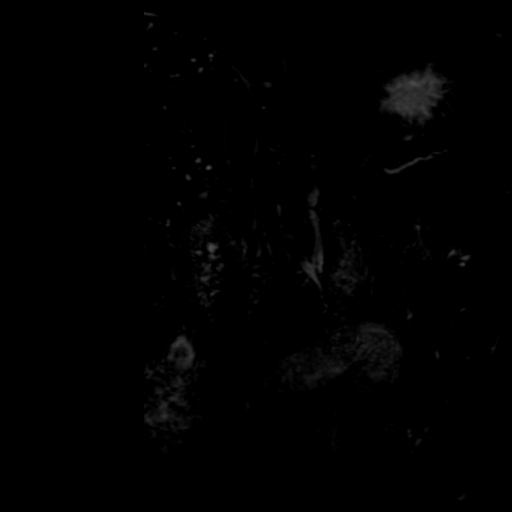

[Series 501: reformatted · sagittal · 100.0mm · 0.51mm/px · 2 of 180 slices shown (2 of 2)]
[im 1/180]
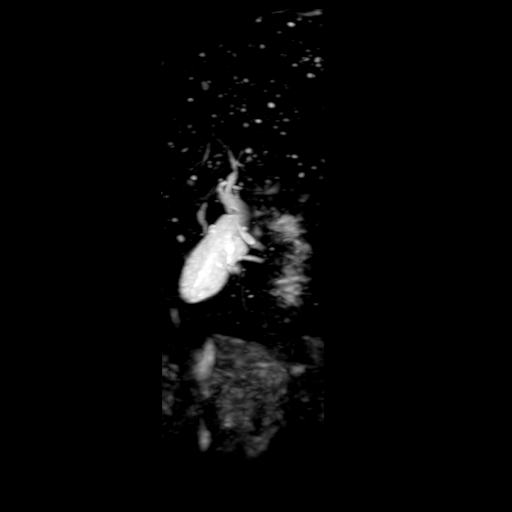
[im 45/180]
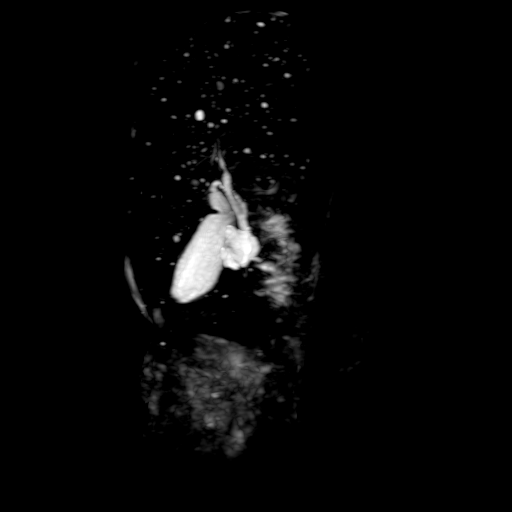

[19 of 48 positions shown; findings below may reference images not displayed]

FINDINGS: Lower chest: Scarring anteriorly at the right lung base presumably
in the right middle lobe.

Hepatobiliary: Innumerable tiny T2 hyperintense lesions throughout
the liver, generally measuring less than 5 mm diameter, many in the
1-2 mm range, no appreciable enhancement. No enhancing central Esa-Matti
identified ; no significant diffusion restriction. Micro nodular
appearance throughout the liver diffusely on T1 weighted images. No
biliary dilatation. Gallbladder unremarkable. Lobular morphology of
the liver suspicious for cirrhosis. There is some mild heterogeneity
of enhancement throughout the liver suggesting some reticular
underlying fibrosis.

Pancreas: Cystic lesion in the pancreatic head measuring 2.1 by
by 2.2 cm, may well connect to the dorsal pancreatic duct, dorsal
pancreatic duct measures up to 3 mm which is upper normal size. Some
images such as image [DATE] suggest a small amount of septation in
this lesion, but there is no measurable enhancement or significant
nodular enhancement. Pancreas otherwise unremarkable.

Spleen: There is some dropout of splenic activity on inphase images
compared to the out of phase images along with a fine micro
nodularity reason the possibility of small siderotic nodules.

Adrenals/Urinary Tract: There is out of phase several tiny
hypoenhancing lesions in the left kidney are technically too small
to characterize, although statistically highly likely to be small
benign cysts. Dropout of activity in the somewhat prominent left
adrenal gland suggesting that the small left adrenal mass is likely
an adenoma.

Stomach/Bowel: Unremarkable

Vascular/Lymphatic: There is some atherosclerotic narrowing
proximally in the right renal artery. Small caliber recanalized
umbilical vein. At the site of known calcification along the
inferior margin of the left hepatic lobe, no definite is
intravascular thrombus is identified.

Other: No ascites.

Musculoskeletal: Lower lumbar degenerative endplate disease and
degenerative disc disease. Marrow heterogeneity is present. Although
this can be caused by marrow infiltrative processes, the most common
causes include anemia, smoking, obesity, or advancing age.
IMPRESSION: 1. Innumerable tiny nonenhancing cystic lesions in the liver.
Appearance favors bile duct hamartomas. Given the lack of
enhancement, Jim disease, hepatic microabscesses, and hepatic
peloisis are considered unlikely.
2. This is superimposed on morphologic features suggesting early
cirrhosis, as well as some heterogeneous late enhancement in the
liver which may reflect underlying fibrosis. Active hepatitis might
also cause heterogeneous enhancement and correlation with a
hepatitis panel might be considered.
3. Cystic lesion in the pancreatic head measuring up to 2.2 cm in
diameter, may well connect to the borderline dilated dorsal
pancreatic duct. Intraductal papillary mucinous neoplasm is a
possibility given this appearance. Based on current guidelines, this
lesion should be reimaged in 6 months time in order to assess for
interval growth. This recommendation follows ACR consensus
guidelines: Management of Incidental Pancreatic Cysts: A White Paper
of the ACR Incidental Findings Committee. [HOSPITAL]
0293;[DATE].
4. Low signal intensity nodules in the spleen with some reduction in
signal head in the spleen on the inphase images raising the
possibility of siderotic nodules.
5. Marrow heterogeneity is present. Although this can be caused by
marrow infiltrative processes, the most common causes include
anemia, smoking, obesity, or advancing age.
6. Degenerative endplate findings at L5-S1.
7. Recanalized umbilical vein compatible with portal venous
hypertension.
8. The calcification along the inferior margin of the left hepatic
lobe does not have a well seen correlate and MRI, and may be
dystrophic.

## 2017-05-18 ENCOUNTER — Telehealth: Payer: Self-pay | Admitting: Radiation Oncology

## 2017-05-18 NOTE — Telephone Encounter (Signed)
Returned patient's call. Patient reports he had his pre op appointment today at [facility] in preparation for his neck surgery on Monday, September 17th. Patent reports blood in urine x 2 days which has since resolved. Patient denies any needs at this time and reports his intent to stay in touch with this RN with status updates.

## 2017-05-28 ENCOUNTER — Other Ambulatory Visit: Payer: Self-pay | Admitting: Family

## 2017-08-15 NOTE — Therapy (Signed)
[facility] [address] [place], [place], [address] Phone: [phone]   Fax:  [phone]  Patient Details  Name: Adam Marshall MRN: [id number] Date of Birth: [birth year] Referring Provider:  [name], MD  Encounter Date: 08/15/2017  SPEECH THERAPY DISCHARGE SUMMARY  Visits from Start of Care: 2  Current functional level related to goals / functional outcomes: Pt's goals at last scheduled session were as follows. Pt has not been seen in over one year and SLP is of opinion pt no longer desires ST.  SLP Short Term Goals - 08/02/16 1238              SLP SHORT TERM GOAL #1    Title pt will tell SLP why he is completing HEP with rare min A    Time 1    Period --  therapy visit    Status On-going  not met 08-02-16         SLP SHORT TERM GOAL #2    Title pt will complete dysphagia HEP with rare min A    Time 1    Period --  therapy visit    Status On-going  not met 08-02-16                       SLP Long Term Goals - 08/02/16 1238              SLP LONG TERM GOAL #1    Title pt will complete HEP with independence    Time 3    Period --  visits, for all LTGs    Status On-going         SLP LONG TERM GOAL #2    Title pt will tell SLP how a food journal can assist return to full and regular PO diet    Time 3    Period --  visits    Status On-going                       Plan - 08/02/16 1235     Clinical Impression Statement Pt presents with dysgeusia as a s/e from head/neck radiation ending 06-21-16. His oropharyngeal swallow appears WNL at this time, although pt complains of difficulties with bread and "hard meat". Pt would cont to benefit from cont'd skilled ST to assess proper completion of HEP and to assess safety with current PO.       Remaining deficits: Unknown as pt has not been seen in over 12 months.   Education / Equipment: Late effects head/neck radiation to swallow  function, HEP.  Plan: Patient agrees to discharge.  Patient goals were not met. Patient is being discharged due to not returning since the last visit.  ?????      [place] ,[place], CCC-SLP  08/15/2017, 12:44 PM  [facility] [address] [place], [place], [address] Phone: [phone]   Fax:  [phone]

## 2019-12-22 ENCOUNTER — Emergency Department (HOSPITAL_COMMUNITY): Payer: Medicare HMO

## 2019-12-22 ENCOUNTER — Inpatient Hospital Stay (HOSPITAL_COMMUNITY)
Admission: EM | Admit: 2019-12-22 | Discharge: 2020-02-04 | DRG: 004 | Disposition: E | Payer: Medicare HMO | Attending: Internal Medicine | Admitting: Internal Medicine

## 2019-12-22 ENCOUNTER — Inpatient Hospital Stay (HOSPITAL_COMMUNITY): Payer: Medicare HMO

## 2019-12-22 DIAGNOSIS — T68XXXA Hypothermia, initial encounter: Secondary | ICD-10-CM

## 2019-12-22 DIAGNOSIS — R410 Disorientation, unspecified: Secondary | ICD-10-CM | POA: Diagnosis not present

## 2019-12-22 DIAGNOSIS — Z85818 Personal history of malignant neoplasm of other sites of lip, oral cavity, and pharynx: Secondary | ICD-10-CM

## 2019-12-22 DIAGNOSIS — Z880 Allergy status to penicillin: Secondary | ICD-10-CM

## 2019-12-22 DIAGNOSIS — D61818 Other pancytopenia: Secondary | ICD-10-CM | POA: Diagnosis not present

## 2019-12-22 DIAGNOSIS — K861 Other chronic pancreatitis: Secondary | ICD-10-CM | POA: Diagnosis present

## 2019-12-22 DIAGNOSIS — E874 Mixed disorder of acid-base balance: Secondary | ICD-10-CM | POA: Diagnosis not present

## 2019-12-22 DIAGNOSIS — K449 Diaphragmatic hernia without obstruction or gangrene: Secondary | ICD-10-CM | POA: Diagnosis present

## 2019-12-22 DIAGNOSIS — A419 Sepsis, unspecified organism: Principal | ICD-10-CM | POA: Diagnosis present

## 2019-12-22 DIAGNOSIS — E876 Hypokalemia: Secondary | ICD-10-CM | POA: Diagnosis not present

## 2019-12-22 DIAGNOSIS — K8522 Alcohol induced acute pancreatitis with infected necrosis: Secondary | ICD-10-CM | POA: Diagnosis not present

## 2019-12-22 DIAGNOSIS — N170 Acute kidney failure with tubular necrosis: Secondary | ICD-10-CM | POA: Diagnosis present

## 2019-12-22 DIAGNOSIS — E869 Volume depletion, unspecified: Secondary | ICD-10-CM | POA: Diagnosis present

## 2019-12-22 DIAGNOSIS — Z87891 Personal history of nicotine dependence: Secondary | ICD-10-CM

## 2019-12-22 DIAGNOSIS — Z20822 Contact with and (suspected) exposure to covid-19: Secondary | ICD-10-CM | POA: Diagnosis present

## 2019-12-22 DIAGNOSIS — J9690 Respiratory failure, unspecified, unspecified whether with hypoxia or hypercapnia: Secondary | ICD-10-CM | POA: Diagnosis present

## 2019-12-22 DIAGNOSIS — K760 Fatty (change of) liver, not elsewhere classified: Secondary | ICD-10-CM | POA: Diagnosis present

## 2019-12-22 DIAGNOSIS — G9340 Encephalopathy, unspecified: Secondary | ICD-10-CM | POA: Diagnosis not present

## 2019-12-22 DIAGNOSIS — K860 Alcohol-induced chronic pancreatitis: Secondary | ICD-10-CM | POA: Diagnosis not present

## 2019-12-22 DIAGNOSIS — E875 Hyperkalemia: Secondary | ICD-10-CM | POA: Diagnosis present

## 2019-12-22 DIAGNOSIS — J9601 Acute respiratory failure with hypoxia: Secondary | ICD-10-CM | POA: Diagnosis not present

## 2019-12-22 DIAGNOSIS — K7030 Alcoholic cirrhosis of liver without ascites: Secondary | ICD-10-CM | POA: Diagnosis present

## 2019-12-22 DIAGNOSIS — E162 Hypoglycemia, unspecified: Secondary | ICD-10-CM | POA: Diagnosis not present

## 2019-12-22 DIAGNOSIS — Z681 Body mass index (BMI) 19 or less, adult: Secondary | ICD-10-CM | POA: Diagnosis not present

## 2019-12-22 DIAGNOSIS — E43 Unspecified severe protein-calorie malnutrition: Secondary | ICD-10-CM | POA: Diagnosis present

## 2019-12-22 DIAGNOSIS — Z833 Family history of diabetes mellitus: Secondary | ICD-10-CM

## 2019-12-22 DIAGNOSIS — D6959 Other secondary thrombocytopenia: Secondary | ICD-10-CM | POA: Diagnosis present

## 2019-12-22 DIAGNOSIS — E871 Hypo-osmolality and hyponatremia: Secondary | ICD-10-CM | POA: Diagnosis present

## 2019-12-22 DIAGNOSIS — R4182 Altered mental status, unspecified: Secondary | ICD-10-CM | POA: Diagnosis present

## 2019-12-22 DIAGNOSIS — M109 Gout, unspecified: Secondary | ICD-10-CM | POA: Diagnosis present

## 2019-12-22 DIAGNOSIS — G92 Toxic encephalopathy: Secondary | ICD-10-CM | POA: Diagnosis present

## 2019-12-22 DIAGNOSIS — R680 Hypothermia, not associated with low environmental temperature: Secondary | ICD-10-CM | POA: Diagnosis present

## 2019-12-22 DIAGNOSIS — R627 Adult failure to thrive: Secondary | ICD-10-CM | POA: Diagnosis not present

## 2019-12-22 DIAGNOSIS — K859 Acute pancreatitis without necrosis or infection, unspecified: Secondary | ICD-10-CM | POA: Diagnosis not present

## 2019-12-22 DIAGNOSIS — R6521 Severe sepsis with septic shock: Secondary | ICD-10-CM | POA: Diagnosis present

## 2019-12-22 DIAGNOSIS — R34 Anuria and oliguria: Secondary | ICD-10-CM | POA: Diagnosis present

## 2019-12-22 DIAGNOSIS — K852 Alcohol induced acute pancreatitis without necrosis or infection: Secondary | ICD-10-CM | POA: Diagnosis present

## 2019-12-22 DIAGNOSIS — N179 Acute kidney failure, unspecified: Secondary | ICD-10-CM

## 2019-12-22 DIAGNOSIS — N4889 Other specified disorders of penis: Secondary | ICD-10-CM | POA: Diagnosis not present

## 2019-12-22 DIAGNOSIS — I10 Essential (primary) hypertension: Secondary | ICD-10-CM | POA: Diagnosis present

## 2019-12-22 DIAGNOSIS — T17490A Other foreign object in trachea causing asphyxiation, initial encounter: Secondary | ICD-10-CM | POA: Diagnosis not present

## 2019-12-22 DIAGNOSIS — E872 Acidosis: Secondary | ICD-10-CM

## 2019-12-22 DIAGNOSIS — C139 Malignant neoplasm of hypopharynx, unspecified: Secondary | ICD-10-CM | POA: Diagnosis not present

## 2019-12-22 DIAGNOSIS — Z66 Do not resuscitate: Secondary | ICD-10-CM | POA: Diagnosis not present

## 2019-12-22 DIAGNOSIS — Z8522 Personal history of malignant neoplasm of nasal cavities, middle ear, and accessory sinuses: Secondary | ICD-10-CM

## 2019-12-22 DIAGNOSIS — R64 Cachexia: Secondary | ICD-10-CM | POA: Diagnosis present

## 2019-12-22 DIAGNOSIS — Z85828 Personal history of other malignant neoplasm of skin: Secondary | ICD-10-CM

## 2019-12-22 DIAGNOSIS — Z923 Personal history of irradiation: Secondary | ICD-10-CM

## 2019-12-22 DIAGNOSIS — Z515 Encounter for palliative care: Secondary | ICD-10-CM | POA: Diagnosis not present

## 2019-12-22 DIAGNOSIS — Z7189 Other specified counseling: Secondary | ICD-10-CM

## 2019-12-22 DIAGNOSIS — G9341 Metabolic encephalopathy: Secondary | ICD-10-CM | POA: Diagnosis not present

## 2019-12-22 DIAGNOSIS — N5089 Other specified disorders of the male genital organs: Secondary | ICD-10-CM | POA: Diagnosis not present

## 2019-12-22 DIAGNOSIS — Z7982 Long term (current) use of aspirin: Secondary | ICD-10-CM

## 2019-12-22 DIAGNOSIS — D509 Iron deficiency anemia, unspecified: Secondary | ICD-10-CM | POA: Diagnosis present

## 2019-12-22 DIAGNOSIS — E8770 Fluid overload, unspecified: Secondary | ICD-10-CM | POA: Diagnosis not present

## 2019-12-22 DIAGNOSIS — D638 Anemia in other chronic diseases classified elsewhere: Secondary | ICD-10-CM | POA: Diagnosis present

## 2019-12-22 DIAGNOSIS — Z808 Family history of malignant neoplasm of other organs or systems: Secondary | ICD-10-CM

## 2019-12-22 DIAGNOSIS — R579 Shock, unspecified: Secondary | ICD-10-CM | POA: Diagnosis present

## 2019-12-22 DIAGNOSIS — K7031 Alcoholic cirrhosis of liver with ascites: Secondary | ICD-10-CM | POA: Diagnosis present

## 2019-12-22 DIAGNOSIS — Z4659 Encounter for fitting and adjustment of other gastrointestinal appliance and device: Secondary | ICD-10-CM

## 2019-12-22 DIAGNOSIS — E878 Other disorders of electrolyte and fluid balance, not elsewhere classified: Secondary | ICD-10-CM | POA: Diagnosis not present

## 2019-12-22 DIAGNOSIS — Z841 Family history of disorders of kidney and ureter: Secondary | ICD-10-CM

## 2019-12-22 DIAGNOSIS — R54 Age-related physical debility: Secondary | ICD-10-CM | POA: Diagnosis present

## 2019-12-22 DIAGNOSIS — R571 Hypovolemic shock: Secondary | ICD-10-CM | POA: Diagnosis present

## 2019-12-22 DIAGNOSIS — Z8546 Personal history of malignant neoplasm of prostate: Secondary | ICD-10-CM

## 2019-12-22 DIAGNOSIS — E8729 Other acidosis: Secondary | ICD-10-CM

## 2019-12-22 LAB — POCT I-STAT EG7
Acid-base deficit: 2 mmol/L (ref 0.0–2.0)
Bicarbonate: 22.4 mmol/L (ref 20.0–28.0)
Calcium, Ion: 1.13 mmol/L — ABNORMAL LOW (ref 1.15–1.40)
HCT: 33 % — ABNORMAL LOW (ref 39.0–52.0)
Hemoglobin: 11.2 g/dL — ABNORMAL LOW (ref 13.0–17.0)
O2 Saturation: 76 %
Potassium: 3 mmol/L — ABNORMAL LOW (ref 3.5–5.1)
Sodium: 130 mmol/L — ABNORMAL LOW (ref 135–145)
TCO2: 23 mmol/L (ref 22–32)
pCO2, Ven: 34.9 mmHg — ABNORMAL LOW (ref 44.0–60.0)
pH, Ven: 7.416 (ref 7.250–7.430)
pO2, Ven: 40 mmHg (ref 32.0–45.0)

## 2019-12-22 LAB — COMPREHENSIVE METABOLIC PANEL
ALT: 78 U/L — ABNORMAL HIGH (ref 0–44)
AST: 125 U/L — ABNORMAL HIGH (ref 15–41)
Albumin: 3.4 g/dL — ABNORMAL LOW (ref 3.5–5.0)
Alkaline Phosphatase: 189 U/L — ABNORMAL HIGH (ref 38–126)
Anion gap: 24 — ABNORMAL HIGH (ref 5–15)
BUN: 97 mg/dL — ABNORMAL HIGH (ref 6–20)
CO2: 22 mmol/L (ref 22–32)
Calcium: 10.2 mg/dL (ref 8.9–10.3)
Chloride: 88 mmol/L — ABNORMAL LOW (ref 98–111)
Creatinine, Ser: 3.09 mg/dL — ABNORMAL HIGH (ref 0.61–1.24)
GFR calc Af Amer: 24 mL/min — ABNORMAL LOW (ref >60)
GFR calc non Af Amer: 21 mL/min — ABNORMAL LOW (ref >60)
Glucose, Bld: 107 mg/dL — ABNORMAL HIGH (ref 70–99)
Potassium: 3 mmol/L — ABNORMAL LOW (ref 3.5–5.1)
Sodium: 134 mmol/L — ABNORMAL LOW (ref 135–145)
Total Bilirubin: 2.5 mg/dL — ABNORMAL HIGH (ref 0.3–1.2)
Total Protein: 7.3 g/dL (ref 6.5–8.1)

## 2019-12-22 LAB — LACTIC ACID, PLASMA
Lactic Acid, Venous: 3.6 mmol/L (ref 0.5–1.9)
Lactic Acid, Venous: 2.6 mmol/L (ref 0.5–1.9)
Lactic Acid, Venous: 6.2 mmol/L (ref 0.5–1.9)
Lactic Acid, Venous: 8.3 mmol/L (ref 0.5–1.9)

## 2019-12-22 LAB — BASIC METABOLIC PANEL
Anion gap: 18 — ABNORMAL HIGH (ref 5–15)
BUN: 79 mg/dL — ABNORMAL HIGH (ref 6–20)
CO2: 12 mmol/L — ABNORMAL LOW (ref 22–32)
Calcium: 7.3 mg/dL — ABNORMAL LOW (ref 8.9–10.3)
Chloride: 102 mmol/L (ref 98–111)
Creatinine, Ser: 2.59 mg/dL — ABNORMAL HIGH (ref 0.61–1.24)
GFR calc Af Amer: 30 mL/min — ABNORMAL LOW (ref >60)
GFR calc non Af Amer: 26 mL/min — ABNORMAL LOW (ref >60)
Glucose, Bld: 90 mg/dL (ref 70–99)
Potassium: 3.4 mmol/L — ABNORMAL LOW (ref 3.5–5.1)
Sodium: 132 mmol/L — ABNORMAL LOW (ref 135–145)

## 2019-12-22 LAB — CBC WITH DIFFERENTIAL/PLATELET
Abs Immature Granulocytes: 0.02 10*3/uL (ref 0.00–0.07)
Basophils Absolute: 0 10*3/uL (ref 0.0–0.1)
Basophils Relative: 0 %
Eosinophils Absolute: 0 10*3/uL (ref 0.0–0.5)
Eosinophils Relative: 0 %
HCT: 31.6 % — ABNORMAL LOW (ref 39.0–52.0)
Hemoglobin: 10.9 g/dL — ABNORMAL LOW (ref 13.0–17.0)
Immature Granulocytes: 0 %
Lymphocytes Relative: 4 %
Lymphs Abs: 0.2 10*3/uL — ABNORMAL LOW (ref 0.7–4.0)
MCH: 33.6 pg (ref 26.0–34.0)
MCHC: 34.5 g/dL (ref 30.0–36.0)
MCV: 97.5 fL (ref 80.0–100.0)
Monocytes Absolute: 0.1 10*3/uL (ref 0.1–1.0)
Monocytes Relative: 3 %
Neutro Abs: 4.7 10*3/uL (ref 1.7–7.7)
Neutrophils Relative %: 93 %
Platelets: 91 10*3/uL — ABNORMAL LOW (ref 150–400)
RBC: 3.24 MIL/uL — ABNORMAL LOW (ref 4.22–5.81)
RDW: 14.8 % (ref 11.5–15.5)
WBC: 5.1 10*3/uL (ref 4.0–10.5)
nRBC: 1.4 % — ABNORMAL HIGH (ref 0.0–0.2)

## 2019-12-22 LAB — GLUCOSE, CAPILLARY
Glucose-Capillary: 34 mg/dL — CL (ref 70–99)
Glucose-Capillary: 179 mg/dL — ABNORMAL HIGH (ref 70–99)
Glucose-Capillary: 51 mg/dL — ABNORMAL LOW (ref 70–99)
Glucose-Capillary: 51 mg/dL — ABNORMAL LOW (ref 70–99)
Glucose-Capillary: 145 mg/dL — ABNORMAL HIGH (ref 70–99)
Glucose-Capillary: 59 mg/dL — ABNORMAL LOW (ref 70–99)
Glucose-Capillary: 139 mg/dL — ABNORMAL HIGH (ref 70–99)
Glucose-Capillary: 65 mg/dL — ABNORMAL LOW (ref 70–99)
Glucose-Capillary: 136 mg/dL — ABNORMAL HIGH (ref 70–99)

## 2019-12-22 LAB — URINALYSIS, ROUTINE W REFLEX MICROSCOPIC
Bilirubin Urine: NEGATIVE
Glucose, UA: 50 mg/dL — AB
Ketones, ur: NEGATIVE mg/dL
Nitrite: NEGATIVE
Protein, ur: 100 mg/dL — AB
RBC / HPF: >50 RBC/hpf — ABNORMAL HIGH (ref 0–5)
Specific Gravity, Urine: 1.015 (ref 1.005–1.030)
WBC, UA: >50 WBC/hpf — ABNORMAL HIGH (ref 0–5)
pH: 5 (ref 5.0–8.0)

## 2019-12-22 LAB — I-STAT CHEM 8, ED
BUN: 91 mg/dL — ABNORMAL HIGH (ref 6–20)
Calcium, Ion: 1.14 mmol/L — ABNORMAL LOW (ref 1.15–1.40)
Chloride: 90 mmol/L — ABNORMAL LOW (ref 98–111)
Creatinine, Ser: 2.8 mg/dL — ABNORMAL HIGH (ref 0.61–1.24)
Glucose, Bld: 102 mg/dL — ABNORMAL HIGH (ref 70–99)
HCT: 35 % — ABNORMAL LOW (ref 39.0–52.0)
Hemoglobin: 11.9 g/dL — ABNORMAL LOW (ref 13.0–17.0)
Potassium: 3 mmol/L — ABNORMAL LOW (ref 3.5–5.1)
Sodium: 129 mmol/L — ABNORMAL LOW (ref 135–145)
TCO2: 23 mmol/L (ref 22–32)

## 2019-12-22 LAB — ETHANOL: Alcohol, Ethyl (B): <10 mg/dL (ref <10)

## 2019-12-22 LAB — RESPIRATORY PANEL BY RT PCR (FLU A&B, COVID)
Influenza A by PCR: NEGATIVE
Influenza B by PCR: NEGATIVE
SARS Coronavirus 2 by RT PCR: NEGATIVE

## 2019-12-22 LAB — T4, FREE: Free T4: 0.7 ng/dL (ref 0.61–1.12)

## 2019-12-22 LAB — LIPASE, BLOOD: Lipase: 2648 U/L — ABNORMAL HIGH (ref 11–51)

## 2019-12-22 LAB — PROTIME-INR
INR: 1.1 (ref 0.8–1.2)
Prothrombin Time: 13.7 seconds (ref 11.4–15.2)

## 2019-12-22 LAB — HIV ANTIBODY (ROUTINE TESTING W REFLEX): HIV Screen 4th Generation wRfx: NONREACTIVE

## 2019-12-22 LAB — TSH
TSH: 37.518 u[IU]/mL — ABNORMAL HIGH (ref 0.350–4.500)
TSH: 30.097 u[IU]/mL — ABNORMAL HIGH (ref 0.350–4.500)

## 2019-12-22 LAB — MRSA PCR SCREENING: MRSA by PCR: NEGATIVE

## 2019-12-22 LAB — CORTISOL: Cortisol, Plasma: 40.6 ug/dL

## 2019-12-22 LAB — APTT: aPTT: 37 seconds — ABNORMAL HIGH (ref 24–36)

## 2019-12-22 MED ORDER — DEXTROSE 50 % IV SOLN
1.0000 | Freq: Once | INTRAVENOUS | Status: AC
  Administered 2019-12-22: 50 mL via INTRAVENOUS

## 2019-12-22 MED ORDER — ORAL CARE MOUTH RINSE
15.0000 mL | Freq: Two times a day (BID) | OROMUCOSAL | Status: DC
  Administered 2019-12-23 – 2020-01-06 (×23): 15 mL via OROMUCOSAL

## 2019-12-22 MED ORDER — LACTATED RINGERS IV SOLN: INTRAVENOUS | Status: DC

## 2019-12-22 MED ORDER — DEXTROSE 50 % IV SOLN
INTRAVENOUS | Status: AC
  Administered 2019-12-22: 50 mL via INTRAVENOUS
  Filled 2019-12-22: qty 50

## 2019-12-22 MED ORDER — SODIUM CHLORIDE 0.9 % IV SOLN: 2.0000 g | Freq: Once | INTRAVENOUS | Status: DC

## 2019-12-22 MED ORDER — LACTATED RINGERS IV BOLUS
1000.0000 mL | Freq: Once | INTRAVENOUS | Status: AC
  Administered 2019-12-22: 1000 mL via INTRAVENOUS

## 2019-12-22 MED ORDER — CHLORHEXIDINE GLUCONATE CLOTH 2 % EX PADS
6.0000 | MEDICATED_PAD | Freq: Every day | CUTANEOUS | Status: DC
  Administered 2019-12-22 – 2020-01-06 (×13): 6 via TOPICAL

## 2019-12-22 MED ORDER — SODIUM CHLORIDE 0.9 % IV SOLN
2.0000 g | INTRAVENOUS | Status: DC
  Administered 2019-12-23: 2 g via INTRAVENOUS
  Filled 2019-12-22: qty 2

## 2019-12-22 MED ORDER — DEXTROSE 50 % IV SOLN
INTRAVENOUS | Status: AC
  Filled 2019-12-22: qty 50

## 2019-12-22 MED ORDER — LACTATED RINGERS IV BOLUS (SEPSIS): 1000.0000 mL | Freq: Once | INTRAVENOUS | Status: DC

## 2019-12-22 MED ORDER — GERHARDT'S BUTT CREAM
TOPICAL_CREAM | Freq: Two times a day (BID) | CUTANEOUS | Status: DC | PRN
  Filled 2019-12-22 (×3): qty 1

## 2019-12-22 MED ORDER — VANCOMYCIN HCL IN DEXTROSE 1-5 GM/200ML-% IV SOLN
1000.0000 mg | Freq: Once | INTRAVENOUS | Status: AC
  Administered 2019-12-22: 1000 mg via INTRAVENOUS
  Filled 2019-12-22: qty 200

## 2019-12-22 MED ORDER — DEXTROSE 10 % IV SOLN: INTRAVENOUS | Status: AC

## 2019-12-22 MED ORDER — ACETAMINOPHEN 325 MG PO TABS: 650.0000 mg | ORAL_TABLET | ORAL | Status: DC | PRN

## 2019-12-22 MED ORDER — FENTANYL CITRATE (PF) 100 MCG/2ML IJ SOLN
50.0000 ug | Freq: Once | INTRAMUSCULAR | Status: AC
  Administered 2019-12-22: 50 ug via INTRAVENOUS
  Filled 2019-12-22: qty 2

## 2019-12-22 MED ORDER — LACTATED RINGERS IV BOLUS
1000.0000 mL | Freq: Once | INTRAVENOUS | Status: AC
  Administered 2019-12-22: 17:00:00 1000 mL via INTRAVENOUS

## 2019-12-22 MED ORDER — CHLORHEXIDINE GLUCONATE 0.12 % MT SOLN
15.0000 mL | Freq: Two times a day (BID) | OROMUCOSAL | Status: DC
  Administered 2019-12-22 – 2020-01-06 (×27): 15 mL via OROMUCOSAL
  Filled 2019-12-22 (×22): qty 15

## 2019-12-22 MED ORDER — LACTATED RINGERS IV BOLUS (SEPSIS)
500.0000 mL | Freq: Once | INTRAVENOUS | Status: AC
  Administered 2019-12-22: 500 mL via INTRAVENOUS

## 2019-12-22 MED ORDER — POTASSIUM CHLORIDE 10 MEQ/100ML IV SOLN
10.0000 meq | INTRAVENOUS | Status: AC
  Administered 2019-12-22 (×4): 10 meq via INTRAVENOUS
  Filled 2019-12-22 (×4): qty 100

## 2019-12-22 MED ORDER — DEXTROSE 50 % IV SOLN: 25.0000 mL | Freq: Once | INTRAVENOUS | Status: AC

## 2019-12-22 MED ORDER — SODIUM CHLORIDE 0.9 % IV BOLUS
1000.0000 mL | Freq: Once | INTRAVENOUS | Status: AC
  Administered 2019-12-22: 1000 mL via INTRAVENOUS

## 2019-12-22 MED ORDER — FENTANYL CITRATE (PF) 100 MCG/2ML IJ SOLN
25.0000 ug | Freq: Once | INTRAMUSCULAR | Status: AC
  Administered 2019-12-22: 25 ug via INTRAVENOUS
  Filled 2019-12-22: qty 2

## 2019-12-22 MED ORDER — DEXTROSE IN LACTATED RINGERS 5 % IV SOLN: INTRAVENOUS | Status: DC

## 2019-12-22 MED ORDER — POLYETHYLENE GLYCOL 3350 17 G PO PACK: 17.0000 g | PACK | Freq: Every day | ORAL | Status: DC | PRN

## 2019-12-22 MED ORDER — DEXTROSE 50 % IV SOLN: 50.0000 mL | Freq: Once | INTRAVENOUS | Status: AC

## 2019-12-22 MED ORDER — SODIUM CHLORIDE 0.9 % IV SOLN
2.0000 g | Freq: Once | INTRAVENOUS | Status: AC
  Administered 2019-12-22: 2 g via INTRAVENOUS
  Filled 2019-12-22: qty 2

## 2019-12-22 MED ORDER — METRONIDAZOLE IN NACL 5-0.79 MG/ML-% IV SOLN
500.0000 mg | Freq: Once | INTRAVENOUS | Status: AC
  Administered 2019-12-22: 500 mg via INTRAVENOUS
  Filled 2019-12-22: qty 100

## 2019-12-22 MED ORDER — VANCOMYCIN VARIABLE DOSE PER UNSTABLE RENAL FUNCTION (PHARMACIST DOSING): Status: DC

## 2019-12-22 MED ORDER — DOCUSATE SODIUM 100 MG PO CAPS: 100.0000 mg | ORAL_CAPSULE | Freq: Two times a day (BID) | ORAL | Status: DC | PRN

## 2019-12-22 MED ORDER — HEPARIN SODIUM (PORCINE) 5000 UNIT/ML IJ SOLN
5000.0000 [IU] | Freq: Three times a day (TID) | INTRAMUSCULAR | Status: DC
  Administered 2019-12-22 (×2): 5000 [IU] via SUBCUTANEOUS
  Filled 2019-12-22 (×2): qty 1

## 2019-12-22 NOTE — Progress Notes (Signed)
[facility] Progress Note Patient Name: MICHEAL YUSKA DOB: Oct 01, 1960 MRN: [id number]   Date of Service  12/07/2019  HPI/Events of Note  Discussed with bed side RN Camera eval done.  1. Hypoglycemia. On D5 LR at 150 for pancreatitis. Surrounding inflammation on colon.  2. MAP > 75. S/p EGDT with fluids for sepsis?Marland Kitchen  On 3 lit o2, HR 96. Encephalopathy. Hypothermia getting better.looks dry.  3. UOP low.   eICU Interventions  - stat BMP, TSH, NS 1 lit bolus and follow LA.  - D10 at 10 ml/hr.  - asp precautions.      Intervention Category Intermediate Interventions: Hyperglycemia - evaluation and treatment;Oliguria - evaluation and management  Elmer Sow 12/28/2019, 8:31 PM

## 2019-12-22 NOTE — Progress Notes (Addendum)
CRITICAL VALUE ALERT  Critical Value:  CBG 34  Date & Time Notied:  4/18 1628  Provider Notified: Dr. Loanne Drilling  Orders Received/Actions taken: 1 amp D50   CBG rechecked 179    Critical Value:  CBG 51 x2  Date & Time Notied:  4/18 1817  Provider Notified: Dr. Loanne Drilling  Orders Received/Actions taken: 1 amp D50, awaiting orders    CBG rechecked 145

## 2019-12-22 NOTE — Progress Notes (Signed)
Pharmacy Antibiotic Note  Adam Marshall is a 60 y.o. male admitted on 12/20/2019 with sepsis.  Pharmacy has been consulted for vancomycin and aztreonam dosing.  Noted pcn allergy, low risk reaction and low cross-sensitivity with 4th gen cephalosporin, will trial cefepime.  SCr 2.8 (BL <1)  Plan: Vancomycin 1000 mg IV x 1, then variable dosing based on renal function Cefepime 2g IV every 24 hours Monitor renal function, Cx and clinical progression to narrow Vancomycin levels at steady state     No data recorded.  No results for input(s): WBC, CREATININE, LATICACIDVEN, VANCOTROUGH, VANCOPEAK, VANCORANDOM, GENTTROUGH, GENTPEAK, GENTRANDOM, TOBRATROUGH, TOBRAPEAK, TOBRARND, AMIKACINPEAK, AMIKACINTROU, AMIKACIN in the last 168 hours.  CrCl cannot be calculated (Patient's most recent lab result is older than the maximum 21 days allowed.).    Allergies  Allergen Reactions  . Penicillins Hives and Rash    Has patient had a PCN reaction causing immediate rash, facial/tongue/throat swelling, SOB or lightheadedness with hypotension:Yes Has patient had a PCN reaction causing severe rash involving mucus membranes or skin necrosis:unsure Has patient had a PCN reaction that required hospitalization:No Has patient had a PCN reaction occurring within the last 10 years:No  If all of the above answers are "NO", then may proceed with Cephalosporin use. Has patient had a PCN reaction causing immediate rash, facial/tongue/throat swelling, SOB or lightheadedness with hypotension:Yes Has patient had a PCN reaction causing severe rash involving mucus membranes or skin necrosis:unsure Has patient had a PCN reaction that required hospitalization:No Has patient had a PCN reaction occurring within the last 10 years:No  If all of the above answers are "NO", then may proceed with Cephalosporin use.     Bertis Ruddy, PharmD Clinical Pharmacist ED Pharmacist Phone # [phone] 12/29/2019 11:25 AM

## 2019-12-22 NOTE — H&P (Addendum)
NAME:  Adam Marshall, MRN:  [id number], DOB:  1960/10/21, LOS: 0 ADMISSION DATE:  12/28/2019, CONSULTATION DATE:  01/01/2020 REFERRING MD:  Dr. Regenia Skeeter, CHIEF COMPLAINT:  AMS   Brief History    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   History of present illness    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 History is limited as patient is altered and family at beside has very limited knowledge of patient's recent history.   Per EMS, roommate stated patient has been altered for several days now, nearly 1 week. He has been unable to walk to the bathroom requiring Depends. BP taken by EMS was 44/20.  On arrival to the ED, patient was hypothermic at 83.3 F, hypotensive to 89/50 with HR of 49.   Past Medical History   Past Medical History:  Diagnosis Date  . Abnormal LFTs   . 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 Chronic back pain   . Chronic knee pain    left  . Cirrhosis ([place])   . Fatty liver   . Gout   . Hiatal hernia   . Hypertension   . Pancreatic lesion   . Pancreatitis   . Prostate cancer [facility])    Significant Hospital Events   4/18: Admitted to the ICU  Consults:  None  Procedures:  None   Significant Diagnostic Tests:   KUB (4/18) 1. Nonobstructive bowel gas pattern. 2. Patulous loops of small bowel in the upper abdomen raising the possibility of small bowel enteritis.  Micro Data:  Blood cultures (4/18): pending Urine cultures (4/18): pending   Antimicrobials:  4/18: Metronidazole  x1 4/18: Cefepime >> 4/18: Vancomycin >>  Interim history/subjective:  See above.   Objective   Blood pressure (!) 88/67, pulse 62, temperature (!) 85.7 F (29.8 C), resp. rate 18, height 5\' 6"  (1.676 m), weight 45.4 kg, SpO2 100 %.        Intake/Output Summary (Last 24 hours) at 12/19/2019 1338 Last data filed at 12/28/2019 1314 Gross per 24 hour  Intake 1699.94 ml  Output -  Net 1699.94 ml   Filed Weights   12/21/2019 1156  Weight: 45.4 kg   Physical Exam Vitals and nursing note reviewed.  Constitutional:      Appearance: He is cachectic. He is ill-appearing.  Cardiovascular:     Rate and Rhythm: Normal rate and regular rhythm.     Heart sounds: Murmur (holosystolic murmur ) present.  Pulmonary:     Effort: No accessory muscle usage or respiratory distress.     Breath sounds: Decreased breath sounds (diffuse) present. No wheezing, rhonchi or rales.  Abdominal:     General: Abdomen is scaphoid. Bowel sounds are decreased. There is no distension.     Palpations: Abdomen is soft. There is no fluid wave or mass.     Tenderness: There is abdominal tenderness in the epigastric area and left upper quadrant.  Musculoskeletal:        General: No tenderness.     Right lower leg: No edema.     Left  lower leg: No edema.  Skin:    General: Skin is dry.     Coloration: Skin is ashen. Skin is not jaundiced.     Findings: No bruising.     Comments: Very dry lower extremity skin  Neurological:     Mental Status: He is disoriented.     Deep Tendon Reflexes:     Reflex Scores:      Patellar reflexes are 2+ on the right side and 2+ on the left side.    Comments: Unable to follow commands but will look towards sound. No focal deficits noted.       Resolved Hospital Problem list   None   Assessment & Plan:   # Shock, likely septic versus hypovolemic  # Anion gap metabolic acidosis, lactic acidosis  Differential for etiology includes septic shock versus hypovolemic. Patient was  quite hypothermic on presentation with inflammatory changes noted on KUB. No leukocytosis at this time. Other than GI, no other current infectious sources. Will check blood and urine cultures, as well as additional abdominal imaging. Hypovolemia likely from poor social support in the setting of AMS for at least 1 week during which time it is unknown if patient tolerated anything PO. Patient's pancreatitis may be contributing as well.   Lactic acid responding well to fluid resuscitation, so far received 2.5 L.   - Admit to the ICU for close monitoring  - Telemetry  - Vancomycin, Cefepime per pharmacy dosing  - 500 cc boluses of LR PRN for hypotension - If MAP unable to maintain > 65, consider low dose Levophed  - Trend culture studies  - CT abdomen/pelvis   # Pancreatitis  Mr. Hammer has a past medical history of pancreatitis and AUD, however current alcohol intake is unknown. Lipase markedly elevated at 2600 today with abdominal tenderness noted on examination.  - s/p fluid resuscitation  - Bowel rest today  - If CT abdomen/pelvis is inconclusive, consider RUQ ultrasound   # Hypothermia  Likely infectious in etiology, however his thyroid status may be contributing.   - Bair hugger - Monitor temperature closely   # Acute Renal Failure  # Hyperkalemia  Due to shock, exacerbated further by patient's hypovolemia.   - s/p fluid resuscitation - 40 mEq KCL IV - Repeat BMP this afternoon - Trend creatinine - Trend urine output  - Avoid nephrotoxic agents   # Cirrhosis  # Thrombocytopenia  Secondary to previous AUD. Unknown alcohol use since 2017. No evidence of liver failure at this time or ascites. T. Bili elevated at 2.5 but no jaundice on examination.   - Trend LFTs as patient is at high risk of developing shock liver  # Malnutrition 30 lb weight loss in the last year. Family states it has mostly occurred in the past few months. Concerning for re-emergence of previous  malignancy.   - Consult dietician once no longer on bowel rest  # Hyponatremia  Chronic and stable.   # Elevated TSH  Elevated TSH at 37 however free T4 is normal at 0.7. Will hold off on thyroid hormone replacement at this time.   Best practice:  Diet: NPO Pain/Anxiety/Delirium protocol (if indicated): N/A VAP protocol (if indicated): N/A DVT prophylaxis: Heparin GI prophylaxis: N/A Glucose control: N/A Mobility: Bedrest Code Status: FULL Family Communication: Brother updated at bedside on 4/18 Disposition: ICU  Labs   CBC: Recent Labs  Lab 01/03/2020 1124 12/24/2019 1142  WBC 5.1  --   NEUTROABS 4.7  --  HGB 10.9* 11.9*  11.2*  HCT 31.6* 35.0*  33.0*  MCV 97.5  --   PLT 91*  --     Basic Metabolic Panel: Recent Labs  Lab 12/20/2019 1124 12/06/2019 1142  NA 134* 129*  130*  K 3.0* 3.0*  3.0*  CL 88* 90*  CO2 22  --   GLUCOSE 107* 102*  BUN 97* 91*  CREATININE 3.09* 2.80*  CALCIUM 10.2  --    GFR: Estimated Creatinine Clearance: 18.2 mL/min (A) (by C-G formula based on SCr of 2.8 mg/dL (H)). Recent Labs  Lab 12/07/2019 1124  WBC 5.1  LATICACIDVEN 3.6*    Liver Function Tests: Recent Labs  Lab 12/16/2019 1124  AST 125*  ALT 78*  ALKPHOS 189*  BILITOT 2.5*  PROT 7.3  ALBUMIN 3.4*   Recent Labs  Lab 01/01/2020 1124  LIPASE 2,648*   No results for input(s): AMMONIA in the last 168 hours.  ABG    Component Value Date/Time   HCO3 22.4 12/15/2019 1142   TCO2 23 12/08/2019 1142   TCO2 23 12/17/2019 1142   ACIDBASEDEF 2.0 01/01/2020 1142   O2SAT 76.0 12/29/2019 1142     Coagulation Profile: Recent Labs  Lab 12/16/2019 1124  INR 1.1    Cardiac Enzymes: No results for input(s): CKTOTAL, CKMB, CKMBINDEX, TROPONINI in the last 168 hours.  HbA1C: Hemoglobin A1C  Date/Time Value Ref Range Status  05/02/2011 11:52 AM 5.2  Final    CBG: No results for input(s): GLUCAP in the last 168 hours.  Review of Systems:   Unable to obtain due to  AMS.  Past Medical History  He,  has a past medical history of Abnormal LFTs, Cancer ([place]), Chronic back pain, Chronic knee pain, Cirrhosis ([place]), Fatty liver, Gout, Hiatal hernia, Hypertension, Pancreatic lesion, Pancreatitis, and Prostate cancer ([place]).   Surgical History    Past Surgical History:  Procedure Laterality Date  . ESOPHAGOGASTRODUODENOSCOPY N/A 03/15/2016   Procedure: ESOPHAGOGASTRODUODENOSCOPY (EGD);  Surgeon: Doran Stabler, MD;  Location: [facility];  Service: Endoscopy;  Laterality: N/A;  . KNEE SURGERY     left  . PANENDOSCOPY N/A 03/18/2016   Procedure: DIRECT LARYNGOSCOPY, WITH BRONCHOSCOPY, AND ESOPHAGOSCOPY, WITH BIOPSY;  Surgeon: [name], MD;  Location: [facility];  Service: ENT;  Laterality: N/A;  . PROSTATE BIOPSY       Social History   reports that he quit smoking about 3 years ago. His smoking use included cigarettes. He has a 9.00 pack-year smoking history. He has never used smokeless tobacco. He reports current alcohol use of about 120.0 standard drinks of alcohol per week. He reports that he does not use drugs.   Family History   His family history includes Diabetes in his maternal grandmother and mother; Kidney disease in his sister; Throat cancer (age of onset: 13) in his brother.   Allergies Allergies  Allergen Reactions  . Penicillins Hives and Rash    Has patient had a PCN reaction causing immediate rash, facial/tongue/throat swelling, SOB or lightheadedness with hypotension:Yes Has patient had a PCN reaction causing severe rash involving mucus membranes or skin necrosis:unsure Has patient had a PCN reaction that required hospitalization:No Has patient had a PCN reaction occurring within the last 10 years:No  If all of the above answers are "NO", then may proceed with Cephalosporin use. Has patient had a PCN reaction causing immediate rash, facial/tongue/throat swelling, SOB or lightheadedness with hypotension:Yes Has patient had a PCN  reaction causing severe rash involving mucus  membranes or skin necrosis:unsure Has patient had a PCN reaction that required hospitalization:No Has patient had a PCN reaction occurring within the last 10 years:No  If all of the above answers are "NO", then may proceed with Cephalosporin use.      Home Medications  Prior to Admission medications   Medication Sig Start Date End Date Taking? Authorizing Provider  albuterol (PROVENTIL) (2.5 MG/3ML) 0.083% nebulizer solution Take 3 mLs (2.5 mg total) by nebulization every 6 (six) hours as needed for wheezing or shortness of breath. 03/19/16   Florencia Reasons, MD  allopurinol (ZYLOPRIM) 100 MG tablet Take 1 tablet (100 mg total) by mouth daily. 09/20/16   [name], FNP  amLODipine (NORVASC) 10 MG tablet TAKE 1 TABLET(10 MG) BY MOUTH DAILY 03/13/17   [name], FNP  amLODipine (NORVASC) 10 MG tablet TAKE 1 TABLET(10 MG) BY MOUTH DAILY 04/18/17   [name], FNP  aspirin 81 MG chewable tablet Chew 81 mg by mouth daily.    [provider]  cyclobenzaprine (FLEXERIL) 5 MG tablet TAKE 1 TABLET(5 MG) BY MOUTH THREE TIMES DAILY AS NEEDED FOR MUSCLE SPASMS 05/29/17   [name], FNP  feeding supplement, ENSURE ENLIVE, (ENSURE ENLIVE) LIQD Take 237 mLs by mouth 3 (three) times daily between meals. 03/19/16   Florencia Reasons, MD  Multiple Vitamin (MULTIVITAMIN) tablet Take 1 tablet by mouth daily.    [provider]  NEOMYCIN-POLYMYXIN-HYDROCORTISONE (CORTISPORIN) 1 % SOLN OTIC solution Place 3 drops into the left ear 4 (four) times daily. 04/28/17   Biagio Borg, MD  potassium chloride (K-DUR) 10 MEQ tablet Take 1 tablet (10 mEq total) by mouth daily. 10/27/16   [name], FNP  propranolol (INDERAL) 10 MG tablet TAKE 1 TABLET(10 MG) BY MOUTH TWICE DAILY 10/27/16   [name], FNP  traZODone (DESYREL) 50 MG tablet Take 0.5-1 tablets (25-50 mg total) by mouth at bedtime as needed for sleep. 10/27/16   [name], FNP      Dr. Jose Persia Internal Medicine PGY-1  Pager: [phone] 12/27/2019, 1:38 PM

## 2019-12-22 NOTE — Progress Notes (Signed)
Notified elink about continuously rising Lactic. Now 8.3 despite multiple fluid boluses throughout the day and night shift.

## 2019-12-22 NOTE — Progress Notes (Signed)
Hypoglycemic Event  CBG: 65  Treatment: D50 50 mL (25 gm)  Symptoms: None  Follow-up CBG: Time:2240 CBG Result:136  Possible Reasons for Event: Inadequate meal intake  Comments/MD notified:elink MD, increased D10 to 30 ml/hr    Adam Marshall

## 2019-12-22 NOTE — Progress Notes (Signed)
Re-assess patient this afternoon. He appears more comfortable but continues to be somnolent.   MAP improved to 77 after additional 1 L bolus of LR. Will start patient on maintenance fluids, as distributive shock most consistent with pancreatitis at this time.   RN stated patient's BG is 51 again after it was 34 earlier and he received 1 amp of D50. Will add D5 to fluids.   Plan:  - Continue to bolus PRN to maintain MAP >65. If refractory consider pressor support at that time.  - D5-LR @ 150 cc/hr for 24 hours

## 2019-12-22 NOTE — Progress Notes (Signed)
Hypoglycemic Event  CBG: 59  Treatment: D50 50 mL (25 gm)  Symptoms: None  Follow-up CBG: Time:2106 CBG Result:139  Possible Reasons for Event: Inadequate meal intake  Comments/MD notified:elink MD, new order for [place]

## 2019-12-22 NOTE — Progress Notes (Signed)
[facility] Progress Note Patient Name: HERSHALL EVANSON DOB: [birth year] MRN: [id number]   Date of Service  12/28/2019  HPI/Events of Note  BG low. MAP better after last bolus.  eICU Interventions  Increased D10 to 30 ml/hr via Central or 18 G PIV.      Intervention Category Intermediate Interventions: Other:  Elmer Sow 12/23/2019, 10:21 PM

## 2019-12-22 NOTE — ED Provider Notes (Signed)
[facility] EMERGENCY DEPARTMENT Provider Note   CSN: [id number] Arrival date & time: 12/05/2019  1113  LEVEL 5 CAVEAT - ALTERED MENTAL STATUS  History No chief complaint on file.   Adam Marshall is a 59 y.o. male.  HPI 59 year old male brought in by EMS for altered mental status.  History is very limited as the patient is currently not speaking and seems altered and EMS reports that roommates were not particularly helpful.  Patient lives in an apartment.  Apparently has been altered for several days, up to 1 week.  No longer able to walk to the bathroom as they were putting depends on him.  EMS noted very low blood pressures, 1 blood pressure was showing 44/20.   Past Medical History:  Diagnosis Date  . Abnormal LFTs   . 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 Chronic back pain   . Chronic knee pain    left  . Cirrhosis ([place])   . Fatty liver   . Gout   . Hiatal hernia   . Hypertension   . Pancreatic lesion   . Pancreatitis   . Prostate cancer [facility])     Patient Active Problem List   Diagnosis Date Noted  . Shock ([place]) 12/13/2019  . Ear pain, left 02/27/2017  . Encounter for Medicare annual wellness exam 02/27/2017  . Sleep disturbance 10/27/2016  . Malignant neoplasm of prostate ([place]) 06/24/2016  . 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 Weakness 03/26/2016  . Gout 03/26/2016  . 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 HCAP (healthcare-associated pneumonia) 03/23/2016  . Hypokalemia 03/23/2016  . Hypomagnesemia 03/23/2016  . Protein-calorie malnutrition, severe ([place]) 03/23/2016  . Reflux esophagitis   . Pancreatic lesion   . Normochromic normocytic anemia   . Pyriform sinus mass   . Ascites   . Cirrhosis of liver with ascites ([place]) 03/14/2016  . Anemia due to chronic blood loss 03/14/2016  . Respiratory failure with hypoxia ([place]) 03/14/2016  . Lesion of pancreas   . Generalized abdominal  pain   . Hyponatremia   . Abscess of skin of abdomen 01/13/2016  . Leg cramping 01/13/2016  . Abnormal LFTs (liver function tests) 10/13/2015  . Alcohol use 10/13/2015  . Left knee pain 02/04/2015  . Encounter for general adult medical examination with abnormal findings 12/25/2014  . Medicare annual wellness visit, subsequent 12/25/2014  . Knee pain 12/08/2014  . Chronic pancreatitis ([place]) 05/03/2011  . Rhinorrhea 04/24/2011  . PREMATURE VENTRICULAR CONTRACTIONS 07/07/2010  . HYPERKALEMIA 06/29/2010  . ACUTE GOUTY ARTHROPATHY 06/28/2010  . HYPERTENSION, BENIGN ESSENTIAL 06/28/2010    Past Surgical History:  Procedure Laterality Date  . ESOPHAGOGASTRODUODENOSCOPY N/A 03/15/2016   Procedure: ESOPHAGOGASTRODUODENOSCOPY (EGD);  Surgeon: Doran Stabler, MD;  Location: [facility];  Service: Endoscopy;  Laterality: N/A;  . KNEE SURGERY     left  . PANENDOSCOPY N/A 03/18/2016   Procedure: DIRECT LARYNGOSCOPY, WITH BRONCHOSCOPY, AND ESOPHAGOSCOPY, WITH BIOPSY;  Surgeon: [name], MD;  Location: [facility];  Service: ENT;  Laterality: N/A;  . PROSTATE BIOPSY         Family History  Problem Relation Age of Onset  . Diabetes Mother   . Diabetes Maternal Grandmother   . Throat cancer Brother 64  . Kidney disease Sister        Dialysis    Social History   Tobacco Use  . Smoking status: Former Smoker  Packs/day: 0.30    Years: 30.00    Pack years: 9.00    Types: Cigarettes    Quit date: 03/19/2016    Years since quitting: 3.7  . Smokeless tobacco: Never Used  Substance Use Topics  . Alcohol use: Yes    Alcohol/week: 120.0 standard drinks    Types: 84 Cans of beer, 36 Shots of liquor per week    Comment: The patient drank a 12 pack of beer a day and 1 pint of liquor every other day. He stopped on 03/14/16 when he was admitted to the hospital.  . Drug use: No    Comment: Hx of cocaine abuse over 30 years ago    Home Medications Prior to Admission medications    Medication Sig Start Date End Date Taking? Authorizing Provider  albuterol (PROVENTIL) (2.5 MG/3ML) 0.083% nebulizer solution Take 3 mLs (2.5 mg total) by nebulization every 6 (six) hours as needed for wheezing or shortness of breath. 03/19/16  Yes Florencia Reasons, MD  ferrous sulfate 325 (65 FE) MG tablet Take 325 mg by mouth daily with breakfast.   Yes [provider]  allopurinol (ZYLOPRIM) 100 MG tablet Take 1 tablet (100 mg total) by mouth daily. 09/20/16   [name], FNP  amLODipine (NORVASC) 10 MG tablet TAKE 1 TABLET(10 MG) BY MOUTH DAILY Patient not taking: Reported on 12/24/2019 03/13/17   [name], FNP  amLODipine (NORVASC) 10 MG tablet TAKE 1 TABLET(10 MG) BY MOUTH DAILY Patient taking differently: Take 10 mg by mouth daily.  04/18/17   [name], FNP  aspirin 81 MG chewable tablet Chew 81 mg by mouth daily.    [provider]  cyclobenzaprine (FLEXERIL) 5 MG tablet TAKE 1 TABLET(5 MG) BY MOUTH THREE TIMES DAILY AS NEEDED FOR MUSCLE SPASMS Patient taking differently: Take 5 mg by mouth 3 (three) times daily as needed for muscle spasms.  05/29/17   [name], FNP  feeding supplement, ENSURE ENLIVE, (ENSURE ENLIVE) LIQD Take 237 mLs by mouth 3 (three) times daily between meals. 03/19/16   Florencia Reasons, MD  Multiple Vitamin (MULTIVITAMIN) tablet Take 1 tablet by mouth daily.    [provider]  NEOMYCIN-POLYMYXIN-HYDROCORTISONE (CORTISPORIN) 1 % SOLN OTIC solution Place 3 drops into the left ear 4 (four) times daily. 04/28/17   Biagio Borg, MD  potassium chloride (K-DUR) 10 MEQ tablet Take 1 tablet (10 mEq total) by mouth daily. 10/27/16   [name], FNP  propranolol (INDERAL) 10 MG tablet TAKE 1 TABLET(10 MG) BY MOUTH TWICE DAILY Patient taking differently: Take 10 mg by mouth 2 (two) times daily.  10/27/16   [name], FNP  sucralfate (CARAFATE) 1 g tablet Take 1 g by mouth 2 (two) times daily. 09/26/19   [provider]   traZODone (DESYREL) 50 MG tablet Take 0.5-1 tablets (25-50 mg total) by mouth at bedtime as needed for sleep. 10/27/16   [name], FNP    Allergies    Penicillins  Review of Systems   Review of Systems  Unable to perform ROS: Mental status change    Physical Exam Updated Vital Signs BP 94/64   Pulse 93   Temp (!) 93 F (33.9 C)   Resp 12   Ht 5\' 6"  (1.676 m)   Wt 45.4 kg   SpO2 100%   BMI 16.14 kg/m   Physical Exam Vitals and nursing note reviewed.  Constitutional:      Appearance: He is  well-developed. He is cachectic. He is ill-appearing.  HENT:     Head: Normocephalic and atraumatic.     Right Ear: External ear normal.     Left Ear: External ear normal.     Nose: Nose normal.  Eyes:     General:        Right eye: No discharge.        Left eye: No discharge.  Neck:     Comments: Trach site has crusting Cardiovascular:     Rate and Rhythm: Normal rate and regular rhythm.     Pulses:          Femoral pulses are 1+ on the right side and 1+ on the left side.    Heart sounds: Normal heart sounds.  Pulmonary:     Effort: Pulmonary effort is normal.     Breath sounds: Normal breath sounds.  Abdominal:     General: There is no distension.     Palpations: Abdomen is soft.     Tenderness: There is no abdominal tenderness.  Genitourinary:    Comments: Glans penis and distal penis appear friable but not tender when palpated There appears to be some drainage coming from scrotum Musculoskeletal:     Cervical back: Neck supple.     Right lower leg: No edema.     Left lower leg: No edema.  Skin:    General: Skin is warm and dry.     Comments: Cool extremities  Neurological:     Mental Status: He is alert.     Comments: Awake, will squeeze hands on command and reacts to pain. However otherwise does not speak or often seem to hear when talking to him  Psychiatric:        Mood and Affect: Mood is not anxious.     ED Results / Procedures / Treatments    Labs (all labs ordered are listed, but only abnormal results are displayed) Labs Reviewed  LACTIC ACID, PLASMA - Abnormal; Notable for the following components:      Result Value   Lactic Acid, Venous 3.6 (*)    All other components within normal limits  LACTIC ACID, PLASMA - Abnormal; Notable for the following components:   Lactic Acid, Venous 2.6 (*)    All other components within normal limits  COMPREHENSIVE METABOLIC PANEL - Abnormal; Notable for the following components:   Sodium 134 (*)    Potassium 3.0 (*)    Chloride 88 (*)    Glucose, Bld 107 (*)    BUN 97 (*)    Creatinine, Ser 3.09 (*)    Albumin 3.4 (*)    AST 125 (*)    ALT 78 (*)    Alkaline Phosphatase 189 (*)    Total Bilirubin 2.5 (*)    GFR calc non Af Amer 21 (*)    GFR calc Af Amer 24 (*)    Anion gap 24 (*)    All other components within normal limits  CBC WITH DIFFERENTIAL/PLATELET - Abnormal; Notable for the following components:   RBC 3.24 (*)    Hemoglobin 10.9 (*)    HCT 31.6 (*)    Platelets 91 (*)    nRBC 1.4 (*)    Lymphs Abs 0.2 (*)    All other components within normal limits  APTT - Abnormal; Notable for the following components:   aPTT 37 (*)    All other components within normal limits  LIPASE, BLOOD - Abnormal; Notable for the following components:  Lipase 2,648 (*)    All other components within normal limits  TSH - Abnormal; Notable for the following components:   TSH 37.518 (*)    All other components within normal limits  I-STAT CHEM 8, ED - Abnormal; Notable for the following components:   Sodium 129 (*)    Potassium 3.0 (*)    Chloride 90 (*)    BUN 91 (*)    Creatinine, Ser 2.80 (*)    Glucose, Bld 102 (*)    Calcium, Ion 1.14 (*)    Hemoglobin 11.9 (*)    HCT 35.0 (*)    All other components within normal limits  POCT I-STAT EG7 - Abnormal; Notable for the following components:   pCO2, Ven 34.9 (*)    Sodium 130 (*)    Potassium 3.0 (*)    Calcium, Ion 1.13 (*)     HCT 33.0 (*)    Hemoglobin 11.2 (*)    All other components within normal limits  RESPIRATORY PANEL BY RT PCR (FLU A&B, COVID)  CULTURE, BLOOD (ROUTINE X 2)  CULTURE, BLOOD (ROUTINE X 2)  URINE CULTURE  PROTIME-INR  ETHANOL  T4, FREE  URINALYSIS, ROUTINE W REFLEX MICROSCOPIC  HIV ANTIBODY (ROUTINE TESTING W REFLEX)  CORTISOL  BASIC METABOLIC PANEL  RAPID URINE DRUG SCREEN, HOSP PERFORMED  LACTIC ACID, PLASMA  LACTIC ACID, PLASMA    EKG EKG Interpretation  Date/Time:  Sunday December 22 2019 12:06:09 EDT Ventricular Rate:  139 PR Interval:    QRS Duration: 82 QT Interval:  428 QTC Calculation: 651 R Axis:   0 Text Interpretation: ** Critical Test Result: Long QTc Atrial fibrillation with rapid ventricular response with premature ventricular or aberrantly conducted complexes Low voltage QRS Possible Lateral infarct , age undetermined Abnormal ECG Poor data quality in current ECG precludes serial comparison Confirmed by Sherwood Gambler [phone]) on 12/15/2019 1:01:17 PM   Radiology DG Chest Port 1 View  Result Date: 12/24/2019 CLINICAL DATA:  Hypotension, sepsis. EXAM: PORTABLE CHEST 1 VIEW COMPARISON:  Chest x-ray dated 08/04/2004 and chest x-ray dated 03/14/2016. FINDINGS: Heart size and mediastinal contours are within normal limits. Lungs are clear. No pleural effusion is seen. Osseous structures about the chest are unremarkable. IMPRESSION: No active disease. No evidence of pneumonia or pulmonary edema. Electronically Signed   By: Franki Cabot M.D.   On: 12/13/2019 13:20   DG Abd Portable 1 View  Result Date: 12/12/2019 CLINICAL DATA:  Vomiting, sepsis. EXAM: PORTABLE ABDOMEN - 1 VIEW COMPARISON:  None. FINDINGS: No dilated large or small bowel loops are identified. Patulous small-bowel loops are seen within the upper abdomen. No evidence of free intraperitoneal air. No evidence of abnormal fluid collection. No evidence of renal or ureteral calculi. Osseous structures are  unremarkable. IMPRESSION: 1. Nonobstructive bowel gas pattern. 2. Patulous loops of small bowel in the upper abdomen raising the possibility of small bowel enteritis. Electronically Signed   By: Franki Cabot M.D.   On: 12/21/2019 13:22    Procedures .Critical Care Performed by: Sherwood Gambler, MD Authorized by: Sherwood Gambler, MD   Critical care provider statement:    Critical care time (minutes):  60   Critical care time was exclusive of:  Separately billable procedures and treating other patients   Critical care was necessary to treat or prevent imminent or life-threatening deterioration of the following conditions:  Sepsis and renal failure   Critical care was time spent personally by me on the following activities:  Discussions with consultants,  evaluation of patient's response to treatment, examination of patient, ordering and performing treatments and interventions, ordering and review of laboratory studies, ordering and review of radiographic studies, pulse oximetry, re-evaluation of patient's condition, obtaining history from patient or surrogate and review of old charts   (including critical care time)  Medications Ordered in ED Medications  vancomycin variable dose per unstable renal function (pharmacist dosing) (has no administration in time range)  ceFEPIme (MAXIPIME) 2 g in sodium chloride 0.9 % 100 mL IVPB (has no administration in time range)  potassium chloride 10 mEq in 100 mL IVPB (has no administration in time range)  docusate sodium (COLACE) capsule 100 mg (has no administration in time range)  polyethylene glycol (MIRALAX / GLYCOLAX) packet 17 g (has no administration in time range)  heparin injection 5,000 Units (has no administration in time range)  acetaminophen (TYLENOL) tablet 650 mg (has no administration in time range)  lactated ringers bolus 500 mL (0 mLs Intravenous Stopped 12/12/2019 1314)  metroNIDAZOLE (FLAGYL) IVPB 500 mg (0 mg Intravenous Stopped 12/15/2019  1255)  vancomycin (VANCOCIN) IVPB 1000 mg/200 mL premix (0 mg Intravenous Stopped 12/11/2019 1359)  ceFEPIme (MAXIPIME) 2 g in sodium chloride 0.9 % 100 mL IVPB ( Intravenous Stopped 12/14/2019 1220)  sodium chloride 0.9 % bolus 1,000 mL (0 mLs Intravenous Stopped 12/05/2019 1259)  fentaNYL (SUBLIMAZE) injection 25 mcg (25 mcg Intravenous Given 12/12/2019 1208)  fentaNYL (SUBLIMAZE) injection 50 mcg (50 mcg Intravenous Given 12/31/2019 1343)  lactated ringers bolus 1,000 mL (0 mLs Intravenous Stopped 12/08/2019 1456)    ED Course  I have reviewed the triage vital signs and the nursing notes.  Pertinent labs & imaging results that were available during my care of the patient were reviewed by me and considered in my medical decision making (see chart for details).    MDM Rules/Calculators/A&P                      Patient presents in shock.  Cool extremities and significant hypotension.  He is later found to have severe hypothermia to around 82 Fahrenheit.  Bahr hugger applied.  Otherwise he was treated per sepsis protocol with IV fluids.  He is found to have significant acute kidney injury with poor urine output through Foley catheter.  Given more fluids as it turns out this is probably acute pancreatitis causing volume depletion.  I think antibiotics are still warranted for now but more likely this is a SIRS response to the pancreatitis.  I discussed with Dr. Redmond Baseman as far as his neck anatomy and he states that his only airway is that stoma and it would be fine to put an ET tube in.  He does have significant solid material in his stoma that I am unable to physically remove.  However if thankfully the patient does not require emergent airway management.  His agitation has been controlled after being warmed up and given small boluses of fentanyl.  He is overall quite complex and given the hypotension, lactic acidosis, and potential airway concerns down the road, ICU has been consulted for admission.  Aris Lot was evaluated in Emergency Department on 12/11/2019 for the symptoms described in the history of present illness. He was evaluated in the context of the global COVID-19 pandemic, which necessitated consideration that the patient might be at risk for infection with the SARS-CoV-2 virus that causes COVID-19. Institutional protocols and algorithms that pertain to the evaluation of patients at risk for COVID-19 are in  a state of rapid change based on information released by regulatory bodies including the CDC and federal and state organizations. These policies and algorithms were followed during the patient's care in the ED.  Final Clinical Impression(s) / ED Diagnoses Final diagnoses:  Acute kidney injury ([place])  Acute pancreatitis, unspecified complication status, unspecified pancreatitis type  Delirium  Hypothermia, initial encounter    Rx / DC Orders ED Discharge Orders    None       Sherwood Gambler, MD 12/30/2019 1551

## 2019-12-23 ENCOUNTER — Inpatient Hospital Stay (HOSPITAL_COMMUNITY): Payer: Medicare HMO

## 2019-12-23 DIAGNOSIS — G9340 Encephalopathy, unspecified: Secondary | ICD-10-CM | POA: Diagnosis not present

## 2019-12-23 DIAGNOSIS — T68XXXA Hypothermia, initial encounter: Secondary | ICD-10-CM | POA: Diagnosis not present

## 2019-12-23 DIAGNOSIS — K859 Acute pancreatitis without necrosis or infection, unspecified: Secondary | ICD-10-CM | POA: Diagnosis not present

## 2019-12-23 LAB — PHOSPHORUS: Phosphorus: 1 mg/dL — CL (ref 2.5–4.6)

## 2019-12-23 LAB — LACTIC ACID, PLASMA
Lactic Acid, Venous: 10 mmol/L (ref 0.5–1.9)
Lactic Acid, Venous: 10.8 mmol/L (ref 0.5–1.9)
Lactic Acid, Venous: >11 mmol/L (ref 0.5–1.9)
Lactic Acid, Venous: 10.8 mmol/L (ref 0.5–1.9)
Lactic Acid, Venous: 10 mmol/L (ref 0.5–1.9)
Lactic Acid, Venous: 9.4 mmol/L (ref 0.5–1.9)
Lactic Acid, Venous: 9 mmol/L (ref 0.5–1.9)

## 2019-12-23 LAB — COMPREHENSIVE METABOLIC PANEL
ALT: 45 U/L — ABNORMAL HIGH (ref 0–44)
AST: 83 U/L — ABNORMAL HIGH (ref 15–41)
Albumin: 2.1 g/dL — ABNORMAL LOW (ref 3.5–5.0)
Alkaline Phosphatase: 101 U/L (ref 38–126)
Anion gap: 22 — ABNORMAL HIGH (ref 5–15)
BUN: 75 mg/dL — ABNORMAL HIGH (ref 6–20)
CO2: 10 mmol/L — ABNORMAL LOW (ref 22–32)
Calcium: 7.7 mg/dL — ABNORMAL LOW (ref 8.9–10.3)
Chloride: 103 mmol/L (ref 98–111)
Creatinine, Ser: 2.62 mg/dL — ABNORMAL HIGH (ref 0.61–1.24)
GFR calc Af Amer: 30 mL/min — ABNORMAL LOW (ref >60)
GFR calc non Af Amer: 26 mL/min — ABNORMAL LOW (ref >60)
Glucose, Bld: 68 mg/dL — ABNORMAL LOW (ref 70–99)
Potassium: 3.4 mmol/L — ABNORMAL LOW (ref 3.5–5.1)
Sodium: 135 mmol/L (ref 135–145)
Total Bilirubin: 1.8 mg/dL — ABNORMAL HIGH (ref 0.3–1.2)
Total Protein: 4.4 g/dL — ABNORMAL LOW (ref 6.5–8.1)

## 2019-12-23 LAB — HEPATIC FUNCTION PANEL
ALT: 45 U/L — ABNORMAL HIGH (ref 0–44)
AST: 84 U/L — ABNORMAL HIGH (ref 15–41)
Albumin: 2 g/dL — ABNORMAL LOW (ref 3.5–5.0)
Alkaline Phosphatase: 101 U/L (ref 38–126)
Bilirubin, Direct: 0.8 mg/dL — ABNORMAL HIGH (ref 0.0–0.2)
Indirect Bilirubin: 1.1 mg/dL — ABNORMAL HIGH (ref 0.3–0.9)
Total Bilirubin: 1.9 mg/dL — ABNORMAL HIGH (ref 0.3–1.2)
Total Protein: 4.4 g/dL — ABNORMAL LOW (ref 6.5–8.1)

## 2019-12-23 LAB — CBC
HCT: 19 % — ABNORMAL LOW (ref 39.0–52.0)
HCT: 24 % — ABNORMAL LOW (ref 39.0–52.0)
Hemoglobin: 6.5 g/dL — CL (ref 13.0–17.0)
Hemoglobin: 8.3 g/dL — ABNORMAL LOW (ref 13.0–17.0)
MCH: 33.9 pg (ref 26.0–34.0)
MCH: 31.9 pg (ref 26.0–34.0)
MCHC: 34.2 g/dL (ref 30.0–36.0)
MCHC: 34.6 g/dL (ref 30.0–36.0)
MCV: 99 fL (ref 80.0–100.0)
MCV: 92.3 fL (ref 80.0–100.0)
Platelets: 44 10*3/uL — ABNORMAL LOW (ref 150–400)
Platelets: 36 10*3/uL — ABNORMAL LOW (ref 150–400)
RBC: 1.92 MIL/uL — ABNORMAL LOW (ref 4.22–5.81)
RBC: 2.6 MIL/uL — ABNORMAL LOW (ref 4.22–5.81)
RDW: 15.3 % (ref 11.5–15.5)
RDW: 15.4 % (ref 11.5–15.5)
WBC: 3.2 10*3/uL — ABNORMAL LOW (ref 4.0–10.5)
WBC: 5.5 10*3/uL (ref 4.0–10.5)
nRBC: 1.9 % — ABNORMAL HIGH (ref 0.0–0.2)
nRBC: 0.5 % — ABNORMAL HIGH (ref 0.0–0.2)

## 2019-12-23 LAB — GLUCOSE, CAPILLARY
Glucose-Capillary: 104 mg/dL — ABNORMAL HIGH (ref 70–99)
Glucose-Capillary: 107 mg/dL — ABNORMAL HIGH (ref 70–99)
Glucose-Capillary: 110 mg/dL — ABNORMAL HIGH (ref 70–99)
Glucose-Capillary: 128 mg/dL — ABNORMAL HIGH (ref 70–99)
Glucose-Capillary: 143 mg/dL — ABNORMAL HIGH (ref 70–99)
Glucose-Capillary: 62 mg/dL — ABNORMAL LOW (ref 70–99)
Glucose-Capillary: 69 mg/dL — ABNORMAL LOW (ref 70–99)
Glucose-Capillary: 76 mg/dL (ref 70–99)
Glucose-Capillary: 82 mg/dL (ref 70–99)
Glucose-Capillary: 82 mg/dL (ref 70–99)
Glucose-Capillary: 88 mg/dL (ref 70–99)
Glucose-Capillary: 91 mg/dL (ref 70–99)
Glucose-Capillary: 91 mg/dL (ref 70–99)
Glucose-Capillary: 92 mg/dL (ref 70–99)
Glucose-Capillary: 93 mg/dL (ref 70–99)

## 2019-12-23 LAB — LIPID PANEL
Cholesterol: 83 mg/dL (ref 0–200)
HDL: 35 mg/dL — ABNORMAL LOW (ref >40)
LDL Cholesterol: 39 mg/dL (ref 0–99)
Total CHOL/HDL Ratio: 2.4 RATIO
Triglycerides: 46 mg/dL (ref <150)
VLDL: 9 mg/dL (ref 0–40)

## 2019-12-23 LAB — POCT I-STAT 7, (LYTES, BLD GAS, ICA,H+H)
Acid-base deficit: 13 mmol/L — ABNORMAL HIGH (ref 0.0–2.0)
Bicarbonate: 10.6 mmol/L — ABNORMAL LOW (ref 20.0–28.0)
Calcium, Ion: 1.08 mmol/L — ABNORMAL LOW (ref 1.15–1.40)
HCT: 18 % — ABNORMAL LOW (ref 39.0–52.0)
Hemoglobin: 6.1 g/dL — CL (ref 13.0–17.0)
O2 Saturation: 100 %
Patient temperature: 96.3
Potassium: 3.2 mmol/L — ABNORMAL LOW (ref 3.5–5.1)
Sodium: 134 mmol/L — ABNORMAL LOW (ref 135–145)
TCO2: 11 mmol/L — ABNORMAL LOW (ref 22–32)
pCO2 arterial: 15.1 mmHg — CL (ref 32.0–48.0)
pH, Arterial: 7.45 (ref 7.350–7.450)
pO2, Arterial: 148 mmHg — ABNORMAL HIGH (ref 83.0–108.0)

## 2019-12-23 LAB — ABO/RH: ABO/RH(D): AB POS

## 2019-12-23 LAB — PREPARE RBC (CROSSMATCH)

## 2019-12-23 LAB — URINE CULTURE: Culture: NO GROWTH

## 2019-12-23 LAB — PROTIME-INR
INR: 1.6 — ABNORMAL HIGH (ref 0.8–1.2)
Prothrombin Time: 19 seconds — ABNORMAL HIGH (ref 11.4–15.2)

## 2019-12-23 LAB — MAGNESIUM: Magnesium: 1.5 mg/dL — ABNORMAL LOW (ref 1.7–2.4)

## 2019-12-23 LAB — APTT: aPTT: 90 seconds — ABNORMAL HIGH (ref 24–36)

## 2019-12-23 LAB — LIPASE, BLOOD: Lipase: 2176 U/L — ABNORMAL HIGH (ref 11–51)

## 2019-12-23 LAB — FIBRINOGEN: Fibrinogen: 223 mg/dL (ref 210–475)

## 2019-12-23 LAB — PROCALCITONIN: Procalcitonin: 1.73 ng/mL

## 2019-12-23 MED ORDER — DEXTROSE 50 % IV SOLN
INTRAVENOUS | Status: AC
  Administered 2019-12-23: 50 mL via INTRAVENOUS
  Filled 2019-12-23: qty 50

## 2019-12-23 MED ORDER — ALBUMIN HUMAN 25 % IV SOLN
25.0000 g | Freq: Once | INTRAVENOUS | Status: AC
  Administered 2019-12-23: 16:00:00 25 g via INTRAVENOUS
  Filled 2019-12-23: qty 100

## 2019-12-23 MED ORDER — SODIUM CHLORIDE 0.9 % IV BOLUS
500.0000 mL | Freq: Once | INTRAVENOUS | Status: AC
  Administered 2019-12-23: 11:00:00 500 mL via INTRAVENOUS

## 2019-12-23 MED ORDER — MAGNESIUM SULFATE 2 GM/50ML IV SOLN
2.0000 g | Freq: Once | INTRAVENOUS | Status: AC
  Administered 2019-12-23: 2 g via INTRAVENOUS
  Filled 2019-12-23: qty 50

## 2019-12-23 MED ORDER — SODIUM CHLORIDE 0.9% IV SOLUTION: Freq: Once | INTRAVENOUS | Status: AC

## 2019-12-23 MED ORDER — DEXTROSE-NACL 5-0.9 % IV SOLN: INTRAVENOUS | Status: DC

## 2019-12-23 MED ORDER — SODIUM CHLORIDE 0.9 % IV BOLUS
1000.0000 mL | Freq: Once | INTRAVENOUS | Status: AC
  Administered 2019-12-23: 1000 mL via INTRAVENOUS

## 2019-12-23 MED ORDER — LACTATED RINGERS IV BOLUS
1000.0000 mL | Freq: Once | INTRAVENOUS | Status: AC
  Administered 2019-12-23: 21:00:00 1000 mL via INTRAVENOUS

## 2019-12-23 MED ORDER — SODIUM CHLORIDE 0.9 % IV BOLUS
500.0000 mL | Freq: Once | INTRAVENOUS | Status: AC
  Administered 2019-12-23: 500 mL via INTRAVENOUS

## 2019-12-23 MED ORDER — PRO-STAT SUGAR FREE PO LIQD: 30.0000 mL | Freq: Two times a day (BID) | ORAL | Status: DC

## 2019-12-23 MED ORDER — DEXTROSE 50 % IV SOLN
25.0000 mL | Freq: Once | INTRAVENOUS | Status: AC
  Administered 2019-12-23: 25 mL via INTRAVENOUS

## 2019-12-23 MED ORDER — DEXTROSE 10 % IV SOLN: INTRAVENOUS | Status: AC

## 2019-12-23 MED ORDER — SODIUM BICARBONATE-DEXTROSE 150-5 MEQ/L-% IV SOLN
150.0000 meq | INTRAVENOUS | Status: DC
  Administered 2019-12-23 – 2019-12-24 (×3): 150 meq via INTRAVENOUS
  Filled 2019-12-23 (×5): qty 1000

## 2019-12-23 MED ORDER — POTASSIUM CHLORIDE 10 MEQ/100ML IV SOLN
10.0000 meq | Freq: Once | INTRAVENOUS | Status: AC
  Administered 2019-12-23: 10 meq via INTRAVENOUS
  Filled 2019-12-23: qty 100

## 2019-12-23 MED ORDER — SODIUM BICARBONATE-DEXTROSE 150-5 MEQ/L-% IV SOLN
150.0000 meq | INTRAVENOUS | Status: DC
  Administered 2019-12-23 (×2): 150 meq via INTRAVENOUS
  Filled 2019-12-23 (×4): qty 1000

## 2019-12-23 MED ORDER — VITAL AF 1.2 CAL PO LIQD
1000.0000 mL | ORAL | Status: DC
  Administered 2019-12-23 – 2019-12-28 (×4): 1000 mL
  Filled 2019-12-23 (×2): qty 1000

## 2019-12-23 MED ORDER — DEXTROSE 50 % IV SOLN
INTRAVENOUS | Status: AC
  Filled 2019-12-23: qty 50

## 2019-12-23 MED ORDER — DEXTROSE 50 % IV SOLN: 25.0000 mL | Freq: Once | INTRAVENOUS | Status: AC

## 2019-12-23 MED ORDER — VITAL HIGH PROTEIN PO LIQD: 1000.0000 mL | ORAL | Status: DC

## 2019-12-23 MED ORDER — POTASSIUM PHOSPHATES 15 MMOLE/5ML IV SOLN
20.0000 mmol | Freq: Once | INTRAVENOUS | Status: AC
  Administered 2019-12-23: 20 mmol via INTRAVENOUS
  Filled 2019-12-23: qty 6.67

## 2019-12-23 NOTE — Progress Notes (Signed)
NAME:  Adam Marshall, MRN:  [id number], DOB:  03-Feb-1961, LOS: 1 ADMISSION DATE:  12/15/2019, CONSULTATION DATE:  01/03/2020 REFERRING MD: Dr. 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   Per ED notes, EMS reports that roommate noted AMS with difficulty ambulating to bathroom requiring depends. EMS also reported initial BP of 44/20.   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 Redemonstration of the diffusely edematous appearing pancreas with increasing and now extensive peripancreatic inflammation. Evaluation for pancreatic necrosis is limited in the absence of contrast. 2. There are enlarging peripancreatic fluid collections particularly extending from the anterior pancreatic body and tail as well as a within the pancreaticoduodenal groove, lesser sac, and tracking along the pericolic gutters. Suspect these likely reflect acute peripancreatic collections in the absence of  demonstrable pancreatic necrosis for the modified Lana criteria. 3. Increasing volume loss in both lower lobes, right slightly greater than left, with small bilateral pleural effusions which are new from the comparison exam. Underlying infection/inflammation is not excluded. 4. Mild gallbladder distention with intermediate attenuation (31 HU). Finding compatible with biliary sludge seen on ultrasound. 5. Diffuse gastric antral, duodenal and colonic wall thickening most pronounced in the hepatic and splenic flexures. Findings are favored to represent reactive inflammation secondary to the adjacent pancreatitis. 6. Colonic diverticulosis without evidence of diverticulitis. 7. Mild edematous changes of the external genitalia are favored to reflect volume third-spacing in the absence of additional focal features of infection. Correlate with visual inspection and exam findings as aggressive/necrotizing soft tissue infection is a clinical diagnosis. 8. Aortic Atherosclerosis (ICD10-I70.0).  4/19 Chest XR:  IMPRESSION: No active disease.  4/18 RUQ ABD U/S:  IMPRESSION: Features are suggestive of acute cholecystitis in the appropriate clinical setting though a sonographic Murphy sign was unable to be fully assessed due to an unresponsive patient.  Mild dilatation of the extrahepatic common bile duct, no visible intraductal stones. If there is concern for choledocholithiasis, MRCP could be obtained.  Diffusely increased hepatic echogenicity, most often seen with hepatic steatosis.  Edematous pancreas with surrounding peripancreatic fluid compatible with features of pancreatitis seen on the same day CT.  4/18 CT Head w/o contrast:  IMPRESSION: 1. No acute intracranial findings. 2. Progressive periventricular white matter hypodensity compatible with chronic ischemic microvascular white matter disease. 3. Increased prominence of sulci along with increased  ventricular prominence which is likely ex vacuo.  4/18 CT ABD/PELVIS:  IMPRESSION: 1. Interval acute, uncomplicated pancreatitis. 2. Diffuse low density wall thickening involving the proximal  descending colon and splenic flexure, compatible with inflammatory changes due to the adjacent pancreatitis. 3. Colonic diverticulosis. 4. Sludge in the gallbladder.  It has. 5. Stable bilateral adrenal hyperplasia. 6. Foley catheter in the urinary bladder with a suggestion of moderate diffuse bladder wall thickening, possibly representing changes due to cystitis.  4/18 portable KUB:  IMPRESSION: 1. Nonobstructive bowel gas pattern. 2. Patulous loops of small bowel in the upper abdomen raising the possibility of small bowel enteritis.  4/18 Chest XR:   IMPRESSION: No active disease. No evidence of pneumonia or pulmonary edema.    Micro Data:  4/18  BCx, UCx pending  Antimicrobials:  4/18: Metronidazole x1 4/18: Cefepime >> 4/18: Vancomycin >>  4/19  Interim history/subjective:  Lactate continued to uptrend overnight. Received 1 unit of blood, given hgb of 6.5. Hypotension maintained with fluid resuscitation - no pressors administered. Continues to have AMS and appears to be in some degree of pain and discomfort.   Objective   Blood pressure 114/78, pulse (!) 104, temperature 97.7 F (36.5 C), resp. rate 20, height 5\' 6"  (1.676 m), weight 50.3 kg, SpO2 100 %.    FiO2 (%):  [28 %] 28 %   Intake/Output Summary (Last 24 hours) at 12/23/2019 0903 Last data filed at 12/23/2019 0802 Gross per 24 hour  Intake 5963.77 ml  Output 280 ml  Net 5683.77 ml   Filed Weights   12/30/2019 1156 12/23/19 0456  Weight: 45.4 kg 50.3 kg    Examination: General: Cachectic appearing male lying in bed, wincing in some degree of discomfort. Full body bair hugger in place.  HENT: [place]/AT, EOMI, eyes appear recessed in orbit with surrounding region dry. 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  Lungs: Diminished, appropiate WOB on trach collar.  Cardiovascular: RRR, no murmurs, rubs or gallops. ~3 sec cap refill. Peripheral pulses 2+.  Abdomen: Soft, ND, Tender to palpation and [place] probing.  Extremities: Warm and well perfuse. Upper extremities in soft restraints.  Skin: Dry and scaly. Decreased skin turgor on lower abdomen.   Neuro: Responds to very simple commands (opens eyes), no speech.  GU: No signs of acute inflammation from scrotum or penis, foley catheter in place.   Resolved Hospital Problem list   None  Assessment & Plan:   Altered mental status 2/2 hypovolemic shock in the presence of acute pancreatitis:  - Maintain MAPs >65 - Continue fluid resuscitation as needed - Follow Lactate level - Place Cortrak: past Ligament of Treitz, may be able to start trickle feeds - Pain medication prn - F/u cultures  Pancreatitis: h/o pancreatitis prior to admission. Current EtOh intake unknown. Lipase >2,000. Abdomen appears tender on exam, peripancreatic fluid increasing on CT scan.  - No sign of necrosis on CTs w/o contrast - Consider repeat CT tomorrow afternoon (~48 hrs after initial) - Fluid resuscitation, pain management and feeds per above.   Metabolic acidosis 2/2 Lactic acidosis: Gap currently open. pH WNL due to respiratory compensation.  - Bolus 500 cc NS  - Repeat lactic acid levels as above  Pancytopenia: In setting of systemic inflammation and severe pancreatitis. H/o cirrhosis and malignancy. Initially presented in decreased cells lines, now WBC (5.5) and Hgb (8.3) levels improved. Plts now 36 from 44 - Transfuse Hgb <7  - Will replace Plts if bleeding occurs or PLT <10K - Will require close outpatient follow up given h/o malignancy  Hypothermia: 56 F with Bair hugger in place.  - CTM  Hypokalemia/Hypophosphatemia/hypomagnesemia:  - Daily monitor with BMP, mag, phos - replete prn  Cirrhosis 2/2 h/o EtOH abuse: Quit in  2017. AST/ALT mildly elevated. Pancytopenia. T. Bili elevated. Cannot r/o hepatic encephalopathy.  - CTM  Hypoglycemia Malnutrition:  Chart noted for 30 lb weight loss over the last year. Family reports most of the loss has been recent. Concerning in the setting of h/o malignancy. Social setting unclear.  - Continue D10 gtt, CBG monitoring - Consult dietician s/p Cortrak placement - Close Outpatient f/u  Elevated TSH  Elevated TSH at 37, now 30. Free T4 is normal at 0.7. Likely sick thyroid, will hold  thyroid hormone replacement at this time.     Best practice:  Diet: Tube feeds, Cortrak  Pain/Anxiety/Delirium protocol (if indicated): Prn meds VAP protocol (if indicated): N/A  DVT prophylaxis: holding d/t thrombocytopenia  GI prophylaxis: N/A Glucose control: Hypoglycemic, responding well CTM Mobility: Bedrest Code Status: Full Family Communication: Will reach out to brother today. Disposition: ICU  Labs   CBC: Recent Labs  Lab 12/10/2019 1124 12/19/2019 1142 12/23/19 0029 12/23/19 0039  WBC 5.1  --   --  3.2*  NEUTROABS 4.7  --   --   --   HGB 10.9* 11.9*   11.2* 6.1* 6.5*  HCT 31.6* 35.0*   33.0* 18.0* 19.0*  MCV 97.5  --   --  99.0  PLT 91*  --   --  44*    Basic Metabolic Panel: Recent Labs  Lab 12/24/2019 1124 12/12/2019 1142 12/10/2019 2134 12/23/19 0029 12/23/19 0039  NA 134* 129*   130* 132* 134* 135  K 3.0* 3.0*   3.0* 3.4* 3.2* 3.4*  CL 88* 90* 102  --  103  CO2 22  --  12*  --  10*  GLUCOSE 107* 102* 90  --  68*  BUN 97* 91* 79*  --  75*  CREATININE 3.09* 2.80* 2.59*  --  2.62*  CALCIUM 10.2  --  7.3*  --  7.7*  MG  --   --   --   --  1.5*  PHOS  --   --   --   --  1.0*   GFR: Estimated Creatinine Clearance: 21.6 mL/min (A) (by C-G formula based on SCr of 2.62 mg/dL (H)). Recent Labs  Lab 01/01/2020 1124 12/20/2019 1334 12/29/2019 2134 12/23/19 0039 12/23/19 0117 12/23/19 0245 12/23/19 0652  PROCALCITON  --   --   --  1.73  --   --   --   WBC 5.1   --   --  3.2*  --   --   --   LATICACIDVEN 3.6*   < > 8.3*  --  10.0* 10.8* >11.0*   < > = values in this interval not displayed.    Liver Function Tests: Recent Labs  Lab 12/18/2019 1124 12/23/19 0039  AST 125* 84*   83*  ALT 78* 45*   45*  ALKPHOS 189* 101   101  BILITOT 2.5* 1.9*   1.8*  PROT 7.3 4.4*   4.4*  ALBUMIN 3.4* 2.0*   2.1*   Recent Labs  Lab 01/03/2020 1124 12/23/19 0039  LIPASE 2,648* 2,176*   No results for input(s): AMMONIA in the last 168 hours.  ABG    Component Value Date/Time   PHART 7.450 12/23/2019 0029   PCO2ART 15.1 (LL) 12/23/2019 0029   PO2ART 148.0 (H) 12/23/2019 0029   HCO3 10.6 (L) 12/23/2019 0029   TCO2 11 (L) 12/23/2019  0029   ACIDBASEDEF 13.0 (H) 12/23/2019 0029   O2SAT 100.0 12/23/2019 0029     Coagulation Profile: Recent Labs  Lab 12/25/2019 1124 12/23/19 0245  INR 1.1 1.6*    Cardiac Enzymes: No results for input(s): CKTOTAL, CKMB, CKMBINDEX, TROPONINI in the last 168 hours.  HbA1C: Hemoglobin A1C  Date/Time Value Ref Range Status  05/02/2011 11:52 AM 5.2  Final    CBG: Recent Labs  Lab 12/23/19 0136 12/23/19 0328 12/23/19 0451 12/23/19 0604 12/23/19 0726  GLUCAP 128* 82 92 110* 104*    Review of Systems:   Unable to assess given current mental status.  Past Medical History  He,  has a past medical history of Abnormal LFTs, Cancer ([place]), Chronic back pain, Chronic knee pain, Cirrhosis ([place]), Fatty liver, Gout, Hiatal hernia, Hypertension, Pancreatic lesion, Pancreatitis, and Prostate cancer ([place]).   Surgical History    Past Surgical History:  Procedure Laterality Date   ESOPHAGOGASTRODUODENOSCOPY N/A 03/15/2016   Procedure: ESOPHAGOGASTRODUODENOSCOPY (EGD);  Surgeon: Doran Stabler, MD;  Location: [facility];  Service: Endoscopy;  Laterality: N/A;   KNEE SURGERY     left   PANENDOSCOPY N/A 03/18/2016   Procedure: DIRECT LARYNGOSCOPY, WITH BRONCHOSCOPY, AND ESOPHAGOSCOPY, WITH BIOPSY;  Surgeon: [name], MD;  Location: [facility];  Service: ENT;  Laterality: N/A;   PROSTATE BIOPSY       Social History   reports that he quit smoking about 3 years ago. His smoking use included cigarettes. He has a 9.00 pack-year smoking history. He has never used smokeless tobacco. He reports current alcohol use of about 120.0 standard drinks of alcohol per week. He reports that he does not use drugs.   Family History   His family history includes Diabetes in his maternal grandmother and mother; Kidney disease in his sister; Throat cancer (age of onset: 88) in his brother.   Allergies Allergies  Allergen Reactions   Penicillins Hives and Rash    Has patient had a PCN reaction causing immediate rash, facial/tongue/throat swelling, SOB or lightheadedness with hypotension:Yes Has patient had a PCN reaction causing severe rash involving mucus membranes or skin necrosis:unsure Has patient had a PCN reaction that required hospitalization:No Has patient had a PCN reaction occurring within the last 10 years:No  If all of the above answers are "NO", then may proceed with Cephalosporin use. Has patient had a PCN reaction causing immediate rash, facial/tongue/throat swelling, SOB or lightheadedness with hypotension:Yes Has patient had a PCN reaction causing severe rash involving mucus membranes or skin necrosis:unsure Has patient had a PCN reaction that required hospitalization:No Has patient had a PCN reaction occurring within the last 10 years:No  If all of the above answers are "NO", then may proceed with Cephalosporin use.      Home Medications  Prior to Admission medications   Medication Sig Start Date End Date Taking? Authorizing Provider  albuterol (PROVENTIL) (2.5 MG/3ML) 0.083% nebulizer solution Take 3 mLs (2.5 mg total) by nebulization every 6 (six) hours as needed for wheezing or shortness of breath. 03/19/16   Florencia Reasons, MD  allopurinol (ZYLOPRIM) 100 MG tablet Take 1 tablet (100 mg total) by  mouth daily. 09/20/16   [name], FNP  amLODipine (NORVASC) 10 MG tablet TAKE 1 TABLET(10 MG) BY MOUTH DAILY Patient not taking: Reported on 12/21/2019 03/13/17   [name], FNP  amLODipine (NORVASC) 10 MG tablet TAKE 1 TABLET(10 MG) BY MOUTH DAILY Patient taking differently: Take 10 mg by mouth daily.  04/18/17   [name], FNP  aspirin 81 MG chewable tablet Chew 81 mg by mouth daily.    [provider]  cyclobenzaprine (FLEXERIL) 5 MG tablet TAKE 1 TABLET(5 MG) BY MOUTH THREE TIMES DAILY AS NEEDED FOR MUSCLE SPASMS Patient taking differently: Take 5 mg by mouth 3 (three) times daily as needed for muscle spasms.  05/29/17   [name], FNP  feeding supplement, ENSURE ENLIVE, (ENSURE ENLIVE) LIQD Take 237 mLs by mouth 3 (three) times daily between meals. 03/19/16   Florencia Reasons, MD  ferrous sulfate 325 (65 FE) MG tablet Take 325 mg by mouth daily with breakfast.    [provider]  Multiple Vitamin (MULTIVITAMIN) tablet Take 1 tablet by mouth daily.    [provider]  NEOMYCIN-POLYMYXIN-HYDROCORTISONE (CORTISPORIN) 1 % SOLN OTIC solution Place 3 drops into the left ear 4 (four) times daily. 04/28/17   Biagio Borg, MD  potassium chloride (K-DUR) 10 MEQ tablet Take 1 tablet (10 mEq total) by mouth daily. 10/27/16   [name], FNP  propranolol (INDERAL) 10 MG tablet TAKE 1 TABLET(10 MG) BY MOUTH TWICE DAILY Patient taking differently: Take 10 mg by mouth 2 (two) times daily.  10/27/16   [name], FNP  sucralfate (CARAFATE) 1 g tablet Take 1 g by mouth 2 (two) times daily. 09/26/19   [provider]  traZODone (DESYREL) 50 MG tablet Take 0.5-1 tablets (25-50 mg total) by mouth at bedtime as needed for sleep. 10/27/16   [name], FNP     Critical care time:   This note was initiated by Carolan Shiver. Meda Coffee, MS4  Reviewed and edited by  Pearson Grippe, DO IM PGY-3 Pager: [phone]

## 2019-12-23 NOTE — Progress Notes (Signed)
[place] Progress Note Patient Name: Adam Marshall DOB: [birth year] MRN: [id number]   Date of Service  12/23/2019  HPI/Events of Note  LA still going up. TSH > 30, free t4 ok, could be sick euthyroidism. Get free t3.  Cortisol > 30. Temp improved. K low. Cr stable. Aneuric. Co2 at 12.   Camera: Discussed. Abdomen look not distended and non tender. Scrotum: has dry red ulcer as per bed side RN, could not be visualized properly. Does not look like fournier gangrene.  MAP ok. RR fine, trach. Awake.   eICU Interventions  - NS 500 ml bolus. DC D5LR - D5NS at 200 ml - get ABG - repeat CT abd/pelvis with scrotum. If abnormal call surgery. - is it Hypothyrodisim?. Will see free T3 level.      Intervention Category Major Interventions: Acid-Base disturbance - evaluation and management Intermediate Interventions: Diagnostic test evaluation;Oliguria - evaluation and management  Elmer Sow 12/23/2019, 12:10 AM

## 2019-12-23 NOTE — Plan of Care (Signed)
Plan of care notes  Asked to evaluate patient for possible scrotal abscess or fourineirs gangrene. He does have some breakdown of scroutm and gluteal fold. Looks chonic with no redness. And no noted pain to palpation. Abdominal exam is benign as well. With acidosis worsening and patient with aliguria will put in for bicarb drip. Unsure of etiology of lactic Acidosis. Agree with abdominal imaging.   Newell Coral DO Internal Medicine/Pediatrics Pulmonary and Critical Care Fellow PGY-6

## 2019-12-23 NOTE — Progress Notes (Signed)
[place] Progress Note Patient Name: Adam Marshall DOB: [birth year] MRN: [id number]   Date of Service  12/23/2019  HPI/Events of Note  Lactic Acid = 9.4 --> 9.0.   eICU Interventions  Will order: 1. Bolus with LR 1 liter IV over 1 hour now.  2. Continue to trend Lactic Acid level.      Intervention Category Major Interventions: Acid-Base disturbance - evaluation and management  Lysle Dingwall 12/23/2019, 8:39 PM

## 2019-12-23 NOTE — Progress Notes (Signed)
Hypoglycemic Event  CBG: 62  Treatment: D50 25 mL (12.5 gm)  Symptoms: None  Follow-up CBG: Time: 1050 CBG Result: 143  Possible Reasons for Event: Inadequate meal intake  Comments/MD notified: Per Md, restart 10% dextrose at 10 ml/hr and give 1/2 amp of 50% dextrose    Olar Santini A

## 2019-12-23 NOTE — Progress Notes (Addendum)
[place] Progress Note Patient Name: Adam Marshall DOB: [birth year] MRN: [id number]   Date of Service  12/23/2019  HPI/Events of Note  ?Mag level of 1.5 , Phos 1.0 from 12MN with GFR 30 lactic up to 10   eICU Interventions  Mag 2 gm IV and K phos 20 mmol IV ordered. Follow Ct abd/pelvis : no fournier , third spacing. Increasing peri pancreatic inflammation/edema, no necrosis. Basal atelectasis/fluid.  No lasix yet. Prn if worsening hypoxemia.      Intervention Category Intermediate Interventions: Electrolyte abnormality - evaluation and management  Elmer Sow 12/23/2019, 3:36 AM

## 2019-12-23 NOTE — Progress Notes (Signed)
ABG drawn, critical values given to RN.

## 2019-12-23 NOTE — Progress Notes (Signed)
CRITICAL VALUE ALERT  Critical Value: Hgb 6.5  Date & Time Notied:  12/23/19 1:50 AM   Provider Notified: elink  Orders Received/Actions taken: see orders

## 2019-12-23 NOTE — Progress Notes (Signed)
[place] Progress Note Patient Name: Adam Marshall DOB: [birth year] MRN: [id number]   Date of Service  12/23/2019  HPI/Events of Note  Pancytopenic and Hg dropped from 11.9 on from 74 th. Cirrhosis. No active bleeding seen. HR stable. MAP ok. No pressors.   eICU Interventions  - stool ocult blood. - PRBC 1 unit. - go for scan. Elevated LA could be from HRS/delayed clearance.  - hold sq heparin for. SCd for now.      Intervention Category Intermediate Interventions: Other:  Elmer Sow 12/23/2019, 2:00 AM

## 2019-12-23 NOTE — Progress Notes (Signed)
Cortrak Tube Team Note:  Received page from RN requesting tube advancement. MD requesting that tube be post-pyloric, ideally LOT.   RD unclipped bridle and tube was successfully advanced. Cortrak tube bridled at >100 cm.   X-ray is required, abdominal x-ray has been ordered by the Cortrak team. Please confirm tube placement before using the Cortrak tube.   If the tube becomes dislodged please keep the tube and contact the Cortrak team at [email or web address] (password [email or web address]) for replacement.  If after hours and replacement cannot be delayed, place a NG tube and confirm placement with an abdominal x-ray.    Kerman Passey MS, RDN, LDN, CNSC RD Pager Number and Weekend/On-Call After Hours Pager Located in [place]

## 2019-12-23 NOTE — Procedures (Signed)
Cortrak  Person Inserting Tube:  Esaw Dace, RD Tube Type:  Cortrak - 43 inches Tube Location:  Left nare Initial Placement:  Stomach Secured by: Bridle Technique Used to Measure Tube Placement:  Documented cm marking at nare/ corner of mouth Cortrak Secured At:  67 cm    Cortrak Tube Team Note:  Consult received to place a Cortrak feeding tube.   No x-ray is required. RN may begin using tube.   If the tube becomes dislodged please keep the tube and contact the Cortrak team at [email or web address] (password [email or web address]) for replacement.  If after hours and replacement cannot be delayed, place a NG tube and confirm placement with an abdominal x-ray.   Kerman Passey MS, RDN, LDN, CNSC RD Pager Number and Weekend/On-Call After Hours Pager Located in [place]

## 2019-12-23 NOTE — Progress Notes (Signed)
Hypoglycemic Event  CBG: 69    Treatment: D50 25 mL (12.5 gm)  Symptoms: None  Follow-up CBG: Time:0130 CBG Result:128  Possible Reasons for Event: Inadequate meal intake     Adam Marshall

## 2019-12-23 NOTE — Progress Notes (Signed)
CRITICAL VALUE ALERT  Critical Value:  Phos <1  Date & Time Notied:  12/23/19 3:23 AM   Provider Notified: elink  Orders Received/Actions taken: see orders

## 2019-12-23 NOTE — Progress Notes (Signed)
[place] Progress Note Patient Name: Adam Marshall DOB: 1961-06-01 MRN: [id number]   Date of Service  12/23/2019  HPI/Events of Note  ABG severe metabolic acidosis with resp compensation. CT abd still pnd. Meantime, Dr Audelia Hives seen him at bed side. agrees for the plan. No clear source for elevated LA.   eICU Interventions  - fluids adjusted , on bicarb gtt. Follow LA.      Intervention Category Major Interventions: Acid-Base disturbance - evaluation and management  Elmer Sow 12/23/2019, 1:40 AM

## 2019-12-23 NOTE — Progress Notes (Addendum)
Initial Nutrition Assessment  RD working remotely.  DOCUMENTATION CODES:   Underweight  INTERVENTION:   Initiate Vital AF 1.2 @ 20 ml/hr via cortrak and increase by 10 ml every 12 hours to goal rate of 70 ml/hr.   Tube feeding regimen provides 2016 kcal 100*% of needs), 126 grams of protein, and 1362 ml of H2O.   -Monitor Mg, K, and Phos daily and replete as necessary; pt is at high risk for refeeding syndrome  NUTRITION DIAGNOSIS:   Inadequate oral intake related to inability to eat as evidenced by NPO status.  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 Last seen by ENT 02/2019 with no evidence of recurrence and appears lost to follow-up. Past medical history is also significant for prostate cancer s/p RT, alcoholic cirrhosis and hx pancreatitis. Admitted for septic shock secondary to acute pancreatitis vs GI source. Currently protecting airway.  Pt admitted with AMS secondary to hypovolemic shock in the presence of acute pancreatitis.   4/19- cortak tube placed, tip of tube confirmed in ligament of treitz  Reviewed I/O's: +5.4 L x 24 hours  UOP: 280 ml x 24 hours  Given underweight status, highly suspect malnutrition, however, unable to to assess at this time.   Medications reviewed and include dextrose 10% infusion @ 10 ml/hr, dextrose 5%-0.9% sodium chloride infusion @ 50 ml/hr, and soidum bicarbonate 140 mEq in dextrose 5% 1000 ml infusion @ 125 ml/hr.   Labs reviewed: CBGS: 82-143.   Diet Order:   Diet Order            Diet NPO time  specified  Diet effective now              EDUCATION NEEDS:   No education needs have been identified at this time  Skin:  Skin Assessment: Skin Integrity Issues: Skin Integrity Issues:: Other (Comment) Other: non-pressure wound to scrotum and penis  Last BM:  Unknown  Height:   Ht Readings from Last 1 Encounters:  12/21/2019 5\' 6"  (1.676 m)    Weight:   Wt Readings from Last 1 Encounters:  12/23/19 50.3 kg    Ideal Body Weight:  59.1 kg  BMI:  Body mass index is 17.9 kg/m.  Estimated Nutritional Needs:   Kcal:  2000-2200  Protein:  110-125 grams  Fluid:  > 2 L    Loistine Chance, RD, LDN, [place] Registered Dietitian II Certified Diabetes Care and Education Specialist Please refer to [facility] for RD and/or RD on-call/weekend/after hours pager

## 2019-12-24 ENCOUNTER — Inpatient Hospital Stay (HOSPITAL_COMMUNITY): Payer: Medicare HMO

## 2019-12-24 DIAGNOSIS — K859 Acute pancreatitis without necrosis or infection, unspecified: Secondary | ICD-10-CM | POA: Diagnosis not present

## 2019-12-24 DIAGNOSIS — G9340 Encephalopathy, unspecified: Secondary | ICD-10-CM | POA: Diagnosis not present

## 2019-12-24 LAB — BASIC METABOLIC PANEL
Anion gap: 16 — ABNORMAL HIGH (ref 5–15)
Anion gap: 14 (ref 5–15)
BUN: 49 mg/dL — ABNORMAL HIGH (ref 6–20)
BUN: 39 mg/dL — ABNORMAL HIGH (ref 6–20)
CO2: 21 mmol/L — ABNORMAL LOW (ref 22–32)
CO2: 27 mmol/L (ref 22–32)
Calcium: 6.9 mg/dL — ABNORMAL LOW (ref 8.9–10.3)
Calcium: 6.4 mg/dL — CL (ref 8.9–10.3)
Chloride: 100 mmol/L (ref 98–111)
Chloride: 96 mmol/L — ABNORMAL LOW (ref 98–111)
Creatinine, Ser: 2.03 mg/dL — ABNORMAL HIGH (ref 0.61–1.24)
Creatinine, Ser: 1.82 mg/dL — ABNORMAL HIGH (ref 0.61–1.24)
GFR calc Af Amer: 40 mL/min — ABNORMAL LOW (ref >60)
GFR calc Af Amer: 46 mL/min — ABNORMAL LOW (ref >60)
GFR calc non Af Amer: 35 mL/min — ABNORMAL LOW (ref >60)
GFR calc non Af Amer: 40 mL/min — ABNORMAL LOW (ref >60)
Glucose, Bld: 113 mg/dL — ABNORMAL HIGH (ref 70–99)
Glucose, Bld: 146 mg/dL — ABNORMAL HIGH (ref 70–99)
Potassium: 2.4 mmol/L — CL (ref 3.5–5.1)
Potassium: 2.8 mmol/L — ABNORMAL LOW (ref 3.5–5.1)
Sodium: 137 mmol/L (ref 135–145)
Sodium: 137 mmol/L (ref 135–145)

## 2019-12-24 LAB — POCT I-STAT 7, (LYTES, BLD GAS, ICA,H+H)
Acid-Base Excess: 2 mmol/L (ref 0.0–2.0)
Acid-Base Excess: 15 mmol/L — ABNORMAL HIGH (ref 0.0–2.0)
Bicarbonate: 30.9 mmol/L — ABNORMAL HIGH (ref 20.0–28.0)
Bicarbonate: 38.2 mmol/L — ABNORMAL HIGH (ref 20.0–28.0)
Calcium, Ion: 0.9 mmol/L — ABNORMAL LOW (ref 1.15–1.40)
Calcium, Ion: 0.82 mmol/L — CL (ref 1.15–1.40)
HCT: 26 % — ABNORMAL LOW (ref 39.0–52.0)
HCT: 22 % — ABNORMAL LOW (ref 39.0–52.0)
Hemoglobin: 8.8 g/dL — ABNORMAL LOW (ref 13.0–17.0)
Hemoglobin: 7.5 g/dL — ABNORMAL LOW (ref 13.0–17.0)
O2 Saturation: 61 %
O2 Saturation: 100 %
Potassium: 3.5 mmol/L (ref 3.5–5.1)
Potassium: 3 mmol/L — ABNORMAL LOW (ref 3.5–5.1)
Sodium: 138 mmol/L (ref 135–145)
Sodium: 138 mmol/L (ref 135–145)
TCO2: 33 mmol/L — ABNORMAL HIGH (ref 22–32)
TCO2: 39 mmol/L — ABNORMAL HIGH (ref 22–32)
pCO2 arterial: 81.2 mmHg (ref 32.0–48.0)
pCO2 arterial: 38.9 mmHg (ref 32.0–48.0)
pH, Arterial: 7.189 — CL (ref 7.350–7.450)
pH, Arterial: 7.6 — ABNORMAL HIGH (ref 7.350–7.450)
pO2, Arterial: 41 mmHg — ABNORMAL LOW (ref 83.0–108.0)
pO2, Arterial: 309 mmHg — ABNORMAL HIGH (ref 83.0–108.0)

## 2019-12-24 LAB — CBC WITH DIFFERENTIAL/PLATELET
Abs Immature Granulocytes: 0.05 10*3/uL (ref 0.00–0.07)
Basophils Absolute: 0 10*3/uL (ref 0.0–0.1)
Basophils Relative: 1 %
Eosinophils Absolute: 0 10*3/uL (ref 0.0–0.5)
Eosinophils Relative: 0 %
HCT: 22.3 % — ABNORMAL LOW (ref 39.0–52.0)
Hemoglobin: 8 g/dL — ABNORMAL LOW (ref 13.0–17.0)
Immature Granulocytes: 1 %
Lymphocytes Relative: 3 %
Lymphs Abs: 0.2 10*3/uL — ABNORMAL LOW (ref 0.7–4.0)
MCH: 32 pg (ref 26.0–34.0)
MCHC: 35.9 g/dL (ref 30.0–36.0)
MCV: 89.2 fL (ref 80.0–100.0)
Monocytes Absolute: 0.2 10*3/uL (ref 0.1–1.0)
Monocytes Relative: 3 %
Neutro Abs: 4.6 10*3/uL (ref 1.7–7.7)
Neutrophils Relative %: 92 %
Platelets: 24 10*3/uL — CL (ref 150–400)
RBC: 2.5 MIL/uL — ABNORMAL LOW (ref 4.22–5.81)
RDW: 16.2 % — ABNORMAL HIGH (ref 11.5–15.5)
WBC: 5 10*3/uL (ref 4.0–10.5)
nRBC: 0.6 % — ABNORMAL HIGH (ref 0.0–0.2)

## 2019-12-24 LAB — PHOSPHORUS
Phosphorus: 1.1 mg/dL — ABNORMAL LOW (ref 2.5–4.6)
Phosphorus: 2.9 mg/dL (ref 2.5–4.6)

## 2019-12-24 LAB — LIPASE, BLOOD: Lipase: 959 U/L — ABNORMAL HIGH (ref 11–51)

## 2019-12-24 LAB — LACTIC ACID, PLASMA
Lactic Acid, Venous: 8.1 mmol/L (ref 0.5–1.9)
Lactic Acid, Venous: 5.9 mmol/L (ref 0.5–1.9)
Lactic Acid, Venous: 4 mmol/L (ref 0.5–1.9)

## 2019-12-24 LAB — GLUCOSE, CAPILLARY
Glucose-Capillary: 125 mg/dL — ABNORMAL HIGH (ref 70–99)
Glucose-Capillary: 133 mg/dL — ABNORMAL HIGH (ref 70–99)
Glucose-Capillary: 140 mg/dL — ABNORMAL HIGH (ref 70–99)
Glucose-Capillary: 143 mg/dL — ABNORMAL HIGH (ref 70–99)
Glucose-Capillary: 151 mg/dL — ABNORMAL HIGH (ref 70–99)

## 2019-12-24 LAB — T3, FREE: T3, Free: 1 pg/mL — ABNORMAL LOW (ref 2.0–4.4)

## 2019-12-24 LAB — MAGNESIUM
Magnesium: 1.6 mg/dL — ABNORMAL LOW (ref 1.7–2.4)
Magnesium: 1.9 mg/dL (ref 1.7–2.4)

## 2019-12-24 LAB — PROCALCITONIN: Procalcitonin: 2.01 ng/mL

## 2019-12-24 MED ORDER — POTASSIUM CHLORIDE 20 MEQ PO PACK
40.0000 meq | PACK | Freq: Four times a day (QID) | ORAL | Status: AC
  Administered 2019-12-24 (×2): 40 meq via ORAL
  Filled 2019-12-24 (×2): qty 2

## 2019-12-24 MED ORDER — THIAMINE HCL 100 MG/ML IJ SOLN
500.0000 mg | Freq: Every day | INTRAVENOUS | Status: AC
  Administered 2019-12-24 – 2019-12-26 (×3): 500 mg via INTRAVENOUS
  Filled 2019-12-24 (×4): qty 5

## 2019-12-24 MED ORDER — FENTANYL CITRATE (PF) 100 MCG/2ML IJ SOLN
INTRAMUSCULAR | Status: AC
  Administered 2019-12-24: 50 ug
  Filled 2019-12-24: qty 2

## 2019-12-24 MED ORDER — IOHEXOL 300 MG/ML  SOLN
100.0000 mL | Freq: Once | INTRAMUSCULAR | Status: AC | PRN
  Administered 2019-12-24: 100 mL via INTRAVENOUS

## 2019-12-24 MED ORDER — SODIUM CHLORIDE 0.9 % IV SOLN: INTRAVENOUS | Status: DC | PRN

## 2019-12-24 MED ORDER — POTASSIUM PHOSPHATES 15 MMOLE/5ML IV SOLN
30.0000 mmol | Freq: Once | INTRAVENOUS | Status: AC
  Administered 2019-12-24: 30 mmol via INTRAVENOUS
  Filled 2019-12-24: qty 10

## 2019-12-24 MED ORDER — MAGNESIUM SULFATE 2 GM/50ML IV SOLN
2.0000 g | Freq: Once | INTRAVENOUS | Status: AC
  Administered 2019-12-24: 2 g via INTRAVENOUS
  Filled 2019-12-24: qty 50

## 2019-12-24 MED ORDER — THIAMINE HCL 100 MG/ML IJ SOLN: 300.0000 mg | Freq: Every day | INTRAVENOUS | Status: DC

## 2019-12-24 MED ORDER — FENTANYL CITRATE (PF) 100 MCG/2ML IJ SOLN: 50.0000 ug | Freq: Once | INTRAMUSCULAR | Status: AC

## 2019-12-24 MED ORDER — LACTATED RINGERS IV BOLUS
1000.0000 mL | Freq: Once | INTRAVENOUS | Status: AC
  Administered 2019-12-24: 1000 mL via INTRAVENOUS

## 2019-12-24 MED ORDER — NOREPINEPHRINE 4 MG/250ML-% IV SOLN
0.0000 ug/min | INTRAVENOUS | Status: DC
  Administered 2019-12-24: 18:00:00 5 ug/min via INTRAVENOUS
  Administered 2019-12-25: 01:00:00 8 ug/min via INTRAVENOUS
  Administered 2019-12-25: 3 ug/min via INTRAVENOUS
  Filled 2019-12-24 (×3): qty 250

## 2019-12-24 NOTE — Progress Notes (Signed)
First bottle of oral contrast given through cortrak. Completed at 2120. Will give 2nd dose at 2220.   Adam Marshall

## 2019-12-24 NOTE — Progress Notes (Signed)
[facility] Progress Note Patient Name: Adam Marshall DOB: 09-26-1960 MRN: [id number]   Date of Service  12/24/2019  HPI/Events of Note  Thrombocytopenia - Platelet count = 91 --> 44 --> 36 --> 24. No evidence of bleeding.   eICU Interventions  Plan: 1. Transfuse platelets only for bleeding or platelet count < 10.     Intervention Category Intermediate Interventions: Thrombocytopenia - evaluation and management  Ahmira Boisselle [place] 12/24/2019, 2:15 AM

## 2019-12-24 NOTE — Procedures (Signed)
Arterial Catheter Insertion Procedure Note JAHEIM LEVERINGTON [id number] 1961-04-23  Procedure: Insertion of Arterial Catheter  Indications: Blood pressure monitoring  Procedure Details Consent: Unable to obtain consent because of emergent medical necessity. Time Out: Verified patient identification, verified procedure, site/side was marked, verified correct patient position, special equipment/implants available, medications/allergies/relevent history reviewed, required imaging and test results available.  Performed  Maximum sterile technique was used including antiseptics, cap, gloves, gown, hand hygiene, mask and sheet. Skin prep: Chlorhexidine; local anesthetic administered 22 gauge catheter was inserted into left radial artery using the Seldinger technique. ULTRASOUND GUIDANCE USED: NO Evaluation Blood flow good; BP tracing good. Complications: No apparent complications.   Einar Grad Mellie Buccellato 12/24/2019

## 2019-12-24 NOTE — Procedures (Signed)
Central Venous Catheter Insertion Procedure Note Adam Marshall [id number] [birth year]  Procedure: Insertion of Central Venous Catheter Indications: Assessment of intravascular volume and Drug and/or fluid administration  Procedure Details Consent: Unable to obtain consent because of emergent medical necessity. Time Out: Verified patient identification, verified procedure, site/side was marked, verified correct patient position, special equipment/implants available, medications/allergies/relevent history reviewed, required imaging and test results available.  Performed  Maximum sterile technique was used including antiseptics, cap, gloves, gown, hand hygiene, mask and sheet. Skin prep: Chlorhexidine; local anesthetic administered A antimicrobial bonded/coated triple lumen catheter was placed in the right subclavian vein using the Seldinger technique. Ultrasound guidance used.Yes.       Catheter placed to 16 cm. Blood aspirated via all 3 ports and then flushed x 3. Line sutured x 2 and dressing applied.  Evaluation Blood flow good Complications: No apparent complications Patient did tolerate procedure well. Chest X-ray ordered to verify placement.  CXR: pending.  Adam Homes, MD  [facility] PGY3  Pager: [phone]

## 2019-12-24 NOTE — Progress Notes (Signed)
Nutrition Follow-up  DOCUMENTATION CODES:   Severe malnutrition in context of chronic illness  INTERVENTION:   Continue Vital AF 1.2 @ 30 ml/hr via cortrak and increase by 10 ml every 12 hours to goal rate of 70 ml/hr.   Tube feeding regimen provides 2016 kcal (100% of needs), 126 grams of protein, and 1362 ml of H2O.   -Monitor Mg, K, and Phos daily and replete as necessary; pt is at high risk for refeeding syndrome  NUTRITION DIAGNOSIS:   Severe Malnutrition related to chronic illness(SCC of pyrifoam sinus s/p laryngectomy and neck dissection) as evidenced by severe fat depletion, severe muscle depletion.  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 Last seen by ENT 02/2019 with no evidence of recurrence and appears lost to follow-up. Past medical history is also significant for prostate cancer s/p RT, alcoholic cirrhosis and hx pancreatitis. Admitted for septic shock secondary to acute pancreatitis vs GI source. Currently protecting airway.  4/19- cortak tube placed, tip of tube confirmed in ligament of treitz; TF initiated 4/20- rectal tube placed  Reviewed I/O's: +5.3 L x 24 hours and +10.6 L since admission  UOP: 3.9 L x 24 hours  Case discussed with RN, who report pt tolerating TF well. She reports trickle feeds were started yesterday and noted plan to increase TF for 10 ml/hr every 12 hours.   Noted Vital AF 1.2 currently infusing via cortrak tube @ 30 ml/hr, Regimen providing 864 kcals, 54 grams protein, and 584 ml free  water, meeting 43% of estimated kcal needs and 49% of estimated protein needs.   Pt tracked RD with eyes and nodded his head to close ended questions. He denies any nausea, abdominal pain, or generalized pain.   Per discussion with RN, pt with very poor social situation PTA; he was living with a friend/cousing who was helping care for him. Pt with very minimal intake over the past 2 weeks, often refusing to eat. He was also wearing adult diapers due to incontinence.   Medications reviewed and include sodium bicarbonate 150 mEq in dextrose 5% 1000 ml infusion @ 125 ml/hr and IV thiamine.   Labs reviewed: K: 2.8, CBGS: 125-140 (inpatient orders for glycemic control are ).   NUTRITION - FOCUSED PHYSICAL EXAM:    Most Recent Value  Orbital Region  Severe depletion  Upper Arm Region  Severe depletion  Thoracic and Lumbar Region  Severe depletion  Buccal Region  Severe depletion  Temple Region  Severe depletion  Clavicle Bone Region  Severe depletion  Clavicle and Acromion Bone Region  Severe depletion  Scapular Bone Region  Severe depletion  Dorsal Hand  Severe depletion  Patellar Region  Moderate depletion  Anterior Thigh Region  Moderate depletion  Posterior Calf Region  Moderate depletion  Edema (RD Assessment)  None  Hair  Reviewed  Eyes  Reviewed  Mouth  Reviewed  Skin  Reviewed  Nails  Reviewed       Diet Order:   Diet Order  Diet NPO time specified  Diet effective now              EDUCATION NEEDS:   No education needs have been identified at this time  Skin:  Skin Assessment: Skin Integrity Issues: Skin Integrity Issues:: Other (Comment) Other: non-pressure wound to scrotum and penis  Last BM:  12/24/19 (via rectal tube)  Height:   Ht Readings from Last 1 Encounters:  12/19/2019 5\' 6"  (1.676 m)    Weight:   Wt Readings from Last 1 Encounters:  12/24/19 55.5 kg    Ideal Body Weight:  59.1 kg  BMI:  Body mass index is 19.75  kg/m.  Estimated Nutritional Needs:   Kcal:  2000-2200  Protein:  110-125 grams  Fluid:  > 2 L    Loistine Chance, RD, LDN, [place] Registered Dietitian II Certified Diabetes Care and Education Specialist Please refer to [facility] for RD and/or RD on-call/weekend/after hours pager

## 2019-12-24 NOTE — Progress Notes (Signed)
Second bottle of oral contrast given through cortrak. Completed at 2235.  Adam Marshall

## 2019-12-24 NOTE — Progress Notes (Signed)
[facility] Progress Note Patient Name: Adam Marshall DOB: 27-May-1961 MRN: [id number]   Date of Service  12/24/2019  HPI/Events of Note  Hypokalemia/Hypomagnesemia/Hypophosphatemia - K+ = 2.4, Mg++ = 1.6 and PO4--- = 1.6. Creatinine = 2.03.   eICU Interventions  Will replace Mg++, K+ and PO4---/     Intervention Category Major Interventions: Electrolyte abnormality - evaluation and management  Timon Geissinger Eugene 12/24/2019, 1:16 AM

## 2019-12-24 NOTE — Consult Note (Addendum)
[facility] Nurse Consult Note: Reason for Consult: Consult requested for penis and scrotum.  Pt was incontinent of urine and stool for several days prior to admission, according to progress notes.  He has full thickness skin loss to posterior scrotum from moisture associated skin damage.  Affected area is approx 5X5X.1cm in patchy areas, red amd moist and painful to touch.  Entire shaft of penis has a large amt edema; tip of penis is red and moist and painful to touch with full thickness skin loss, approx 38m circumferentially.   Dressing procedure/placement/frequency: Topical treatment orders provided for bedside nurses to perform to promote healing as follows: Apply double-folded xeroform gauze to posterior scrotum and tip of penis Q day Discussed plan of care with critical care team; pt could benefit from a urology consult for further assessment and plan of care.  Tip of penis is high risk to evolve into necrosis if edema eventually impairs the blood flow. Please re-consult if further assistance is needed.  Thank-you,  Julien Girt MSN, [place], [place], [place], [place]

## 2019-12-24 NOTE — Progress Notes (Addendum)
[facility] Progress Note Patient Name: Adam Marshall DOB: 09-08-1960 MRN: [id number]   Date of Service  12/24/2019  HPI/Events of Note  Lactic Acid = 9.0 --> 8.1.   eICU Interventions  Will order: 1. Bolus with LR 1 liter IV over 1 hour now.  2. Repeat Lactic Acid at 7:15 AM.      Intervention Category Major Interventions: Acid-Base disturbance - evaluation and management  Katherleen Folkes [place] 12/24/2019, 3:04 AM

## 2019-12-24 NOTE — Progress Notes (Signed)
NAME:  Adam Marshall, MRN:  [id number], DOB:  [birth year], LOS: 2 ADMISSION DATE:  12/09/2019, CONSULTATION DATE:  12/14/2019 REFERRING MD: Dr. 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   Per ED notes, EMS reports that roommate noted AMS with difficulty ambulating to bathroom requiring depends. EMS also reported initial BP of 44/20.   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 Redemonstration of the diffusely edematous appearing pancreas with increasing and now extensive peripancreatic inflammation. Evaluation for pancreatic necrosis is limited in the absence of contrast. 2. There are enlarging peripancreatic fluid collections particularly extending from the anterior pancreatic body and tail as well as a within the pancreaticoduodenal groove, lesser sac, and tracking along the pericolic gutters. Suspect these likely reflect acute peripancreatic collections in the  absence of demonstrable pancreatic necrosis for the modified Lana criteria. 3. Increasing volume loss in both lower lobes, right slightly greater than left, with small bilateral pleural effusions which are new from the comparison exam. Underlying infection/inflammation is not excluded. 4. Mild gallbladder distention with intermediate attenuation (31 HU). Finding compatible with biliary sludge seen on ultrasound. 5. Diffuse gastric antral, duodenal and colonic wall thickening most pronounced in the hepatic and splenic flexures. Findings are favored to represent reactive inflammation secondary to the adjacent pancreatitis. 6. Colonic diverticulosis without evidence of diverticulitis. 7. Mild edematous changes of the external genitalia are favored to reflect volume third-spacing in the absence of additional focal features of infection. Correlate with visual inspection and exam findings as aggressive/necrotizing soft tissue infection is a clinical diagnosis. 8. Aortic Atherosclerosis (ICD10-I70.0).  4/19 Chest XR:  IMPRESSION: No active disease.  4/18 RUQ ABD U/S:  IMPRESSION: Features are suggestive of acute cholecystitis in the appropriate clinical setting though a sonographic Murphy sign was unable to be fully assessed due to an unresponsive patient.  Mild dilatation of the extrahepatic common bile duct, no visible intraductal stones. If there is concern for choledocholithiasis, MRCP could be obtained.  Diffusely increased hepatic echogenicity, most often seen with hepatic steatosis.  Edematous pancreas with surrounding peripancreatic fluid compatible with features of pancreatitis seen on the same day CT.  4/18 CT Head w/o contrast:  IMPRESSION: 1. No acute intracranial findings. 2. Progressive periventricular white matter hypodensity compatible with chronic ischemic microvascular white matter disease. 3. Increased prominence of sulci along with increased  ventricular prominence which is likely ex vacuo.  4/18 CT ABD/PELVIS:  IMPRESSION: 1. Interval acute, uncomplicated pancreatitis. 2. Diffuse low density wall thickening  involving the proximal descending colon and splenic flexure, compatible with inflammatory changes due to the adjacent pancreatitis. 3. Colonic diverticulosis. 4. Sludge in the gallbladder.  It has. 5. Stable bilateral adrenal hyperplasia. 6. Foley catheter in the urinary bladder with a suggestion of moderate diffuse bladder wall thickening, possibly representing changes due to cystitis.  4/18 portable KUB:  IMPRESSION: 1. Nonobstructive bowel gas pattern. 2. Patulous loops of small bowel in the upper abdomen raising the possibility of small bowel enteritis.  4/18 Chest XR:   IMPRESSION: No active disease. No evidence of pneumonia or pulmonary edema.    Micro Data:  4/18  BCx, UCx pending  Antimicrobials:  4/18: Metronidazole x1 4/18: Cefepime >>4/20 4/18: Vancomycin >> 4/19  Interim history/subjective:  Lactate continues to downtrend with fluid resuscitation. Temp has improved along with increased in hemodynamic stability. Urine output and creatinine levels have improved. Appears to be a little more responsive, but mentation still altered. Discomfort noted in abdominal palpation with increased distention, likely from fluid replacement.   Objective   Blood pressure 116/73, pulse (!) 111, temperature 99 F (37.2 C), resp. rate (!) 21, height 5\' 6"  (1.676 m), weight 55.5 kg, SpO2 99 %.    FiO2 (%):  [28 %] 28 %   Intake/Output Summary (Last 24 hours) at 12/24/2019 0728 Last data filed at 12/24/2019 0700 Gross per 24 hour  Intake 9202.15 ml  Output 3885 ml  Net 5317.15 ml   Filed Weights   12/06/2019 1156 12/23/19 0456 12/24/19 0448  Weight: 45.4 kg 50.3 kg 55.5 kg    Examination: General: Cachectic appearing male, lying in bed with Bair hugger in place.  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   Lungs: diminished breath sounds with NWOB on trach collar Cardiovascular: RRR, no murmurs. Peripheral pulses 2+ Abdomen: Firm with increased distention.   Extremities: warm and well perfuse.   Skin: Skin turgor and dryness much improved from 4/19 exam.   Neuro: Continues to respond to simple commands, no speech.     Resolved Hospital Problem list   None  Assessment & Plan:   Altered mental status 2/2 hypovolemic shock in the presence of acute pancreatitis:  - Maintain MAPs >65 - Hold bolus fluids - Continue maintenance fluids - Place Cortrak: past Ligament of Treitz, may be able to start trickle feeds, consult dietitian - Pain medication prn - F/u cultures NGTD @24hr , Abs d/c'd today - CTM, trend lactate levels - D/c'd abx - Thiamine supplementation, 500 IV for 3 days  Pancreatitis: h/o pancreatitis prior to admission. Current EtOh intake unknown. Lipase >2,000. Abdomen appears tender on exam, peripancreatic fluid increasing on CT scan.  - No sign of necrosis on CTs w/o contrast - Consider repeat CT 4/20 (~48 hrs after initial) - Fluid resuscitation, pain management and feeds per above.  - Trend Lipase  Metabolic acidosis 2/2 Lactic acidosis: Gap currently open. pH WNL due to respiratory compensation. Anion gap improving: 22 --> 16 - CTM  Pancytopenia: In setting of systemic inflammation and severe pancreatitis. H/o cirrhosis and malignancy. Initially presented in decreased cells lines, now WBC (5.5) and Hgb (8.3) levels improved. Plts now 24 from 36 - Transfuse Hgb <7  - Will replace Plts if bleeding occurs or PLT <10K - Will require close outpatient follow up given h/o malignancy  Hypothermia: 80 F with Bair hugger in place.  - Hold bair hugger - CTM  Hypokalemia/Hypophosphatemia/hypomagnesemia:  - Daily monitor with BMP, mag, phos - replete  prn  Cirrhosis 2/2 h/o EtOH abuse: Quit in 2017. AST/ALT mildly elevated. Pancytopenia. T. Bili  elevated. Cannot r/o hepatic encephalopathy. - Thiamine 500 mg  - Consider lactulose if mentation does not improve.  - CTM  Hypoglycemia Malnutrition:  Chart noted for 30 lb weight loss over the last year. Family reports most of the loss has been recent. Concerning in the setting of h/o malignancy. Social setting unclear.  - D/c'd D10 gtt, CBG monitoring - Cortrack placed yesterday - Consult dietician tube feeds - Close Outpatient f/u  Elevated TSH  Elevated TSH at 37, now 30. Free T4 is normal at 0.7. Likely sick thyroid, will hold  thyroid hormone replacement at this time.     Best practice:  Diet: Tube feeds, Cortrak  Pain/Anxiety/Delirium protocol (if indicated): Prn meds VAP protocol (if indicated): N/A  DVT prophylaxis: holding d/t thrombocytopenia  GI prophylaxis: N/A Glucose control: Hypoglycemic: improved CTM Mobility: Bedrest Code Status: Full Family Communication: Brother and son updated 4/19 pm Disposition: ICU  Labs   CBC: Recent Labs  Lab 12/24/2019 1124 01/02/2020 1124 12/31/2019 1142 12/23/19 0029 12/23/19 0039 12/23/19 0754 12/24/19 0006  WBC 5.1  --   --   --  3.2* 5.5 5.0  NEUTROABS 4.7  --   --   --   --   --  4.6  HGB 10.9*   < > 11.9*  11.2* 6.1* 6.5* 8.3* 8.0*  HCT 31.6*   < > 35.0*  33.0* 18.0* 19.0* 24.0* 22.3*  MCV 97.5  --   --   --  99.0 92.3 89.2  PLT 91*  --   --   --  44* 36* 24*   < > = values in this interval not displayed.    Basic Metabolic Panel: Recent Labs  Lab 12/06/2019 1124 12/21/2019 1124 12/26/2019 1142 12/08/2019 2134 12/23/19 0029 12/23/19 0039 12/24/19 0006  NA 134*   < > 129*  130* 132* 134* 135 137  K 3.0*   < > 3.0*  3.0* 3.4* 3.2* 3.4* 2.4*  CL 88*  --  90* 102  --  103 100  CO2 22  --   --  12*  --  10* 21*  GLUCOSE 107*  --  102* 90  --  68* 113*  BUN 97*  --  91* 79*  --  75* 49*  CREATININE 3.09*  --  2.80* 2.59*  --  2.62* 2.03*  CALCIUM 10.2  --   --  7.3*  --  7.7* 6.9*  MG  --   --   --   --   --  1.5*  1.6*  PHOS  --   --   --   --   --  1.0* 1.1*   < > = values in this interval not displayed.   GFR: Estimated Creatinine Clearance: 30.8 mL/min (A) (by C-G formula based on SCr of 2.03 mg/dL (H)). Recent Labs  Lab 12/29/2019 1124 12/10/2019 1334 12/23/19 0039 12/23/19 0117 12/23/19 0754 12/23/19 1042 12/23/19 1415 12/23/19 1621 12/23/19 1946 12/24/19 0006  PROCALCITON  --   --  1.73  --   --   --   --   --   --  2.01  WBC 5.1  --  3.2*  --  5.5  --   --   --   --  5.0  LATICACIDVEN 3.6*   < >  --    < >  --    < > 10.0*  9.4* 9.0* 8.1*   < > = values in this interval not displayed.    Liver Function Tests: Recent Labs  Lab 12/23/2019 1124 12/23/19 0039  AST 125* 84*  83*  ALT 78* 45*  45*  ALKPHOS 189* 101  101  BILITOT 2.5* 1.9*  1.8*  PROT 7.3 4.4*  4.4*  ALBUMIN 3.4* 2.0*  2.1*   Recent Labs  Lab 12/08/2019 1124 12/23/19 0039  LIPASE 2,648* 2,176*   No results for input(s): AMMONIA in the last 168 hours.  ABG    Component Value Date/Time   PHART 7.450 12/23/2019 0029   PCO2ART 15.1 (LL) 12/23/2019 0029   PO2ART 148.0 (H) 12/23/2019 0029   HCO3 10.6 (L) 12/23/2019 0029   TCO2 11 (L) 12/23/2019 0029   ACIDBASEDEF 13.0 (H) 12/23/2019 0029   O2SAT 100.0 12/23/2019 0029     Coagulation Profile: Recent Labs  Lab 12/17/2019 1124 12/23/19 0245  INR 1.1 1.6*    Cardiac Enzymes: No results for input(s): CKTOTAL, CKMB, CKMBINDEX, TROPONINI in the last 168 hours.  HbA1C: Hemoglobin A1C  Date/Time Value Ref Range Status  05/02/2011 11:52 AM 5.2  Final    CBG: Recent Labs  Lab 12/23/19 1514 12/23/19 1629 12/23/19 1944 12/23/19 2331 12/24/19 0336  GLUCAP 93 91 91 107* 125*    Review of Systems:   Unable to assess given current mental status.  Past Medical History  He,  has a past medical history of Abnormal LFTs, Cancer ([place]), Chronic back pain, Chronic knee pain, Cirrhosis ([place]), Fatty liver, Gout, Hiatal hernia, Hypertension, Pancreatic lesion,  Pancreatitis, and Prostate cancer ([place]).   Surgical History    Past Surgical History:  Procedure Laterality Date  . ESOPHAGOGASTRODUODENOSCOPY N/A 03/15/2016   Procedure: ESOPHAGOGASTRODUODENOSCOPY (EGD);  Surgeon: Doran Stabler, MD;  Location: [facility];  Service: Endoscopy;  Laterality: N/A;  . KNEE SURGERY     left  . PANENDOSCOPY N/A 03/18/2016   Procedure: DIRECT LARYNGOSCOPY, WITH BRONCHOSCOPY, AND ESOPHAGOSCOPY, WITH BIOPSY;  Surgeon: [name], MD;  Location: [facility];  Service: ENT;  Laterality: N/A;  . PROSTATE BIOPSY       Social History   reports that he quit smoking about 3 years ago. His smoking use included cigarettes. He has a 9.00 pack-year smoking history. He has never used smokeless tobacco. He reports current alcohol use of about 120.0 standard drinks of alcohol per week. He reports that he does not use drugs.   Family History   His family history includes Diabetes in his maternal grandmother and mother; Kidney disease in his sister; Throat cancer (age of onset: 31) in his brother.   Allergies Allergies  Allergen Reactions  . Penicillins Hives and Rash    Has patient had a PCN reaction causing immediate rash, facial/tongue/throat swelling, SOB or lightheadedness with hypotension:Yes Has patient had a PCN reaction causing severe rash involving mucus membranes or skin necrosis:unsure Has patient had a PCN reaction that required hospitalization:No Has patient had a PCN reaction occurring within the last 10 years:No  If all of the above answers are "NO", then may proceed with Cephalosporin use. Has patient had a PCN reaction causing immediate rash, facial/tongue/throat swelling, SOB or lightheadedness with hypotension:Yes Has patient had a PCN reaction causing severe rash involving mucus membranes or skin necrosis:unsure Has patient had a PCN reaction that required hospitalization:No Has patient had a PCN reaction occurring within the last 10 years:No  If all  of the above answers are "NO", then may proceed  with Cephalosporin use.      Home Medications  Prior to Admission medications   Medication Sig Start Date End Date Taking? Authorizing Provider  albuterol (PROVENTIL) (2.5 MG/3ML) 0.083% nebulizer solution Take 3 mLs (2.5 mg total) by nebulization every 6 (six) hours as needed for wheezing or shortness of breath. 03/19/16   Florencia Reasons, MD  allopurinol (ZYLOPRIM) 100 MG tablet Take 1 tablet (100 mg total) by mouth daily. 09/20/16   [name], FNP  amLODipine (NORVASC) 10 MG tablet TAKE 1 TABLET(10 MG) BY MOUTH DAILY Patient not taking: Reported on 12/18/2019 03/13/17   [name], FNP  amLODipine (NORVASC) 10 MG tablet TAKE 1 TABLET(10 MG) BY MOUTH DAILY Patient taking differently: Take 10 mg by mouth daily.  04/18/17   [name], FNP  aspirin 81 MG chewable tablet Chew 81 mg by mouth daily.    [provider]  cyclobenzaprine (FLEXERIL) 5 MG tablet TAKE 1 TABLET(5 MG) BY MOUTH THREE TIMES DAILY AS NEEDED FOR MUSCLE SPASMS Patient taking differently: Take 5 mg by mouth 3 (three) times daily as needed for muscle spasms.  05/29/17   [name], FNP  feeding supplement, ENSURE ENLIVE, (ENSURE ENLIVE) LIQD Take 237 mLs by mouth 3 (three) times daily between meals. 03/19/16   Florencia Reasons, MD  ferrous sulfate 325 (65 FE) MG tablet Take 325 mg by mouth daily with breakfast.    [provider]  Multiple Vitamin (MULTIVITAMIN) tablet Take 1 tablet by mouth daily.    [provider]  NEOMYCIN-POLYMYXIN-HYDROCORTISONE (CORTISPORIN) 1 % SOLN OTIC solution Place 3 drops into the left ear 4 (four) times daily. 04/28/17   Biagio Borg, MD  potassium chloride (K-DUR) 10 MEQ tablet Take 1 tablet (10 mEq total) by mouth daily. 10/27/16   [name], FNP  propranolol (INDERAL) 10 MG tablet TAKE 1 TABLET(10 MG) BY MOUTH TWICE DAILY Patient taking differently: Take 10 mg by mouth 2 (two) times daily.  10/27/16   [name], FNP  sucralfate (CARAFATE) 1 g tablet Take 1 g by mouth 2 (two) times daily. 09/26/19   [provider]  traZODone (DESYREL) 50 MG tablet Take 0.5-1 tablets (25-50 mg total) by mouth at bedtime as needed for sleep. 10/27/16   [name], FNP     Critical care time:   This note was initiated by Carolan Shiver. Meda Coffee, MS4  Reviewed and edited by  Marianna Payment, D.O. [facility], PGY-1 Pager: [phone], Phone: [phone] Date 12/24/2019 Time 11:47 AM

## 2019-12-24 NOTE — Progress Notes (Signed)
CRITICAL VALUE ALERT  Critical Value:  K+ 2.4  Date & Time Notied:  12/24/2019 @ 0103  Provider Notified: Warren Lacy MD  Orders Received/Actions taken: Awaiting orders  Jackalyn Lombard

## 2019-12-24 NOTE — Progress Notes (Signed)
[place] Progress Note Patient Name: Adam Marshall DOB: 1961-07-20 MRN: [id number]   Date of Service  12/24/2019  HPI/Events of Note  ABG on 100%/PRVC 18/TV 510/P 5 = 7.60/38.9/309.0. Patient is currently on a NaHCO3 IV infusion.   eICU Interventions  Will order: 1. D/C NaHCO3 IV infusion.  2. Repeat ABG in AM.     Intervention Category Major Interventions: Acid-Base disturbance - evaluation and management;Respiratory failure - evaluation and management  Lysle Dingwall 12/24/2019, 7:55 PM

## 2019-12-25 DIAGNOSIS — K859 Acute pancreatitis without necrosis or infection, unspecified: Secondary | ICD-10-CM | POA: Diagnosis not present

## 2019-12-25 DIAGNOSIS — N179 Acute kidney failure, unspecified: Secondary | ICD-10-CM | POA: Diagnosis not present

## 2019-12-25 DIAGNOSIS — R579 Shock, unspecified: Secondary | ICD-10-CM | POA: Diagnosis not present

## 2019-12-25 LAB — POCT I-STAT 7, (LYTES, BLD GAS, ICA,H+H)
Acid-Base Excess: 10 mmol/L — ABNORMAL HIGH (ref 0.0–2.0)
Acid-Base Excess: 11 mmol/L — ABNORMAL HIGH (ref 0.0–2.0)
Bicarbonate: 31.8 mmol/L — ABNORMAL HIGH (ref 20.0–28.0)
Bicarbonate: 33.1 mmol/L — ABNORMAL HIGH (ref 20.0–28.0)
Calcium, Ion: 0.78 mmol/L — CL (ref 1.15–1.40)
Calcium, Ion: 0.82 mmol/L — CL (ref 1.15–1.40)
HCT: 22 % — ABNORMAL LOW (ref 39.0–52.0)
HCT: 25 % — ABNORMAL LOW (ref 39.0–52.0)
Hemoglobin: 7.5 g/dL — ABNORMAL LOW (ref 13.0–17.0)
Hemoglobin: 8.5 g/dL — ABNORMAL LOW (ref 13.0–17.0)
O2 Saturation: 99 %
O2 Saturation: 99 %
Patient temperature: 99.2
Patient temperature: 98.1
Potassium: 2.8 mmol/L — ABNORMAL LOW (ref 3.5–5.1)
Potassium: 2.7 mmol/L — CL (ref 3.5–5.1)
Sodium: 135 mmol/L (ref 135–145)
Sodium: 134 mmol/L — ABNORMAL LOW (ref 135–145)
TCO2: 33 mmol/L — ABNORMAL HIGH (ref 22–32)
TCO2: 34 mmol/L — ABNORMAL HIGH (ref 22–32)
pCO2 arterial: 30.2 mmHg — ABNORMAL LOW (ref 32.0–48.0)
pCO2 arterial: 33.7 mmHg (ref 32.0–48.0)
pH, Arterial: 7.631 (ref 7.350–7.450)
pH, Arterial: 7.599 — ABNORMAL HIGH (ref 7.350–7.450)
pO2, Arterial: 110 mmHg — ABNORMAL HIGH (ref 83.0–108.0)
pO2, Arterial: 133 mmHg — ABNORMAL HIGH (ref 83.0–108.0)

## 2019-12-25 LAB — COMPREHENSIVE METABOLIC PANEL
ALT: 38 U/L (ref 0–44)
AST: 55 U/L — ABNORMAL HIGH (ref 15–41)
Albumin: 1.9 g/dL — ABNORMAL LOW (ref 3.5–5.0)
Alkaline Phosphatase: 189 U/L — ABNORMAL HIGH (ref 38–126)
Anion gap: 12 (ref 5–15)
BUN: 29 mg/dL — ABNORMAL HIGH (ref 6–20)
CO2: 29 mmol/L (ref 22–32)
Calcium: 5.8 mg/dL — CL (ref 8.9–10.3)
Chloride: 94 mmol/L — ABNORMAL LOW (ref 98–111)
Creatinine, Ser: 1.53 mg/dL — ABNORMAL HIGH (ref 0.61–1.24)
GFR calc Af Amer: 57 mL/min — ABNORMAL LOW (ref >60)
GFR calc non Af Amer: 49 mL/min — ABNORMAL LOW (ref >60)
Glucose, Bld: 137 mg/dL — ABNORMAL HIGH (ref 70–99)
Potassium: 2.7 mmol/L — CL (ref 3.5–5.1)
Sodium: 135 mmol/L (ref 135–145)
Total Bilirubin: 1.5 mg/dL — ABNORMAL HIGH (ref 0.3–1.2)
Total Protein: 4.4 g/dL — ABNORMAL LOW (ref 6.5–8.1)

## 2019-12-25 LAB — CBC WITH DIFFERENTIAL/PLATELET
Abs Immature Granulocytes: 0.04 10*3/uL (ref 0.00–0.07)
Basophils Absolute: 0 10*3/uL (ref 0.0–0.1)
Basophils Relative: 0 %
Eosinophils Absolute: 0 10*3/uL (ref 0.0–0.5)
Eosinophils Relative: 1 %
HCT: 21.6 % — ABNORMAL LOW (ref 39.0–52.0)
Hemoglobin: 7.6 g/dL — ABNORMAL LOW (ref 13.0–17.0)
Immature Granulocytes: 1 %
Lymphocytes Relative: 3 %
Lymphs Abs: 0.2 10*3/uL — ABNORMAL LOW (ref 0.7–4.0)
MCH: 32.5 pg (ref 26.0–34.0)
MCHC: 35.2 g/dL (ref 30.0–36.0)
MCV: 92.3 fL (ref 80.0–100.0)
Monocytes Absolute: 0.3 10*3/uL (ref 0.1–1.0)
Monocytes Relative: 4 %
Neutro Abs: 5.1 10*3/uL (ref 1.7–7.7)
Neutrophils Relative %: 91 %
Platelets: 13 10*3/uL — CL (ref 150–400)
RBC: 2.34 MIL/uL — ABNORMAL LOW (ref 4.22–5.81)
RDW: 16.4 % — ABNORMAL HIGH (ref 11.5–15.5)
WBC: 5.6 10*3/uL (ref 4.0–10.5)
nRBC: 0 % (ref 0.0–0.2)

## 2019-12-25 LAB — GLUCOSE, CAPILLARY
Glucose-Capillary: 109 mg/dL — ABNORMAL HIGH (ref 70–99)
Glucose-Capillary: 118 mg/dL — ABNORMAL HIGH (ref 70–99)
Glucose-Capillary: 120 mg/dL — ABNORMAL HIGH (ref 70–99)
Glucose-Capillary: 121 mg/dL — ABNORMAL HIGH (ref 70–99)
Glucose-Capillary: 123 mg/dL — ABNORMAL HIGH (ref 70–99)
Glucose-Capillary: 125 mg/dL — ABNORMAL HIGH (ref 70–99)
Glucose-Capillary: 127 mg/dL — ABNORMAL HIGH (ref 70–99)

## 2019-12-25 LAB — TECHNOLOGIST SMEAR REVIEW: Plt Morphology: DECREASED

## 2019-12-25 LAB — PROCALCITONIN: Procalcitonin: 2.67 ng/mL

## 2019-12-25 LAB — LACTIC ACID, PLASMA: Lactic Acid, Venous: 2 mmol/L (ref 0.5–1.9)

## 2019-12-25 MED ORDER — SODIUM CHLORIDE 0.9 % IV SOLN
1.0000 g | Freq: Two times a day (BID) | INTRAVENOUS | Status: DC
  Administered 2019-12-25 – 2019-12-27 (×4): 1 g via INTRAVENOUS
  Filled 2019-12-25 (×5): qty 1

## 2019-12-25 MED ORDER — ACETAMINOPHEN 325 MG PO TABS: 650.0000 mg | ORAL_TABLET | ORAL | Status: DC | PRN

## 2019-12-25 MED ORDER — SODIUM CHLORIDE 0.9 % IV SOLN
1.0000 g | Freq: Three times a day (TID) | INTRAVENOUS | Status: DC
  Filled 2019-12-25 (×2): qty 1

## 2019-12-25 MED ORDER — DOCUSATE SODIUM 50 MG/5ML PO LIQD
100.0000 mg | Freq: Two times a day (BID) | ORAL | Status: DC | PRN
  Filled 2019-12-25: qty 10

## 2019-12-25 MED ORDER — POTASSIUM CHLORIDE 20 MEQ/15ML (10%) PO SOLN
40.0000 meq | ORAL | Status: AC
  Administered 2019-12-25: 40 meq
  Filled 2019-12-25: qty 30

## 2019-12-25 MED ORDER — POTASSIUM CHLORIDE 20 MEQ/15ML (10%) PO SOLN
40.0000 meq | ORAL | Status: DC
  Administered 2019-12-25: 40 meq via ORAL
  Filled 2019-12-25: qty 30

## 2019-12-25 MED ORDER — CALCIUM GLUCONATE-NACL 2-0.675 GM/100ML-% IV SOLN
2.0000 g | Freq: Once | INTRAVENOUS | Status: AC
  Administered 2019-12-25: 2000 mg via INTRAVENOUS
  Filled 2019-12-25: qty 100

## 2019-12-25 MED ORDER — PANTOPRAZOLE SODIUM 40 MG PO PACK
40.0000 mg | PACK | Freq: Every day | ORAL | Status: DC
  Administered 2019-12-25 – 2019-12-27 (×3): 40 mg
  Filled 2019-12-25 (×4): qty 20

## 2019-12-25 MED ORDER — SODIUM CHLORIDE 0.9 % IV SOLN
INTRAVENOUS | Status: DC | PRN
  Administered 2019-12-27 – 2019-12-28 (×2): 250 mL via INTRAVENOUS
  Administered 2019-12-31: 50 mL via INTRAVENOUS
  Administered 2020-01-03: 12:00:00 250 mL via INTRAVENOUS

## 2019-12-25 MED ORDER — THIAMINE HCL 100 MG/ML IJ SOLN
250.0000 mg | Freq: Every day | INTRAVENOUS | Status: AC
  Administered 2019-12-27 – 2019-12-29 (×3): 250 mg via INTRAVENOUS
  Filled 2019-12-25 (×3): qty 2.5

## 2019-12-25 MED ORDER — POLYETHYLENE GLYCOL 3350 17 G PO PACK: 17.0000 g | PACK | Freq: Every day | ORAL | Status: DC | PRN

## 2019-12-25 NOTE — Progress Notes (Signed)
ABG drawn with critical results that MDs are currently aware of. K- 2.7 iCa- 0.82

## 2019-12-25 NOTE — Progress Notes (Signed)
PHARMACY NOTE:  ANTIMICROBIAL RENAL DOSAGE ADJUSTMENT  Current antimicrobial regimen includes a mismatch between antimicrobial dosage and estimated renal function.  As per policy approved by the Pharmacy & Therapeutics and Medical Executive Committees, the antimicrobial dosage will be adjusted accordingly.  Current antimicrobial dosage:  Meropenem 1 g IV Q8H  Indication: Intra-abdominal infection  Renal Function:  Estimated Creatinine Clearance: 42.9 mL/min (A) (by C-G formula based on SCr of 1.53 mg/dL (H)). []      On intermittent HD, scheduled: []      On CRRT    Antimicrobial dosage has been changed to:  Meropenem 1 g IV Q12H  Additional comments: Stop date entered for 4/28 (7 day therapy)  Thank you for allowing pharmacy to be a part of this patient's care.  Lorel [place], PharmD PGY1 Ambulatory Care Resident [employer] # [phone]

## 2019-12-25 NOTE — Progress Notes (Signed)
[place] Progress Note Patient Name: Adam Marshall DOB: 14-Nov-1960 MRN: [id number]   Date of Service  12/25/2019  HPI/Events of Note  Nursing request for CBC to check platelet count.  eICU Interventions  Will order CBC with platelets now.      Intervention Category Major Interventions: Other:  Lysle Dingwall 12/25/2019, 6:30 AM

## 2019-12-25 NOTE — Progress Notes (Signed)
NAME:  Adam Marshall, MRN:  [id number], DOB:  1960/11/30, LOS: 3 ADMISSION DATE:  12/28/2019, CONSULTATION DATE:  12/25/2019 REFERRING MD: Dr. Regenia Skeeter , CHIEF COMPLAINT:  Altered mental status  Brief History   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   Per ED notes, EMS reports that roommate noted AMS with difficulty ambulating to bathroom requiring depends. EMS also reported initial BP of 44/20.   4/20: Experienced hypoxic respiratory failure requiring trach intubation and placed on vent. Suction revealed mucus plugs likely cause. Respiratory status improving. Hypotension continues to be an ongoing problem, however he appears fluid overloaded. Levophed started and maintaining goal MAPs. Lactate and Scr continue to downtrend. WBC and Hgb still stable, however platelets now 13 from 24. CT scan shows some improvement in peripancreatic fluid with no evidence of necrosis.   Penile and scrotal edema continue to worsen. There is now concern for vascular compromise. Urology consulted.   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 Acute edematous pancreatitis. No evidence of pancreatic necrosis. 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 This is slightly decreased in size from 2017 MRI. 2. No acute peripancreatic fluid collection. Free fluid adjacent to the pancreas related to ascites but no focal fluid collection. 3. Edematous appearing distal stomach, scattered small bowel, and left colon, likely reactive, but nonspecific. 4. Small to moderate volume abdominopelvic ascites, increased from CT yesterday. Small to moderate bilateral pleural effusions also increased from yesterday. 5. Hepatic cirrhosis. Innumerable small low-density lesions within the liver are too small to characterize, may represent cysts or biliary hamartomas. This was also seen on 04/26/2016 abdominal MRI. 6. Absent renal excretion on delayed phase imaging, suggesting underlying renal dysfunction. 7. Advanced aortic atherosclerosis.   4/19 CT ABD/PELVIS w/o contrast:   IMPRESSION: 1. Redemonstration of the diffusely edematous appearing pancreas with increasing and now extensive peripancreatic inflammation. Evaluation for pancreatic necrosis is limited in the absence of contrast. 2. There are enlarging peripancreatic fluid collections particularly extending from the anterior pancreatic body and tail as well as a within the pancreaticoduodenal groove, lesser sac, and tracking along the pericolic gutters. Suspect these likely reflect acute peripancreatic collections in the absence of demonstrable pancreatic necrosis for the modified Lana criteria. 3. Increasing volume loss in both lower lobes, right slightly greater than left, with small  bilateral pleural effusions which are new from the comparison  exam. Underlying infection/inflammation is not excluded. 4. Mild gallbladder distention with intermediate attenuation (31 HU). Finding compatible with biliary sludge seen on ultrasound. 5. Diffuse gastric antral, duodenal and colonic wall thickening most pronounced in the hepatic and splenic flexures. Findings are favored to represent reactive inflammation secondary to the adjacent pancreatitis. 6. Colonic diverticulosis without evidence of diverticulitis. 7. Mild edematous changes of the external genitalia are favored to reflect volume third-spacing in the absence of additional focal features of infection. Correlate with visual inspection and exam findings as aggressive/necrotizing soft tissue infection is a clinical diagnosis. 8. Aortic Atherosclerosis (ICD10-I70.0).  4/19 Chest XR:  IMPRESSION: No active disease.  4/18 RUQ ABD U/S:  IMPRESSION: Features are suggestive of acute cholecystitis in the appropriate clinical setting though a sonographic Murphy sign was unable to be fully assessed due to an unresponsive patient.  Mild dilatation of the extrahepatic common bile duct, no visible intraductal stones. If there is concern for choledocholithiasis, MRCP could be obtained.  Diffusely increased hepatic echogenicity, most often seen with hepatic steatosis.  Edematous pancreas with surrounding peripancreatic fluid compatible with features of pancreatitis seen on the same day CT.  4/18 CT Head w/o contrast:  IMPRESSION: 1. No acute intracranial findings. 2. Progressive periventricular white matter hypodensity compatible with chronic ischemic microvascular white matter disease. 3. Increased prominence of sulci along with increased ventricular prominence which is likely ex vacuo.  4/18 CT ABD/PELVIS:  IMPRESSION: 1. Interval acute, uncomplicated pancreatitis. 2. Diffuse low density wall thickening  involving the proximal descending colon and splenic flexure, compatible with inflammatory changes due to the adjacent pancreatitis. 3. Colonic diverticulosis. 4. Sludge in the gallbladder.  It has. 5. Stable bilateral adrenal hyperplasia. 6. Foley catheter in the urinary bladder with a suggestion of moderate diffuse bladder wall thickening, possibly representing changes due to cystitis.  4/18 portable KUB:  IMPRESSION: 1. Nonobstructive bowel gas pattern. 2. Patulous loops of small bowel in the upper abdomen raising the possibility of small bowel enteritis.  4/18 Chest XR:   IMPRESSION: No active disease. No evidence of pneumonia or pulmonary edema.    Micro Data:  4/18  BCx, UCx NGTD @ 48 hrs  Antimicrobials:  4/18: Metronidazole x1 4/18: Cefepime >>4/20 4/18: Vancomycin >> 4/19 4/21: ?Meropenem >>4/27  Interim history/subjective:  See above  Objective   Blood pressure (!) 86/65, pulse 92, temperature 97.7 F (36.5 C), resp. rate 15, height 5\' 6"  (1.676 m), weight 58.3 kg, SpO2 100 %. CVP:  [6 mmHg-11 mmHg] 6 mmHg  Vent Mode: PRVC FiO2 (%):  [28 %-100 %] 28 % Set Rate:  [14 bmp-18 bmp] 14 bmp Vt Set:  [510 mL] 510 mL PEEP:  [5 cmH20] 5 cmH20 Plateau Pressure:  [19 cmH20-20 cmH20] 20 cmH20   Intake/Output Summary (Last 24 hours) at 12/25/2019 1213 Last data filed at 12/25/2019 1200 Gross per 24 hour  Intake 3262.98 ml  Output 2810 ml  Net 452.98 ml   Filed Weights   12/23/19 0456 12/24/19 0448 12/25/19 0458  Weight: 50.3 kg 55.5 kg 58.3 kg    Examination: General: Cachectic appearing male, lying in bed with Bair hugger in place.  HENT: [place]/AT, improved mm, facial twitching (tetany), tracheostoma present with trach tube Lungs: Trach intubation, normal work of breathing on pressure support.  Cardiovascular: RRR, no murmurs. Peripheral pulses 2+ Abdomen: firm, mildly tender to palpation   Extremities: warm and well perfuse.   Skin: warm and edematous     Neuro: No speech. Opens eyes and turns head  when called.  GU: Increased edema noted at penile shaft and scrotum, ulceration noted on glans penis, tender to palpation.     Resolved Hospital Problem list   Metabolic Acidosis 2/2 Lactic Acidosis  Assessment & Plan:   Altered mental status 2/2 hypovolemic shock in the presence of acute pancreatitis:  - Maintain MAPs >65 - Wean Levophed - Place Cortrak: past Ligament of Treitz, start tube feeds, dietitian consulted  - Pain medication prn - F/u cultures NGTD @ 48hr - Restart Abx with meropenem for 7 days - CTM, trend lactate levels, improved today at 2.0  - Thiamine supplementation, 500 IV for 3 days  Hypoxic respiratory failure 2/2 mucus plug: Could not maintain saturations 4/20 pm on trach color. Developed respiratory distress. Trach tube placed in stoma and suctioned mucus plug with notable improvement. Maintaining adequate saturations on pressure support.  - Wean off vent as tolerated  - Place on trach collar   Ventilator induced respiratory alkalosis: Initial ABG on 4/20 revealed respiratory acidosis, however pt developed a resp alkalosis once ventilated. Repeat ABG shows improvement  - Switch to trach collar in the setting of hyperventilation on ventilator.   Pancreatitis: h/o pancreatitis prior to admission. Current EtOh intake unknown. Lipase >2,000, now 900. Abdomen appears tender on exam, peripancreatic improving on 48 hr CT, with no signs of pancreatic necrosis.  - Continue Tube feeds - Pain meds prn and feeds per above.  - Trend Lipase 2x weekly  Metabolic acidosis 2/2 Lactic acidosis:  - Improved, gap currently closed.   Pancytopenia: In setting of systemic inflammation and severe pancreatitis. H/o cirrhosis and malignancy. Initially presented in decreased cells lines, now WBC (5.5) and Hgb (8.3) levels improved. Plts continue to downtrend to 13 from 24 - Transfuse Hgb <7  - Will replace Plts if bleeding occurs or Plts  <10K - Will require close outpatient follow up given h/o malignancy  Hypothermia: 60 F with Bair hugger in place. Currently Bair hugger to air.  - Furniture conservator/restorer - CTM  Hypokalemia/Hypophosphatemia/hypomagnesemia:  - Daily monitor with BMP, mag, phos - replete prn  Hypocalcemia: Albumin currently 1.9, serum calcium 5.8 and ionized 0.82. Facial twitching noted on exam. Hypotension persisting despite fluid management, though on low dose pressors. - Consider IV calcium gluconate   Cirrhosis 2/2 h/o EtOH abuse: Quit in 2017. AST/ALT mildly elevated. Pancytopenia. T. Bili elevated. Cannot r/o hepatic encephalopathy. - Thiamine 500 mg  - Consider lactulose if mentation does not improve.  - CTM  Hypoglycemia Malnutrition:  Chart noted for 30 lb weight loss over the last year. Family reports most of the loss has been recent. Concerning in the setting of h/o malignancy. Social setting unclear. - D/c'd D10 gtt, CBG monitoring - Cortrack in place - Consult dietician tube feeds - Close Outpatient f/u  Elevated TSH  Elevated TSH at 37, now 30. Free T4 is normal at 0.7. Likely sick thyroid, will hold  thyroid hormone replacement at this time.     Best practice:  Diet: Tube feeds, Cortrak  Pain/Anxiety/Delirium protocol (if indicated): Prn meds VAP protocol (if indicated): N/A => trach collar.  DVT prophylaxis: holding d/t thrombocytopenia  GI prophylaxis: protonix Glucose control: Hypoglycemic: improved CTM Mobility: Bedrest Code Status: Full Family Communication: Sister aware of intubation as she was bedside 4/20 pm Disposition: ICU  Labs   CBC: Recent Labs  Lab 12/14/2019 1124 12/31/2019 1142 12/23/19 0039 12/23/19 0039 12/23/19 [id number] 12/23/19 0754 12/24/19 0006 12/24/19 0006 12/24/19 1657 12/24/19 1944 12/25/19 0342 12/25/19  [id number] 12/25/19 0732  WBC 5.1  --  3.2*  --  5.5  --  5.0  --   --   --   --  5.6  --   NEUTROABS 4.7  --   --   --   --   --  4.6  --   --   --    --  5.1  --   HGB 10.9*   < > 6.5*   < > 8.3*   < > 8.0*   < > 8.8* 7.5* 7.5* 7.6* 8.5*  HCT 31.6*   < > 19.0*   < > 24.0*   < > 22.3*   < > 26.0* 22.0* 22.0* 21.6* 25.0*  MCV 97.5  --  99.0  --  92.3  --  89.2  --   --   --   --  92.3  --   PLT 91*  --  44*  --  36*  --  24*  --   --   --   --  13*  --    < > = values in this interval not displayed.    Basic Metabolic Panel: Recent Labs  Lab 12/09/2019 2134 12/23/19 0029 12/23/19 0039 12/23/19 0039 12/24/19 0006 12/24/19 0006 12/24/19 1031 12/24/19 1031 12/24/19 1657 12/24/19 1944 12/25/19 0342 12/25/19 0732 12/25/19 0958  NA 132*   < > 135   < > 137   < > 137   < > 138 138 135 134* 135  K 3.4*   < > 3.4*   < > 2.4*   < > 2.8*   < > 3.5 3.0* 2.8* 2.7* 2.7*  CL 102  --  103  --  100  --  96*  --   --   --   --   --  94*  CO2 12*  --  10*  --  21*  --  27  --   --   --   --   --  29  GLUCOSE 90  --  68*  --  113*  --  146*  --   --   --   --   --  137*  BUN 79*  --  75*  --  49*  --  39*  --   --   --   --   --  29*  CREATININE 2.59*  --  2.62*  --  2.03*  --  1.82*  --   --   --   --   --  1.53*  CALCIUM 7.3*  --  7.7*  --  6.9*  --  6.4*  --   --   --   --   --  5.8*  MG  --   --  1.5*  --  1.6*  --  1.9  --   --   --   --   --   --   PHOS  --   --  1.0*  --  1.1*  --  2.9  --   --   --   --   --   --    < > = values in this interval not displayed.   GFR: Estimated Creatinine Clearance: 42.9 mL/min (A) (by C-G formula based on SCr of 1.53 mg/dL (H)). Recent Labs  Lab 12/23/19 0039 12/23/19 0117 12/23/19 0754 12/23/19 1042 12/24/19 0006 12/24/19 0725 12/24/19 1508 12/25/19 0323 12/25/19 0634 12/25/19 1048  PROCALCITON 1.73  --   --   --  2.01  --   --  2.67  --   --   WBC 3.2*  --  5.5  --  5.0  --   --   --  5.6  --   LATICACIDVEN  --    < >  --    < > 8.1* 5.9* 4.0*  --   --  2.0*   < > = values in this interval not displayed.    Liver Function Tests: Recent Labs  Lab 12/31/2019 1124 12/23/19 0039  12/25/19 0958  AST 125* 84*  83* 55*  ALT 78* 45*  45* 38  ALKPHOS 189* 101  101 189*  BILITOT 2.5* 1.9*  1.8* 1.5*  PROT 7.3 4.4*  4.4* 4.4*  ALBUMIN 3.4* 2.0*  2.1* 1.9*   Recent Labs  Lab 12/28/2019 1124 12/23/19 0039 12/24/19 1031  LIPASE 2,648* 2,176* 959*   No results for input(s): AMMONIA in the last 168 hours.  ABG    Component Value Date/Time   PHART 7.599 (H) 12/25/2019 0732   PCO2ART 33.7 12/25/2019 0732   PO2ART 133.0 (H) 12/25/2019 0732   HCO3 33.1 (H) 12/25/2019 0732   TCO2 34 (H) 12/25/2019 0732   ACIDBASEDEF 13.0 (H) 12/23/2019 0029   O2SAT 99.0 12/25/2019 0732     Coagulation Profile: Recent Labs  Lab 12/15/2019 1124 12/23/19 0245  INR 1.1 1.6*    Cardiac Enzymes: No results for input(s): CKTOTAL, CKMB, CKMBINDEX, TROPONINI in the last 168 hours.  HbA1C: Hemoglobin A1C  Date/Time Value Ref Range Status  05/02/2011 11:52 AM 5.2  Final    CBG: Recent Labs  Lab 12/24/19 2032 12/25/19 0034 12/25/19 0433 12/25/19 0811 12/25/19 1146  GLUCAP 151* 109* 120* 123* 125*    Review of Systems:   Unable to assess given current mental status.  Past Medical History  He,  has a past medical history of Abnormal LFTs, Cancer ([place]), Chronic back pain, Chronic knee pain, Cirrhosis ([place]), Fatty liver, Gout, Hiatal hernia, Hypertension, Pancreatic lesion, Pancreatitis, and Prostate cancer ([place]).   Surgical History    Past Surgical History:  Procedure Laterality Date  . ESOPHAGOGASTRODUODENOSCOPY N/A 03/15/2016   Procedure: ESOPHAGOGASTRODUODENOSCOPY (EGD);  Surgeon: Doran Stabler, MD;  Location: [facility];  Service: Endoscopy;  Laterality: N/A;  . KNEE SURGERY     left  . PANENDOSCOPY N/A 03/18/2016   Procedure: DIRECT LARYNGOSCOPY, WITH BRONCHOSCOPY, AND ESOPHAGOSCOPY, WITH BIOPSY;  Surgeon: [name], MD;  Location: [facility];  Service: ENT;  Laterality: N/A;  . PROSTATE BIOPSY       Social History   reports that he quit smoking about  3 years ago. His smoking use included cigarettes. He has a 9.00 pack-year smoking history. He has never used smokeless tobacco. He reports current alcohol use of about 120.0 standard drinks of alcohol per week. He reports that he does not use drugs.   Family History   His family history includes Diabetes in his maternal grandmother and mother; Kidney disease in his sister; Throat cancer (age of onset: 86) in his brother.   Allergies Allergies  Allergen Reactions  . Penicillins Hives and Rash    Has patient had a PCN reaction causing immediate rash, facial/tongue/throat swelling, SOB or lightheadedness with hypotension:Yes Has patient had a PCN reaction causing severe rash involving mucus membranes or skin necrosis:unsure Has patient had a PCN reaction that required hospitalization:No Has patient had a PCN reaction occurring within the last 10 years:No  If all of the above answers  are "NO", then may proceed with Cephalosporin use. Has patient had a PCN reaction causing immediate rash, facial/tongue/throat swelling, SOB or lightheadedness with hypotension:Yes Has patient had a PCN reaction causing severe rash involving mucus membranes or skin necrosis:unsure Has patient had a PCN reaction that required hospitalization:No Has patient had a PCN reaction occurring within the last 10 years:No  If all of the above answers are "NO", then may proceed with Cephalosporin use.      Home Medications  Prior to Admission medications   Medication Sig Start Date End Date Taking? Authorizing Provider  albuterol (PROVENTIL) (2.5 MG/3ML) 0.083% nebulizer solution Take 3 mLs (2.5 mg total) by nebulization every 6 (six) hours as needed for wheezing or shortness of breath. 03/19/16   Florencia Reasons, MD  allopurinol (ZYLOPRIM) 100 MG tablet Take 1 tablet (100 mg total) by mouth daily. 09/20/16   [name], FNP  amLODipine (NORVASC) 10 MG tablet TAKE 1 TABLET(10 MG) BY MOUTH DAILY Patient not taking: Reported on  12/15/2019 03/13/17   [name], FNP  amLODipine (NORVASC) 10 MG tablet TAKE 1 TABLET(10 MG) BY MOUTH DAILY Patient taking differently: Take 10 mg by mouth daily.  04/18/17   [name], FNP  aspirin 81 MG chewable tablet Chew 81 mg by mouth daily.    [provider]  cyclobenzaprine (FLEXERIL) 5 MG tablet TAKE 1 TABLET(5 MG) BY MOUTH THREE TIMES DAILY AS NEEDED FOR MUSCLE SPASMS Patient taking differently: Take 5 mg by mouth 3 (three) times daily as needed for muscle spasms.  05/29/17   [name], FNP  feeding supplement, ENSURE ENLIVE, (ENSURE ENLIVE) LIQD Take 237 mLs by mouth 3 (three) times daily between meals. 03/19/16   Florencia Reasons, MD  ferrous sulfate 325 (65 FE) MG tablet Take 325 mg by mouth daily with breakfast.    [provider]  Multiple Vitamin (MULTIVITAMIN) tablet Take 1 tablet by mouth daily.    [provider]  NEOMYCIN-POLYMYXIN-HYDROCORTISONE (CORTISPORIN) 1 % SOLN OTIC solution Place 3 drops into the left ear 4 (four) times daily. 04/28/17   Biagio Borg, MD  potassium chloride (K-DUR) 10 MEQ tablet Take 1 tablet (10 mEq total) by mouth daily. 10/27/16   [name], FNP  propranolol (INDERAL) 10 MG tablet TAKE 1 TABLET(10 MG) BY MOUTH TWICE DAILY Patient taking differently: Take 10 mg by mouth 2 (two) times daily.  10/27/16   [name], FNP  sucralfate (CARAFATE) 1 g tablet Take 1 g by mouth 2 (two) times daily. 09/26/19   [provider]  traZODone (DESYREL) 50 MG tablet Take 0.5-1 tablets (25-50 mg total) by mouth at bedtime as needed for sleep. 10/27/16   [name], FNP     Critical care time:   This note was initiated by Carolan Shiver. Meda Coffee, MS4  Reviewed and edited by  Marianna Payment, D.O. [facility], PGY-1 Pager: [phone], Phone: [phone] Date 12/25/2019 Time 12:13 PM

## 2019-12-25 NOTE — Consult Note (Addendum)
Urology Consult   Physician requesting consult: Dr. Lynetta Mare  Reason for consult: Penile edema  History of Present Illness: Adam Marshall is a 59 y.o. with a history of prostate cancer s/p radiation therapy followed by Dr. Kathie Rhodes.  He has not been seen since 2018.  He is now admitted to the ICU with septic shock possibly related to acute pancreatitis.  He developed respiratory distress requiring tracheostomy.  He remains on IV pressor agents currently.  He was noted by the critical care team to have penile edema but also was noted to have some evidence of skin changes of the glans penis prompting urologic consultation.  He also has a history of SCCA of the larynx.  Past Medical History:  Diagnosis Date  . Abnormal LFTs   . 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 Chronic back pain   . Chronic knee pain    left  . Cirrhosis ([place])   . Fatty liver   . Gout   . Hiatal hernia   . Hypertension   . Pancreatic lesion   . Pancreatitis   . Prostate cancer [facility])     Past Surgical History:  Procedure Laterality Date  . ESOPHAGOGASTRODUODENOSCOPY N/A 03/15/2016   Procedure: ESOPHAGOGASTRODUODENOSCOPY (EGD);  Surgeon: Doran Stabler, MD;  Location: [facility];  Service: Endoscopy;  Laterality: N/A;  . KNEE SURGERY     left  . PANENDOSCOPY N/A 03/18/2016   Procedure: DIRECT LARYNGOSCOPY, WITH BRONCHOSCOPY, AND ESOPHAGOSCOPY, WITH BIOPSY;  Surgeon: [name], MD;  Location: [facility];  Service: ENT;  Laterality: N/A;  . PROSTATE BIOPSY      Medications:  Home meds:  No current facility-administered medications on file prior to encounter.   Current Outpatient Medications on File Prior to Encounter  Medication Sig Dispense Refill  . amLODipine (NORVASC) 10 MG tablet TAKE 1 TABLET(10 MG) BY MOUTH DAILY (Patient taking differently: Take 10 mg by mouth daily. ) 90 tablet 1  . aspirin 81 MG chewable tablet Chew 81 mg by mouth daily.    .  cyclobenzaprine (FLEXERIL) 5 MG tablet TAKE 1 TABLET(5 MG) BY MOUTH THREE TIMES DAILY AS NEEDED FOR MUSCLE SPASMS (Patient taking differently: Take 5 mg by mouth 3 (three) times daily as needed for muscle spasms. ) 60 tablet 0  . ferrous sulfate 325 (65 FE) MG tablet Take 325 mg by mouth daily with breakfast.    . Multiple Vitamin (MULTIVITAMIN) tablet Take 1 tablet by mouth daily.    . propranolol (INDERAL) 10 MG tablet TAKE 1 TABLET(10 MG) BY MOUTH TWICE DAILY (Patient taking differently: Take 10 mg by mouth 2 (two) times daily. ) 180 tablet 1  . sucralfate (CARAFATE) 1 g tablet Take 1 g by mouth 2 (two) times daily.    . traZODone (DESYREL) 50 MG tablet Take 0.5-1 tablets (25-50 mg total) by mouth at bedtime as needed for sleep. 30 tablet 3  . albuterol (PROVENTIL) (2.5 MG/3ML) 0.083% nebulizer solution Take 3 mLs (2.5 mg total) by nebulization every 6 (six) hours as needed for wheezing or shortness of breath. 75 mL 12  . allopurinol (ZYLOPRIM) 100 MG tablet Take 1 tablet (100 mg total) by mouth daily. 30 tablet 2  . amLODipine (NORVASC) 10 MG tablet TAKE 1 TABLET(10 MG) BY MOUTH DAILY (Patient not taking: Reported on 12/10/2019) 30 tablet 0  . feeding supplement, ENSURE ENLIVE, (ENSURE ENLIVE) LIQD Take 237 mLs by mouth 3 (three) times daily between  meals. (Patient not taking: Reported on 12/23/2019) 237 mL 12  . NEOMYCIN-POLYMYXIN-HYDROCORTISONE (CORTISPORIN) 1 % SOLN OTIC solution Place 3 drops into the left ear 4 (four) times daily. (Patient not taking: Reported on 12/23/2019) 10 mL 0  . potassium chloride (K-DUR) 10 MEQ tablet Take 1 tablet (10 mEq total) by mouth daily. 4 tablet 0     Scheduled Meds: . chlorhexidine  15 mL Mouth Rinse BID  . Chlorhexidine Gluconate Cloth  6 each Topical Daily  . mouth rinse  15 mL Mouth Rinse q12n4p  . pantoprazole sodium  40 mg Per Tube Daily   Continuous Infusions: . sodium chloride 10 mL/hr at 12/25/19 0519  . sodium chloride    . feeding supplement  (VITAL AF 1.2 CAL) 50 mL/hr at 12/25/19 0519  . meropenem (MERREM) IV 1 g (12/25/19 1615)  . norepinephrine (LEVOPHED) Adult infusion 3 mcg/min (12/25/19 1400)  . [START ON 12/27/2019] thiamine injection    . thiamine injection 500 mg (12/25/19 1047)   PRN Meds:.Place/Maintain arterial line **AND** sodium chloride, sodium chloride, acetaminophen, docusate, Gerhardt's butt cream, polyethylene glycol  Allergies:  Allergies  Allergen Reactions  . Penicillins Hives and Rash    Has patient had a PCN reaction causing immediate rash, facial/tongue/throat swelling, SOB or lightheadedness with hypotension:Yes Has patient had a PCN reaction causing severe rash involving mucus membranes or skin necrosis:unsure Has patient had a PCN reaction that required hospitalization:No Has patient had a PCN reaction occurring within the last 10 years:No  If all of the above answers are "NO", then may proceed with Cephalosporin use. Has patient had a PCN reaction causing immediate rash, facial/tongue/throat swelling, SOB or lightheadedness with hypotension:Yes Has patient had a PCN reaction causing severe rash involving mucus membranes or skin necrosis:unsure Has patient had a PCN reaction that required hospitalization:No Has patient had a PCN reaction occurring within the last 10 years:No  If all of the above answers are "NO", then may proceed with Cephalosporin use.     Family History  Problem Relation Age of Onset  . Diabetes Mother   . Diabetes Maternal Grandmother   . Throat cancer Brother 57  . Kidney disease Sister        Dialysis    Social History:  reports that he quit smoking about 3 years ago. His smoking use included cigarettes. He has a 9.00 pack-year smoking history. He has never used smokeless tobacco. He reports current alcohol use of about 120.0 standard drinks of alcohol per week. He reports that he does not use drugs.  ROS: A complete review of systems was performed.  All systems are  negative except for pertinent findings as noted.  Physical Exam:  Vital signs in last 24 hours: Temp:  [97.3 F (36.3 C)-99.9 F (37.7 C)] 98.1 F (36.7 C) (04/21 1514) Pulse Rate:  [85-122] 92 (04/21 1514) Resp:  [12-32] 12 (04/21 1514) BP: (70-122)/(55-92) 86/65 (04/21 1200) SpO2:  [96 %-100 %] 100 % (04/21 1514) Arterial Line BP: (86-118)/(49-77) 102/57 (04/21 1445) FiO2 (%):  [28 %-100 %] 28 % (04/21 1514) Weight:  [58.3 kg] 58.3 kg (04/21 0458) Constitutional:  Sedated, on mechanical ventilation, arousable but not able to answer questions CV: Generalized edema Genitourinary: Significant penile edema consistent with generalized edema, penile glans with eschar and fibrinous exudative material, Zeroform gauze in place   Laboratory Data:  Recent Labs    12/23/19 0039 12/23/19 0039 12/23/19 [id number] 12/23/19 0754 12/24/19 0006 12/24/19 0006 12/24/19 1657 12/24/19 1944 12/25/19 0342 12/25/19 [id number]  12/25/19 0732  WBC 3.2*  --  5.5  --  5.0  --   --   --   --  5.6  --   HGB 6.5*   < > 8.3*   < > 8.0*   < > 8.8* 7.5* 7.5* 7.6* 8.5*  HCT 19.0*   < > 24.0*   < > 22.3*   < > 26.0* 22.0* 22.0* 21.6* 25.0*  PLT 44*  --  36*  --  24*  --   --   --   --  13*  --    < > = values in this interval not displayed.    Recent Labs    12/26/2019 2134 12/23/19 0029 12/23/19 0039 12/23/19 0039 12/24/19 0006 12/24/19 0006 12/24/19 1031 12/24/19 1031 12/24/19 1657 12/24/19 1944 12/25/19 0342 12/25/19 0732 12/25/19 0958  NA 132*   < > 135   < > 137   < > 137   < > 138 138 135 134* 135  K 3.4*   < > 3.4*   < > 2.4*   < > 2.8*   < > 3.5 3.0* 2.8* 2.7* 2.7*  CL 102  --  103  --  100  --  96*  --   --   --   --   --  94*  GLUCOSE 90  --  68*  --  113*  --  146*  --   --   --   --   --  137*  BUN 79*  --  75*  --  49*  --  39*  --   --   --   --   --  29*  CALCIUM 7.3*  --  7.7*  --  6.9*  --  6.4*  --   --   --   --   --  5.8*  CREATININE 2.59*  --  2.62*  --  2.03*  --  1.82*  --   --    --   --   --  1.53*   < > = values in this interval not displayed.     Results for orders placed or performed during the hospital encounter of 12/20/2019 (from the past 24 hour(s))  I-STAT 7, (LYTES, BLD GAS, ICA, H+H)     Status: Abnormal   Collection Time: 12/24/19  4:57 PM  Result Value Ref Range   pH, Arterial 7.189 (LL) 7.350 - 7.450   pCO2 arterial 81.2 (HH) 32.0 - 48.0 mmHg   pO2, Arterial 41.0 (L) 83.0 - 108.0 mmHg   Bicarbonate 30.9 (H) 20.0 - 28.0 mmol/L   TCO2 33 (H) 22 - 32 mmol/L   O2 Saturation 61.0 %   Acid-Base Excess 2.0 0.0 - 2.0 mmol/L   Sodium 138 135 - 145 mmol/L   Potassium 3.5 3.5 - 5.1 mmol/L   Calcium, Ion 0.90 (L) 1.15 - 1.40 mmol/L   HCT 26.0 (L) 39.0 - 52.0 %   Hemoglobin 8.8 (L) 13.0 - 17.0 g/dL   Patient temperature HIDE    Sample type ARTERIAL    Comment NOTIFIED PHYSICIAN   I-STAT 7, (LYTES, BLD GAS, ICA, H+H)     Status: Abnormal   Collection Time: 12/24/19  7:44 PM  Result Value Ref Range   pH, Arterial 7.600 (H) 7.350 - 7.450   pCO2 arterial 38.9 32.0 - 48.0 mmHg   pO2, Arterial 309.0 (H) 83.0 - 108.0 mmHg   Bicarbonate 38.2 (H) 20.0 -  28.0 mmol/L   TCO2 39 (H) 22 - 32 mmol/L   O2 Saturation 100.0 %   Acid-Base Excess 15.0 (H) 0.0 - 2.0 mmol/L   Sodium 138 135 - 145 mmol/L   Potassium 3.0 (L) 3.5 - 5.1 mmol/L   Calcium, Ion 0.82 (LL) 1.15 - 1.40 mmol/L   HCT 22.0 (L) 39.0 - 52.0 %   Hemoglobin 7.5 (L) 13.0 - 17.0 g/dL   Patient temperature HIDE    Collection site ARTERIAL LINE    Drawn by RT    Sample type ARTERIAL    Comment NOTIFIED PHYSICIAN   Glucose, capillary     Status: Abnormal   Collection Time: 12/24/19  8:32 PM  Result Value Ref Range   Glucose-Capillary 151 (H) 70 - 99 mg/dL   Comment 1 Notify RN   Glucose, capillary     Status: Abnormal   Collection Time: 12/25/19 12:34 AM  Result Value Ref Range   Glucose-Capillary 109 (H) 70 - 99 mg/dL   Comment 1 Notify RN   Procalcitonin     Status: None   Collection Time:  12/25/19  3:23 AM  Result Value Ref Range   Procalcitonin 2.67 ng/mL  I-STAT 7, (LYTES, BLD GAS, ICA, H+H)     Status: Abnormal   Collection Time: 12/25/19  3:42 AM  Result Value Ref Range   pH, Arterial 7.631 (HH) 7.350 - 7.450   pCO2 arterial 30.2 (L) 32.0 - 48.0 mmHg   pO2, Arterial 110.0 (H) 83.0 - 108.0 mmHg   Bicarbonate 31.8 (H) 20.0 - 28.0 mmol/L   TCO2 33 (H) 22 - 32 mmol/L   O2 Saturation 99.0 %   Acid-Base Excess 10.0 (H) 0.0 - 2.0 mmol/L   Sodium 135 135 - 145 mmol/L   Potassium 2.8 (L) 3.5 - 5.1 mmol/L   Calcium, Ion 0.78 (LL) 1.15 - 1.40 mmol/L   HCT 22.0 (L) 39.0 - 52.0 %   Hemoglobin 7.5 (L) 13.0 - 17.0 g/dL   Patient temperature 99.2 F    Collection site ARTERIAL LINE    Drawn by RT    Sample type ARTERIAL    Comment NOTIFIED PHYSICIAN   Glucose, capillary     Status: Abnormal   Collection Time: 12/25/19  4:33 AM  Result Value Ref Range   Glucose-Capillary 120 (H) 70 - 99 mg/dL   Comment 1 Notify RN   CBC with Differential/Platelet     Status: Abnormal   Collection Time: 12/25/19  6:34 AM  Result Value Ref Range   WBC 5.6 4.0 - 10.5 K/uL   RBC 2.34 (L) 4.22 - 5.81 MIL/uL   Hemoglobin 7.6 (L) 13.0 - 17.0 g/dL   HCT 21.6 (L) 39.0 - 52.0 %   MCV 92.3 80.0 - 100.0 fL   MCH 32.5 26.0 - 34.0 pg   MCHC 35.2 30.0 - 36.0 g/dL   RDW 16.4 (H) 11.5 - 15.5 %   Platelets 13 (LL) 150 - 400 K/uL   nRBC 0.0 0.0 - 0.2 %   Neutrophils Relative % 91 %   Neutro Abs 5.1 1.7 - 7.7 K/uL   Lymphocytes Relative 3 %   Lymphs Abs 0.2 (L) 0.7 - 4.0 K/uL   Monocytes Relative 4 %   Monocytes Absolute 0.3 0.1 - 1.0 K/uL   Eosinophils Relative 1 %   Eosinophils Absolute 0.0 0.0 - 0.5 K/uL   Basophils Relative 0 %   Basophils Absolute 0.0 0.0 - 0.1 K/uL   WBC Morphology DOHLE  BODIES    Immature Granulocytes 1 %   Abs Immature Granulocytes 0.04 0.00 - 0.07 K/uL  I-STAT 7, (LYTES, BLD GAS, ICA, H+H)     Status: Abnormal   Collection Time: 12/25/19  7:32 AM  Result Value Ref Range    pH, Arterial 7.599 (H) 7.350 - 7.450   pCO2 arterial 33.7 32.0 - 48.0 mmHg   pO2, Arterial 133.0 (H) 83.0 - 108.0 mmHg   Bicarbonate 33.1 (H) 20.0 - 28.0 mmol/L   TCO2 34 (H) 22 - 32 mmol/L   O2 Saturation 99.0 %   Acid-Base Excess 11.0 (H) 0.0 - 2.0 mmol/L   Sodium 134 (L) 135 - 145 mmol/L   Potassium 2.7 (LL) 3.5 - 5.1 mmol/L   Calcium, Ion 0.82 (LL) 1.15 - 1.40 mmol/L   HCT 25.0 (L) 39.0 - 52.0 %   Hemoglobin 8.5 (L) 13.0 - 17.0 g/dL   Patient temperature 98.1 F    Collection site ARTERIAL LINE    Drawn by Operator    Sample type ARTERIAL    Comment NOTIFIED PHYSICIAN   Glucose, capillary     Status: Abnormal   Collection Time: 12/25/19  8:11 AM  Result Value Ref Range   Glucose-Capillary 123 (H) 70 - 99 mg/dL  Comprehensive metabolic panel     Status: Abnormal   Collection Time: 12/25/19  9:58 AM  Result Value Ref Range   Sodium 135 135 - 145 mmol/L   Potassium 2.7 (LL) 3.5 - 5.1 mmol/L   Chloride 94 (L) 98 - 111 mmol/L   CO2 29 22 - 32 mmol/L   Glucose, Bld 137 (H) 70 - 99 mg/dL   BUN 29 (H) 6 - 20 mg/dL   Creatinine, Ser 1.53 (H) 0.61 - 1.24 mg/dL   Calcium 5.8 (LL) 8.9 - 10.3 mg/dL   Total Protein 4.4 (L) 6.5 - 8.1 g/dL   Albumin 1.9 (L) 3.5 - 5.0 g/dL   AST 55 (H) 15 - 41 U/L   ALT 38 0 - 44 U/L   Alkaline Phosphatase 189 (H) 38 - 126 U/L   Total Bilirubin 1.5 (H) 0.3 - 1.2 mg/dL   GFR calc non Af Amer 49 (L) >60 mL/min   GFR calc Af Amer 57 (L) >60 mL/min   Anion gap 12 5 - 15  Lactic acid, plasma     Status: Abnormal   Collection Time: 12/25/19 10:48 AM  Result Value Ref Range   Lactic Acid, Venous 2.0 (HH) 0.5 - 1.9 mmol/L  Glucose, capillary     Status: Abnormal   Collection Time: 12/25/19 11:46 AM  Result Value Ref Range   Glucose-Capillary 125 (H) 70 - 99 mg/dL  Culture, respiratory (non-expectorated)     Status: None (Preliminary result)   Collection Time: 12/25/19 11:56 AM   Specimen: Tracheal Aspirate; Respiratory  Result Value Ref Range    Specimen Description TRACHEAL ASPIRATE    Special Requests NONE    Gram Stain      FEW WBC PRESENT,BOTH PMN AND MONONUCLEAR FEW GRAM POSITIVE RODS RARE GRAM POSITIVE COCCI IN PAIRS Performed at [facility], [place] [address], [place],  [address]    Culture PENDING    Report Status PENDING   Technologist smear review     Status: None   Collection Time: 12/25/19  1:56 PM  Result Value Ref Range   WBC Morphology MILD LEFT SHIFT (1-5% METAS, OCC MYELO, OCC BANDS)    RBC Morphology MORPHOLOGY UNREMARKABLE  Tech Review PLATELETS APPEAR DECREASED    Recent Results (from the past 240 hour(s))  Blood Culture (routine x 2)     Status: None (Preliminary result)   Collection Time: 12/07/2019 11:27 AM   Specimen: BLOOD RIGHT FOREARM  Result Value Ref Range Status   Specimen Description BLOOD RIGHT FOREARM  Final   Special Requests   Final    BOTTLES DRAWN AEROBIC ONLY Blood Culture results may not be optimal due to an inadequate volume of blood received in culture bottles   Culture   Final    NO GROWTH 3 DAYS Performed at [facility], [place] [address] [place], [place] [address]    Report Status PENDING  Incomplete  Blood Culture (routine x 2)     Status: None (Preliminary result)   Collection Time: 12/20/2019 11:45 AM   Specimen: BLOOD  Result Value Ref Range Status   Specimen Description BLOOD SITE NOT SPECIFIED  Final   Special Requests   Final    BOTTLES DRAWN AEROBIC AND ANAEROBIC Blood Culture adequate volume   Culture   Final    NO GROWTH 3 DAYS Performed at [facility], [address] [place], [place] [address]    Report Status PENDING  Incomplete  Respiratory Panel by RT PCR (Flu A&B, Covid) - Nasopharyngeal Swab     Status: None   Collection Time: 12/19/2019  1:38 PM   Specimen: Nasopharyngeal Swab  Result Value Ref Range Status   SARS Coronavirus 2 by RT PCR NEGATIVE NEGATIVE Final    Comment: (NOTE) SARS-CoV-2 target nucleic acids are NOT  DETECTED. The SARS-CoV-2 RNA is generally detectable in upper respiratoy specimens during the acute phase of infection. The lowest concentration of SARS-CoV-2 viral copies this assay can detect is 131 copies/mL. A negative result does not preclude SARS-Cov-2 infection and should not be used as the sole basis for treatment or other patient management decisions. A negative result may occur with  improper specimen collection/handling, submission of specimen other than nasopharyngeal swab, presence of viral mutation(s) within the areas targeted by this assay, and inadequate number of viral copies (<131 copies/mL). A negative result must be combined with clinical observations, patient history, and epidemiological information. The expected result is Negative. Fact Sheet for Patients:  PinkCheek.be Fact Sheet for Healthcare Providers:  GravelBags.it This test is not yet ap proved or cleared by the [place] FDA and  has been authorized for detection and/or diagnosis of SARS-CoV-2 by FDA under an Emergency Use Authorization (EUA). This EUA will remain  in effect (meaning this test can be used) for the duration of the COVID-19 declaration under Section 564(b)(1) of the Act, 21 U.S.C. section 360bbb-3(b)(1), unless the authorization is terminated or revoked sooner.    Influenza A by PCR NEGATIVE NEGATIVE Final   Influenza B by PCR NEGATIVE NEGATIVE Final    Comment: (NOTE) The Xpert Xpress SARS-CoV-2/FLU/RSV assay is intended as an aid in  the diagnosis of influenza from Nasopharyngeal swab specimens and  should not be used as a sole basis for treatment. Nasal washings and  aspirates are unacceptable for Xpert Xpress SARS-CoV-2/FLU/RSV  testing. Fact Sheet for Patients: PinkCheek.be Fact Sheet for Healthcare Providers: GravelBags.it This test is not yet approved or cleared  by the [place] FDA and  has been authorized for detection and/or diagnosis of SARS-CoV-2 by  FDA under an Emergency Use Authorization (EUA). This EUA will remain  in effect (meaning this test can be used) for the  duration of the  Covid-19 declaration under Section 564(b)(1) of the Act, 21  U.S.C. section 360bbb-3(b)(1), unless the authorization is  terminated or revoked. Performed at [facility], [place] [address] [place], [place] [address]   MRSA PCR Screening     Status: None   Collection Time: 12/21/2019  5:00 PM   Specimen: Nasopharyngeal  Result Value Ref Range Status   MRSA by PCR NEGATIVE NEGATIVE Final    Comment:        The GeneXpert MRSA Assay (FDA approved for NASAL specimens only), is one component of a comprehensive MRSA colonization surveillance program. It is not intended to diagnose MRSA infection nor to guide or monitor treatment for MRSA infections. Performed at [facility], [place] [address], [place], [place] [address]   Urine culture     Status: None   Collection Time: 01/02/2020  6:47 PM   Specimen: In/Out Cath Urine  Result Value Ref Range Status   Specimen Description IN/OUT CATH URINE  Final   Special Requests NONE  Final   Culture   Final    NO GROWTH Performed at [facility], [place] [address] [place], [place] [address]    Report Status 12/23/2019 FINAL  Final  Culture, respiratory (non-expectorated)     Status: None (Preliminary result)   Collection Time: 12/25/19 11:56 AM   Specimen: Tracheal Aspirate; Respiratory  Result Value Ref Range Status   Specimen Description TRACHEAL ASPIRATE  Final   Special Requests NONE  Final   Gram Stain   Final    FEW WBC PRESENT,BOTH PMN AND MONONUCLEAR FEW GRAM POSITIVE RODS RARE GRAM POSITIVE COCCI IN PAIRS Performed at [facility], [place] [address] [place], [place] [address]    Culture PENDING  Incomplete   Report Status PENDING  Incomplete    Renal Function: Recent Labs     01/02/2020 1124 12/10/2019 1142 12/17/2019 2134 12/23/19 0039 12/24/19 0006 12/24/19 1031 12/25/19 0958  CREATININE 3.09* 2.80* 2.59* 2.62* 2.03* 1.82* 1.53*   Estimated Creatinine Clearance: 42.9 mL/min (A) (by C-G formula based on SCr of 1.53 mg/dL (H)).  Radiologic Imaging: CT ABDOMEN PELVIS W CONTRAST  Result Date: 12/25/2019 CLINICAL DATA:  Necrotizing pancreatitis EXAM: CT ABDOMEN AND PELVIS WITH CONTRAST TECHNIQUE: Multidetector CT imaging of the abdomen and pelvis was performed using the standard protocol following bolus administration of intravenous contrast. CONTRAST:  1109mL OMNIPAQUE IOHEXOL 300 MG/ML  SOLN COMPARISON:  Noncontrast CT yesterday. This is the patient's third abdominopelvic CT in 3 days. Abdominal MRI 04/26/2016 FINDINGS: Lower chest: Small to moderate bilateral pleural effusions, increased from yesterday. Adjacent compressive atelectasis. Emphysema scarring in the right middle lobe. Hepatobiliary: Nodular hepatic contours consistent with cirrhosis. Scattered tiny hepatic hypodensities that are nonspecific, may represent cysts or biliary hamartomas. Dystrophic calcification adjacent the falciform ligament is unchanged. No calcified gallstone. No biliary dilatation. Pancreas: Mild diffuse peripancreatic fat stranding, similar to prior. No evidence of abnormal enhancement or pancreatic necrosis. Cystic structure in the pancreatic head measuring 17 x 15 x 17 mm better defined on the current exam given IV contrast. Mild pancreatic ductal dilatation at 4 mm. There is no other acute peripancreatic collection. Previously question peripancreatic fluid collections likely represent generalized ascites. Spleen: Normal in size without focal abnormality. The splenic vein is patent. Adrenals/Urinary Tract: Nodular adrenal thickening, more prominent on the left. No hydronephrosis. No significant perinephric edema. 8 mm cyst in the posterior left kidney. There is absent renal excretion on delayed  phase imaging. Urinary bladder is decompressed by Foley catheter. Stomach/Bowel: Enteric tube in place with tip in the proximal jejunum. Small amount of fluid and contrast in the stomach. Areas of gastric wall thickening about the body likely reactive. There scattered areas of duodenal wall thickening, also likely reactive. Slightly edematous appearance of small bowel in the left upper quadrant. Administered enteric contrast reaches the colon. Colon appears fluid-filled with equivocal wall thickening involving the splenic flexure through the sigmoid. Distal colonic diverticulosis without focal diverticulitis. Rectal tube in place. Vascular/Lymphatic: Advanced aortic and branch atherosclerosis. No aortic aneurysm. Dense calcification at the origin of the renal arteries. The portal and splenic veins are patent. Mesenteric vessels are patent. Evaluation for adenopathy is limited given paucity of intra-abdominal fat and ascites, no bulky abdominopelvic lymph nodes. Reproductive: Fiducial markers in the prostate. Other: Small to moderate volume abdominopelvic ascites, increased from CT yesterday. There is no free air. Other than the cystic structure in the pancreatic head, no focal fluid collection. Mild generalized body wall edema. Musculoskeletal: Multilevel degenerative change in the lumbar spine. Bones appear under mineralized. No acute osseous abnormalities are seen. IMPRESSION: 1. Acute edematous pancreatitis. No evidence of pancreatic necrosis. 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 This is slightly decreased in size from 2017 MRI. 2. No acute peripancreatic fluid collection. Free fluid adjacent to the pancreas related to ascites but no focal fluid collection. 3. Edematous appearing distal stomach, scattered small bowel, and left colon, likely reactive, but nonspecific. 4. Small to moderate volume abdominopelvic ascites,  increased from CT yesterday. Small to moderate bilateral pleural effusions also increased from yesterday. 5. Hepatic cirrhosis. Innumerable small low-density lesions within the liver are too small to characterize, may represent cysts or biliary hamartomas. This was also seen on 04/26/2016 abdominal MRI. 6. Absent renal excretion on delayed phase imaging, suggesting underlying renal dysfunction. 7. Advanced aortic atherosclerosis. Aortic Atherosclerosis (ICD10-I70.0). Electronically Signed   By: Keith Rake M.D.   On: 12/25/2019 00:19   DG CHEST PORT 1 VIEW  Result Date: 12/24/2019 CLINICAL DATA:  59 year old male with respiratory failure. EXAM: PORTABLE CHEST 1 VIEW COMPARISON:  Chest radiograph dated 12/23/2019. FINDINGS: Tracheostomy with tip approximately 4 cm above the carina. Right subclavian central venous line with tip over central SVC close to the cavoatrial junction. Feeding tube extends below the diaphragm with tip beyond the inferior margin of the image. Small bilateral pleural effusions with bibasilar atelectasis or infiltrate, new since the prior radiograph. No pneumothorax. Stable cardiomediastinal silhouette. Atherosclerotic calcification of the aorta. No acute osseous pathology. IMPRESSION: Small bilateral pleural effusions with bibasilar atelectasis or infiltrate, new since the prior radiograph. Electronically Signed   By: Anner Crete M.D.   On: 12/24/2019 18:35    I independently reviewed the above imaging studies.  Impression/Recommendation 1) Penile edema: This appears related to his overall medical condition.  Continue observation.  Foreskin is easily reducible.  No evidence of paraphimosis.   2) Glans penis skin changes: Eschar formation and fibrinous changes consistent with poor distal circulation likely related to recent sepsis.  Continue wound care with Zeroform.  No indication for surgical intervention.  If it appears to be worsening, please reconsult urology for  further evaluation but this is likely related to his sepsis and poor vascular perfusion of glans as opposed to any primary penile etiology. 3) Prostate cancer:  Will need to follow up with Dr. [name] as outpatient after  recovery of his acute illness.  Dutch Gray 12/25/2019, 4:33 PM    Pryor Curia MD  CC: Dr. Lynetta Mare

## 2019-12-25 NOTE — Progress Notes (Signed)
[place] Progress Note Patient Name: Adam Marshall DOB: [birth year] MRN: [id number]   Date of Service  12/25/2019  HPI/Events of Note  ABG on 40%/PRVC 18/TV 510/P 5 = 7.63/30.2/110/31.8  eICU Interventions  Will order: 1. Decrease PRVC rate to 14. 2. Repeat ABG at 7:15 AM.     Intervention Category Major Interventions: Acid-Base disturbance - evaluation and management;Respiratory failure - evaluation and management  [name] 12/25/2019, 4:09 AM

## 2019-12-25 NOTE — Progress Notes (Signed)
Pt transported from 2M12 to CT and back with no complications.

## 2019-12-25 NOTE — Progress Notes (Signed)
Patient medications found in room in personal belongings bag. Packaged, witnessed, and sent to pharmacy with charge nurse. Reminder slip on patients whiteboard to retrieve before discharge.   Adam Marshall

## 2019-12-25 NOTE — Progress Notes (Signed)
Critical ABG results called in to [place].

## 2019-12-26 DIAGNOSIS — K859 Acute pancreatitis without necrosis or infection, unspecified: Secondary | ICD-10-CM | POA: Diagnosis not present

## 2019-12-26 DIAGNOSIS — J9601 Acute respiratory failure with hypoxia: Secondary | ICD-10-CM | POA: Diagnosis not present

## 2019-12-26 DIAGNOSIS — R579 Shock, unspecified: Secondary | ICD-10-CM | POA: Diagnosis not present

## 2019-12-26 LAB — CBC
HCT: 20.1 % — ABNORMAL LOW (ref 39.0–52.0)
HCT: 24.5 % — ABNORMAL LOW (ref 39.0–52.0)
Hemoglobin: 6.9 g/dL — CL (ref 13.0–17.0)
Hemoglobin: 8.4 g/dL — ABNORMAL LOW (ref 13.0–17.0)
MCH: 32.1 pg (ref 26.0–34.0)
MCH: 30.9 pg (ref 26.0–34.0)
MCHC: 34.3 g/dL (ref 30.0–36.0)
MCHC: 34.3 g/dL (ref 30.0–36.0)
MCV: 93.5 fL (ref 80.0–100.0)
MCV: 90.1 fL (ref 80.0–100.0)
Platelets: 9 10*3/uL — CL (ref 150–400)
Platelets: 34 10*3/uL — ABNORMAL LOW (ref 150–400)
RBC: 2.15 MIL/uL — ABNORMAL LOW (ref 4.22–5.81)
RBC: 2.72 MIL/uL — ABNORMAL LOW (ref 4.22–5.81)
RDW: 16.1 % — ABNORMAL HIGH (ref 11.5–15.5)
RDW: 16.5 % — ABNORMAL HIGH (ref 11.5–15.5)
WBC: 4.9 10*3/uL (ref 4.0–10.5)
WBC: 6.1 10*3/uL (ref 4.0–10.5)
nRBC: 0 % (ref 0.0–0.2)
nRBC: 0.3 % — ABNORMAL HIGH (ref 0.0–0.2)

## 2019-12-26 LAB — COMPREHENSIVE METABOLIC PANEL
ALT: 46 U/L — ABNORMAL HIGH (ref 0–44)
AST: 73 U/L — ABNORMAL HIGH (ref 15–41)
Albumin: 1.7 g/dL — ABNORMAL LOW (ref 3.5–5.0)
Alkaline Phosphatase: 292 U/L — ABNORMAL HIGH (ref 38–126)
Anion gap: 9 (ref 5–15)
BUN: 26 mg/dL — ABNORMAL HIGH (ref 6–20)
CO2: 29 mmol/L (ref 22–32)
Calcium: 6.4 mg/dL — CL (ref 8.9–10.3)
Chloride: 99 mmol/L (ref 98–111)
Creatinine, Ser: 1.41 mg/dL — ABNORMAL HIGH (ref 0.61–1.24)
GFR calc Af Amer: >60 mL/min (ref >60)
GFR calc non Af Amer: 54 mL/min — ABNORMAL LOW (ref >60)
Glucose, Bld: 120 mg/dL — ABNORMAL HIGH (ref 70–99)
Potassium: 3.3 mmol/L — ABNORMAL LOW (ref 3.5–5.1)
Sodium: 137 mmol/L (ref 135–145)
Total Bilirubin: 1.3 mg/dL — ABNORMAL HIGH (ref 0.3–1.2)
Total Protein: 4.3 g/dL — ABNORMAL LOW (ref 6.5–8.1)

## 2019-12-26 LAB — GLUCOSE, CAPILLARY
Glucose-Capillary: 101 mg/dL — ABNORMAL HIGH (ref 70–99)
Glucose-Capillary: 102 mg/dL — ABNORMAL HIGH (ref 70–99)
Glucose-Capillary: 106 mg/dL — ABNORMAL HIGH (ref 70–99)
Glucose-Capillary: 115 mg/dL — ABNORMAL HIGH (ref 70–99)
Glucose-Capillary: 117 mg/dL — ABNORMAL HIGH (ref 70–99)
Glucose-Capillary: 97 mg/dL (ref 70–99)

## 2019-12-26 LAB — RETICULOCYTES
Immature Retic Fract: 1.6 % — ABNORMAL LOW (ref 2.3–15.9)
RBC.: 2.16 MIL/uL — ABNORMAL LOW (ref 4.22–5.81)
Retic Count, Absolute: 4.1 10*3/uL — ABNORMAL LOW (ref 19.0–186.0)
Retic Ct Pct: <0.4 % — ABNORMAL LOW (ref 0.4–3.1)

## 2019-12-26 LAB — LACTIC ACID, PLASMA: Lactic Acid, Venous: 1.7 mmol/L (ref 0.5–1.9)

## 2019-12-26 LAB — PATHOLOGIST SMEAR REVIEW

## 2019-12-26 LAB — CALCIUM, IONIZED: Calcium, Ionized, Serum: 4.1 mg/dL — ABNORMAL LOW (ref 4.5–5.6)

## 2019-12-26 LAB — PREPARE RBC (CROSSMATCH)

## 2019-12-26 MED ORDER — POTASSIUM CHLORIDE 20 MEQ/15ML (10%) PO SOLN
20.0000 meq | ORAL | Status: AC
  Administered 2019-12-26 (×2): 20 meq
  Filled 2019-12-26 (×2): qty 15

## 2019-12-26 MED ORDER — SODIUM CHLORIDE 0.9% FLUSH: 10.0000 mL | INTRAVENOUS | Status: DC | PRN

## 2019-12-26 MED ORDER — CALCIUM GLUCONATE-NACL 1-0.675 GM/50ML-% IV SOLN
1.0000 g | Freq: Once | INTRAVENOUS | Status: AC
  Administered 2019-12-26: 1000 mg via INTRAVENOUS
  Filled 2019-12-26: qty 50

## 2019-12-26 MED ORDER — SODIUM CHLORIDE 0.9% FLUSH
10.0000 mL | Freq: Two times a day (BID) | INTRAVENOUS | Status: DC
  Administered 2019-12-26: 40 mL
  Administered 2019-12-27: 10 mL
  Administered 2019-12-27: 20 mL
  Administered 2019-12-28 – 2019-12-31 (×6): 10 mL

## 2019-12-26 NOTE — Progress Notes (Signed)
[facility] ADULT ICU REPLACEMENT PROTOCOL FOR AM LAB REPLACEMENT ONLY  The patient does apply for the [facility] Adult ICU Electrolyte Replacment Protocol based on the criteria listed below:   1. Is GFR >/= 40 ml/min? Yes.    Patient's GFR today is >60 2. Is urine output >/= 0.5 ml/kg/hr for the last 6 hours? Yes.   Patient's UOP is 1.2 ml/kg/hr 3. Is BUN < 60 mg/dL? Yes.    Patient's BUN today is 26 4. Abnormal electrolyte(s): k 3.3 5. Ordered repletion with: protocol per ng tube 6. If a panic level lab has been reported, has the CCM MD in charge been notified? No..   Physician:    Ronda Fairly A 12/26/2019 4:29 AM

## 2019-12-26 NOTE — Progress Notes (Signed)
NAME:  Adam Marshall, MRN:  [id number], DOB:  [birth year], LOS: 4 ADMISSION DATE:  01/01/2020, CONSULTATION DATE:  12/06/2019 REFERRING MD: Dr. Regenia Skeeter , CHIEF COMPLAINT:  Altered mental status  Brief History   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   Per ED notes, EMS reports that roommate noted AMS with difficulty ambulating to bathroom requiring depends. EMS also reported initial BP of 44/20.   4/20: Experienced hypoxic respiratory failure requiring trach intubation and placed on vent. Suction revealed mucus plugs likely cause. Respiratory status improving. Hypotension continues to be an ongoing problem, however he appears fluid overloaded. Levophed started and maintaining goal MAPs. Lactate and Scr continue to downtrend. WBC and Hgb still stable, however platelets now 13 from 24. CT scan shows some improvement in peripancreatic fluid with no evidence of necrosis.   Penile and scrotal edema continue to worsen. There is now concern for vascular compromise. Urology consulted, appreciate recs.   4/21: Platelets and hgb continued to downtrend requiring 1 unit of each. Repletion of electrolytes completed as well. Overall clinical presentation appears to have improved. Patient is more responsive on exam, maintaining goal MAPs w/o pressors and satting well on trach collar.    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 Acute edematous pancreatitis. No evidence of pancreatic necrosis. 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 This is slightly decreased in size from 2017 MRI. 2. No acute peripancreatic fluid collection. Free fluid adjacent to the pancreas related to ascites but no focal fluid collection. 3. Edematous appearing distal stomach, scattered small bowel, and left colon, likely reactive, but nonspecific. 4. Small to moderate volume abdominopelvic ascites, increased from CT yesterday. Small to moderate bilateral pleural effusions also increased from yesterday. 5. Hepatic cirrhosis. Innumerable small low-density lesions within the liver are too small to characterize, may represent cysts or biliary hamartomas. This was also seen on 04/26/2016 abdominal MRI. 6. Absent renal excretion on delayed phase imaging, suggesting underlying renal dysfunction. 7. Advanced aortic atherosclerosis.   4/19 CT ABD/PELVIS w/o contrast:   IMPRESSION: 1. Redemonstration of the diffusely edematous appearing pancreas with increasing and now extensive peripancreatic inflammation. Evaluation for pancreatic necrosis is limited in the absence of contrast. 2. There are enlarging peripancreatic fluid collections particularly extending from the anterior pancreatic body and tail as well as a within the pancreaticoduodenal groove, lesser sac,  and tracking  along the pericolic gutters. Suspect these likely reflect acute peripancreatic collections in the absence of demonstrable pancreatic necrosis for the modified Lana criteria. 3. Increasing volume loss in both lower lobes, right slightly greater than left, with small bilateral pleural effusions which are new from the comparison exam. Underlying infection/inflammation is not excluded. 4. Mild gallbladder distention with intermediate attenuation (31 HU). Finding compatible with biliary sludge seen on ultrasound. 5. Diffuse gastric antral, duodenal and colonic wall thickening most pronounced in the hepatic and splenic flexures. Findings are favored to represent reactive inflammation secondary to the adjacent pancreatitis. 6. Colonic diverticulosis without evidence of diverticulitis. 7. Mild edematous changes of the external genitalia are favored to reflect volume third-spacing in the absence of additional focal features of infection. Correlate with visual inspection and exam findings as aggressive/necrotizing soft tissue infection is a clinical diagnosis. 8. Aortic Atherosclerosis (ICD10-I70.0).  4/19 Chest XR:  IMPRESSION: No active disease.  4/18 RUQ ABD U/S:  IMPRESSION: Features are suggestive of acute cholecystitis in the appropriate clinical setting though a sonographic Murphy sign was unable to be fully assessed due to an unresponsive patient.  Mild dilatation of the extrahepatic common bile duct, no visible intraductal stones. If there is concern for choledocholithiasis, MRCP could be obtained.  Diffusely increased hepatic echogenicity, most often seen with hepatic steatosis.  Edematous pancreas with surrounding peripancreatic fluid compatible with features of pancreatitis seen on the same day CT.  4/18 CT Head w/o contrast:  IMPRESSION: 1. No acute intracranial findings. 2. Progressive periventricular white matter hypodensity compatible with  chronic ischemic microvascular white matter disease. 3. Increased prominence of sulci along with increased ventricular prominence which is likely ex vacuo.  4/18 CT ABD/PELVIS:  IMPRESSION: 1. Interval acute, uncomplicated pancreatitis. 2. Diffuse low density wall thickening involving the proximal descending colon and splenic flexure, compatible with inflammatory changes due to the adjacent pancreatitis. 3. Colonic diverticulosis. 4. Sludge in the gallbladder.  It has. 5. Stable bilateral adrenal hyperplasia. 6. Foley catheter in the urinary bladder with a suggestion of moderate diffuse bladder wall thickening, possibly representing changes due to cystitis.  4/18 portable KUB:  IMPRESSION: 1. Nonobstructive bowel gas pattern. 2. Patulous loops of small bowel in the upper abdomen raising the possibility of small bowel enteritis.  4/18 Chest XR:   IMPRESSION: No active disease. No evidence of pneumonia or pulmonary edema.    Micro Data:  4/18  BCx, UCx NGTD @ 72 hrs  Antimicrobials:  4/18: Metronidazole x1 4/18: Cefepime >>4/20 4/18: Vancomycin >> 4/19 4/21: Meropenem >>4/27  Interim history/subjective:  See above  Objective   Blood pressure 100/77, pulse 98, temperature 99.1 F (37.3 C), resp. rate 17, height 5\' 6"  (1.676 m), weight 58.3 kg, SpO2 98 %. CVP:  [3 mmHg-5 mmHg] 5 mmHg  FiO2 (%):  [28 %] 28 %   Intake/Output Summary (Last 24 hours) at 12/26/2019 1201 Last data filed at 12/26/2019 1100 Gross per 24 hour  Intake 2470.72 ml  Output 2225 ml  Net 245.72 ml   Filed Weights   12/24/19 0448 12/25/19 0458 12/26/19 0341  Weight: 55.5 kg 58.3 kg 58.3 kg    Examination: General: Cachetic appearing male in NAD, sitting up in bed.   HENT: [place]/AT, EOMI, MMM, Shiley trach in place. Lungs: Diminished breath sounds with NWOB on trach collar.  Cardiovascular: RRR Abdomen: Softer compared to 4/21 exam. Mildly tender and distended Extremities: warm and well  perfuse.  Skin: warm and edematous   Neuro: No speech, moves extremities independently,  Follows simple commands.  Psych: Improved affect, appears to nod and be more engaged in conversations on care.  GU: Edema noted at penile shaft and scrotum, ulceration noted on glans penis, tender to palpation. Unchanged from 4/21 exam.    Resolved Hospital Problem list   Metabolic Acidosis 2/2 Lactic Acidosis  Assessment & Plan:   Altered mental status 2/2 hypovolemic shock in the presence of acute pancreatitis: mentation appears to be improving. Patient is alert and following commands.  - Maintain MAPs >65 - Levophed d/c'd - D/c A-line - Place Cortrak: past Ligament of Treitz, start tube feeds, dietitian consulted  - Pain medication prn - F/u cultures NGTD  - Abx with meropenem  - lactate wnl, will stop trending  - Thiamine supplementation, 500 IV for 3 days - PTOT and Speech eval  Hypoxic respiratory failure 2/2 mucus plug: Could not maintain saturations 4/20 pm on trach color. Developed respiratory distress. Trach tube placed in stoma and suctioned mucus plug with notable improvement. Maintaining adequate saturations now on trach collar. -Trach collar  -CTM  Ventilator induced respiratory alkalosis: Initial ABG on 4/20 revealed respiratory acidosis, however pt developed a resp alkalosis once ventilated. Repeat ABG shows improvement  - Switch to trach collar in the setting of hyperventilation on ventilator.  -Resolved  Pancreatitis: h/o pancreatitis prior to admission. Current EtOh intake unknown. Lipase >2,000, now 900. Abdomen appears tender on exam, peripancreatic improving on 48 hr CT, with no signs of pancreatic necrosis.  - Continue Tube feeds - Pain meds prn and feeds per above.  - Trend Lipase 2x weekly  Metabolic acidosis 2/2 Lactic acidosis:  - Improved, gap currently closed.  -Resolved  Pancytopenia: In setting of systemic inflammation and severe pancreatitis. H/o cirrhosis  and malignancy. Initially presented in decreased cells lines. Hgb now 6.9 and Plts continue to downtrend to 9 from 13.  - Transfused 1 unit pRBC - Transfuse 1 unit Plts - Follow up post transfusion cbc - CTM - Will require close outpatient follow up given h/o malignancy  Hypothermia: 33 F with Bair hugger in place. Currently Bair hugger to air.  - CTM  Hypokalemia/Hypophosphatemia/hypomagnesemia:  - Daily monitor with BMP, mag, phos - replete prn  Hypocalcemia: Albumin currently 1.9, serum calcium 5.8 and ionized 0.82. Facial twitching noted on exam. Hypotension persisting despite fluid management, though on low dose pressors. - Consider IV calcium gluconate - CTM   Cirrhosis 2/2 h/o EtOH abuse: Quit in 2017. AST/ALT mildly elevated. Pancytopenia. T. Bili elevated. Cannot r/o hepatic encephalopathy. - Thiamine 500 mg  - Mentation improving, will hold off on lactulose at this time - CTM - Restart lactulose if patient develops worsening encephalopathy.   Hypoglycemia Malnutrition:  Chart noted for 30 lb weight loss over the last year. Family reports most of the loss has been recent. Concerning in the setting of h/o malignancy. Social setting unclear.  - CBG monitoring - Cortrack in place - Consult dietician tube feeds - Close Outpatient f/u - SLP eval  Penile lesion: increased edema at scrotum and penile shaft noted since admission, along with eschar noted at distal portion. Urology consulted and recommendations are for placement of Xeroform gauze with no plans for surgical intervention.  - CTM, reconsult urology if symptoms worsen  Prostate cancer: s/p RT with Dr. Kathie Rhodes and has not had follow up since 2018.  - Outpatient follow-up with Urology.   Elevated TSH  Elevated TSH at 37, now 30. Free T4 is normal at 0.7. Likely sick thyroid,  will hold  thyroid hormone replacement at this time.     Best practice:  Diet: Tube feeds, Cortrak  Pain/Anxiety/Delirium protocol  (if indicated): Prn meds VAP protocol (if indicated): N/A => trach collar.  DVT prophylaxis: holding d/t thrombocytopenia  GI prophylaxis: protonix Glucose control: Hypoglycemic: improved CTM Mobility: Bedrest Code Status: Full Family Communication: Sister aware of intubation as she was bedside 4/20 pm Disposition: ICU  Labs   CBC: Recent Labs  Lab 12/21/2019 1124 01/01/2020 1142 12/23/19 0039 12/23/19 0039 12/23/19 0754 12/23/19 0754 12/24/19 0006 12/24/19 1657 12/24/19 1944 12/25/19 0342 12/25/19 0634 12/25/19 0732 12/26/19 0331  WBC 5.1   < > 3.2*  --  5.5  --  5.0  --   --   --  5.6  --  4.9  NEUTROABS 4.7  --   --   --   --   --  4.6  --   --   --  5.1  --   --   HGB 10.9*   < > 6.5*   < > 8.3*   < > 8.0*   < > 7.5* 7.5* 7.6* 8.5* 6.9*  HCT 31.6*   < > 19.0*   < > 24.0*   < > 22.3*   < > 22.0* 22.0* 21.6* 25.0* 20.1*  MCV 97.5   < > 99.0  --  92.3  --  89.2  --   --   --  92.3  --  93.5  PLT 91*   < > 44*  --  36*  --  24*  --   --   --  13*  --  9*   < > = values in this interval not displayed.    Basic Metabolic Panel: Recent Labs  Lab 12/23/19 0039 12/23/19 0039 12/24/19 0006 12/24/19 0006 12/24/19 1031 12/24/19 1657 12/24/19 1944 12/25/19 0342 12/25/19 0732 12/25/19 0958 12/26/19 0331  NA 135   < > 137   < > 137   < > 138 135 134* 135 137  K 3.4*   < > 2.4*   < > 2.8*   < > 3.0* 2.8* 2.7* 2.7* 3.3*  CL 103  --  100  --  96*  --   --   --   --  94* 99  CO2 10*  --  21*  --  27  --   --   --   --  29 29  GLUCOSE 68*  --  113*  --  146*  --   --   --   --  137* 120*  BUN 75*  --  49*  --  39*  --   --   --   --  29* 26*  CREATININE 2.62*  --  2.03*  --  1.82*  --   --   --   --  1.53* 1.41*  CALCIUM 7.7*  --  6.9*  --  6.4*  --   --   --   --  5.8* 6.4*  MG 1.5*  --  1.6*  --  1.9  --   --   --   --   --   --   PHOS 1.0*  --  1.1*  --  2.9  --   --   --   --   --   --    < > = values in this interval not displayed.   GFR: Estimated Creatinine  Clearance: 46.5 mL/min (A) (  by C-G formula based on SCr of 1.41 mg/dL (H)). Recent Labs  Lab 12/23/19 0039 12/23/19 0117 12/23/19 0754 12/23/19 1042 12/24/19 0006 12/24/19 0006 12/24/19 0725 12/24/19 1508 12/25/19 0323 12/25/19 0634 12/25/19 1048 12/26/19 0331  PROCALCITON 1.73  --   --   --  2.01  --   --   --  2.67  --   --   --   WBC 3.2*   < > 5.5  --  5.0  --   --   --   --  5.6  --  4.9  LATICACIDVEN  --    < >  --    < > 8.1*   < > 5.9* 4.0*  --   --  2.0* 1.7   < > = values in this interval not displayed.    Liver Function Tests: Recent Labs  Lab 12/30/2019 1124 12/23/19 0039 12/25/19 0958 12/26/19 0331  AST 125* 84*  83* 55* 73*  ALT 78* 45*  45* 38 46*  ALKPHOS 189* 101  101 189* 292*  BILITOT 2.5* 1.9*  1.8* 1.5* 1.3*  PROT 7.3 4.4*  4.4* 4.4* 4.3*  ALBUMIN 3.4* 2.0*  2.1* 1.9* 1.7*   Recent Labs  Lab 12/30/2019 1124 12/23/19 0039 12/24/19 1031  LIPASE 2,648* 2,176* 959*   No results for input(s): AMMONIA in the last 168 hours.  ABG    Component Value Date/Time   PHART 7.599 (H) 12/25/2019 0732   PCO2ART 33.7 12/25/2019 0732   PO2ART 133.0 (H) 12/25/2019 0732   HCO3 33.1 (H) 12/25/2019 0732   TCO2 34 (H) 12/25/2019 0732   ACIDBASEDEF 13.0 (H) 12/23/2019 0029   O2SAT 99.0 12/25/2019 0732     Coagulation Profile: Recent Labs  Lab 12/06/2019 1124 12/23/19 0245  INR 1.1 1.6*    Cardiac Enzymes: No results for input(s): CKTOTAL, CKMB, CKMBINDEX, TROPONINI in the last 168 hours.  HbA1C: Hemoglobin A1C  Date/Time Value Ref Range Status  05/02/2011 11:52 AM 5.2  Final    CBG: Recent Labs  Lab 12/25/19 2000 12/25/19 2336 12/26/19 0338 12/26/19 0805 12/26/19 1132  GLUCAP 121* 127* 102* 106* 117*    Review of Systems:   Unable to assess given current mental status.  Past Medical History  He,  has a past medical history of Abnormal LFTs, Cancer ([place]), Chronic back pain, Chronic knee pain, Cirrhosis ([place]), Fatty liver, Gout, Hiatal  hernia, Hypertension, Pancreatic lesion, Pancreatitis, and Prostate cancer ([place]).   Surgical History    Past Surgical History:  Procedure Laterality Date  . ESOPHAGOGASTRODUODENOSCOPY N/A 03/15/2016   Procedure: ESOPHAGOGASTRODUODENOSCOPY (EGD);  Surgeon: Doran Stabler, MD;  Location: [facility];  Service: Endoscopy;  Laterality: N/A;  . KNEE SURGERY     left  . PANENDOSCOPY N/A 03/18/2016   Procedure: DIRECT LARYNGOSCOPY, WITH BRONCHOSCOPY, AND ESOPHAGOSCOPY, WITH BIOPSY;  Surgeon: [name], MD;  Location: [facility];  Service: ENT;  Laterality: N/A;  . PROSTATE BIOPSY       Social History   reports that he quit smoking about 3 years ago. His smoking use included cigarettes. He has a 9.00 pack-year smoking history. He has never used smokeless tobacco. He reports current alcohol use of about 120.0 standard drinks of alcohol per week. He reports that he does not use drugs.   Family History   His family history includes Diabetes in his maternal grandmother and mother; Kidney disease in his sister; Throat cancer (age of onset: 61) in his brother.  Allergies Allergies  Allergen Reactions  . Penicillins Hives and Rash    Has patient had a PCN reaction causing immediate rash, facial/tongue/throat swelling, SOB or lightheadedness with hypotension:Yes Has patient had a PCN reaction causing severe rash involving mucus membranes or skin necrosis:unsure Has patient had a PCN reaction that required hospitalization:No Has patient had a PCN reaction occurring within the last 10 years:No  If all of the above answers are "NO", then may proceed with Cephalosporin use. Has patient had a PCN reaction causing immediate rash, facial/tongue/throat swelling, SOB or lightheadedness with hypotension:Yes Has patient had a PCN reaction causing severe rash involving mucus membranes or skin necrosis:unsure Has patient had a PCN reaction that required hospitalization:No Has patient had a PCN reaction  occurring within the last 10 years:No  If all of the above answers are "NO", then may proceed with Cephalosporin use.      Home Medications  Prior to Admission medications   Medication Sig Start Date End Date Taking? Authorizing Provider  albuterol (PROVENTIL) (2.5 MG/3ML) 0.083% nebulizer solution Take 3 mLs (2.5 mg total) by nebulization every 6 (six) hours as needed for wheezing or shortness of breath. 03/19/16   Florencia Reasons, MD  allopurinol (ZYLOPRIM) 100 MG tablet Take 1 tablet (100 mg total) by mouth daily. 09/20/16   [name], FNP  amLODipine (NORVASC) 10 MG tablet TAKE 1 TABLET(10 MG) BY MOUTH DAILY Patient not taking: Reported on 12/27/2019 03/13/17   [name], FNP  amLODipine (NORVASC) 10 MG tablet TAKE 1 TABLET(10 MG) BY MOUTH DAILY Patient taking differently: Take 10 mg by mouth daily.  04/18/17   [name], FNP  aspirin 81 MG chewable tablet Chew 81 mg by mouth daily.    [provider]  cyclobenzaprine (FLEXERIL) 5 MG tablet TAKE 1 TABLET(5 MG) BY MOUTH THREE TIMES DAILY AS NEEDED FOR MUSCLE SPASMS Patient taking differently: Take 5 mg by mouth 3 (three) times daily as needed for muscle spasms.  05/29/17   [name], FNP  feeding supplement, ENSURE ENLIVE, (ENSURE ENLIVE) LIQD Take 237 mLs by mouth 3 (three) times daily between meals. 03/19/16   Florencia Reasons, MD  ferrous sulfate 325 (65 FE) MG tablet Take 325 mg by mouth daily with breakfast.    [provider]  Multiple Vitamin (MULTIVITAMIN) tablet Take 1 tablet by mouth daily.    [provider]  NEOMYCIN-POLYMYXIN-HYDROCORTISONE (CORTISPORIN) 1 % SOLN OTIC solution Place 3 drops into the left ear 4 (four) times daily. 04/28/17   Biagio Borg, MD  potassium chloride (K-DUR) 10 MEQ tablet Take 1 tablet (10 mEq total) by mouth daily. 10/27/16   [name], FNP  propranolol (INDERAL) 10 MG tablet TAKE 1 TABLET(10 MG) BY MOUTH TWICE DAILY Patient taking differently: Take 10 mg by  mouth 2 (two) times daily.  10/27/16   [name], FNP  sucralfate (CARAFATE) 1 g tablet Take 1 g by mouth 2 (two) times daily. 09/26/19   [provider]  traZODone (DESYREL) 50 MG tablet Take 0.5-1 tablets (25-50 mg total) by mouth at bedtime as needed for sleep. 10/27/16   [name], FNP     Critical care time:   This note was initiated by Carolan Shiver. Meda Coffee, MS4  Reviewed and edited by  Marianna Payment, D.O. [facility], PGY-1 Pager: [phone], Phone: [phone] Date 12/26/2019 Time 12:01 PM

## 2019-12-26 NOTE — Progress Notes (Signed)
[place] Progress Note Patient Name: Adam Marshall DOB: 14-Dec-1960 MRN: [id number]   Date of Service  12/26/2019  HPI/Events of Note  Hemoglobin 6.9, Total calcium 6.4, Platelet count 9  eICU Interventions  Transfuse 1 unit PRBC, transfuse 1 platelet unit, Calcium gluconate 1 gm iv x 1        [name] U Nayel Purdy 12/26/2019, 4:46 AM

## 2019-12-26 NOTE — Progress Notes (Addendum)
CRITICAL VALUE ALERT  Critical Value:  Hgb 6.9, platelets 9.0, calcium 6.4  Date & Time Notied:  12/26/2019 @ 0412  Provider Notified: Warren Lacy MD  Orders Received/Actions taken: Awaiting orders   Jackalyn Lombard

## 2019-12-27 DIAGNOSIS — J9601 Acute respiratory failure with hypoxia: Secondary | ICD-10-CM | POA: Diagnosis not present

## 2019-12-27 DIAGNOSIS — K8522 Alcohol induced acute pancreatitis with infected necrosis: Secondary | ICD-10-CM | POA: Diagnosis not present

## 2019-12-27 DIAGNOSIS — R579 Shock, unspecified: Secondary | ICD-10-CM | POA: Diagnosis not present

## 2019-12-27 DIAGNOSIS — N179 Acute kidney failure, unspecified: Secondary | ICD-10-CM | POA: Diagnosis not present

## 2019-12-27 DIAGNOSIS — K859 Acute pancreatitis without necrosis or infection, unspecified: Secondary | ICD-10-CM | POA: Diagnosis not present

## 2019-12-27 LAB — PREPARE PLATELET PHERESIS: Unit division: 0

## 2019-12-27 LAB — CULTURE, BLOOD (ROUTINE X 2)
Culture: NO GROWTH
Culture: NO GROWTH
Special Requests: ADEQUATE

## 2019-12-27 LAB — BPAM RBC
Blood Product Expiration Date: 202104222359
Blood Product Expiration Date: 202105092359
ISSUE DATE / TIME: 202104190438
ISSUE DATE / TIME: 202104220531
Unit Type and Rh: 7300
Unit Type and Rh: 9500

## 2019-12-27 LAB — CBC WITH DIFFERENTIAL/PLATELET
Abs Immature Granulocytes: 0.13 10*3/uL — ABNORMAL HIGH (ref 0.00–0.07)
Basophils Absolute: 0 10*3/uL (ref 0.0–0.1)
Basophils Relative: 0 %
Eosinophils Absolute: 0 10*3/uL (ref 0.0–0.5)
Eosinophils Relative: 1 %
HCT: 24.3 % — ABNORMAL LOW (ref 39.0–52.0)
Hemoglobin: 8.5 g/dL — ABNORMAL LOW (ref 13.0–17.0)
Immature Granulocytes: 2 %
Lymphocytes Relative: 3 %
Lymphs Abs: 0.2 10*3/uL — ABNORMAL LOW (ref 0.7–4.0)
MCH: 31.3 pg (ref 26.0–34.0)
MCHC: 35 g/dL (ref 30.0–36.0)
MCV: 89.3 fL (ref 80.0–100.0)
Monocytes Absolute: 0.3 10*3/uL (ref 0.1–1.0)
Monocytes Relative: 5 %
Neutro Abs: 4.7 10*3/uL (ref 1.7–7.7)
Neutrophils Relative %: 89 %
Platelets: 27 10*3/uL — CL (ref 150–400)
RBC: 2.72 MIL/uL — ABNORMAL LOW (ref 4.22–5.81)
RDW: 15.9 % — ABNORMAL HIGH (ref 11.5–15.5)
WBC: 5.4 10*3/uL (ref 4.0–10.5)
nRBC: 0 % (ref 0.0–0.2)

## 2019-12-27 LAB — TYPE AND SCREEN
ABO/RH(D): AB POS
Antibody Screen: NEGATIVE
Unit division: 0
Unit division: 0

## 2019-12-27 LAB — GLUCOSE, CAPILLARY
Glucose-Capillary: 108 mg/dL — ABNORMAL HIGH (ref 70–99)
Glucose-Capillary: 101 mg/dL — ABNORMAL HIGH (ref 70–99)
Glucose-Capillary: 106 mg/dL — ABNORMAL HIGH (ref 70–99)
Glucose-Capillary: 110 mg/dL — ABNORMAL HIGH (ref 70–99)
Glucose-Capillary: 123 mg/dL — ABNORMAL HIGH (ref 70–99)

## 2019-12-27 LAB — CALCIUM, IONIZED: Calcium, Ionized, Serum: 3.9 mg/dL — ABNORMAL LOW (ref 4.5–5.6)

## 2019-12-27 LAB — CULTURE, RESPIRATORY W GRAM STAIN: Culture: NORMAL

## 2019-12-27 LAB — BPAM PLATELET PHERESIS
Blood Product Expiration Date: 202104222359
ISSUE DATE / TIME: 202104220738
Unit Type and Rh: 5100

## 2019-12-27 LAB — BASIC METABOLIC PANEL
Anion gap: 8 (ref 5–15)
BUN: 26 mg/dL — ABNORMAL HIGH (ref 6–20)
CO2: 26 mmol/L (ref 22–32)
Calcium: 7.2 mg/dL — ABNORMAL LOW (ref 8.9–10.3)
Chloride: 103 mmol/L (ref 98–111)
Creatinine, Ser: 0.95 mg/dL (ref 0.61–1.24)
GFR calc Af Amer: >60 mL/min (ref >60)
GFR calc non Af Amer: >60 mL/min (ref >60)
Glucose, Bld: 143 mg/dL — ABNORMAL HIGH (ref 70–99)
Potassium: 3.1 mmol/L — ABNORMAL LOW (ref 3.5–5.1)
Sodium: 137 mmol/L (ref 135–145)

## 2019-12-27 LAB — PHOSPHORUS: Phosphorus: <1 mg/dL — CL (ref 2.5–4.6)

## 2019-12-27 LAB — MAGNESIUM: Magnesium: 1.1 mg/dL — ABNORMAL LOW (ref 1.7–2.4)

## 2019-12-27 MED ORDER — CALCIUM GLUCONATE-NACL 1-0.675 GM/50ML-% IV SOLN: 1.0000 g | Freq: Once | INTRAVENOUS | Status: DC

## 2019-12-27 MED ORDER — POTASSIUM CHLORIDE 20 MEQ PO PACK
40.0000 meq | PACK | ORAL | Status: DC
  Administered 2019-12-27: 40 meq
  Filled 2019-12-27: qty 2

## 2019-12-27 MED ORDER — CALCIUM GLUCONATE-NACL 1-0.675 GM/50ML-% IV SOLN
1.0000 g | Freq: Once | INTRAVENOUS | Status: AC
  Administered 2019-12-27: 1000 mg via INTRAVENOUS
  Filled 2019-12-27: qty 50

## 2019-12-27 MED ORDER — ADULT MULTIVITAMIN LIQUID CH
15.0000 mL | Freq: Every day | ORAL | Status: DC
  Administered 2019-12-27: 15 mL
  Filled 2019-12-27 (×2): qty 15

## 2019-12-27 MED ORDER — SODIUM CHLORIDE 0.9 % IV SOLN
2.0000 g | Freq: Three times a day (TID) | INTRAVENOUS | Status: DC
  Filled 2019-12-27 (×2): qty 2

## 2019-12-27 MED ORDER — SODIUM PHOSPHATES 45 MMOLE/15ML IV SOLN
10.0000 mmol | Freq: Once | INTRAVENOUS | Status: DC
  Administered 2019-12-27: 10 mmol via INTRAVENOUS
  Filled 2019-12-27: qty 3.33

## 2019-12-27 MED ORDER — MAGNESIUM SULFATE 4 GM/100ML IV SOLN
4.0000 g | Freq: Once | INTRAVENOUS | Status: AC
  Administered 2019-12-27: 4 g via INTRAVENOUS
  Filled 2019-12-27: qty 100

## 2019-12-27 MED ORDER — SODIUM CHLORIDE 0.9 % IV SOLN
1.0000 g | Freq: Three times a day (TID) | INTRAVENOUS | Status: DC
  Administered 2019-12-27: 1 g via INTRAVENOUS
  Filled 2019-12-27 (×3): qty 1

## 2019-12-27 MED ORDER — POTASSIUM PHOSPHATES 15 MMOLE/5ML IV SOLN
30.0000 mmol | Freq: Once | INTRAVENOUS | Status: AC
  Administered 2019-12-27: 30 mmol via INTRAVENOUS
  Filled 2019-12-27: qty 10

## 2019-12-27 MED ORDER — OXYCODONE HCL 5 MG PO TABS: 5.0000 mg | ORAL_TABLET | Freq: Four times a day (QID) | ORAL | Status: DC | PRN

## 2019-12-27 MED ORDER — POTASSIUM CHLORIDE 20 MEQ PO PACK: 40.0000 meq | PACK | ORAL | Status: DC

## 2019-12-27 MED ORDER — POTASSIUM CITRATE-CITRIC ACID 1100-334 MG/5ML PO SOLN
40.0000 meq | ORAL | Status: DC
  Filled 2019-12-27: qty 20

## 2019-12-27 MED ORDER — POTASSIUM CHLORIDE CRYS ER 20 MEQ PO TBCR: 40.0000 meq | EXTENDED_RELEASE_TABLET | ORAL | Status: DC

## 2019-12-27 NOTE — Progress Notes (Signed)
PROGRESS NOTE    EDAHI KIRTZ  [id number] DOB: 01/13/1961 DOA: 12/20/2019 PCP: [name], FNP    Brief Narrative:  Patient was admitted to the hospital with a working diagnosis of septic shock due to acute pancreatitis, complicated by hypothermia, respiratory failure, metabolic encephalopathy and acute kidney injury.  59 year old male who presented with altered mental status.  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 He also has prostate cancer, and alcoholic cirrhosis.  Patient had several days of altered mentation, EMS was called and patient was found hypotensive, on arrival to the ED his temperature was 83.3, blood pressure 89/50, heart rate 49.  He was cachectic and ill looking appearance, positive systolic murmur, his lungs had decreased breath sounds bilaterally, his abdomen was tender in the epigastric region and left upper quadrant, no lower extremity edema.  Patient was unable to follow commands but no signs of focality.  Patient was placed on broad-spectrum antibiotic therapy and vasopressors.  On April 20 he developed respiratory distress, a Shiley 8 tracheostomy tube was inserted and patient was placed on mechanical ventilation.  The following day on April 21 she was transitioned back to trach collar. Patient had an NG tube placed for feedings.   Transferred to [facility] on 12/27/19.   Assessment & Plan:   Active Problems:   Respiratory failure ([place])   Shock ([place])   Acute kidney injury ([place])   Acute pancreatitis   1. Septic shock (end-organ failure hypotension), due to acute alcoholic pancreatitis (present on admssion). Patient now hemodynamically stable, with blood pressure 110/73 mmHg, he has been off vasopressors. CT imaging with no abscess (04/21).   Continue antibiotic therapy with meropenem, to complete 7 days. Continue feeding per  NG tube, will consult speech therapy for further evaluation.  Patient continue to be high risk for worsening hemodynamics, will continue close monitoring in the progressive care unit.   2. Acute hypoxemic respiratory failure. Patient tolerating trach collar, no increase work of breathing. Currently patient has a cuffed trach. Continue aspiration precautions and close oxymetry monitoring.   3. Liver cirrhosis due to alcohol/ Pancytopenia. Patient is sp 2 units PRBC transfusion since admission, his Hgb this am is 8,4 with Hct at 25. Sp one pool of platelets with Plt up to 34 from 9.   No signs of active bleeding, will follow cell count in am. Refeeding syndrome, can trigger hemolysis.   4. AKI, Hypoglycemia, Hypokalemia, hypophosphatemia, hypocalcemia and hypomagnesemia/ severe calorie protein malnutrition with refeeding syndrome. Renal function with serum cr at 1,41 with K at 3,3 with serum bicarbonate at 29 and cl at 99. CA 6,4.  Will continue K correction with Kc per NG 80 meq in 2 divided doses, continue ca correction with calcium gluconate. Check P and Mg in am. Continue nutrition per NG.   5. Penile lesion and Hx of prostate cancer. Continue with local wound care.   6. Metabolic encephalopathy. Patient is able to respond to simple questions, continue on mittens bilaterally to protect lines and tubes. Continue with thiamine and multivitamins.   Right subclavian central line. 04/20  DVT prophylaxis: scd  Code Status:   full  Family Communication:  No family at the bedside   Disposition Plan:   Patient is from:  Home   Anticipated DC to:  snf   Anticipated DC date:  To be determined  Anticipated DC barriers: Patient critically ill  needing close monitoring of electrolytes and hemodynamics.      Nutrition Status: Nutrition Problem: Severe Malnutrition Etiology: chronic illness(SCC of pyrifoam sinus s/p laryngectomy and neck dissection) Signs/Symptoms: severe fat depletion, severe  muscle depletion Interventions: Tube feeding    Consultants:   Urology   Procedures:   Right subclavian central line 04/20  Antimicrobials:   Meropenem     Subjective: Patient is awake and able to communicate with his head, no apparent pain, no nausea or vomiting. Continue on bilateral hands mittens.   Objective: Vitals:   12/27/19 0600 12/27/19 0630 12/27/19 0700 12/27/19 0800  BP:    110/73  Pulse: 91 93 96 96  Resp: 15 15 18 17   Temp: 98.2 F (36.8 C) 98.2 F (36.8 C) 98.2 F (36.8 C) 98.2 F (36.8 C)  TempSrc:      SpO2: 100% 100% 100% 100%  Weight:  58.1 kg    Height:        Intake/Output Summary (Last 24 hours) at 12/27/2019 0809 Last data filed at 12/27/2019 0800 Gross per 24 hour  Intake 2424.68 ml  Output 2130 ml  Net 294.68 ml   Filed Weights   12/25/19 0458 12/26/19 0341 12/27/19 0630  Weight: 58.3 kg 58.3 kg 58.1 kg    Examination:   General: Not in pain or dyspnea, deconditioned and ill looking appearing.  Neurology: Awake and alert, non focal, able to respond simple questions with his head E ENT: positive pallor, no icterus, oral mucosa moist Cardiovascular: No JVD. S1-S2 present, rhythmic, no gallops, rubs, or murmurs. No lower extremity edema. Pulmonary: positive breath sounds bilaterally, no wheezing, but bilatearl proximal rhonchi and rales. Gastrointestinal. Abdomen mild distended and firm, mild tender to palpation, with  no organomegaly or rebound. Skin. No rashes Musculoskeletal: no joint deformities     Data Reviewed: I have personally reviewed following labs and imaging studies  CBC: Recent Labs  Lab 01/01/2020 1124 12/08/2019 1142 12/23/19 0754 12/23/19 0754 12/24/19 0006 12/24/19 1657 12/25/19 0342 12/25/19 0634 12/25/19 0732 12/26/19 0331 12/26/19 1820  WBC 5.1   < > 5.5  --  5.0  --   --  5.6  --  4.9 6.1  NEUTROABS 4.7  --   --   --  4.6  --   --  5.1  --   --   --   HGB 10.9*   < > 8.3*   < > 8.0*   < > 7.5* 7.6*  8.5* 6.9* 8.4*  HCT 31.6*   < > 24.0*   < > 22.3*   < > 22.0* 21.6* 25.0* 20.1* 24.5*  MCV 97.5   < > 92.3  --  89.2  --   --  92.3  --  93.5 90.1  PLT 91*   < > 36*  --  24*  --   --  13*  --  9* 34*   < > = values in this interval not displayed.   Basic Metabolic Panel: Recent Labs  Lab 12/23/19 0039 12/23/19 0039 12/24/19 0006 12/24/19 0006 12/24/19 1031 12/24/19 1657 12/24/19 1944 12/25/19 0342 12/25/19 0732 12/25/19 0958 12/26/19 0331  NA 135   < > 137   < > 137   < > 138 135 134* 135 137  K 3.4*   < > 2.4*   < > 2.8*   < > 3.0* 2.8* 2.7* 2.7* 3.3*  CL 103  --  100  --  96*  --   --   --   --  94* 99  CO2 10*  --  21*  --  27  --   --   --   --  29 29  GLUCOSE 68*  --  113*  --  146*  --   --   --   --  137* 120*  BUN 75*  --  49*  --  39*  --   --   --   --  29* 26*  CREATININE 2.62*  --  2.03*  --  1.82*  --   --   --   --  1.53* 1.41*  CALCIUM 7.7*  --  6.9*  --  6.4*  --   --   --   --  5.8* 6.4*  MG 1.5*  --  1.6*  --  1.9  --   --   --   --   --   --   PHOS 1.0*  --  1.1*  --  2.9  --   --   --   --   --   --    < > = values in this interval not displayed.   GFR: Estimated Creatinine Clearance: 46.4 mL/min (A) (by C-G formula based on SCr of 1.41 mg/dL (H)). Liver Function Tests: Recent Labs  Lab 12/10/2019 1124 12/23/19 0039 12/25/19 0958 12/26/19 0331  AST 125* 84*  83* 55* 73*  ALT 78* 45*  45* 38 46*  ALKPHOS 189* 101  101 189* 292*  BILITOT 2.5* 1.9*  1.8* 1.5* 1.3*  PROT 7.3 4.4*  4.4* 4.4* 4.3*  ALBUMIN 3.4* 2.0*  2.1* 1.9* 1.7*   Recent Labs  Lab 12/16/2019 1124 12/23/19 0039 12/24/19 1031  LIPASE 2,648* 2,176* 959*   No results for input(s): AMMONIA in the last 168 hours. Coagulation Profile: Recent Labs  Lab 12/21/2019 1124 12/23/19 0245  INR 1.1 1.6*   Cardiac Enzymes: No results for input(s): CKTOTAL, CKMB, CKMBINDEX, TROPONINI in the last 168 hours. BNP (last 3 results) No results for input(s): PROBNP in the last 8760  hours. HbA1C: No results for input(s): HGBA1C in the last 72 hours. CBG: Recent Labs  Lab 12/26/19 1132 12/26/19 1536 12/26/19 1945 12/26/19 2340 12/27/19 0345  GLUCAP 117* 101* 97 115* 108*   Lipid Profile: No results for input(s): CHOL, HDL, LDLCALC, TRIG, CHOLHDL, LDLDIRECT in the last 72 hours. Thyroid Function Tests: No results for input(s): TSH, T4TOTAL, FREET4, T3FREE, THYROIDAB in the last 72 hours. Anemia Panel: Recent Labs    12/26/19 0331  RETICCTPCT <0.4*      Radiology Studies: I have reviewed all of the imaging during this hospital visit personally     Scheduled Meds: . chlorhexidine  15 mL Mouth Rinse BID  . Chlorhexidine Gluconate Cloth  6 each Topical Daily  . mouth rinse  15 mL Mouth Rinse q12n4p  . pantoprazole sodium  40 mg Per Tube Daily  . sodium chloride flush  10-40 mL Intracatheter Q12H   Continuous Infusions: . sodium chloride 10 mL/hr at 12/25/19 1600  . sodium chloride 10 mL/hr at 12/27/19 0800  . feeding supplement (VITAL AF 1.2 CAL) 70 mL/hr at 12/26/19 2300  . meropenem (MERREM) IV Stopped (12/27/19 0225)  . thiamine injection       LOS: 5 days        Havish Petties Gerome Apley, MD

## 2019-12-27 NOTE — Progress Notes (Signed)
Dr. Cathlean Sauer updated to critical labs of phos < 1.0 and plt # 27. Orders recvd.

## 2019-12-27 NOTE — Progress Notes (Deleted)
DC central line order placed in error

## 2019-12-27 NOTE — Progress Notes (Signed)
Report given via phone to Judson Roch, Therapist, sports.  Will call family and make them aware of transfer. Patient resting comfortably in bed.

## 2019-12-27 NOTE — Progress Notes (Addendum)
Pt received on the unit  Folley in place due to prostate CA and penile  wound  Trach connected to collar at 5L 28% NG tube intact and running at 70  Rectal pouch intact  Central line intact patent and dressing is intact  Pt is alert  BP 117/77   Pulse 87   Temp (!) 97.5 F (36.4 C) (Oral)   Resp 16   Ht 5\' 6"  (1.676 m)   Wt 58.1 kg   SpO2 100%   BMI 20.67 kg/m   will continue to monitor

## 2019-12-27 NOTE — Progress Notes (Signed)
  Speech Language Pathologyreatment:    Patient Details Name: Adam Marshall MRN: [id number] DOB: [birth year] Today's Date: 12/27/2019 Time:  -      Received order for Passy-Muir speaking valve and swallow assessment. Pt's chart reviewed and documents from [facility] stating he is a total laryngectomy and in MD documented on physical exam "voice: alaryngeal".  From oropharyngeal swallow standpoint pt is incapable of aspirating unless there is a trachoesophageal fistula. No ST warranted.                       Houston Siren 12/27/2019, 4:25 PM  Orbie Pyo Colvin Caroli.Ed Risk analyst [phone] Office [phone]

## 2019-12-27 NOTE — Evaluation (Signed)
Physical Therapy Evaluation Patient Details Name: Adam Marshall MRN: [id number] DOB: 1960-10-04 Today's Date: 12/27/2019   History of Present Illness  pt is a 59 year old male with PMH significant for squamous cell carcinoma of the sinus s/p laryngectomy/neck dissection/left PMMF in [id number], alcoholic cirrhosis, hx of pancreatitis who presents with altered mental status and found to be hypothermic and hypotensive. Admitted to ICU for septic shock secondary to possible GI etiology in the setting of recent nausea and vomiting. Patient unable to provide history. Family unable to provide history. Chart reviewed.  Clinical Impression  Pt admitted with/for AMS with other complications.  Pt needing maximal assist for basic mobility OOB and to the chair.  Hopefully, his mobility and ability to relate home and PLOF information will improve soon when/if mentation clears..  Pt currently limited functionally due to the problems listed. ( See problems list.)   Pt will benefit from PT to maximize function and safety in order to get ready for next venue listed below.     Follow Up Recommendations Supervision/Assistance - 24 hour;CIR    Equipment Recommendations  Other (comment)(TBA)    Recommendations for Other Services       Precautions / Restrictions Precautions Precautions: Fall      Mobility  Bed Mobility Overal bed mobility: Needs Assistance Bed Mobility: Rolling;Sidelying to Sit Rolling: Max assist Sidelying to sit: Max assist       General bed mobility comments: directional cues and truncal/LE assist up via R elbow.  Transfers Overall transfer level: Needs assistance Equipment used: None Transfers: Sit to/from [employer] Sit to Stand: Max assist;+2 safety/equipment   Squat pivot transfers: Max assist;+2 physical assistance     General transfer comment: pt needed directional cues and face to face assist to stand and pivot.  Hand over hand assist to get pt to  reach back for chair arm for sitting.  Ambulation/Gait                Stairs            Wheelchair Mobility    Modified Rankin (Stroke Patients Only)       Balance Overall balance assessment: Needs assistance Sitting-balance support: No upper extremity supported Sitting balance-Leahy Scale: Fair     Standing balance support: Bilateral upper extremity supported;Single extremity supported;During functional activity Standing balance-Leahy Scale: Poor Standing balance comment: pt unable to bear full weight on bil LE                             Pertinent Vitals/Pain Pain Assessment: Faces Faces Pain Scale: Hurts little more Pain Location: generalized Pain Descriptors / Indicators: Grimacing;Guarding Pain Intervention(s): Monitored during session;Limited activity within patient's tolerance    Home Living Family/patient expects to be discharged to:: Unsure                 Additional Comments: pt supposedly has a room mate.  Unable to determine any other information about the living situation    Prior Function           Comments: pt unable to give reliable information on PLOF.     Hand Dominance        Extremity/Trunk Assessment   Upper Extremity Assessment Upper Extremity Assessment: Defer to OT evaluation    Lower Extremity Assessment Lower Extremity Assessment: Generalized weakness(R tests weaker than L, but MMT limited.)       Communication   Communication: No  difficulties  Cognition Arousal/Alertness: Awake/alert Behavior During Therapy: WFL for tasks assessed/performed;Flat affect Overall Cognitive Status: No family/caregiver present to determine baseline cognitive functioning                                 General Comments: pt not following commands and not able to reliably answer questions.      General Comments General comments (skin integrity, edema, etc.): vss    Exercises Other Exercises Other  Exercises: warm up LE ROM exercise.   Assessment/Plan    PT Assessment Patient needs continued PT services  PT Problem List Decreased strength;Decreased activity tolerance;Decreased balance;Decreased mobility;Decreased coordination;Decreased knowledge of use of DME;Cardiopulmonary status limiting activity;Pain       PT Treatment Interventions DME instruction;Gait training;Functional mobility training;Therapeutic activities;Balance training;Patient/family education    PT Goals (Current goals can be found in the Care Plan section)  Acute Rehab PT Goals Patient Stated Goal: pt unable PT Goal Formulation: Patient unable to participate in goal setting Time For Goal Achievement: 01/10/20 Potential to Achieve Goals: Fair    Frequency Min 3X/week   Barriers to discharge        Co-evaluation               AM-PAC PT "6 Clicks" Mobility  Outcome Measure Help needed turning from your back to your side while in a flat bed without using bedrails?: Total Help needed moving from lying on your back to sitting on the side of a flat bed without using bedrails?: Total Help needed moving to and from a bed to a chair (including a wheelchair)?: Total Help needed standing up from a chair using your arms (e.g., wheelchair or bedside chair)?: Total Help needed to walk in hospital room?: Total Help needed climbing 3-5 steps with a railing? : Total 6 Click Score: 6    End of Session Equipment Utilized During Treatment: Oxygen Activity Tolerance: Patient tolerated treatment well;Patient limited by fatigue Patient left: in chair;with call bell/phone within reach;with chair alarm set Nurse Communication: Mobility status PT Visit Diagnosis: Muscle weakness (generalized) (M62.81);Other abnormalities of gait and mobility (R26.89);Difficulty in walking, not elsewhere classified (R26.2)    Time: 1210-1230 PT Time Calculation (min) (ACUTE ONLY): 20 min   Charges:   PT Evaluation $PT Eval Moderate  Complexity: 1 Mod          12/27/2019  Adam Carne., PT Acute Rehabilitation Services [phone]  (pager) [phone]  (office)  Adam Marshall 12/27/2019, 1:49 PM

## 2019-12-27 NOTE — Progress Notes (Signed)
[place] Progress Note Patient Name: Adam Marshall DOB: 08/16/1961 MRN: [id number]   Date of Service  12/27/2019  HPI/Events of Note  Pt needs a PRN for pain  eICU Interventions  Continue Pt's PRN Acetaminophen order which is on his MAR.        Frederik Pear 12/27/2019, 9:24 PM

## 2019-12-27 NOTE — Plan of Care (Signed)
  Problem: Education: Goal: Knowledge of General Education information will improve Description: Including pain rating scale, medication(s)/side effects and non-pharmacologic comfort measures Outcome: Progressing   Problem: Health Behavior/Discharge Planning: Goal: Ability to manage health-related needs will improve Outcome: Progressing   Problem: Clinical Measurements: Goal: Ability to maintain clinical measurements within normal limits will improve Outcome: Progressing   Problem: Nutrition: Goal: Adequate nutrition will be maintained Outcome: Progressing   Problem: Safety: Goal: Ability to remain free from injury will improve Outcome: Progressing   Problem: Skin Integrity: Goal: Risk for impaired skin integrity will decrease Outcome: Progressing   

## 2019-12-27 NOTE — Progress Notes (Signed)
PHARMACY NOTE:  ANTIMICROBIAL RENAL DOSAGE ADJUSTMENT  Current antimicrobial regimen includes a mismatch between antimicrobial dosage and estimated renal function.  As per policy approved by the Pharmacy & Therapeutics and Medical Executive Committees, the antimicrobial dosage will be adjusted accordingly.  Current antimicrobial dosage: Meropenem 1g IV Q12H  Indication: Intra-abdominal infection  Renal Function:  Estimated Creatinine Clearance: 68.8 mL/min (by C-G formula based on SCr of 0.95 mg/dL). []      On intermittent HD, scheduled: []      On CRRT    Antimicrobial dosage has been changed to: Meropenem 1g IV Q8H  Additional comments: Stop date entered for 4/28 (7 day therapy)  Thank you for allowing pharmacy to be a part of this patient's care.  Kennon Holter, PharmD PGY1 Ambulatory Care Pharmacy Resident

## 2019-12-27 NOTE — Progress Notes (Addendum)
NAME:  Adam Marshall, MRN:  [id number], DOB:  Jun 18, 1961, LOS: 5 ADMISSION DATE:  12/05/2019, CONSULTATION DATE:  12/31/2019 REFERRING MD: Dr. 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   Per ED notes, EMS reports that roommate noted AMS with difficulty ambulating to bathroom requiring depends. EMS also reported initial BP of 44/20.   4/20: Experienced hypoxic respiratory failure requiring trach intubation and placed on vent. Suction revealed mucus plugs likely cause. Respiratory status improving. Hypotension continues to be an ongoing problem, however he appears fluid overloaded. Levophed started and maintaining goal MAPs. Lactate and Scr continue to downtrend. WBC and Hgb still stable, however platelets now 13 from 24. CT scan shows some improvement in peripancreatic fluid with no evidence of necrosis.   Penile and scrotal edema continue to worsen. There is now concern for vascular compromise. Urology consulted, appreciate recs.   4/21: Platelets and hgb continued to downtrend requiring 1 unit of each. Repletion of electrolytes completed as well. Overall clinical presentation appears to have improved. Patient is more responsive on exam, maintaining goal MAPs w/o pressors and satting well on trach collar.    4/22: Platelets and hgb now  stable after units. Overall clinical presentation continued to improve and patient was transitioned to progressive care.   Past Medical History  Cancer 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 Acute edematous pancreatitis. No evidence of pancreatic necrosis. 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 This is slightly decreased in size from 2017 MRI. 2. No acute peripancreatic fluid collection. Free fluid adjacent to the pancreas related to ascites but no focal fluid collection. 3. Edematous appearing distal stomach, scattered small bowel, and left colon, likely reactive, but nonspecific. 4. Small to moderate volume abdominopelvic ascites, increased from CT yesterday. Small to moderate bilateral pleural effusions also increased from yesterday. 5. Hepatic cirrhosis. Innumerable small low-density lesions within the liver are too small to characterize, may represent cysts or biliary hamartomas. This was also seen on 04/26/2016 abdominal MRI. 6. Absent renal excretion on delayed phase imaging, suggesting underlying renal dysfunction. 7. Advanced aortic atherosclerosis.   4/19 CT ABD/PELVIS w/o contrast:   IMPRESSION: 1. Redemonstration of the diffusely edematous appearing pancreas with increasing and now extensive peripancreatic inflammation. Evaluation for pancreatic necrosis is limited in the  absence  of contrast. 2. There are enlarging peripancreatic fluid collections particularly extending from the anterior pancreatic body and tail as well as a within the pancreaticoduodenal groove, lesser sac, and tracking along the pericolic gutters. Suspect these likely reflect acute peripancreatic collections in the absence of demonstrable pancreatic necrosis for the modified Lana criteria. 3. Increasing volume loss in both lower lobes, right slightly greater than left, with small bilateral pleural effusions which are new from the comparison exam. Underlying infection/inflammation is not excluded. 4. Mild gallbladder distention with intermediate attenuation (31 HU). Finding compatible with biliary sludge seen on ultrasound. 5. Diffuse gastric antral, duodenal and colonic wall thickening most pronounced in the hepatic and splenic flexures. Findings are favored to represent reactive inflammation secondary to the adjacent pancreatitis. 6. Colonic diverticulosis without evidence of diverticulitis. 7. Mild edematous changes of the external genitalia are favored to reflect volume third-spacing in the absence of additional focal features of infection. Correlate with visual inspection and exam findings as aggressive/necrotizing soft tissue infection is a clinical diagnosis. 8. Aortic Atherosclerosis (ICD10-I70.0).  4/19 Chest XR:  IMPRESSION: No active disease.  4/18 RUQ ABD U/S:  IMPRESSION: Features are suggestive of acute cholecystitis in the appropriate clinical setting though a sonographic Murphy sign was unable to be fully assessed due to an unresponsive patient.  Mild dilatation of the extrahepatic common bile duct, no visible intraductal stones. If there is concern for choledocholithiasis, MRCP could be obtained.  Diffusely increased hepatic echogenicity, most often seen with hepatic steatosis.  Edematous pancreas with surrounding peripancreatic fluid compatible with  features of pancreatitis seen on the same day CT.  4/18 CT Head w/o contrast:  IMPRESSION: 1. No acute intracranial findings. 2. Progressive periventricular white matter hypodensity compatible with chronic ischemic microvascular white matter disease. 3. Increased prominence of sulci along with increased ventricular prominence which is likely ex vacuo.  4/18 CT ABD/PELVIS:  IMPRESSION: 1. Interval acute, uncomplicated pancreatitis. 2. Diffuse low density wall thickening involving the proximal descending colon and splenic flexure, compatible with inflammatory changes due to the adjacent pancreatitis. 3. Colonic diverticulosis. 4. Sludge in the gallbladder.  It has. 5. Stable bilateral adrenal hyperplasia. 6. Foley catheter in the urinary bladder with a suggestion of moderate diffuse bladder wall thickening, possibly representing changes due to cystitis.  4/18 portable KUB:  IMPRESSION: 1. Nonobstructive bowel gas pattern. 2. Patulous loops of small bowel in the upper abdomen raising the possibility of small bowel enteritis.  4/18 Chest XR:   IMPRESSION: No active disease. No evidence of pneumonia or pulmonary edema.    Micro Data:  4/18  BCx, UCx NGTD @ 4 days  Antimicrobials:  4/18: Metronidazole x1 4/18: Cefepime >>4/20 4/18: Vancomycin >> 4/19 4/21: Meropenem >>4/27  Interim history/subjective:  See above  Objective   Blood pressure 110/73, pulse 96, temperature 98.4 F (36.9 C), resp. rate 19, height 5\' 6"  (1.676 m), weight 58.1 kg, SpO2 100 %.    FiO2 (%):  [28 %] 28 %   Intake/Output Summary (Last 24 hours) at 12/27/2019 1030 Last data filed at 12/27/2019 0800 Gross per 24 hour  Intake 2074.68 ml  Output 1980 ml  Net 94.68 ml   Filed Weights   12/25/19 0458 12/26/19 0341 12/27/19 0630  Weight: 58.3 kg 58.3 kg 58.1 kg    Examination: General: Cachetic appearing male in NAD, sitting up in bed.   HENT: [place]/AT, EOMI, MMM, Shiley trach in  place. Lungs: Diminished breath sounds with NWOB on trach collar.  Cardiovascular: RRR Abdomen: Mildly tender  and distended Extremities: warm and well perfuse.  Skin: warm and edematous   Neuro: No speech, moves extremities independently, Follows simple commands.  Psych: Improved affect, appears to nod and be more engaged in conversations on care.     Resolved Hospital Problem list   Metabolic Acidosis 2/2 Lactic Acidosis  Assessment & Plan:   Hypoxic respiratory failure 2/2 mucus plug: Could not maintain saturations 4/20 pm on trach color. Developed respiratory distress. Shiley trach tube placed in stoma and suctioned mucus plug with notable improvement. Maintaining adequate SpO2 5 L on trach collar. -Trach collar  -CTM  Ventilator induced respiratory alkalosis: Initial ABG on 4/20 revealed respiratory acidosis, however pt developed a resp alkalosis once ventilated. Repeat ABG shows improvement  - Switch to trach collar in the setting of hyperventilation on ventilator.  -Resolved  Transferred to [facility] on 4/22: see TRH note for plan concerning remaining problems.    Labs   CBC: Recent Labs  Lab 12/27/2019 1124 12/16/2019 1142 12/23/19 0754 12/23/19 0754 12/24/19 0006 12/24/19 1657 12/25/19 0342 12/25/19 0634 12/25/19 0732 12/26/19 0331 12/26/19 1820  WBC 5.1   < > 5.5  --  5.0  --   --  5.6  --  4.9 6.1  NEUTROABS 4.7  --   --   --  4.6  --   --  5.1  --   --   --   HGB 10.9*   < > 8.3*   < > 8.0*   < > 7.5* 7.6* 8.5* 6.9* 8.4*  HCT 31.6*   < > 24.0*   < > 22.3*   < > 22.0* 21.6* 25.0* 20.1* 24.5*  MCV 97.5   < > 92.3  --  89.2  --   --  92.3  --  93.5 90.1  PLT 91*   < > 36*  --  24*  --   --  13*  --  9* 34*   < > = values in this interval not displayed.    Basic Metabolic Panel: Recent Labs  Lab 12/23/19 0039 12/23/19 0039 12/24/19 0006 12/24/19 0006 12/24/19 1031 12/24/19 1657 12/24/19 1944 12/25/19 0342 12/25/19 0732 12/25/19 0958 12/26/19 0331  NA 135    < > 137   < > 137   < > 138 135 134* 135 137  K 3.4*   < > 2.4*   < > 2.8*   < > 3.0* 2.8* 2.7* 2.7* 3.3*  CL 103  --  100  --  96*  --   --   --   --  94* 99  CO2 10*  --  21*  --  27  --   --   --   --  29 29  GLUCOSE 68*  --  113*  --  146*  --   --   --   --  137* 120*  BUN 75*  --  49*  --  39*  --   --   --   --  29* 26*  CREATININE 2.62*  --  2.03*  --  1.82*  --   --   --   --  1.53* 1.41*  CALCIUM 7.7*  --  6.9*  --  6.4*  --   --   --   --  5.8* 6.4*  MG 1.5*  --  1.6*  --  1.9  --   --   --   --   --   --  PHOS 1.0*  --  1.1*  --  2.9  --   --   --   --   --   --    < > = values in this interval not displayed.   GFR: Estimated Creatinine Clearance: 46.4 mL/min (A) (by C-G formula based on SCr of 1.41 mg/dL (H)). Recent Labs  Lab 12/23/19 0039 12/23/19 0117 12/24/19 0006 12/24/19 0006 12/24/19 0725 12/24/19 1508 12/25/19 0323 12/25/19 0634 12/25/19 1048 12/26/19 0331 12/26/19 1820  PROCALCITON 1.73  --  2.01  --   --   --  2.67  --   --   --   --   WBC 3.2*   < > 5.0  --   --   --   --  5.6  --  4.9 6.1  LATICACIDVEN  --    < > 8.1*   < > 5.9* 4.0*  --   --  2.0* 1.7  --    < > = values in this interval not displayed.    Liver Function Tests: Recent Labs  Lab 12/09/2019 1124 12/23/19 0039 12/25/19 0958 12/26/19 0331  AST 125* 84*  83* 55* 73*  ALT 78* 45*  45* 38 46*  ALKPHOS 189* 101  101 189* 292*  BILITOT 2.5* 1.9*  1.8* 1.5* 1.3*  PROT 7.3 4.4*  4.4* 4.4* 4.3*  ALBUMIN 3.4* 2.0*  2.1* 1.9* 1.7*   Recent Labs  Lab 12/08/2019 1124 12/23/19 0039 12/24/19 1031  LIPASE 2,648* 2,176* 959*   No results for input(s): AMMONIA in the last 168 hours.  ABG    Component Value Date/Time   PHART 7.599 (H) 12/25/2019 0732   PCO2ART 33.7 12/25/2019 0732   PO2ART 133.0 (H) 12/25/2019 0732   HCO3 33.1 (H) 12/25/2019 0732   TCO2 34 (H) 12/25/2019 0732   ACIDBASEDEF 13.0 (H) 12/23/2019 0029   O2SAT 99.0 12/25/2019 0732     Coagulation Profile: Recent  Labs  Lab 12/06/2019 1124 12/23/19 0245  INR 1.1 1.6*    Cardiac Enzymes: No results for input(s): CKTOTAL, CKMB, CKMBINDEX, TROPONINI in the last 168 hours.  HbA1C: Hemoglobin A1C  Date/Time Value Ref Range Status  05/02/2011 11:52 AM 5.2  Final    CBG: Recent Labs  Lab 12/26/19 1536 12/26/19 1945 12/26/19 2340 12/27/19 0345 12/27/19 0824  GLUCAP 101* 97 115* 108* 101*    Review of Systems:   Unable to assess given current mental status.  Past Medical History  He,  has a past medical history of Abnormal LFTs, Cancer ([place]), Chronic back pain, Chronic knee pain, Cirrhosis ([place]), Fatty liver, Gout, Hiatal hernia, Hypertension, Pancreatic lesion, Pancreatitis, and Prostate cancer ([place]).   Surgical History    Past Surgical History:  Procedure Laterality Date  . ESOPHAGOGASTRODUODENOSCOPY N/A 03/15/2016   Procedure: ESOPHAGOGASTRODUODENOSCOPY (EGD);  Surgeon: Doran Stabler, MD;  Location: [facility];  Service: Endoscopy;  Laterality: N/A;  . KNEE SURGERY     left  . PANENDOSCOPY N/A 03/18/2016   Procedure: DIRECT LARYNGOSCOPY, WITH BRONCHOSCOPY, AND ESOPHAGOSCOPY, WITH BIOPSY;  Surgeon: [name], MD;  Location: [facility];  Service: ENT;  Laterality: N/A;  . PROSTATE BIOPSY       Social History   reports that he quit smoking about 3 years ago. His smoking use included cigarettes. He has a 9.00 pack-year smoking history. He has never used smokeless tobacco. He reports current alcohol use of about 120.0 standard drinks of alcohol per week. He reports that he does  not use drugs.   Family History   His family history includes Diabetes in his maternal grandmother and mother; Kidney disease in his sister; Throat cancer (age of onset: 64) in his brother.   Allergies Allergies  Allergen Reactions  . Penicillins Hives and Rash    Has patient had a PCN reaction causing immediate rash, facial/tongue/throat swelling, SOB or lightheadedness with hypotension:Yes Has patient  had a PCN reaction causing severe rash involving mucus membranes or skin necrosis:unsure Has patient had a PCN reaction that required hospitalization:No Has patient had a PCN reaction occurring within the last 10 years:No  If all of the above answers are "NO", then may proceed with Cephalosporin use. Has patient had a PCN reaction causing immediate rash, facial/tongue/throat swelling, SOB or lightheadedness with hypotension:Yes Has patient had a PCN reaction causing severe rash involving mucus membranes or skin necrosis:unsure Has patient had a PCN reaction that required hospitalization:No Has patient had a PCN reaction occurring within the last 10 years:No  If all of the above answers are "NO", then may proceed with Cephalosporin use.      Home Medications  Prior to Admission medications   Medication Sig Start Date End Date Taking? Authorizing Provider  albuterol (PROVENTIL) (2.5 MG/3ML) 0.083% nebulizer solution Take 3 mLs (2.5 mg total) by nebulization every 6 (six) hours as needed for wheezing or shortness of breath. 03/19/16   Florencia Reasons, MD  allopurinol (ZYLOPRIM) 100 MG tablet Take 1 tablet (100 mg total) by mouth daily. 09/20/16   [name], FNP  amLODipine (NORVASC) 10 MG tablet TAKE 1 TABLET(10 MG) BY MOUTH DAILY Patient not taking: Reported on 12/25/2019 03/13/17   [name], FNP  amLODipine (NORVASC) 10 MG tablet TAKE 1 TABLET(10 MG) BY MOUTH DAILY Patient taking differently: Take 10 mg by mouth daily.  04/18/17   [name], FNP  aspirin 81 MG chewable tablet Chew 81 mg by mouth daily.    [provider]  cyclobenzaprine (FLEXERIL) 5 MG tablet TAKE 1 TABLET(5 MG) BY MOUTH THREE TIMES DAILY AS NEEDED FOR MUSCLE SPASMS Patient taking differently: Take 5 mg by mouth 3 (three) times daily as needed for muscle spasms.  05/29/17   [name], FNP  feeding supplement, ENSURE ENLIVE, (ENSURE ENLIVE) LIQD Take 237 mLs by mouth 3 (three) times daily between  meals. 03/19/16   Florencia Reasons, MD  ferrous sulfate 325 (65 FE) MG tablet Take 325 mg by mouth daily with breakfast.    [provider]  Multiple Vitamin (MULTIVITAMIN) tablet Take 1 tablet by mouth daily.    [provider]  NEOMYCIN-POLYMYXIN-HYDROCORTISONE (CORTISPORIN) 1 % SOLN OTIC solution Place 3 drops into the left ear 4 (four) times daily. 04/28/17   Biagio Borg, MD  potassium chloride (K-DUR) 10 MEQ tablet Take 1 tablet (10 mEq total) by mouth daily. 10/27/16   [name], FNP  propranolol (INDERAL) 10 MG tablet TAKE 1 TABLET(10 MG) BY MOUTH TWICE DAILY Patient taking differently: Take 10 mg by mouth 2 (two) times daily.  10/27/16   [name], FNP  sucralfate (CARAFATE) 1 g tablet Take 1 g by mouth 2 (two) times daily. 09/26/19   [provider]  traZODone (DESYREL) 50 MG tablet Take 0.5-1 tablets (25-50 mg total) by mouth at bedtime as needed for sleep. 10/27/16   [name], FNP     Critical care time:   This note was initiated by Carolan Shiver. Meda Coffee, MS4

## 2019-12-27 NOTE — Progress Notes (Signed)
Room mate at bs, have established a password. Just contacted son and shared password, updated and offered virtual visit for this him as [place] is [place]. Emotional support provided.

## 2019-12-28 DIAGNOSIS — N179 Acute kidney failure, unspecified: Secondary | ICD-10-CM | POA: Diagnosis not present

## 2019-12-28 DIAGNOSIS — R579 Shock, unspecified: Secondary | ICD-10-CM | POA: Diagnosis not present

## 2019-12-28 DIAGNOSIS — K8522 Alcohol induced acute pancreatitis with infected necrosis: Secondary | ICD-10-CM | POA: Diagnosis not present

## 2019-12-28 DIAGNOSIS — J9601 Acute respiratory failure with hypoxia: Secondary | ICD-10-CM | POA: Diagnosis not present

## 2019-12-28 LAB — GLUCOSE, CAPILLARY
Glucose-Capillary: 108 mg/dL — ABNORMAL HIGH (ref 70–99)
Glucose-Capillary: 120 mg/dL — ABNORMAL HIGH (ref 70–99)
Glucose-Capillary: 122 mg/dL — ABNORMAL HIGH (ref 70–99)
Glucose-Capillary: 125 mg/dL — ABNORMAL HIGH (ref 70–99)
Glucose-Capillary: 126 mg/dL — ABNORMAL HIGH (ref 70–99)
Glucose-Capillary: 98 mg/dL (ref 70–99)

## 2019-12-28 LAB — BASIC METABOLIC PANEL
Anion gap: 8 (ref 5–15)
BUN: 31 mg/dL — ABNORMAL HIGH (ref 6–20)
CO2: 27 mmol/L (ref 22–32)
Calcium: 7.7 mg/dL — ABNORMAL LOW (ref 8.9–10.3)
Chloride: 102 mmol/L (ref 98–111)
Creatinine, Ser: 1 mg/dL (ref 0.61–1.24)
GFR calc Af Amer: >60 mL/min (ref >60)
GFR calc non Af Amer: >60 mL/min (ref >60)
Glucose, Bld: 131 mg/dL — ABNORMAL HIGH (ref 70–99)
Potassium: 3.6 mmol/L (ref 3.5–5.1)
Sodium: 137 mmol/L (ref 135–145)

## 2019-12-28 LAB — MAGNESIUM: Magnesium: 1.6 mg/dL — ABNORMAL LOW (ref 1.7–2.4)

## 2019-12-28 LAB — PHOSPHORUS: Phosphorus: 2.2 mg/dL — ABNORMAL LOW (ref 2.5–4.6)

## 2019-12-28 MED ORDER — PANTOPRAZOLE SODIUM 40 MG PO PACK: 40.0000 mg | PACK | Freq: Every day | ORAL | Status: DC

## 2019-12-28 MED ORDER — VITAL AF 1.2 CAL PO LIQD
1000.0000 mL | ORAL | Status: DC
  Administered 2019-12-28 – 2019-12-29 (×2): 1000 mL
  Filled 2019-12-28 (×2): qty 1000

## 2019-12-28 MED ORDER — POTASSIUM & SODIUM PHOSPHATES 280-160-250 MG PO PACK
1.0000 | PACK | Freq: Three times a day (TID) | ORAL | Status: DC
  Filled 2019-12-28 (×3): qty 1

## 2019-12-28 MED ORDER — ADULT MULTIVITAMIN LIQUID CH
15.0000 mL | Freq: Every day | ORAL | Status: DC
  Administered 2019-12-29 – 2019-12-31 (×3): 15 mL
  Filled 2019-12-28 (×3): qty 15

## 2019-12-28 MED ORDER — POTASSIUM & SODIUM PHOSPHATES 280-160-250 MG PO PACK
1.0000 | PACK | Freq: Three times a day (TID) | ORAL | Status: DC
  Administered 2019-12-28 – 2019-12-30 (×7): 1 via NASOGASTRIC
  Filled 2019-12-28 (×8): qty 1

## 2019-12-28 MED ORDER — MAGNESIUM SULFATE 4 GM/100ML IV SOLN
4.0000 g | Freq: Once | INTRAVENOUS | Status: AC
  Administered 2019-12-28: 4 g via INTRAVENOUS
  Filled 2019-12-28: qty 100

## 2019-12-28 MED ORDER — ACETAMINOPHEN 325 MG PO TABS: 650.0000 mg | ORAL_TABLET | ORAL | Status: DC | PRN

## 2019-12-28 MED ORDER — THIAMINE HCL 100 MG PO TABS
100.0000 mg | ORAL_TABLET | Freq: Every day | ORAL | Status: DC
  Administered 2019-12-30 – 2020-01-02 (×4): 100 mg via NASOGASTRIC
  Filled 2019-12-28 (×4): qty 1

## 2019-12-28 MED ORDER — POTASSIUM & SODIUM PHOSPHATES 280-160-250 MG PO PACK
1.0000 | PACK | Freq: Three times a day (TID) | ORAL | Status: DC
  Filled 2019-12-28: qty 1

## 2019-12-28 MED ORDER — POLYETHYLENE GLYCOL 3350 17 G PO PACK
17.0000 g | PACK | Freq: Every day | ORAL | Status: DC | PRN
  Filled 2019-12-28: qty 1

## 2019-12-28 MED ORDER — ADULT MULTIVITAMIN LIQUID CH
15.0000 mL | Freq: Every day | ORAL | Status: DC
  Filled 2019-12-28: qty 15

## 2019-12-28 MED ORDER — PANTOPRAZOLE SODIUM 40 MG PO TBEC
40.0000 mg | DELAYED_RELEASE_TABLET | Freq: Every day | ORAL | Status: DC
  Filled 2019-12-28: qty 1

## 2019-12-28 MED ORDER — CALCIUM GLUCONATE-NACL 1-0.675 GM/50ML-% IV SOLN
1.0000 g | Freq: Once | INTRAVENOUS | Status: AC
  Administered 2019-12-28: 11:00:00 1000 mg via INTRAVENOUS
  Filled 2019-12-28: qty 50

## 2019-12-28 MED ORDER — POLYETHYLENE GLYCOL 3350 17 G PO PACK: 17.0000 g | PACK | Freq: Every day | ORAL | Status: DC | PRN

## 2019-12-28 MED ORDER — OXYCODONE HCL 5 MG PO TABS
5.0000 mg | ORAL_TABLET | Freq: Four times a day (QID) | ORAL | Status: DC | PRN
  Administered 2019-12-31 – 2020-01-01 (×2): 5 mg via NASOGASTRIC
  Filled 2019-12-28 (×3): qty 1

## 2019-12-28 MED ORDER — THIAMINE HCL 100 MG PO TABS: 100.0000 mg | ORAL_TABLET | Freq: Every day | ORAL | Status: DC

## 2019-12-28 NOTE — Progress Notes (Signed)
BP 125/78 (BP Location: Left Arm)   Pulse (!) 123   Temp 98.4 F (36.9 C) (Oral)   Resp (!) 22   Ht 5\' 6"  (1.676 m)   Wt 59.1 kg   SpO2 95%   BMI 21.03 kg/m   MD aware  Will continue to monitor

## 2019-12-28 NOTE — Plan of Care (Signed)

## 2019-12-28 NOTE — Evaluation (Signed)
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 Admitted to ICU for septic shock secondary to possible GI etiology in the setting of recent nausea and vomiting. Patient unable to provide history. Family unable to provide history. Chart reviewed.   Clinical Impression   Pt PTA: history unknown and pt unable to provide info. Pt currently limited by decreased activity tolerance, decreased strength, decreased ability to follow commands and decreased mobility. Pt unable to tolerate sitting up at EOB x2 attempts with assist to lean forward. Pt continued to have posterior lean and rigid posture ~10 secs x2 times. Pt maxA to totalA for ADL and totalA for bed mobility today. Pt appears to be motivated. Pt would benefit from post acute care setting.  5L FiO2  96% throughout exertion. 112B-119BPM. OT following acutely.      Follow Up Recommendations  CIR;Supervision/Assistance - 24 hour    Equipment Recommendations  Other (comment)(defer to next facility)    Recommendations for Other Services       Precautions / Restrictions Precautions Precautions: Fall;Other (comment) Precaution Comments: trach collar Restrictions Weight Bearing Restrictions: No      Mobility Bed Mobility Overal bed mobility: Needs Assistance Bed Mobility: Supine to Sit     Supine to sit: Total assist     General bed mobility comments: Pt not following commands to move BLEs or BUEs to come to sitting,  Transfers                 General transfer comment: deferred as pt too resistive sitting at EOB and unsafe for +1 assist    Balance  Overall balance assessment: Needs assistance Sitting-balance support: No upper extremity supported Sitting balance-Leahy Scale: Zero Sitting balance - Comments: Pt unable to tolerate sitting up at EOB x2 attempts with assist to lean forward. Pt continued to have posterior lean and rigid posture ~10 secs x2 times.                                   ADL either performed or assessed with clinical judgement   ADL Overall ADL's : Needs assistance/impaired Eating/Feeding: NPO Eating/Feeding Details (indicate cue type and reason): cortrak Grooming: Maximal assistance Grooming Details (indicate cue type and reason): Pt given washcloth, but pt did not initiate to wash face Upper Body Bathing: Maximal assistance;Sitting;Bed level   Lower Body Bathing: Total assistance;+2 for physical assistance;+2 for safety/equipment;Sitting/lateral leans;Sit to/from stand   Upper Body Dressing : Maximal assistance;Sitting   Lower Body Dressing: Total assistance;+2 for physical assistance;+2 for safety/equipment;Sitting/lateral leans;Sit to/from stand   Toilet Transfer: Total assistance;+2 for physical assistance;+2 for safety/equipment   Toileting- Clothing Manipulation and Hygiene: Total assistance;Sitting/lateral lean;Sit to/from stand       Functional mobility during ADLs: Total assistance;Cueing for sequencing General ADL Comments: Pt limited by decreased activity tolerance, decreased strength, decreased ability to follow commands and decreased mobility.     Vision Baseline Vision/History: No visual deficits Patient Visual Report: No change from baseline Vision Assessment?: Vision impaired- to be further tested in functional context Additional Comments: Continue to assess. Pt unable to follow commands for vision  screen     Perception     Praxis      Pertinent Vitals/Pain Pain Assessment: Faces Faces Pain Scale: Hurts little more Pain Location: with movement to EOB, LLE Pain  Descriptors / Indicators: Grimacing;Guarding Pain Intervention(s): Monitored during session     Hand Dominance     Extremity/Trunk Assessment Upper Extremity Assessment Upper Extremity Assessment: Generalized weakness;RUE deficits/detail;LUE deficits/detail RUE Deficits / Details: decreased strength 2/5, mitts present LUE Deficits / Details: decreased strength 2/5, mitts present   Lower Extremity Assessment Lower Extremity Assessment: Defer to PT evaluation   Cervical / Trunk Assessment Cervical / Trunk Assessment: Other exceptions Cervical / Trunk Exceptions: rigidity in sitting EOB   Communication Communication Communication: No difficulties   Cognition Arousal/Alertness: Awake/alert Behavior During Therapy: WFL for tasks assessed/performed;Flat affect Overall Cognitive Status: No family/caregiver present to determine baseline cognitive functioning                                 General Comments: Pt nodding "yes" when asked to sit at EOB, but once EOB, pt resisting.   General Comments  FiO2 96% on 5L throughout exertion. 112B-119BPM.    Exercises Exercises: Other exercises Other Exercises Other Exercises: warm up UE ROM Other Exercises: warm up LE ROM exercise.   Shoulder Instructions      Home Living Family/patient expects to be discharged to:: Unsure                                 Additional Comments: Per chart, pt had a rooomate; pt unable to provide details.      Prior Functioning/Environment          Comments: pt unable to give reliable information on PLOF.        OT Problem List: Decreased strength;Decreased range of motion;Decreased activity tolerance;Impaired balance (sitting and/or standing);Decreased coordination;Decreased cognition;Decreased safety awareness;Cardiopulmonary status limiting activity;Impaired UE functional use;Pain;Increased edema      OT Treatment/Interventions: Self-care/ADL training;Therapeutic  exercise;Neuromuscular education;DME and/or AE instruction;Energy conservation;Therapeutic activities;Cognitive remediation/compensation;Patient/family education;Balance training;Visual/perceptual remediation/compensation    OT Goals(Current goals can be found in the care plan section) Acute Rehab OT Goals Patient Stated Goal: pt unable OT Goal Formulation: Patient unable to participate in goal setting Time For Goal Achievement: 01/11/20 Potential to Achieve Goals: Good ADL Goals Pt Will Perform Grooming: with min assist;sitting Pt Will Transfer to Toilet: with max assist;stand pivot transfer;bedside commode Pt/caregiver will Perform Home Exercise Program: Increased strength;Both right and left upper extremity;With Supervision Additional ADL Goal #1: Pt will follow  (3) 1 step commands consistently with no verbal cues to attend to task. Additional ADL Goal #2: Pt will perform bed mobility with minA overall as a precursor to OOB tasks. Additional ADL Goal #3: Pt will maintain O2 sats >90% during x5 min ADL task.  OT Frequency: Min 2X/week   Barriers to D/C:            Co-evaluation              AM-PAC OT "6 Clicks" Daily Activity     Outcome Measure Help from another person eating meals?: Total Help from another person taking care of personal grooming?: A Lot Help from another person toileting, which includes using toliet, bedpan, or urinal?: Total Help from another person bathing (including washing, rinsing, drying)?: Total Help from another person to put on and taking off  regular upper body clothing?: Total Help from another person to put on and taking off regular lower body clothing?: Total 6 Click Score: 7   End of Session Equipment Utilized During Treatment: Oxygen Nurse Communication: Mobility status;Other (comment)(need to clean flexiseal area)  Activity Tolerance: Patient limited by pain Patient left: in bed;with call bell/phone within reach;with bed alarm set  OT  Visit Diagnosis: Unsteadiness on feet (R26.81);Muscle weakness (generalized) (M62.81);Pain;Cognitive communication deficit (R41.841) Pain - part of body: ("all over")                Time: [id number] OT Time Calculation (min): 20 min Charges:  OT General Charges $OT Visit: 1 Visit OT Evaluation $OT Eval Moderate Complexity: 1 Mod  Jefferey Pica, OTR/L Acute Rehabilitation Services Pager: [phone] Office: [phone]   Pansie Guggisberg C 12/28/2019, 3:59 PM

## 2019-12-28 NOTE — Progress Notes (Signed)
PROGRESS NOTE    Adam Marshall  [id number] DOB: 11/19/1960 DOA: 12/15/2019 PCP: [name], FNP    Brief Narrative:  Patient was admitted to the hospital with a working diagnosis of septic shock due to acute pancreatitis, complicated by hypothermia, respiratory failure, metabolic encephalopathy and acute kidney injury.  60 year old male who presented with altered mental status.  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 He also has prostate cancer, and alcoholic cirrhosis.  Patient had several days of altered mentation, EMS was called and patient was found hypotensive, on arrival to the ED his temperature was 83.3, blood pressure 89/50, heart rate 49.  He was cachectic and ill looking appearance, positive systolic murmur, his lungs had decreased breath sounds bilaterally, his abdomen was tender in the epigastric region and left upper quadrant, no lower extremity edema.  Patient was unable to follow commands but no signs of focality.  Patient was placed on broad-spectrum antibiotic therapy and vasopressors.  On April 20 he developed respiratory distress, a Shiley 8 tracheostomy tube was inserted and patient was placed on mechanical ventilation.  The following day on April 21 she was transitioned back to trach collar. Patient had an NG tube placed for feedings.   Patient required vasopressors while in the intensive care unit.   Transferred to [facility] on 12/27/19.   Patient with refeeding syndrome, aggressive correction of P, Mg, Ca and K. Continue to have swallow dysfunction, NG tube in place.   Assessment & Plan:   Active Problems:   Respiratory failure ([place])   Shock ([place])   Acute kidney injury ([place])   Acute pancreatitis    1. Septic shock (end-organ failure hypotension), due to acute alcoholic pancreatitis (present on admssion).  CT abdomen  with no pancreatic abscess (04/21). Wbc has been stable at 5,4. Tolerating tube feedings well.   Patient completed meropenem, course. Continue nutritional supplementation an pain control. Will continue telemetry monitoring for now.   2. Acute hypoxemic respiratory failure. Continue tolerating trach collar, will keep cuffed trach for the next 48 H before changing to un-cuff trach.   Per speech evaluation patient not able to aspirate unless a tracheo esophageal fistula. Will remove NG tube and will advance his diet, continue aspiration precautions and close oxymetry monitoring.  3. Liver cirrhosis due to alcohol/ multifactorial anemia and Pancytopenia. sp 2 units PRBC transfusion since admission. Hgb 8.5 yesterday, with platelets down to 27. Will check cell count in am with iron panel.   4. AKI, Hypoglycemia, Hypokalemia, hypophosphatemia, hypocalcemia and hypomagnesemia/ severe calorie protein malnutrition with refeeding syndrome. Renal function with serum cr at 1,00 with K at 3,6 with serum bicarbonate at 27 and cl at 102. CA 7.7 and Phos 2.2 up from less than 1.0  Will continue aggressive electrolyte correction with ca gluconate (1 amp), K phos (1 packed tid) / Kcl 40 kcl, and mag sulfate 4 g.   Will follow renal panel in am.   5. Penile lesion and Hx of prostate cancer. Local wound care. Will keep foley catheter for now.   6. Metabolic encephalopathy. On thiamine and multivitamins. Will remove NG tube and allow patient to eat, will attempt to remove mittens today. Continue neuro checks per unit protocol.   Late entry: patient refused to eat, will continue tube feedings for now and will continue to offer po intake. Continue meds per NG tube  Right subclavian central  line. 04/20  DVT prophylaxis:      scd  Code Status:              full  Family Communication:       No family at the bedside   Disposition Plan:              Patient is from:                        Home               Anticipated DC to:                   snf              Anticipated DC date:               To be determined             Anticipated DC barriers:         Patient critically ill needing close monitoring of electrolytes and hemodynamics.       Nutrition Status: Nutrition Problem: Severe Malnutrition Etiology: chronic illness(SCC of pyrifoam sinus s/p laryngectomy and neck dissection) Signs/Symptoms: severe fat depletion, severe muscle depletion Interventions: Tube feeding    Consultants:   Urology   Procedures:   Right subclavian central line 04/20  Antimicrobials:   Meropenem      Subjective: Patient very weak and deconditioned, limited communication, due to tracheostomy. No apparent pain or dyspnea.   Objective: Vitals:   12/27/19 2300 12/28/19 0400 12/28/19 0803 12/28/19 0807  BP:  112/75 103/76   Pulse: 87 99  (!) 105  Resp: 15 18  17   Temp:   98.2 F (36.8 C)   TempSrc:   Axillary   SpO2: 100% 100%  100%  Weight:  59.1 kg    Height:        Intake/Output Summary (Last 24 hours) at 12/28/2019 0847 Last data filed at 12/28/2019 0400 Gross per 24 hour  Intake 1395.26 ml  Output 1395 ml  Net 0.26 ml   Filed Weights   12/27/19 0630 12/27/19 2200 12/28/19 0400  Weight: 58.1 kg 58.8 kg 59.1 kg    Examination:   General: deconditioned and ill looking appearing  Neurology: Awake, not much interactive today E ENT: no pallor, no icterus, oral mucosa moist, trach in place.  Cardiovascular: No JVD. S1-S2 present, rhythmic, no gallops, rubs, or murmurs. Trace lower extremity edema. Pulmonary: positive breath sounds bilaterally, adequate air movement, no wheezing, rhonchi or rales. Gastrointestinal. Abdomen mild distended no organomegaly, non tender, no rebound or guarding Skin. No rashes Musculoskeletal: no joint deformities     Data Reviewed: I have personally reviewed following labs and imaging studies  CBC: Recent Labs  Lab 12/26/2019 1124  12/12/2019 1142 12/24/19 0006 12/24/19 1657 12/25/19 0634 12/25/19 0732 12/26/19 0331 12/26/19 1820 12/27/19 1034  WBC 5.1   < > 5.0  --  5.6  --  4.9 6.1 5.4  NEUTROABS 4.7  --  4.6  --  5.1  --   --   --  4.7  HGB 10.9*   < > 8.0*   < > 7.6* 8.5* 6.9* 8.4* 8.5*  HCT 31.6*   < > 22.3*   < > 21.6* 25.0* 20.1* 24.5* 24.3*  MCV 97.5   < > 89.2  --  92.3  --  93.5 90.1 89.3  PLT 91*   < >  24*  --  13*  --  9* 34* 27*   < > = values in this interval not displayed.   Basic Metabolic Panel: Recent Labs  Lab 12/23/19 0039 12/23/19 0039 12/24/19 0006 12/24/19 0006 12/24/19 1031 12/24/19 1657 12/25/19 0732 12/25/19 0958 12/26/19 0331 12/27/19 1034 12/28/19 0448  NA 135   < > 137   < > 137   < > 134* 135 137 137 137  K 3.4*   < > 2.4*   < > 2.8*   < > 2.7* 2.7* 3.3* 3.1* 3.6  CL 103   < > 100   < > 96*  --   --  94* 99 103 102  CO2 10*   < > 21*   < > 27  --   --  29 29 26 27   GLUCOSE 68*   < > 113*   < > 146*  --   --  137* 120* 143* 131*  BUN 75*   < > 49*   < > 39*  --   --  29* 26* 26* 31*  CREATININE 2.62*   < > 2.03*   < > 1.82*  --   --  1.53* 1.41* 0.95 1.00  CALCIUM 7.7*   < > 6.9*   < > 6.4*  --   --  5.8* 6.4* 7.2* 7.7*  MG 1.5*  --  1.6*  --  1.9  --   --   --   --  1.1* 1.6*  PHOS 1.0*  --  1.1*  --  2.9  --   --   --   --  <1.0* 2.2*   < > = values in this interval not displayed.   GFR: Estimated Creatinine Clearance: 66.5 mL/min (by C-G formula based on SCr of 1 mg/dL). Liver Function Tests: Recent Labs  Lab 12/28/2019 1124 12/23/19 0039 12/25/19 0958 12/26/19 0331  AST 125* 84*  83* 55* 73*  ALT 78* 45*  45* 38 46*  ALKPHOS 189* 101  101 189* 292*  BILITOT 2.5* 1.9*  1.8* 1.5* 1.3*  PROT 7.3 4.4*  4.4* 4.4* 4.3*  ALBUMIN 3.4* 2.0*  2.1* 1.9* 1.7*   Recent Labs  Lab 12/18/2019 1124 12/23/19 0039 12/24/19 1031  LIPASE 2,648* 2,176* 959*   No results for input(s): AMMONIA in the last 168 hours. Coagulation Profile: Recent Labs  Lab  12/26/2019 1124 12/23/19 0245  INR 1.1 1.6*   Cardiac Enzymes: No results for input(s): CKTOTAL, CKMB, CKMBINDEX, TROPONINI in the last 168 hours. BNP (last 3 results) No results for input(s): PROBNP in the last 8760 hours. HbA1C: No results for input(s): HGBA1C in the last 72 hours. CBG: Recent Labs  Lab 12/27/19 1632 12/27/19 2008 12/28/19 0029 12/28/19 0409 12/28/19 0758  GLUCAP 110* 123* 126* 120* 98   Lipid Profile: No results for input(s): CHOL, HDL, LDLCALC, TRIG, CHOLHDL, LDLDIRECT in the last 72 hours. Thyroid Function Tests: No results for input(s): TSH, T4TOTAL, FREET4, T3FREE, THYROIDAB in the last 72 hours. Anemia Panel: Recent Labs    12/26/19 0331  RETICCTPCT <0.4*      Radiology Studies: I have reviewed all of the imaging during this hospital visit personally     Scheduled Meds: . chlorhexidine  15 mL Mouth Rinse BID  . Chlorhexidine Gluconate Cloth  6 each Topical Daily  . mouth rinse  15 mL Mouth Rinse q12n4p  . multivitamin  15 mL Per Tube Daily  . pantoprazole sodium  40  mg Per Tube Daily  . potassium & sodium phosphates  1 packet Per NG tube TID WC & HS  . sodium chloride flush  10-40 mL Intracatheter Q12H   Continuous Infusions: . sodium chloride 10 mL/hr at 12/25/19 1600  . sodium chloride Stopped (12/27/19 1244)  . calcium gluconate    . feeding supplement (VITAL AF 1.2 CAL) 1,000 mL (12/28/19 0355)  . magnesium sulfate bolus IVPB    . thiamine injection 250 mg (12/27/19 0959)     LOS: 6 days        Adam Leadbetter Gerome Apley, MD

## 2019-12-28 NOTE — Progress Notes (Signed)
Rehab Admissions Coordinator Note:  Patient was screened by Cleatrice Burke for appropriateness for an Inpatient Acute Rehab Consult per therpay recs.   At this time, we are recommending Inpatient Rehab consult. I iwll place order per protocol.  Cleatrice Burke RN MSN 12/28/2019, 5:01 PM  I can be reached at [phone].

## 2019-12-28 NOTE — Progress Notes (Signed)
Pt had MEW score of 2 due to tachycardia between 106-115. Pt's heart rate has been tachycardic since 3:00 am. Notified Dr. Cathlean Sauer and charge nurse. No order received.

## 2019-12-29 DIAGNOSIS — K860 Alcohol-induced chronic pancreatitis: Secondary | ICD-10-CM

## 2019-12-29 DIAGNOSIS — C139 Malignant neoplasm of hypopharynx, unspecified: Secondary | ICD-10-CM

## 2019-12-29 DIAGNOSIS — N179 Acute kidney failure, unspecified: Secondary | ICD-10-CM | POA: Diagnosis not present

## 2019-12-29 DIAGNOSIS — K7030 Alcoholic cirrhosis of liver without ascites: Secondary | ICD-10-CM | POA: Diagnosis present

## 2019-12-29 DIAGNOSIS — K8522 Alcohol induced acute pancreatitis with infected necrosis: Secondary | ICD-10-CM | POA: Diagnosis not present

## 2019-12-29 DIAGNOSIS — K7469 Other cirrhosis of liver: Secondary | ICD-10-CM | POA: Insufficient documentation

## 2019-12-29 DIAGNOSIS — E878 Other disorders of electrolyte and fluid balance, not elsewhere classified: Secondary | ICD-10-CM

## 2019-12-29 DIAGNOSIS — G9341 Metabolic encephalopathy: Secondary | ICD-10-CM | POA: Diagnosis not present

## 2019-12-29 LAB — CBC
HCT: 20.5 % — ABNORMAL LOW (ref 39.0–52.0)
Hemoglobin: 7.2 g/dL — ABNORMAL LOW (ref 13.0–17.0)
MCH: 31.6 pg (ref 26.0–34.0)
MCHC: 35.1 g/dL (ref 30.0–36.0)
MCV: 89.9 fL (ref 80.0–100.0)
Platelets: 32 10*3/uL — ABNORMAL LOW (ref 150–400)
RBC: 2.28 MIL/uL — ABNORMAL LOW (ref 4.22–5.81)
RDW: 15.5 % (ref 11.5–15.5)
WBC: 6.7 10*3/uL (ref 4.0–10.5)
nRBC: 0.3 % — ABNORMAL HIGH (ref 0.0–0.2)

## 2019-12-29 LAB — BASIC METABOLIC PANEL
Anion gap: 10 (ref 5–15)
BUN: 27 mg/dL — ABNORMAL HIGH (ref 6–20)
CO2: 24 mmol/L (ref 22–32)
Calcium: 7.8 mg/dL — ABNORMAL LOW (ref 8.9–10.3)
Chloride: 104 mmol/L (ref 98–111)
Creatinine, Ser: 1.23 mg/dL (ref 0.61–1.24)
GFR calc Af Amer: >60 mL/min (ref >60)
GFR calc non Af Amer: >60 mL/min (ref >60)
Glucose, Bld: 112 mg/dL — ABNORMAL HIGH (ref 70–99)
Potassium: 3.4 mmol/L — ABNORMAL LOW (ref 3.5–5.1)
Sodium: 138 mmol/L (ref 135–145)

## 2019-12-29 LAB — PHOSPHORUS: Phosphorus: 3.4 mg/dL (ref 2.5–4.6)

## 2019-12-29 LAB — GLUCOSE, CAPILLARY
Glucose-Capillary: 103 mg/dL — ABNORMAL HIGH (ref 70–99)
Glucose-Capillary: 105 mg/dL — ABNORMAL HIGH (ref 70–99)
Glucose-Capillary: 109 mg/dL — ABNORMAL HIGH (ref 70–99)
Glucose-Capillary: 121 mg/dL — ABNORMAL HIGH (ref 70–99)
Glucose-Capillary: 79 mg/dL (ref 70–99)

## 2019-12-29 LAB — MAGNESIUM: Magnesium: 1.9 mg/dL (ref 1.7–2.4)

## 2019-12-29 MED ORDER — VITAL AF 1.2 CAL PO LIQD
1000.0000 mL | ORAL | Status: DC
  Administered 2019-12-29 – 2019-12-31 (×2): 1000 mL
  Filled 2019-12-29 (×3): qty 1000

## 2019-12-29 MED ORDER — POTASSIUM CHLORIDE 20 MEQ PO PACK
40.0000 meq | PACK | Freq: Once | ORAL | Status: AC
  Administered 2019-12-29: 40 meq
  Filled 2019-12-29: qty 2

## 2019-12-29 MED ORDER — MAGNESIUM OXIDE 400 (241.3 MG) MG PO TABS
400.0000 mg | ORAL_TABLET | Freq: Three times a day (TID) | ORAL | Status: DC
  Administered 2019-12-29 – 2020-01-02 (×14): 400 mg via NASOGASTRIC
  Filled 2019-12-29 (×14): qty 1

## 2019-12-29 MED ORDER — POTASSIUM CHLORIDE 20 MEQ PO PACK: 40.0000 meq | PACK | Freq: Once | ORAL | Status: DC

## 2019-12-29 NOTE — Progress Notes (Signed)
PROGRESS NOTE    Adam Marshall  [id number] DOB: 23-Sep-1960 DOA: 12/20/2019 PCP: [name], FNP    Brief Narrative:  Patient was admitted to the hospital with a working diagnosis ofsepticshock due to acute pancreatitis, complicated by hypothermia, respiratory failure, metabolic encephalopathyand acute kidney injury.  59 year old male who presented with altered mental status. 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 He also has prostate cancer,andalcoholic cirrhosis.Patient had several days of altered mentation, EMS was called and patient was found hypotensive,on arrival to the ED his temperature was 83.3, blood pressure 89/50, heart rate 49.He was cachectic and ill looking appearance, positive systolic murmur, his lungs had decreased breath sounds bilaterally, his abdomen was tender in the epigastric region and left upper quadrant, no lower extremity edema.Patient was unable to follow commands but no signs of focality.  Patient was placed on broad-spectrum antibiotic therapy and vasopressors. On April 20 he developed respiratory distress, a Shiley 8 tracheostomy tube was inserted and patient was placed on mechanical ventilation. The following day on April 21 she was transitioned back to trach collar. Patient had an NG tube placed for feedings.  Patient required vasopressors while in the intensive care unit.   Transferred to [facility] on 12/27/19.  Patient with refeeding syndrome, aggressive correction of P, Mg, Ca and K. Patient with decreased po intake, continue Ng tube for supportive feedings and for medications.    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 Septic shock (end-organ failure hypotension), due to acute alcoholic pancreatitis (present on admssion).  CT abdomen with no pancreatic abscess (04/21). Completed antibiotic therapy with meropenem. Wbc continue to be stable at 6,7.   Patient is allowed to eat, no risk of aspiration, no clinical signs of esophageal tracheal fistula. Will change diet to regular and will decrease rate to tube feedings.   2. Acute hypoxemic respiratory failure. On trach collar with good toleration. Currently he has a cuffed trach.  Continue oxymetry monitoring, physical therapy and occupational therapy evaluation, out of bed to chair tid with meals.   3. Liver cirrhosis due to alcohol/ multifactorial anemia. Thrombocytopenia. sp 2 units PRBC transfusion since admission. Slowly trending down Hgb, today down to 7,2, platelets 32..  Will check iron panel and will continue close monitoring of Hgb and Plt, hold on blood products today.   4. AKI, Hypoglycemia, Hypokalemia, hypophosphatemia, hypocalcemia and hypomagnesemia/ severe calorie protein malnutrition with refeeding syndrome. Renal function with serum cr at 1,23 with K at 3,4 with serum bicarbonate at 24 and cl at 104. CA 7.8 and Phos 3,4. Mg 1.9  Continue with electrolyte correction with K phos (1 packed tid) / and kcl 40 meq. Add magnesium oxide 400 mg tid. Follow electrolytes in am.    5. Penile lesion and Hx of prostate cancer. continue with local wound care. Continue with foley catheter.   6. Metabolic encephalopathy. Continue with  thiamine and multivitamins. For now will keep NG tube for medications, yesterday he refused po intake. Will change diet to regular and continue close  monitoring,     Right subclavian central line. 04/20  DVT  prophylaxis:scd Code Status:full Family Communication:No family at the bedside Disposition Plan: Patient is from:Home Anticipated DC to:snf Anticipated DC date:To be determined Anticipated DC barriers:Patient critically ill needing close monitoring of electrolytes and hemodynamics.    Nutrition Status: Nutrition Problem: Severe Malnutrition Etiology: chronic illness(SCC of pyrifoam sinus s/p laryngectomy and neck dissection) Signs/Symptoms: severe fat depletion, severe muscle depletion Interventions: Tube feeding   Consultants:  Urology  Procedures:  Right subclavian central line 04/20  Antimicrobials:  Meropenem     Subjective: Patient is more awake and alert, has been asking for regular diet,. No nausea or vomiting, no chest pain or dyspnea.   Objective: Vitals:   12/29/19 0021 12/29/19 0500 12/29/19 0747 12/29/19 0810  BP:    119/80  Pulse: (!) 121  (!) 115 (!) 112  Resp: (!) 24  20 20   Temp:    99.9 F (37.7 C)  TempSrc:    Oral  SpO2: 97%  100% 97%  Weight:  59 kg    Height:        Intake/Output Summary (Last 24 hours) at 12/29/2019 0944 Last data filed at 12/29/2019 0900 Gross per 24 hour  Intake 0 ml  Output 825 ml  Net -825 ml   Filed Weights   12/27/19 2200 12/28/19 0400 12/29/19 0500  Weight: 58.8 kg 59.1 kg 59 kg    Examination:   General: Not in pain or dyspnea. Deconditioned  Neurology: Awake and alert, non focal. Limited communication due to trach E ENT: positive pallor, no icterus, oral mucosa moist, trach in place Cardiovascular: No JVD. S1-S2 present, rhythmic, no gallops, rubs, or murmurs. No lower extremity edema. Pulmonary: positive breath sounds bilaterally, decreased air movement, no wheezing, scattered rhonchi but no rales. Gastrointestinal. Abdomen with, no  organomegaly, non tender, no rebound or guarding Skin. No rashes Musculoskeletal: no joint deformities     Data Reviewed: I have personally reviewed following labs and imaging studies  CBC: Recent Labs  Lab 12/05/2019 1124 12/09/2019 1142 12/24/19 0006 12/24/19 1657 12/25/19 0634 12/25/19 0634 12/25/19 0732 12/26/19 0331 12/26/19 1820 12/27/19 1034 12/29/19 0350  WBC 5.1   < > 5.0  --  5.6  --   --  4.9 6.1 5.4 6.7  NEUTROABS 4.7  --  4.6  --  5.1  --   --   --   --  4.7  --   HGB 10.9*   < > 8.0*   < > 7.6*   < > 8.5* 6.9* 8.4* 8.5* 7.2*  HCT 31.6*   < > 22.3*   < > 21.6*   < > 25.0* 20.1* 24.5* 24.3* 20.5*  MCV 97.5   < > 89.2  --  92.3  --   --  93.5 90.1 89.3 89.9  PLT 91*   < > 24*  --  13*  --   --  9* 34* 27* 32*   < > = values in this interval not displayed.   Basic Metabolic Panel: Recent Labs  Lab 12/24/19 0006 12/24/19 0006 12/24/19 1031 12/24/19 1657 12/25/19 0958 12/26/19 0331 12/27/19 1034 12/28/19 0448 12/29/19 0350  NA 137   < > 137   < > 135 137 137 137 138  K 2.4*   < > 2.8*   < > 2.7* 3.3* 3.1* 3.6 3.4*  CL 100   < > 96*  --  94* 99 103 102 104  CO2 21*   < >  27  --  29 29 26 27 24   GLUCOSE 113*   < > 146*  --  137* 120* 143* 131* 112*  BUN 49*   < > 39*  --  29* 26* 26* 31* 27*  CREATININE 2.03*   < > 1.82*  --  1.53* 1.41* 0.95 1.00 1.23  CALCIUM 6.9*   < > 6.4*  --  5.8* 6.4* 7.2* 7.7* 7.8*  MG 1.6*  --  1.9  --   --   --  1.1* 1.6* 1.9  PHOS 1.1*  --  2.9  --   --   --  <1.0* 2.2* 3.4   < > = values in this interval not displayed.   GFR: Estimated Creatinine Clearance: 54 mL/min (by C-G formula based on SCr of 1.23 mg/dL). Liver Function Tests: Recent Labs  Lab 12/30/2019 1124 12/23/19 0039 12/25/19 0958 12/26/19 0331  AST 125* 84*  83* 55* 73*  ALT 78* 45*  45* 38 46*  ALKPHOS 189* 101  101 189* 292*  BILITOT 2.5* 1.9*  1.8* 1.5* 1.3*  PROT 7.3 4.4*  4.4* 4.4* 4.3*  ALBUMIN 3.4* 2.0*  2.1* 1.9* 1.7*   Recent Labs  Lab  12/27/2019 1124 12/23/19 0039 12/24/19 1031  LIPASE 2,648* 2,176* 959*   No results for input(s): AMMONIA in the last 168 hours. Coagulation Profile: Recent Labs  Lab 12/23/2019 1124 12/23/19 0245  INR 1.1 1.6*   Cardiac Enzymes: No results for input(s): CKTOTAL, CKMB, CKMBINDEX, TROPONINI in the last 168 hours. BNP (last 3 results) No results for input(s): PROBNP in the last 8760 hours. HbA1C: No results for input(s): HGBA1C in the last 72 hours. CBG: Recent Labs  Lab 12/28/19 1845 12/28/19 2007 12/29/19 0019 12/29/19 0437 12/29/19 0806  GLUCAP 125* 122* 103* 105* 121*   Lipid Profile: No results for input(s): CHOL, HDL, LDLCALC, TRIG, CHOLHDL, LDLDIRECT in the last 72 hours. Thyroid Function Tests: No results for input(s): TSH, T4TOTAL, FREET4, T3FREE, THYROIDAB in the last 72 hours. Anemia Panel: No results for input(s): VITAMINB12, FOLATE, FERRITIN, TIBC, IRON, RETICCTPCT in the last 72 hours.    Radiology Studies: I have reviewed all of the imaging during this hospital visit personally     Scheduled Meds: . chlorhexidine  15 mL Mouth Rinse BID  . Chlorhexidine Gluconate Cloth  6 each Topical Daily  . mouth rinse  15 mL Mouth Rinse q12n4p  . multivitamin  15 mL Per Tube Daily  . potassium & sodium phosphates  1 packet Per NG tube TID WC & HS  . sodium chloride flush  10-40 mL Intracatheter Q12H  . [START ON 12/30/2019] thiamine  100 mg Per NG tube Daily   Continuous Infusions: . sodium chloride 10 mL/hr at 12/28/19 1114  . sodium chloride 250 mL (12/28/19 1044)  . feeding supplement (VITAL AF 1.2 CAL) 1,000 mL (12/28/19 1822)  . thiamine injection 250 mg (12/29/19 0921)     LOS: 7 days        Liviah Cake Gerome Apley, MD

## 2019-12-29 NOTE — Progress Notes (Signed)
Patient continues to have a yellow mews score. This is not a new change. MD aware.

## 2019-12-30 DIAGNOSIS — E878 Other disorders of electrolyte and fluid balance, not elsewhere classified: Secondary | ICD-10-CM

## 2019-12-30 DIAGNOSIS — E43 Unspecified severe protein-calorie malnutrition: Secondary | ICD-10-CM

## 2019-12-30 DIAGNOSIS — N179 Acute kidney failure, unspecified: Secondary | ICD-10-CM | POA: Diagnosis not present

## 2019-12-30 DIAGNOSIS — G9341 Metabolic encephalopathy: Secondary | ICD-10-CM | POA: Diagnosis not present

## 2019-12-30 DIAGNOSIS — K859 Acute pancreatitis without necrosis or infection, unspecified: Secondary | ICD-10-CM

## 2019-12-30 DIAGNOSIS — K7030 Alcoholic cirrhosis of liver without ascites: Secondary | ICD-10-CM | POA: Diagnosis not present

## 2019-12-30 DIAGNOSIS — K8522 Alcohol induced acute pancreatitis with infected necrosis: Secondary | ICD-10-CM | POA: Diagnosis not present

## 2019-12-30 LAB — CBC
HCT: 20.9 % — ABNORMAL LOW (ref 39.0–52.0)
Hemoglobin: 7.2 g/dL — ABNORMAL LOW (ref 13.0–17.0)
MCH: 31.6 pg (ref 26.0–34.0)
MCHC: 34.4 g/dL (ref 30.0–36.0)
MCV: 91.7 fL (ref 80.0–100.0)
Platelets: 51 10*3/uL — ABNORMAL LOW (ref 150–400)
RBC: 2.28 MIL/uL — ABNORMAL LOW (ref 4.22–5.81)
RDW: 16.3 % — ABNORMAL HIGH (ref 11.5–15.5)
WBC: 6 10*3/uL (ref 4.0–10.5)
nRBC: 0 % (ref 0.0–0.2)

## 2019-12-30 LAB — BASIC METABOLIC PANEL
Anion gap: 10 (ref 5–15)
BUN: 29 mg/dL — ABNORMAL HIGH (ref 6–20)
CO2: 25 mmol/L (ref 22–32)
Calcium: 8.5 mg/dL — ABNORMAL LOW (ref 8.9–10.3)
Chloride: 103 mmol/L (ref 98–111)
Creatinine, Ser: 0.91 mg/dL (ref 0.61–1.24)
GFR calc Af Amer: >60 mL/min (ref >60)
GFR calc non Af Amer: >60 mL/min (ref >60)
Glucose, Bld: 83 mg/dL (ref 70–99)
Potassium: 3.7 mmol/L (ref 3.5–5.1)
Sodium: 138 mmol/L (ref 135–145)

## 2019-12-30 LAB — GLUCOSE, CAPILLARY
Glucose-Capillary: 119 mg/dL — ABNORMAL HIGH (ref 70–99)
Glucose-Capillary: 63 mg/dL — ABNORMAL LOW (ref 70–99)
Glucose-Capillary: 63 mg/dL — ABNORMAL LOW (ref 70–99)
Glucose-Capillary: 69 mg/dL — ABNORMAL LOW (ref 70–99)
Glucose-Capillary: 72 mg/dL (ref 70–99)
Glucose-Capillary: 76 mg/dL (ref 70–99)
Glucose-Capillary: 78 mg/dL (ref 70–99)
Glucose-Capillary: 79 mg/dL (ref 70–99)
Glucose-Capillary: 83 mg/dL (ref 70–99)

## 2019-12-30 LAB — PHOSPHORUS: Phosphorus: 5 mg/dL — ABNORMAL HIGH (ref 2.5–4.6)

## 2019-12-30 LAB — TRANSFERRIN: Transferrin: 98 mg/dL — ABNORMAL LOW (ref 180–329)

## 2019-12-30 LAB — IRON AND TIBC
Iron: 25 ug/dL — ABNORMAL LOW (ref 45–182)
Saturation Ratios: 18 % (ref 17.9–39.5)
TIBC: 137 ug/dL — ABNORMAL LOW (ref 250–450)
UIBC: 112 ug/dL

## 2019-12-30 LAB — FERRITIN: Ferritin: 649 ng/mL — ABNORMAL HIGH (ref 24–336)

## 2019-12-30 LAB — MAGNESIUM: Magnesium: 1.5 mg/dL — ABNORMAL LOW (ref 1.7–2.4)

## 2019-12-30 MED ORDER — POTASSIUM CHLORIDE 20 MEQ PO PACK
40.0000 meq | PACK | Freq: Once | ORAL | Status: AC
  Administered 2019-12-30: 40 meq via NASOGASTRIC
  Filled 2019-12-30: qty 2

## 2019-12-30 MED ORDER — DEXTROSE-NACL 5-0.45 % IV SOLN: INTRAVENOUS | Status: DC

## 2019-12-30 MED ORDER — MAGNESIUM SULFATE 2 GM/50ML IV SOLN
2.0000 g | Freq: Once | INTRAVENOUS | Status: AC
  Administered 2019-12-30: 2 g via INTRAVENOUS
  Filled 2019-12-30: qty 50

## 2019-12-30 MED ORDER — SODIUM CHLORIDE 0.9 % IV SOLN
510.0000 mg | Freq: Once | INTRAVENOUS | Status: AC
  Administered 2019-12-30: 510 mg via INTRAVENOUS
  Filled 2019-12-30: qty 17

## 2019-12-30 NOTE — Progress Notes (Signed)
PT Cancellation Note  Patient Details Name: Adam Marshall MRN: [id number] DOB: October 24, 1960   Cancelled Treatment:    Reason Eval/Treat Not Completed: Other (comment).  Has concerns about the removal of trach today, will re-attempt as time and pt allow.   Ramond Dial 12/30/2019, 4:33 PM   Mee Hives, PT MS Acute Rehab Dept. Number: [place] and [place]

## 2019-12-30 NOTE — Progress Notes (Signed)
NAME:  Adam Marshall, MRN:  [id number], DOB:  1961/06/17, LOS: 8 ADMISSION DATE:  12/12/2019, CONSULTATION DATE:  12/07/2019 REFERRING MD: Dr. Regenia Skeeter , CHIEF COMPLAINT:  Altered mental status  Brief History   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   Per ED notes, EMS reports that roommate noted AMS with difficulty ambulating to bathroom requiring depends. EMS also reported initial BP of 44/20.   4/20: Experienced hypoxic respiratory failure requiring trach intubation and placed on vent. Suction revealed mucus plugs likely cause. Respiratory status improving. Hypotension continues to be an ongoing problem, however he appears fluid overloaded. Levophed started and maintaining goal MAPs. Lactate and Scr continue to downtrend. WBC and Hgb still stable, however platelets now 13 from 24. CT scan shows some improvement in peripancreatic fluid with no evidence of necrosis.   Penile and scrotal edema continue to worsen. There is now concern for vascular compromise. Urology consulted, appreciate recs.   4/21: Platelets and hgb continued to downtrend  requiring 1 unit of each. Repletion of electrolytes completed as well. Overall clinical presentation appears to have improved. Patient is more responsive on exam, maintaining goal MAPs w/o pressors and satting well on trach collar.    4/22: Platelets and hgb now stable after units. Overall clinical presentation continued to improve and patient was transitioned to progressive care.  Extubated >>4/21  Transferred to progressive care >>4/22  4/26 PCCM follow up-->trach removed.  Consults:  Urology  Procedures:  4/19 NG tube placed 4/20 Central line, art-line and trach tube placed   Significant Diagnostic Tests:   4/21 CT ABD/PELVIS w contrast:  IMPRESSION: 1. Acute edematous pancreatitis. No evidence of pancreatic necrosis. 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 This is slightly decreased in size from 2017 MRI. 2. No acute peripancreatic fluid collection. Free fluid adjacent to the pancreas related to ascites but no focal fluid collection. 3. Edematous appearing distal stomach, scattered small bowel, and left colon, likely reactive, but nonspecific. 4. Small to moderate volume abdominopelvic ascites, increased from CT yesterday. Small to moderate bilateral pleural effusions also increased from yesterday. 5. Hepatic cirrhosis. Innumerable small low-density lesions within the liver are too small to characterize, may represent cysts or biliary hamartomas. This was also seen on 04/26/2016 abdominal MRI. 6. Absent renal excretion on delayed phase imaging, suggesting underlying renal dysfunction. 7. Advanced aortic atherosclerosis.  4/19 CT ABD/PELVIS w/o contrast:1. Redemonstration of the diffusely edematous appearing pancreas with increasing and now extensive peripancreatic inflammation. Evaluation for pancreatic necrosis is limited in the absence of contrast. 2. There are enlarging  peripancreatic fluid  collections particularly extending from the anterior pancreatic body and tail as well as a within the pancreaticoduodenal groove, lesser sac, and tracking along the pericolic gutters. Suspect these likely reflect acute peripancreatic collections in the absence of demonstrable pancreatic necrosis for the modified Lana criteria. 3. Increasing volume loss in both lower lobes, right slightly greater than left, with small bilateral pleural effusions which are new from the comparison exam. Underlying infection/inflammation is not excluded. 4. Mild gallbladder distention with intermediate attenuation (31 HU). Finding compatible with biliary sludge seen on ultrasound. 5. Diffuse gastric antral, duodenal and colonic wall thickening most pronounced in the hepatic and splenic flexures. Findings are favored to represent reactive inflammation secondary to the adjacent pancreatitis. 6. Colonic diverticulosis without evidence of diverticulitis. 7. Mild edematous changes of the external genitalia are favored to reflect volume third-spacing in the absence of additional focal features of infection. Correlate with visual inspection and exam findings as aggressive/necrotizing soft tissue infection is a clinical diagnosis. 8. Aortic Atherosclerosis (ICD10-I70.0).  4/18 RUQ ABD U/S: Features are suggestive of acute cholecystitis in the appropriate clinical setting though a sonographic Murphy sign was unable to be fully assessed due to an unresponsive patient.Mild dilatation of the extrahepatic common bile duct, no visible intraductal stones. If there is concern for choledocholithiasis, MRCP could be obtained. Diffusely increased hepatic echogenicity, most often seen with hepatic steatosis. Edematous pancreas with surrounding peripancreatic fluid compatible with features of pancreatitis seen on the same day CT.  4/18 CT Head w/o contrast:1. No acute intracranial findings. 2. Progressive  periventricular white matter hypodensity compatible with chronic ischemic microvascular white matter disease. 3. Increased prominence of sulci along with increased ventricular prominence which is likely ex vacuo.  4/18 CT ABD/PELVIS: 1. Interval acute, uncomplicated pancreatitis. 2. Diffuse low density wall thickening involving the proximal descending colon and splenic flexure, compatible with inflammatory changes due to the adjacent pancreatitis. 3. Colonic diverticulosis.4. Sludge in the gallbladder.  It has. 5. Stable bilateral adrenal hyperplasia. 6. Foley catheter in the urinary bladder with a suggestion of moderate diffuse bladder wall thickening, possibly representing changes due to cystitis.      Micro Data:  4/18  BCx, UCx NGTD @ 4 days  Antimicrobials:  4/18: Metronidazole x1 4/18: Cefepime >>4/20 4/18: Vancomycin >> 4/19 4/21: Meropenem >>4/27  Interim history/subjective:  Appears comfortable   Objective   Blood pressure 120/86, pulse 90, temperature (Abnormal) 97.4 F (36.3 C), temperature source Oral, resp. rate 17, height 5\' 6"  (1.676 m), weight 61.7 kg, SpO2 100 %.    FiO2 (%):  [28 %] 28 %   Intake/Output Summary (Last 24 hours) at 12/30/2019 1027 Last data filed at 12/30/2019 0842 Gross per 24 hour  Intake 1270 ml  Output 1350 ml  Net -80 ml   Filed Weights   12/28/19 0400 12/29/19 0500 12/30/19 0026  Weight: 59.1 kg 59 kg 61.7 kg    Examination: General this is a cachectic 59 year old black male he is resting in bed. Not in acute distress.  HENT trach now removed. Laryngectomy stoma unremarkable  Pulm some scattered rhonchi. No accessory use currently on 28% ATC Card RRR w/ holosystolic murmur  abd not tender Ext no edema  Neuro interactive. Follows commands     Resolved Hospital Problem list   Hypovolemic shock  Metabolic Acidosis 2/2 Lactic Acidosis Acute hypoxic respiratory failure 2/2 mucous plugging. Mechanical ventilation stopped 4/21; 28%  ATC as of 4/21 Ventilator induced respiratory alkalosis  AKI  Assessment & Plan:  Acute  on chronic alcoholic Acute Pancreatitis (resolving) H/o head and neck cancer s/p Laryngectomy 2017  Intermittent Fluid and electrolyte abnormality  Liver Cirrhosis 2/2 ETOH Multifactorial anemia: stable  Thrombocytopenia: likely related to underlying liver disease exacerbated by acute illness->improving   Acute metabolic encephalopathy: slowly improving   Discussion Seems to be improving. No need for suctioning. Has tolerated 28% ATC now for several days.  I removed his trach, he has prior surgical laryngectomy and does not need the trach to maintain this.   Plan Cont airway humidification Wean oxygen to off as tolerated Continue routine stoma care  All other issues per IM service we will sign off  Adam Marshall [place] Pager # [phone] OR # [phone] if no answer

## 2019-12-30 NOTE — Progress Notes (Signed)
PT Cancellation Note  Patient Details Name: Adam Marshall MRN: [id number] DOB: 01/19/1961   Cancelled Treatment:    Reason Eval/Treat Not Completed: Patient declined, no reason specified.  Retry as time and pt allow.   Ramond Dial 12/30/2019, 1:08 PM   Mee Hives, PT MS Acute Rehab Dept. Number: [place] and [place]

## 2019-12-30 NOTE — Progress Notes (Signed)
Patient CBG 63. Offered juice to patient, patient refused. Hand tremor is present but otherwise patient is asymptomatic. Will continue to encourage PO intake. Provider notified.

## 2019-12-30 NOTE — Progress Notes (Addendum)
CBG 63. Continues to refuse PO juice. Provider notified. No new orders at this time.

## 2019-12-30 NOTE — Progress Notes (Signed)
PROGRESS NOTE    Adam Marshall  [id number] DOB: 1961/06/24 DOA: 01/01/2020 PCP: [name], FNP    Brief Narrative:  Patient was admitted to the hospital with a working diagnosis ofsepticshock due to acute pancreatitis, complicated by hypothermia, respiratory failure, metabolic encephalopathyand acute kidney injury.  59 year old male who presented with altered mental status. 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 He also has prostate cancer,andalcoholic cirrhosis.Patient had several days of altered mentation, EMS was called and patient was found hypotensive,on arrival to the ED his temperature was 83.3, blood pressure 89/50, heart rate 49.He was cachectic and ill looking appearance, positive systolic murmur, his lungs had decreased breath sounds bilaterally, his abdomen was tender in the epigastric region and left upper quadrant, no lower extremity edema.Patient was unable to follow commands but no signs of focality.  Patient was placed on broad-spectrum antibiotic therapy and vasopressors. On April 20 he developed respiratory distress, a Shiley 8 tracheostomy tube was inserted and patient was placed on mechanical ventilation. The following day on April 21 she was transitioned back to trach collar. Patient had an NG tube placed for feedings.  Patient required vasopressors while in the intensive care unit.  Transferred to [facility] on 12/27/19.  Patient with refeeding syndrome, aggressive correction of P, Mg, Ca and K. Patient with decreased po intake, continue Ng tube for supportive feedings and for medications.   Patient is allowed to eat, no risk of aspiration, no clinical signs of esophageal tracheal fistula. Diet changed to regular and will attempt to remove NG tube.  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 Septic shock (end-organ failure hypotension), due to acute alcoholic pancreatitis (present on admssion). CT abdomenwith nopancreaticabscess (04/21). Completed antibiotic therapy with meropenem.Continue to be stable wbc at 6,0. No significant abdominal pain.   Will continue to encourage po intake and will remove NG tube for now. He has remained hemodynamically stable.   2. Acute hypoxemic respiratory failure/ Hx of neck cancer sp laryngectomy in 2017 and neck dissection sp radiation therapy.Continue trach collar with good toleration. Currently he has a cuffed trach #6 Shiley. Patient oxygen saturation has been 100% on trach collar 5 L./ min. Patient managing secretions well, on trach collar since 04/21.   Will changed to uncuffed trach, and continue oxymetry monitoring.   3. Liver cirrhosis due to alcohol/multifactorial anemia/ anemia of chronic disease/ combined with iron deficiency. Thrombocytopenia.sp 2 units PRBC transfusion since admission. Stable Hgb at 7.2 with Hct at 20. Combined anemia of chronic disease with iron deficiency:  transferrin saturation down to 18, TIBC 137, iron 25 and ferritin 649.   Will add IV iron and will continue close monitoring. PRBC transfusion if Hgb less than 7. Currently no clinical signs of bleeding.    4. AKI, Hypoglycemia, Hypokalemia, hypophosphatemia, hypocalcemia and hypomagnesemia/ severe calorie protein malnutrition with refeeding syndrome.  Improved renal function with serum cr down to 0,91,el;ectrolytes with:  K at 3,7, bicarbonate 25, P 5,0, Ca 8,5, Mg 1,5.   Now will discontinue P  supplementation, continue K and Mg correction with Kcl 40 meq and 2 g Mag sulfate. Follow on renal function in am.    5. Penile lesion and Hx of prostate cancer.Local wound care.Will keep  foley catheter.  6. Metabolic encephalopathy.Tolerating wellthiamine and multivitamins. Will attempt to remove NG tube and continue oral feedings, follow with PT and OT evaluations.   Right subclavian central line. 04/20  DVT prophylaxis:scd Code Status:full Family Communication:No family at the bedside Disposition Plan: Patient is from:Home Anticipated DC to:snf Anticipated DC date:To be determined Anticipated DC barriers:Patient critically ill needing close monitoring of electrolytes and hemodynamics.    Nutrition Status: Nutrition Problem: Severe Malnutrition Etiology: chronic illness(SCC of pyrifoam sinus s/p laryngectomy and neck dissection) Signs/Symptoms: severe fat depletion, severe muscle depletion Interventions: Tube feeding   Consultants:  Urology  Procedures:  Right subclavian central line 04/20  Antimicrobials:  Meropenemcompleted therapy      Subjective: Patient is feeling better, his po intake has improved, no nausea or vomiting and tolerating po. No abdominal pain or evident chest pain. Communication is limited.   Objective: Vitals:   12/30/19 0026 12/30/19 0400 12/30/19 0401 12/30/19 0733  BP:  (!) 145/87  120/86  Pulse:  92 91 90  Resp:  (!) 22 (!) 21 15  Temp: (!) 97.5 F (36.4 C) 97.8 F (36.6 C)  (!) 97.4 F (36.3 C)  TempSrc:    Oral  SpO2:  100% 100% 100%  Weight: 61.7 kg     Height:        Intake/Output Summary (Last 24 hours) at 12/30/2019 0858 Last data filed at 12/30/2019 0842 Gross per 24 hour  Intake 1270 ml  Output 1350 ml  Net -80 ml   Filed Weights   12/28/19  0400 12/29/19 0500 12/30/19 0026  Weight: 59.1 kg 59 kg 61.7 kg    Examination:   General: Not in pain or dyspnea, deconditioned  Neurology: Awake and alert, non focal  E ENT: no pallor, no icterus, oral mucosa moist,. Trach in place.  Cardiovascular: No JVD. S1-S2 present, rhythmic, no gallops, rubs, or murmurs. No lower extremity edema. Pulmonary: positive breath sounds bilaterally, adequate air movement, no wheezing, mild expiratory rhonchi, but not frank rales. Gastrointestinal. Abdomen with no organomegaly, non tender, no rebound or guarding Skin. No rashes Musculoskeletal: no joint deformities     Data Reviewed: I have personally reviewed following labs and imaging studies  CBC: Recent Labs  Lab 12/24/19 0006 12/24/19 1657 12/25/19 0634 12/25/19 0732 12/26/19 0331 12/26/19 1820 12/27/19 1034 12/29/19 0350 12/30/19 0440  WBC 5.0  --  5.6   < > 4.9 6.1 5.4 6.7 6.0  NEUTROABS 4.6  --  5.1  --   --   --  4.7  --   --   HGB 8.0*   < > 7.6*   < > 6.9* 8.4* 8.5* 7.2* 7.2*  HCT 22.3*   < > 21.6*   < > 20.1* 24.5* 24.3* 20.5* 20.9*  MCV 89.2  --  92.3   < > 93.5 90.1 89.3 89.9 91.7  PLT 24*  --  13*   < > 9* 34* 27* 32* 51*   < > = values in this interval not displayed.   Basic Metabolic Panel: Recent Labs  Lab 12/24/19 1031 12/24/19 1657 12/26/19 0331 12/27/19 1034 12/28/19 0448 12/29/19 0350 12/30/19 0440  NA 137   < > 137 137 137 138 138  K 2.8*   < > 3.3* 3.1* 3.6 3.4*  3.7  CL 96*   < > 99 103 102 104 103  CO2 27   < > 29 26 27 24 25   GLUCOSE 146*   < > 120* 143* 131* 112* 83  BUN 39*   < > 26* 26* 31* 27* 29*  CREATININE 1.82*   < > 1.41* 0.95 1.00 1.23 0.91  CALCIUM 6.4*   < > 6.4* 7.2* 7.7* 7.8* 8.5*  MG 1.9  --   --  1.1* 1.6* 1.9 1.5*  PHOS 2.9  --   --  <1.0* 2.2* 3.4 5.0*   < > = values in this interval not displayed.   GFR: Estimated Creatinine Clearance: 76.3 mL/min (by C-G formula based on SCr of 0.91 mg/dL). Liver Function Tests: Recent  Labs  Lab 12/25/19 0958 12/26/19 0331  AST 55* 73*  ALT 38 46*  ALKPHOS 189* 292*  BILITOT 1.5* 1.3*  PROT 4.4* 4.3*  ALBUMIN 1.9* 1.7*   Recent Labs  Lab 12/24/19 1031  LIPASE 959*   No results for input(s): AMMONIA in the last 168 hours. Coagulation Profile: No results for input(s): INR, PROTIME in the last 168 hours. Cardiac Enzymes: No results for input(s): CKTOTAL, CKMB, CKMBINDEX, TROPONINI in the last 168 hours. BNP (last 3 results) No results for input(s): PROBNP in the last 8760 hours. HbA1C: No results for input(s): HGBA1C in the last 72 hours. CBG: Recent Labs  Lab 12/30/19 0022 12/30/19 0116 12/30/19 0403 12/30/19 0617 12/30/19 0735  GLUCAP 79 76 63* 69* 78   Lipid Profile: No results for input(s): CHOL, HDL, LDLCALC, TRIG, CHOLHDL, LDLDIRECT in the last 72 hours. Thyroid Function Tests: No results for input(s): TSH, T4TOTAL, FREET4, T3FREE, THYROIDAB in the last 72 hours. Anemia Panel: Recent Labs    12/30/19 0440  FERRITIN 649*  TIBC 137*  IRON 25*      Radiology Studies: I have reviewed all of the imaging during this hospital visit personally     Scheduled Meds: . chlorhexidine  15 mL Mouth Rinse BID  . Chlorhexidine Gluconate Cloth  6 each Topical Daily  . magnesium oxide  400 mg Per NG tube TID  . mouth rinse  15 mL Mouth Rinse q12n4p  . multivitamin  15 mL Per Tube Daily  . potassium & sodium phosphates  1 packet Per NG tube TID WC & HS  . sodium chloride flush  10-40 mL Intracatheter Q12H  . thiamine  100 mg Per NG tube Daily   Continuous Infusions: . sodium chloride 10 mL/hr at 12/28/19 1114  . sodium chloride 250 mL (12/28/19 1044)  . feeding supplement (VITAL AF 1.2 CAL) 1,000 mL (12/29/19 1151)     LOS: 8 days        Giovanna Kemmerer Gerome Apley, MD

## 2019-12-30 NOTE — Care Management Important Message (Signed)
Important Message  Patient Details  Name: Adam Marshall MRN: [id number] Date of Birth: 1960/09/16   Medicare Important Message Given:  Yes     [name] 12/30/2019, 8:41 AM

## 2019-12-30 NOTE — Progress Notes (Signed)
Patient accepted 279mL of grape juice. Rechecked CBG and obtained a value of 69. Provider notified. No new orders at this time.

## 2019-12-31 DIAGNOSIS — K8522 Alcohol induced acute pancreatitis with infected necrosis: Secondary | ICD-10-CM | POA: Diagnosis not present

## 2019-12-31 DIAGNOSIS — G9341 Metabolic encephalopathy: Secondary | ICD-10-CM | POA: Diagnosis not present

## 2019-12-31 DIAGNOSIS — K7030 Alcoholic cirrhosis of liver without ascites: Secondary | ICD-10-CM | POA: Diagnosis not present

## 2019-12-31 DIAGNOSIS — N179 Acute kidney failure, unspecified: Secondary | ICD-10-CM | POA: Diagnosis not present

## 2019-12-31 LAB — BASIC METABOLIC PANEL
Anion gap: 10 (ref 5–15)
BUN: 26 mg/dL — ABNORMAL HIGH (ref 6–20)
CO2: 24 mmol/L (ref 22–32)
Calcium: 8.8 mg/dL — ABNORMAL LOW (ref 8.9–10.3)
Chloride: 101 mmol/L (ref 98–111)
Creatinine, Ser: 0.78 mg/dL (ref 0.61–1.24)
GFR calc Af Amer: >60 mL/min (ref >60)
GFR calc non Af Amer: >60 mL/min (ref >60)
Glucose, Bld: 119 mg/dL — ABNORMAL HIGH (ref 70–99)
Potassium: 3.9 mmol/L (ref 3.5–5.1)
Sodium: 135 mmol/L (ref 135–145)

## 2019-12-31 LAB — GLUCOSE, CAPILLARY
Glucose-Capillary: 74 mg/dL (ref 70–99)
Glucose-Capillary: 89 mg/dL (ref 70–99)
Glucose-Capillary: 82 mg/dL (ref 70–99)
Glucose-Capillary: 74 mg/dL (ref 70–99)
Glucose-Capillary: 72 mg/dL (ref 70–99)
Glucose-Capillary: 65 mg/dL — ABNORMAL LOW (ref 70–99)
Glucose-Capillary: 52 mg/dL — ABNORMAL LOW (ref 70–99)
Glucose-Capillary: 83 mg/dL (ref 70–99)

## 2019-12-31 LAB — MAGNESIUM: Magnesium: 1.6 mg/dL — ABNORMAL LOW (ref 1.7–2.4)

## 2019-12-31 LAB — PHOSPHORUS: Phosphorus: 4.6 mg/dL (ref 2.5–4.6)

## 2019-12-31 MED ORDER — PROSIGHT PO TABS
1.0000 | ORAL_TABLET | Freq: Every day | ORAL | Status: DC
  Administered 2020-01-01: 1 via ORAL
  Filled 2019-12-31: qty 1

## 2019-12-31 MED ORDER — DEXTROSE IN LACTATED RINGERS 5 % IV SOLN: INTRAVENOUS | Status: DC

## 2019-12-31 MED ORDER — DEXTROSE IN LACTATED RINGERS 5 % IV SOLN: INTRAVENOUS | Status: AC

## 2019-12-31 MED ORDER — MAGNESIUM SULFATE 4 GM/100ML IV SOLN
4.0000 g | Freq: Once | INTRAVENOUS | Status: AC
  Administered 2019-12-31: 4 g via INTRAVENOUS
  Filled 2019-12-31: qty 100

## 2019-12-31 NOTE — Progress Notes (Addendum)
Hypoglycemic Event  CBG: 65  Treatment: cup of juice  Symptoms: none  Follow-up CBG: Time: 2047 CBG Result:52  Possible Reasons for Event: low PO intake  Comments/MD notified: protocol follwed  CBG decreased to 52 after juice.  Gave pt one more cup of juice and will recheck in 68min.  2117-after juice and snack recheck CBG 83.  2119-[facility] MD notified.   2129-D5 in LR increased to [place]

## 2019-12-31 NOTE — Progress Notes (Signed)
PROGRESS NOTE    Adam Marshall  [id number] DOB: Jan 22, 1961 DOA: 12/20/2019 PCP: [name], FNP    Brief Narrative:  Patient was admitted to the hospital with a working diagnosis ofsepticshock due to acute pancreatitis, complicated by hypothermia, respiratory failure, metabolic encephalopathyand acute kidney injury.  59 year old male who presented with altered mental status. 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 He also has prostate cancer,andalcoholic cirrhosis.Patient had several days of altered mentation, EMS was called and patient was found hypotensive,on arrival to the ED his temperature was 83.3, blood pressure 89/50, heart rate 49.He was cachectic and ill looking appearance, positive systolic murmur, his lungs had decreased breath sounds bilaterally, his abdomen was tender in the epigastric region and left upper quadrant, no lower extremity edema.Patient was unable to follow commands but no signs of focality.  Patient was placed on broad-spectrum antibiotic therapy and vasopressors. On April 20 he developed respiratory distress, a Shiley 8 tracheostomy tube was inserted and patient was placed on mechanical ventilation. The following day on April 21 she was transitioned back to trach collar. Patient had an NG tube placed for feedings.  Patient required vasopressors while in the intensive care unit.  Transferred to [facility] on 12/27/19.  Patient with refeeding syndrome, aggressive correction of P, Mg, Ca and K.Patient with decreased po intake, continue Ng tube for supportive feedings and for medications.  Patient is allowed to eat, no risk of aspiration, no clinical signs of esophageal tracheal fistula. Diet changed to regular and will attempt to remove NG tube if maintains po intake.  Lurline Idol was removed on 04/26, with good  toleration.   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 Septic shock (end-organ failure hypotension), due to acute alcoholic pancreatitis (present on admssion). CT abdomenwith nopancreaticabscess (04/21). Completed antibiotic therapy with meropenem.Patient with no significant abdominal pain, positive diarrhea.   Continue to encourage po intake, will hold is tube feedings for today and will monitor his po intake, if he maintains po intake and take his medications by mouth will remove NG.   2. Acute hypoxemic respiratory failure/ Hx of neck cancer sp laryngectomy in 2017 and neck dissection sp radiation therapy.Trach cannula has been removed with good toleration, will continue trach collar and oxymetry monitoring.   Out of bed as tolerated and physical/ occupational therapy.   3. Liver cirrhosis due to alcohol/multifactorial anemia/ anemia of chronic disease/ combined with iron deficiency. Thrombocytopenia.sp 2 units PRBC transfusion since admission. One dose of IV iron. (transferrin saturation down to 18, TIBC 137, iron 25 and ferritin 649).   No signs of bleeding and his cell count has remain stable with Hgb 7 to 8, his platelets have been trending up, last check up to 51. Will plan to check cell count in am.   4. AKI, Hypoglycemia, Hypokalemia, hypophosphatemia, hypocalcemia and hypomagnesemia/ severe calorie protein malnutrition with refeeding syndrome.  His electrolytes have improved, with Na at 139, K at 3,9, Cl 101, bicarb 24, P 4,6, Ca 8,8 but Mg continue to be  low at 1,6. Glucose has been between 72 and  82.  Will give 4 g mag sulfate today, and will continue to encourage po intake, follow on electrolytes in am. His renal function stable at 0,78.  Continue mag oxide po tid 400 mg.   Will continue low dose IV dextrose for now to prevent worsening hypoglycemia.   5. Penile lesion and Hx of prostate cancer.Local wound care.Plan to remove foley catheter today.  6. Metabolic encephalopathy.Continue withthiamine and multivitamins. Patient following commands and off mittens. Continue neuro checks per unit protocol. PT and OT   Right subclavian central line. 04/20 order to remove central line today.   DVT prophylaxis:scd Code Status:full Family Communication:No family at the bedside Disposition Plan: Patient is from:Home Anticipated DC to:snf Anticipated DC date:To be determined Anticipated DC barriers:Patient critically ill needing close monitoring of electrolytes and glucose, still dependent on NG tube support.     Nutrition Status: Nutrition Problem: Severe Malnutrition Etiology: chronic illness(SCC of pyrifoam sinus s/p laryngectomy and neck dissection) Signs/Symptoms: severe fat depletion, severe muscle depletion Interventions: Tube feeding   Consultants:  Urology  Procedures:  Right subclavian central line 04/20  Antimicrobials:  Meropenemcompleted therapy    Subjective: Patient looks more comfortable today, his trach has been removed. Increased po intake, but continue to be very weak and deconditioned, communication is limited due to laryngeal resection.   Objective: Vitals:   12/31/19 0400 12/31/19 0427 12/31/19 0500 12/31/19 0753  BP: (!) 173/113   (!) 147/99  Pulse:  96  80  Resp:  20  20  Temp:   97.6 F (36.4 C) 97.6 F (36.4 C)  TempSrc:   Oral Oral  SpO2:  98%  98%  Weight:       Height:        Intake/Output Summary (Last 24 hours) at 12/31/2019 0814 Last data filed at 12/31/2019 [id number] Gross per 24 hour  Intake 4321.29 ml  Output --  Net 4321.29 ml   Filed Weights   12/28/19 0400 12/29/19 0500 12/30/19 0026  Weight: 59.1 kg 59 kg 61.7 kg    Examination:   General: Not in pain or dyspnea, deconditioned  Neurology: Awake and alert, non focal  E ENT:  mild pallor, no icterus, oral mucosa moist/ trach in place.  Cardiovascular: No JVD. S1-S2 present, rhythmic, no gallops, rubs, or murmurs. No lower extremity edema. Pulmonary: positive breath sounds bilaterally,  no wheezing,scattered rhonchi or rales. Gastrointestinal. Abdomen with, no organomegaly, non tender, no rebound or guarding Skin. No rashes Musculoskeletal: no joint deformities     Data Reviewed: I have personally reviewed following labs and imaging studies  CBC: Recent Labs  Lab 12/25/19 0634 12/25/19 0732 12/26/19 0331 12/26/19 1820 12/27/19 1034 12/29/19 0350 12/30/19 0440  WBC 5.6   < > 4.9 6.1 5.4 6.7 6.0  NEUTROABS 5.1  --   --   --  4.7  --   --   HGB 7.6*   < > 6.9* 8.4* 8.5* 7.2* 7.2*  HCT 21.6*   < > 20.1* 24.5* 24.3* 20.5* 20.9*  MCV 92.3   < > 93.5 90.1 89.3 89.9 91.7  PLT 13*   < > 9* 34* 27* 32* 51*   < > = values in this interval not displayed.   Basic Metabolic Panel: Recent Labs  Lab 12/27/19 1034 12/28/19 0448 12/29/19 0350 12/30/19 0440 12/31/19 0459  NA 137 137 138 138 135  K 3.1* 3.6 3.4* 3.7 3.9  CL 103 102 104  103 101  CO2 26 27 24 25 24   GLUCOSE 143* 131* 112* 83 119*  BUN 26* 31* 27* 29* 26*  CREATININE 0.95 1.00 1.23 0.91 0.78  CALCIUM 7.2* 7.7* 7.8* 8.5* 8.8*  MG 1.1* 1.6* 1.9 1.5* 1.6*  PHOS <1.0* 2.2* 3.4 5.0* 4.6   GFR: Estimated Creatinine Clearance: 86.8 mL/min (by C-G formula based on SCr of 0.78 mg/dL). Liver Function Tests: Recent Labs  Lab 12/25/19 0958 12/26/19 0331  AST 55* 73*  ALT 38 46*  ALKPHOS 189* 292*  BILITOT 1.5*  1.3*  PROT 4.4* 4.3*  ALBUMIN 1.9* 1.7*   Recent Labs  Lab 12/24/19 1031  LIPASE 959*   No results for input(s): AMMONIA in the last 168 hours. Coagulation Profile: No results for input(s): INR, PROTIME in the last 168 hours. Cardiac Enzymes: No results for input(s): CKTOTAL, CKMB, CKMBINDEX, TROPONINI in the last 168 hours. BNP (last 3 results) No results for input(s): PROBNP in the last 8760 hours. HbA1C: No results for input(s): HGBA1C in the last 72 hours. CBG: Recent Labs  Lab 12/30/19 1140 12/30/19 1731 12/30/19 1936 12/31/19 0503 12/31/19 0741  GLUCAP 83 72 63* 89 82   Lipid Profile: No results for input(s): CHOL, HDL, LDLCALC, TRIG, CHOLHDL, LDLDIRECT in the last 72 hours. Thyroid Function Tests: No results for input(s): TSH, T4TOTAL, FREET4, T3FREE, THYROIDAB in the last 72 hours. Anemia Panel: Recent Labs    12/30/19 0440  FERRITIN 649*  TIBC 137*  IRON 25*      Radiology Studies: I have reviewed all of the imaging during this hospital visit personally     Scheduled Meds: . chlorhexidine  15 mL Mouth Rinse BID  . Chlorhexidine Gluconate Cloth  6 each Topical Daily  . magnesium oxide  400 mg Per NG tube TID  . mouth rinse  15 mL Mouth Rinse q12n4p  . multivitamin  15 mL Per Tube Daily  . sodium chloride flush  10-40 mL Intracatheter Q12H  . thiamine  100 mg Per NG tube Daily   Continuous Infusions: . sodium chloride 10 mL/hr at 12/28/19 1114  . sodium chloride Stopped (12/29/19 0921)  . dextrose 5 % and 0.45% NaCl 75 mL/hr at 12/30/19 2123  . feeding supplement (VITAL AF 1.2 CAL) 1,000 mL (12/31/19 0110)     LOS: 9 days        Adam Goto Gerome Apley, MD

## 2019-12-31 NOTE — Progress Notes (Signed)
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 Admitted to ICU for septic shock secondary to possible GI etiology in the setting of recent nausea and vomiting. Patient unable to provide history. Family unable to provide history. Chart reviewed.    PT Comments    Pt agreeable to getting up to chair to eat, per MD orders. Pt is able to follow commands and answer yes/no questions appropriately. Pt limited in safe mobility today by difficulty in attaining knee extension and posterior pelvic tilt with mobility in presence of generalized weakness. Pt requires maxAx2 for bed mobility and totalA for coming to standing in [place] to transfer to recliner. D/c plans remain appropriate at this time. PT will continue to follow acutely.     Follow Up Recommendations  Supervision/Assistance - 24 hour;CIR     Equipment Recommendations  Other (comment)(TBA)       Precautions / Restrictions Precautions Precautions: Fall Restrictions Weight Bearing Restrictions: No    Mobility  Bed Mobility Overal bed mobility: Needs Assistance Bed Mobility: Supine to Sit     Supine to sit: Max assist;+2 for physical assistance     General bed mobility comments: maxAx2 for movement of LE to EoB and utilization of pad to bring trunk to upright, scoot hips to EoB  Transfers Overall transfer level: Needs assistance   Transfers: Sit to/from Stand Sit to Stand: +2 safety/equipment;Total assist         General transfer comment: total A and 2 attempts to initially come upright enough to close Stedy pads, maxAx2 from higer Stedy pads but continued  difficulty with pelvic rotation and knee extension.      Balance Overall balance assessment: Needs assistance Sitting-balance support: No upper extremity supported Sitting balance-Leahy Scale: Fair     Standing balance support: Bilateral upper extremity supported;Single extremity supported;During functional activity Standing balance-Leahy Scale: Zero Standing balance comment: requires outside support                            Cognition Arousal/Alertness: Awake/alert Behavior During Therapy: WFL for tasks assessed/performed;Flat affect Overall Cognitive Status: No family/caregiver present to determine baseline cognitive functioning                                 General Comments: pt able to follow commands and answer yes/no questions      Exercises      General Comments General comments (skin integrity, edema, etc.): vss on 5L O2 via trach collar, in standing pt Flexiseal came out and he became incontinent of urine      Pertinent Vitals/Pain Pain Assessment: Faces Faces Pain Scale: Hurts little more Pain Location: generalized Pain Descriptors / Indicators: Grimacing;Guarding Pain Intervention(s): Limited activity within patient's tolerance;Monitored during session;Repositioned    Home Living Family/patient expects to be discharged to:: Unsure               Additional Comments: pt supposedly has a room mate.  Unable to determine any other information about the living situation    Prior Function  Comments: pt unable to give reliable information on PLOF.   PT Goals (current goals can now be found in the care plan section) Acute Rehab PT Goals Patient Stated Goal: pt unable PT Goal Formulation: Patient unable to participate in goal setting Time For Goal Achievement: 01/10/20 Potential to Achieve Goals: Fair Progress towards PT goals: Progressing toward goals    Frequency    Min 3X/week      PT Plan Current plan remains  appropriate       AM-PAC PT "6 Clicks" Mobility   Outcome Measure  Help needed turning from your back to your side while in a flat bed without using bedrails?: Total Help needed moving from lying on your back to sitting on the side of a flat bed without using bedrails?: Total Help needed moving to and from a bed to a chair (including a wheelchair)?: Total Help needed standing up from a chair using your arms (e.g., wheelchair or bedside chair)?: Total Help needed to walk in hospital room?: Total Help needed climbing 3-5 steps with a railing? : Total 6 Click Score: 6    End of Session Equipment Utilized During Treatment: Oxygen Activity Tolerance: Patient tolerated treatment well;Patient limited by fatigue Patient left: in chair;with call bell/phone within reach;with chair alarm set Nurse Communication: Mobility status PT Visit Diagnosis: Muscle weakness (generalized) (M62.81);Other abnormalities of gait and mobility (R26.89);Difficulty in walking, not elsewhere classified (R26.2)     Time: [id number] PT Time Calculation (min) (ACUTE ONLY): 37 min  Charges:  $Therapeutic Activity: 8-22 mins                     Dannica Bickham B. Migdalia Dk PT, DPT Acute Rehabilitation Services Pager [phone] Office [phone]    [place] 12/31/2019, 2:15 PM

## 2019-12-31 NOTE — Progress Notes (Signed)
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 Admitted to ICU for septic shock secondary to possible GI etiology in the setting of recent nausea and vomiting. Patient unable to provide history. Family unable to provide history. Chart reviewed.   OT comments  Pt currently progressing with alertness and command follow and progressing to OOB transfer. Pt unable to perform sit to stand without maxA +2 and use of stedy. Pt with poor extension in B/L hips/knees to perform erect posture. Pt minguardA for  Light grooming in recliner. Pt positioned well for comfort. RN alerted for lift pad to be delivered today as the equipment room was out of them.  Pt would benefit from continued OT skilled services. OT following acutely.     Follow Up Recommendations  CIR;Supervision/Assistance - 24 hour    Equipment Recommendations  Other (comment)    Recommendations for Other Services      Precautions / Restrictions Precautions Precautions: Fall Restrictions Weight Bearing Restrictions: No       Mobility Bed Mobility Overal bed mobility: Needs Assistance Bed Mobility: Supine to Sit     Supine to sit: Max assist;+2 for physical assistance     General bed mobility comments: Pt requiring hellicopter movement for trunk and BLE movement with very little initiation.  Transfers Overall transfer level: Needs assistance   Transfers: Sit to/from Stand Sit to Stand: Total assist;+2 physical assistance;+2 safety/equipment         General transfer comment: totalA x2 attempts for STS, but then requires maxA +2 for standing with  stedy.    Balance Overall balance assessment: Needs assistance Sitting-balance support: No upper extremity supported Sitting balance-Leahy Scale: Fair     Standing balance support: Bilateral upper extremity supported;Single extremity supported;During functional activity Standing balance-Leahy Scale: Zero Standing balance comment: requires external support constantly                           ADL either performed or assessed with clinical judgement   ADL Overall ADL's : Needs assistance/impaired Eating/Feeding: NPO Eating/Feeding Details (indicate cue type and reason): cortrak Grooming: Min guard;Sitting Grooming Details (indicate cue type and reason): minguardA required to bring washcloth to face     Lower Body Bathing: Total assistance   Upper Body Dressing : Minimal assistance;Sitting   Lower Body Dressing: Total assistance;Bed level;Sitting/lateral leans Lower Body Dressing Details (indicate cue type and reason): donning a new pair of socks             Functional mobility during ADLs: Maximal assistance;+2 for physical assistance;+2 for safety/equipment;Cueing for safety;Cueing for sequencing General ADL Comments: Pt limited by decreased activity tolerance, decreased strength, decreased ability to problem solve, and decreased mobility.     Vision   Vision Assessment?: Vision impaired- to be further tested in functional context   Perception     Praxis      Cognition Arousal/Alertness: Awake/alert Behavior During Therapy: [facility] for tasks assessed/performed;Flat affect Overall Cognitive Status: No family/caregiver present to determine baseline cognitive functioning  General Comments: pt able to follow commands and answer yes/no questions        Exercises     Shoulder Instructions       General Comments vss on 5L O2 via trach collar, in standing pt Flexiseal came out and he became incontinent of urine     Pertinent Vitals/ Pain       Pain Assessment: Faces Pain Score: 4  Faces Pain Scale: Hurts little more Pain Location: generalized Pain Descriptors / Indicators: Grimacing;Guarding Pain Intervention(s): Monitored during session  Home Living Family/patient expects to be discharged to:: Unsure                                 Additional Comments: pt supposedly has a room mate.  Unable to determine any other information about the living situation      Prior Functioning/Environment          Comments: pt unable to give reliable information on PLOF.   Frequency  Min 2X/week        Progress Toward Goals  OT Goals(current goals can now be found in the care plan section)  Progress towards OT goals: Progressing toward goals  Acute Rehab OT Goals Patient Stated Goal: pt unable OT Goal Formulation: Patient unable to participate in goal setting Time For Goal Achievement: 01/11/20 Potential to Achieve Goals: Good ADL Goals Pt Will Perform Grooming: with min assist;sitting Pt Will Transfer to Toilet: with max assist;stand pivot transfer;bedside commode Pt/caregiver will Perform Home Exercise Program: Increased strength;Both right and left upper extremity;With Supervision Additional ADL Goal #1: Pt will follow  (3) 1 step commands consistently with no verbal cues to attend to task. Additional ADL Goal #2: Pt will perform bed mobility with minA overall as a precursor to OOB tasks. Additional ADL Goal #3: Pt will maintain O2 sats >90% during x5 min ADL task.  Plan Discharge plan remains appropriate    Co-evaluation                 AM-PAC OT "6 Clicks" Daily Activity     Outcome Measure   Help from another person eating meals?: Total Help from another person taking care of personal grooming?: A Lot Help from another person toileting, which includes using toliet, bedpan, or urinal?: Total Help from another person bathing (including washing, rinsing,  drying)?: Total Help from another person to put on and taking off regular upper body clothing?: Total Help from another person to put on and taking off regular lower body clothing?: A Lot 6 Click Score: 8    End of Session Equipment Utilized During Treatment: Oxygen;Rolling walker;Gait belt  OT Visit Diagnosis: Unsteadiness on feet (R26.81);Muscle weakness (generalized) (M62.81);Pain;Cognitive communication deficit (R41.841) Symptoms and signs involving cognitive functions: Other cerebrovascular disease Pain - part of body: ("all over")   Activity Tolerance Patient limited by pain   Patient Left in chair;with call bell/phone within reach;with chair alarm set;Other (comment)(RN alerted of need for maximove for pt back to bed)   Nurse Communication Mobility status;Other (comment)(OTR ordered lift pad for proper sling for maximove)        Time: [id number] OT Time Calculation (min): 45 min  Charges: OT General Charges $OT Visit: 1 Visit OT Treatments $Self Care/Home Management : 8-22 mins $Therapeutic Activity: 8-22 mins  Jefferey Pica, OTR/L Acute Rehabilitation Services Pager: [phone] Office: [phone]    Rowe Warman  C 12/31/2019, 4:09 PM

## 2019-12-31 NOTE — Progress Notes (Signed)
RN Debbie aware of DC central line order, states she will attempt a PIV, if unsuccessful will place an IV team consult

## 2019-12-31 NOTE — Progress Notes (Signed)
Spoke with pt's nurse Debbie. She stated she was able to obtain PIV and asked that VAST come remove CL. VAST RN explained that this unit discontinues their own central lines if competency has been verified. Debbie, RN stated she is an ICU RN and has the competency; she will remove CL.

## 2019-12-31 NOTE — Progress Notes (Signed)
Nutrition Follow-up  DOCUMENTATION CODES:   Severe malnutrition in context of chronic illness  INTERVENTION:   Recommend transition to nocturnal tube feeding regimen: Osmolite 1.5 at 60 ml/h x 12 hours via Cortrak tube from 6pm-6am with pro-stat 30 ml BID to provide 1280 kcal, 75 gm protein, 549 ml free water daily.  Continue Regular diet, encourage PO's.  Add Ensure Enlive po BID to maximize oral intake, each supplement provides 350 kcal and 20 grams of protein.  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 Last seen by ENT 02/2019 with no evidence of recurrence and appears lost to follow-up. Past medical history is also significant for prostate cancer s/p RT, alcoholic cirrhosis and hx pancreatitis. Admitted for septic shock secondary to acute pancreatitis vs GI source. Currently protecting airway.  Patient is currently receiving Vital AF 1.2 at 30 ml/h via postpyloric Cortrak tube providing 864 kcal, 54 gm protein, 584 ml free water daily.  Diet advanced to regular on 4/24. Patient is eating poorly. RN reports that patient frequently refuses meals if asked if he wants to eat, but he will eat small amounts with some encouragement. Yesterday, he ate 5 bites of oatmeal and 2 grape juices for breakfast, refused to eat lunch, and had 25% of chicken and noodles and 1/2 of an apple juice for supper. This 24 hour recall provided by RN provides ~250 kcals and  5 gm protein meeting 12% of kcal needs and 5% of protein needs.   Plans to remove Cortrak tube if patient will eat a meal today.  Labs reviewed. Mag 1.6 (L) CBG's: [phone]  Medications reviewed and include Mag-ox, Prosight MVI, and thiamine, IV mag once today.  Weight is trending up. I/O +14.7 L since admit.  +1 edema to all extremities per RN documentation.  Diet Order:   Diet Order            Diet regular Room service appropriate? Yes; Fluid consistency: Thin  Diet effective now              EDUCATION NEEDS:   No education needs have been identified at this time  Skin:  Skin Assessment: Skin Integrity Issues: Skin Integrity Issues:: Other (Comment) Other: non-pressure wound to scrotum and penis  Last BM:  4/27  Height:   Ht Readings from Last 1 Encounters:  12/25/2019 5\' 6"  (1.676 m)    Weight:   Wt Readings from Last 1 Encounters:  12/30/19 61.7 kg    Ideal Body Weight:  59.1 kg  BMI:  Body mass index is 21.95 kg/m.  Estimated Nutritional Needs:   Kcal:  2000-2200  Protein:  110-125 grams  Fluid:  > 2 L    Molli Barrows, RD, LDN, CNSC Please refer to Amion for contact information.

## 2019-12-31 NOTE — Progress Notes (Signed)
Inpatient Rehab Admissions:  Inpatient Rehab Consult received.  I met with pt at the bedside for rehabilitation assessment. Pt able to follow some motor commands at bedside but I am unsure how much of the detailed information about CIR pt was able to comprehend. Pt indicated I can contact Theophilus Kinds, his sister, for follow up information. I have left a voicemail for Theophilus Kinds to see if we can gather more information about his home set up and support at DC as this will determine if CIR is an appropriate post acute venue for him once medically ready.   Will follow up once I have more information.   Raechel Ache, OTR/L  Rehab Admissions Coordinator  [phone] 12/31/2019 11:48 AM

## 2019-12-31 NOTE — Progress Notes (Signed)
CBG at 2357 resulted 74. Value did not cross over when unit was docked.

## 2020-01-01 DIAGNOSIS — C139 Malignant neoplasm of hypopharynx, unspecified: Secondary | ICD-10-CM | POA: Diagnosis not present

## 2020-01-01 DIAGNOSIS — E43 Unspecified severe protein-calorie malnutrition: Secondary | ICD-10-CM | POA: Diagnosis present

## 2020-01-01 DIAGNOSIS — G9341 Metabolic encephalopathy: Secondary | ICD-10-CM | POA: Diagnosis not present

## 2020-01-01 DIAGNOSIS — K852 Alcohol induced acute pancreatitis without necrosis or infection: Secondary | ICD-10-CM

## 2020-01-01 DIAGNOSIS — K7030 Alcoholic cirrhosis of liver without ascites: Secondary | ICD-10-CM | POA: Diagnosis not present

## 2020-01-01 DIAGNOSIS — R410 Disorientation, unspecified: Secondary | ICD-10-CM

## 2020-01-01 LAB — GLUCOSE, CAPILLARY
Glucose-Capillary: 102 mg/dL — ABNORMAL HIGH (ref 70–99)
Glucose-Capillary: 61 mg/dL — ABNORMAL LOW (ref 70–99)
Glucose-Capillary: 65 mg/dL — ABNORMAL LOW (ref 70–99)
Glucose-Capillary: 68 mg/dL — ABNORMAL LOW (ref 70–99)
Glucose-Capillary: 76 mg/dL (ref 70–99)
Glucose-Capillary: 79 mg/dL (ref 70–99)
Glucose-Capillary: 81 mg/dL (ref 70–99)
Glucose-Capillary: 87 mg/dL (ref 70–99)
Glucose-Capillary: 99 mg/dL (ref 70–99)

## 2020-01-01 LAB — CBC WITH DIFFERENTIAL/PLATELET
Abs Immature Granulocytes: 0.2 10*3/uL — ABNORMAL HIGH (ref 0.00–0.07)
Basophils Absolute: 0 10*3/uL (ref 0.0–0.1)
Basophils Relative: 1 %
Eosinophils Absolute: 0 10*3/uL (ref 0.0–0.5)
Eosinophils Relative: 1 %
HCT: 22.7 % — ABNORMAL LOW (ref 39.0–52.0)
Hemoglobin: 7.7 g/dL — ABNORMAL LOW (ref 13.0–17.0)
Immature Granulocytes: 6 %
Lymphocytes Relative: 7 %
Lymphs Abs: 0.3 10*3/uL — ABNORMAL LOW (ref 0.7–4.0)
MCH: 30.9 pg (ref 26.0–34.0)
MCHC: 33.9 g/dL (ref 30.0–36.0)
MCV: 91.2 fL (ref 80.0–100.0)
Monocytes Absolute: 0.3 10*3/uL (ref 0.1–1.0)
Monocytes Relative: 9 %
Neutro Abs: 2.8 10*3/uL (ref 1.7–7.7)
Neutrophils Relative %: 76 %
Platelets: 71 10*3/uL — ABNORMAL LOW (ref 150–400)
RBC: 2.49 MIL/uL — ABNORMAL LOW (ref 4.22–5.81)
RDW: 16.4 % — ABNORMAL HIGH (ref 11.5–15.5)
WBC: 3.7 10*3/uL — ABNORMAL LOW (ref 4.0–10.5)
nRBC: 0 % (ref 0.0–0.2)

## 2020-01-01 LAB — MAGNESIUM: Magnesium: 1.9 mg/dL (ref 1.7–2.4)

## 2020-01-01 LAB — BASIC METABOLIC PANEL
Anion gap: 11 (ref 5–15)
BUN: 21 mg/dL — ABNORMAL HIGH (ref 6–20)
CO2: 22 mmol/L (ref 22–32)
Calcium: 9 mg/dL (ref 8.9–10.3)
Chloride: 100 mmol/L (ref 98–111)
Creatinine, Ser: 0.6 mg/dL — ABNORMAL LOW (ref 0.61–1.24)
GFR calc Af Amer: >60 mL/min (ref >60)
GFR calc non Af Amer: >60 mL/min (ref >60)
Glucose, Bld: 81 mg/dL (ref 70–99)
Potassium: 4 mmol/L (ref 3.5–5.1)
Sodium: 133 mmol/L — ABNORMAL LOW (ref 135–145)

## 2020-01-01 LAB — PHOSPHORUS: Phosphorus: 4.7 mg/dL — ABNORMAL HIGH (ref 2.5–4.6)

## 2020-01-01 MED ORDER — ADULT MULTIVITAMIN W/MINERALS CH
1.0000 | ORAL_TABLET | Freq: Every day | ORAL | Status: DC
  Administered 2020-01-01 – 2020-01-03 (×3): 1 via ORAL
  Filled 2020-01-01 (×5): qty 1

## 2020-01-01 MED ORDER — ENSURE ENLIVE PO LIQD
237.0000 mL | Freq: Three times a day (TID) | ORAL | Status: DC
  Administered 2020-01-01 – 2020-01-02 (×4): 237 mL via ORAL
  Administered 2020-01-03: 150 mL via ORAL
  Administered 2020-01-04: 237 mL via ORAL

## 2020-01-01 MED ORDER — DEXTROSE-NACL 5-0.9 % IV SOLN: INTRAVENOUS | Status: DC

## 2020-01-01 NOTE — Progress Notes (Signed)
PROGRESS NOTE    Adam Marshall  [id number] DOB: May 18, 1961 DOA: 12/17/2019 PCP: [name], FNP     Brief Narrative:  Patient was admitted to the hospital with a working diagnosis ofsepticshock due to acute pancreatitis, complicated by hypothermia, respiratory failure, metabolic encephalopathyand acute kidney injury.  58-year-BM PMHx altered mental status. 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 He also has prostate cancer,andalcoholic cirrhosis.  Patient had several days of altered mentation, EMS was called and patient was found hypotensive,on arrival to the ED his temperature was 83.3, blood pressure 89/50, heart rate 49.He was cachectic and ill looking appearance, positive systolic murmur, his lungs had decreased breath sounds bilaterally, his abdomen was tender in the epigastric region and left upper quadrant, no lower extremity edema.Patient was unable to follow commands but no signs of focality.  Patient was placed on broad-spectrum antibiotic therapy and vasopressors. On April 20 he developed respiratory distress, a Shiley 8 tracheostomy tube was inserted and patient was placed on mechanical ventilation. The following day on April 21 she was transitioned back to trach collar. Patient had an NG tube placed for feedings.  Patient required vasopressors while in the intensive care unit.  Transferred to [facility] on 12/27/19.  Patient with refeeding syndrome, aggressive correction of P, Mg, Ca and K.Patient with decreased po intake, continue Ng tube for supportive feedings and for medications.  Patient is allowed to eat, no risk of aspiration, no clinical signs of esophageal tracheal fistula.Diet changed toregularand will attempt to remove NG tube if maintains po intake.  Lurline Idol was removed on 04/26, with good toleration.      Subjective: Alert, follows some commands, trach site open with blow-by.   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 Septic shock (end-organ failure hypotension), due to acute alcoholic pancreatitis (present on admssion). CT abdomenwith nopancreaticabscess (04/21). Completed antibiotic therapy with meropenem.Patient with no significant abdominal pain, positive diarrhea.   Continue to encourage po intake, will hold is tube feedings for today and will monitor his po intake, if he maintains po intake and take his medications by mouth will remove NG.   2. Acute hypoxemic respiratory failure/ Hx of neck cancer sp laryngectomy in 2017 and neck dissection sp radiation therapy.Trach cannula has been removed with good toleration, will continue trach collar and oxymetry monitoring.   Out of bed as tolerated and physical/ occupational therapy.   3. Liver cirrhosis due to alcohol/multifactorial anemia/ anemia of chronic disease/ combined with iron deficiency.Thrombocytopenia.sp 2 units PRBC transfusion since admission. One dose of IV iron. (transferrin saturation down to 18, TIBC 137, iron 25 and ferritin 649).   No signs of bleeding and his cell count has remain stable with Hgb 7 to 8, his platelets have been trending up, last check up to 51. Will plan to check cell count in am.   4. AKI, Hypoglycemia, Hypokalemia, hypophosphatemia, hypocalcemia and hypomagnesemia/ severe calorie protein malnutrition with refeeding syndrome.His electrolytes have improved, with Na at 139, K at 3,9, Cl 101, bicarb 24, P  4,6, Ca 8,8 but Mg  continue to be low at 1,6. Glucose has been between 72 and 82.  Will give 4 g mag sulfate today, and will continue to encourage po intake, follow on electrolytes in am. His renal function stable at 0,78. Continue mag oxide po tid 400 mg.   Will continue low dose IV dextrose for now to prevent worsening hypoglycemia.   5. Penile lesion and Hx of prostate cancer.Local wound care.Plan to remove foley catheter today.  6. Metabolic encephalopathy.Continue withthiamine and multivitamins. Patient following commands and off mittens. Continue neuro checks per unit protocol. PT and OT  Severe protein calorie malnutrition -Patient currently not consuming enough calories daily to sustain his recovery. -Prior to discharge will need to make a decision on PEG tube -Continue to encourage p.o. intake.  Goals of care -4/28 Palliative Care consult; alcoholic pancreatitis, neck cancer s/p laryngectomy in 2017 and neck dissection s/p radiation therapy, liver cirrhosis due to EtOH abuse, Hx prostate cancer.  Eval for change of CODE STATUS to DNR.   DVT prophylaxis: SCD Code Status: Full Family Communication:  Disposition Plan:  1.  Where the patient is from 2.  Anticipated d/c place. 3.  Barriers to d/c severe protein calorie malnutrition and no current way of providing sufficient calories for recovery.   Consultants:  PCCM   Procedures/Significant Events:    I have personally reviewed and interpreted all radiology studies and my findings are as above.  VENTILATOR SETTINGS:    Cultures   Antimicrobials:    Devices    LINES / TUBES:      Continuous Infusions: . sodium chloride 10 mL/hr at 12/28/19 1114  . sodium chloride Stopped (12/31/19 1551)     Objective: Vitals:   01/01/20 1313 01/01/20 1319 01/01/20 1400 01/01/20 1600  BP: 100/66  107/68 118/64  Pulse: 68  66 69  Resp: 18 18    Temp: 97.6 F (36.4 C)     TempSrc: Oral     SpO2:   99% 100%  Weight:       Height:        Intake/Output Summary (Last 24 hours) at 01/01/2020 1839 Last data filed at 01/01/2020 1735 Gross per 24 hour  Intake 1130.18 ml  Output 1100 ml  Net 30.18 ml   Filed Weights   12/29/19 0500 12/30/19 0026 01/01/20 0852  Weight: 59 kg 61.7 kg 68.5 kg    Examination:  General: Alert, follows some commands no acute respiratory distress, cachectic Eyes: negative scleral hemorrhage, negative anisocoria, negative icterus ENT: Negative Runny nose, negative gingival bleeding, Neck:  Negative scars, masses, torticollis, lymphadenopathy, JVD, open trach site (trach removed 4/26 ) Lungs: Clear to auscultation bilaterally without wheezes or crackles Cardiovascular: Regular rate and rhythm without murmur gallop or rub normal S1 and S2 Abdomen: negative abdominal pain, nondistended, positive soft, bowel sounds, no rebound, no ascites, no appreciable mass Extremities: No significant cyanosis, clubbing, or edema bilateral lower extremities Skin: Negative rashes, lesions, ulcers Psychiatric: Unable to assess Central nervous system: Tracks you with his eyes follow some commands.  .     Data Reviewed: Care during the described time interval was provided by me .  I have reviewed this patient's available data, including medical history, events of note, physical examination, and all test results as part of my evaluation.  CBC: Recent Labs  Lab 12/26/19 1820 12/27/19 1034 12/29/19 0350 12/30/19 0440 01/01/20 0602  WBC 6.1 5.4 6.7 6.0 3.7*  NEUTROABS  --  4.7  --   --  2.8  HGB 8.4* 8.5* 7.2* 7.2* 7.7*  HCT 24.5* 24.3* 20.5* 20.9* 22.7*  MCV 90.1 89.3 89.9 91.7 91.2  PLT 34* 27* 32* 51* 71*   Basic Metabolic Panel: Recent Labs  Lab 12/28/19 0448 12/29/19 0350 12/30/19 0440 12/31/19 0459 01/01/20 0602  NA 137 138 138 135 133*  K 3.6 3.4* 3.7 3.9 4.0  CL 102 104 103 101 100  CO2 27 24 25 24 22   GLUCOSE 131* 112* 83 119* 81  BUN 31* 27* 29* 26* 21*  CREATININE 1.00  1.23 0.91 0.78 0.60*  CALCIUM 7.7* 7.8* 8.5* 8.8* 9.0  MG 1.6* 1.9 1.5* 1.6* 1.9  PHOS 2.2* 3.4 5.0* 4.6 4.7*   GFR: Estimated Creatinine Clearance: 89.7 mL/min (A) (by C-G formula based on SCr of 0.6 mg/dL (L)). Liver Function Tests: Recent Labs  Lab 12/26/19 0331  AST 73*  ALT 46*  ALKPHOS 292*  BILITOT 1.3*  PROT 4.3*  ALBUMIN 1.7*   No results for input(s): LIPASE, AMYLASE in the last 168 hours. No results for input(s): AMMONIA in the last 168 hours. Coagulation Profile: No results for input(s): INR, PROTIME in the last 168 hours. Cardiac Enzymes: No results for input(s): CKTOTAL, CKMB, CKMBINDEX, TROPONINI in the last 168 hours. BNP (last 3 results) No results for input(s): PROBNP in the last 8760 hours. HbA1C: No results for input(s): HGBA1C in the last 72 hours. CBG: Recent Labs  Lab 01/01/20 0730 01/01/20 0812 01/01/20 1204 01/01/20 1240 01/01/20 1637  GLUCAP 68* 99 61* 79 76   Lipid Profile: No results for input(s): CHOL, HDL, LDLCALC, TRIG, CHOLHDL, LDLDIRECT in the last 72 hours. Thyroid Function Tests: No results for input(s): TSH, T4TOTAL, FREET4, T3FREE, THYROIDAB in the last 72 hours. Anemia Panel: Recent Labs    12/30/19 0440  FERRITIN 649*  TIBC 137*  IRON 25*   Sepsis Labs: Recent Labs  Lab 12/26/19 0331  LATICACIDVEN 1.7    Recent Results (from the past 240 hour(s))  Urine culture     Status: None   Collection Time: 12/31/2019  6:47 PM   Specimen: In/Out Cath Urine  Result Value Ref Range Status   Specimen Description IN/OUT CATH URINE  Final   Special Requests NONE  Final   Culture   Final    NO GROWTH Performed at [facility], [place] [address] [place], [place] [address]    Report Status 12/23/2019 FINAL  Final  Culture, respiratory (non-expectorated)     Status: None   Collection Time: 12/25/19 11:56 AM   Specimen: Tracheal Aspirate; Respiratory  Result Value Ref Range Status   Specimen Description TRACHEAL ASPIRATE   Final   Special Requests NONE  Final   Gram Stain   Final    FEW WBC PRESENT,BOTH PMN AND MONONUCLEAR FEW GRAM POSITIVE RODS RARE GRAM POSITIVE COCCI IN PAIRS    Culture   Final    FEW Consistent with normal respiratory flora. Performed at [facility], [place] [address], [place], [place] [address]    Report Status 12/27/2019 FINAL  Final         Radiology Studies: No results found.      Scheduled Meds: . chlorhexidine  15 mL Mouth Rinse BID  . Chlorhexidine Gluconate Cloth  6 each Topical Daily  . feeding supplement (ENSURE ENLIVE)  237 mL Oral TID BM  . magnesium oxide  400 mg Per NG tube TID  . mouth rinse  15 mL Mouth Rinse q12n4p  .  multivitamin with minerals  1 tablet Oral Daily  . sodium chloride flush  10-40 mL Intracatheter Q12H  . thiamine  100 mg Per NG tube Daily   Continuous Infusions: . sodium chloride 10 mL/hr at 12/28/19 1114  . sodium chloride Stopped (12/31/19 1551)     LOS: 10 days    Time spent:40 min    Adam Marshall, Geraldo Docker, MD [facility] Pager [phone]  If 7PM-7AM, please contact night-coverage [email or web address] Password [facility] 01/01/2020, 6:39 PM

## 2020-01-01 NOTE — Progress Notes (Addendum)
Unable to get TR on pt after attempts at oral, axillary, and rectal. MD notified.   MD returned page and stated place warm blankets on patient and inform dayshift to recheck TR shortly after shift change and to recheck CBG.  CBG 65 and MD notified via page.  Juice given to patient for CBG.  Will inform dayshift of patient changes.

## 2020-01-01 NOTE — Progress Notes (Signed)
Nutrition Follow-up  DOCUMENTATION CODES:   Severe malnutrition in context of chronic illness  INTERVENTION:   -Ensure Enlive po TID, each supplement provides 350 kcal and 20 grams of protein -MVI with minerals daily -Magic cup TID with meals, each supplement provides 290 kcal and 9 grams of protein -D/c Vital AF 1.2, as pt no longer has feeding access  NUTRITION DIAGNOSIS:   Severe Malnutrition related to chronic illness(SCC of pyrifoam sinus s/p laryngectomy and neck dissection) as evidenced by severe fat depletion, severe muscle depletion.  Ongoing  GOAL:   Patient will meet greater than or equal to 90% of their needs  Unmet  MONITOR:   PO intake, Supplement acceptance, Labs, TF tolerance  REASON FOR ASSESSMENT:   Consult Enteral/tube feeding initiation and management  ASSESSMENT:   59 year old chronically-ill appearing and cachectic male with hx of SCC of the left pyriform sinus in 2017 stage T1N0 s/p RT with recurrence s/p laryngectomy/neck dissection/left PMMF on 06/2017 with tracheal stoma present. Last seen by ENT 02/2019 with no evidence of recurrence and appears lost to follow-up. Past medical history is also significant for prostate cancer s/p RT, alcoholic cirrhosis and hx pancreatitis. Admitted for septic shock secondary to acute pancreatitis vs GI source. Currently protecting airway.  4/27- cortrak tube removed  Reviewed I/O's: +1.4 L x 24 hours and +16.1 L since admission  UOP: 700 ml x 24 hours  Pt resting in bed, currently on trach collar. No family members present at time of visit.   Cortrak removed yesterday, however, intake has been minimal prior to removal. Per review of meal completion yesterday, pt consumed approximately 363 kcals and 10 grams protein, meeting 18% of estimated kcal needs and 9% of estimated protein needs. Per RN, pt with multiple hypoglycemic episodes secondary to poor oral intake. Pt would greatly benefit from addition of oral  nutrition supplements to increase oral intake.   Plan for SNF placement once medically stable.   Labs reviewed: CBGS: 61-79 (inpatient orders for glycemic control are none).   Diet Order:   Diet Order            Diet regular Room service appropriate? Yes; Fluid consistency: Thin  Diet effective now              EDUCATION NEEDS:   No education needs have been identified at this time  Skin:  Skin Assessment: Skin Integrity Issues: Skin Integrity Issues:: Other (Comment) Other: non-pressure wound to scrotum and penis  Last BM:  12/31/19  Height:   Ht Readings from Last 1 Encounters:  12/05/2019 5\' 6"  (1.676 m)    Weight:   Wt Readings from Last 1 Encounters:  01/01/20 68.5 kg    Ideal Body Weight:  59.1 kg  BMI:  Body mass index is 24.37 kg/m.  Estimated Nutritional Needs:   Kcal:  2000-2200  Protein:  110-125 grams  Fluid:  > 2 L    Loistine Chance, RD, LDN, [place] Registered Dietitian II Certified Diabetes Care and Education Specialist Please refer to [facility] for RD and/or RD on-call/weekend/after hours pager

## 2020-01-01 NOTE — Progress Notes (Signed)
Hypoglycemic Event  CBG: 61  Treatment: cup of juice mixed with 2 packs of sugar, and encouraged pt eat lunch.  Symptoms: None  Follow-up CBG: Time: 1230 CBG Result: 79  Possible Reasons for Event: Poor oral intake  Comments/MD notified: followed protocol    Reynaldo Minium

## 2020-01-01 NOTE — Progress Notes (Signed)
Inpatient Rehabilitation-Admissions Coordinator   Met with pt and his niece Ubaldo Glassing at the bedside with pt's son on speakerphone. [facility] discussed rehab program and need for physical support at DC. Per pt and his niece, his roommate would not be able to fulfill the support requirement. Family would like to discuss their options for rehab amongst themselves tonight and will get back to [facility] tomorrow with dispo plans. They are aware that if the patient does not have physical support at DC, he will need SNF placement.   Will follow up once family has made a determination.   Raechel Ache, OTR/L  Rehab Admissions Coordinator  [phone] 01/01/2020 12:57 PM

## 2020-01-02 DIAGNOSIS — C139 Malignant neoplasm of hypopharynx, unspecified: Secondary | ICD-10-CM | POA: Diagnosis not present

## 2020-01-02 DIAGNOSIS — K859 Acute pancreatitis without necrosis or infection, unspecified: Secondary | ICD-10-CM | POA: Diagnosis not present

## 2020-01-02 DIAGNOSIS — R627 Adult failure to thrive: Secondary | ICD-10-CM

## 2020-01-02 DIAGNOSIS — K7030 Alcoholic cirrhosis of liver without ascites: Secondary | ICD-10-CM | POA: Diagnosis not present

## 2020-01-02 DIAGNOSIS — Z515 Encounter for palliative care: Secondary | ICD-10-CM

## 2020-01-02 DIAGNOSIS — Z7189 Other specified counseling: Secondary | ICD-10-CM

## 2020-01-02 DIAGNOSIS — K852 Alcohol induced acute pancreatitis without necrosis or infection: Secondary | ICD-10-CM | POA: Diagnosis not present

## 2020-01-02 DIAGNOSIS — E43 Unspecified severe protein-calorie malnutrition: Secondary | ICD-10-CM | POA: Diagnosis not present

## 2020-01-02 DIAGNOSIS — G9341 Metabolic encephalopathy: Secondary | ICD-10-CM | POA: Diagnosis not present

## 2020-01-02 LAB — CBC WITH DIFFERENTIAL/PLATELET
Abs Immature Granulocytes: 0.29 10*3/uL — ABNORMAL HIGH (ref 0.00–0.07)
Basophils Absolute: 0 10*3/uL (ref 0.0–0.1)
Basophils Relative: 1 %
Eosinophils Absolute: 0 10*3/uL (ref 0.0–0.5)
Eosinophils Relative: 1 %
HCT: 23.6 % — ABNORMAL LOW (ref 39.0–52.0)
Hemoglobin: 7.9 g/dL — ABNORMAL LOW (ref 13.0–17.0)
Immature Granulocytes: 9 %
Lymphocytes Relative: 7 %
Lymphs Abs: 0.2 10*3/uL — ABNORMAL LOW (ref 0.7–4.0)
MCH: 30.7 pg (ref 26.0–34.0)
MCHC: 33.5 g/dL (ref 30.0–36.0)
MCV: 91.8 fL (ref 80.0–100.0)
Monocytes Absolute: 0.3 10*3/uL (ref 0.1–1.0)
Monocytes Relative: 8 %
Neutro Abs: 2.5 10*3/uL (ref 1.7–7.7)
Neutrophils Relative %: 74 %
Platelets: 89 10*3/uL — ABNORMAL LOW (ref 150–400)
RBC: 2.57 MIL/uL — ABNORMAL LOW (ref 4.22–5.81)
RDW: 16.6 % — ABNORMAL HIGH (ref 11.5–15.5)
WBC Morphology: INCREASED
WBC: 3.4 10*3/uL — ABNORMAL LOW (ref 4.0–10.5)
nRBC: 0.6 % — ABNORMAL HIGH (ref 0.0–0.2)

## 2020-01-02 LAB — PHOSPHORUS: Phosphorus: 5.3 mg/dL — ABNORMAL HIGH (ref 2.5–4.6)

## 2020-01-02 LAB — COMPREHENSIVE METABOLIC PANEL
ALT: 114 U/L — ABNORMAL HIGH (ref 0–44)
AST: 102 U/L — ABNORMAL HIGH (ref 15–41)
Albumin: 1.6 g/dL — ABNORMAL LOW (ref 3.5–5.0)
Alkaline Phosphatase: 514 U/L — ABNORMAL HIGH (ref 38–126)
Anion gap: 10 (ref 5–15)
BUN: 19 mg/dL (ref 6–20)
CO2: 24 mmol/L (ref 22–32)
Calcium: 9.1 mg/dL (ref 8.9–10.3)
Chloride: 100 mmol/L (ref 98–111)
Creatinine, Ser: 0.66 mg/dL (ref 0.61–1.24)
GFR calc Af Amer: >60 mL/min (ref >60)
GFR calc non Af Amer: >60 mL/min (ref >60)
Glucose, Bld: 74 mg/dL (ref 70–99)
Potassium: 4.3 mmol/L (ref 3.5–5.1)
Sodium: 134 mmol/L — ABNORMAL LOW (ref 135–145)
Total Bilirubin: 1.2 mg/dL (ref 0.3–1.2)
Total Protein: 4.7 g/dL — ABNORMAL LOW (ref 6.5–8.1)

## 2020-01-02 LAB — GLUCOSE, CAPILLARY
Glucose-Capillary: 101 mg/dL — ABNORMAL HIGH (ref 70–99)
Glucose-Capillary: 127 mg/dL — ABNORMAL HIGH (ref 70–99)
Glucose-Capillary: 46 mg/dL — ABNORMAL LOW (ref 70–99)
Glucose-Capillary: 53 mg/dL — ABNORMAL LOW (ref 70–99)
Glucose-Capillary: 66 mg/dL — ABNORMAL LOW (ref 70–99)
Glucose-Capillary: 66 mg/dL — ABNORMAL LOW (ref 70–99)
Glucose-Capillary: 83 mg/dL (ref 70–99)
Glucose-Capillary: 83 mg/dL (ref 70–99)
Glucose-Capillary: 89 mg/dL (ref 70–99)
Glucose-Capillary: 97 mg/dL (ref 70–99)

## 2020-01-02 LAB — MAGNESIUM: Magnesium: 1.7 mg/dL (ref 1.7–2.4)

## 2020-01-02 MED ORDER — DEXTROSE 50 % IV SOLN
1.0000 | Freq: Once | INTRAVENOUS | Status: AC
  Administered 2020-01-04: 50 mL via INTRAVENOUS
  Filled 2020-01-02: qty 50

## 2020-01-02 MED ORDER — DEXTROSE 50 % IV SOLN
INTRAVENOUS | Status: AC
  Filled 2020-01-02: qty 50

## 2020-01-02 MED ORDER — DEXTROSE 50 % IV SOLN
12.5000 g | INTRAVENOUS | Status: AC
  Administered 2020-01-02: 12.5 g via INTRAVENOUS

## 2020-01-02 NOTE — Progress Notes (Signed)
Inpatient Rehabilitation-Admissions Coordinator   Spoke with pt's niece, Ubaldo Glassing, as follow up from conversation yesterday. At this time, pt will not have the recommended 24/7 support at DC and family will need more time to gather support system to assist him when he comes home; due to time constraints of an IP Rehab admission, AC would recommend SNF placement. Pt's family appear to be in agreement.   AC will communicate recommendation to [facility] team and will sign off.   Raechel Ache, OTR/L  Rehab Admissions Coordinator  [phone] 01/02/2020 12:57 PM

## 2020-01-02 NOTE — Progress Notes (Signed)
PT Cancellation Note  Patient Details Name: Adam Marshall MRN: [id number] DOB: [birth year]   Cancelled Treatment:    Reason Eval/Treat Not Completed: (P) Patient declined, no reason specified Pt shaking head no when asked to get to the recliner, or even just to EoB. Pt keeps pointing to his stomach but when asked if his stomach hurts he shakes his head no. PT will try back tomorrow for treatment.  Willim Turnage B. Migdalia Dk PT, DPT Acute Rehabilitation Services Pager [phone] Office [phone]    [place] 01/02/2020, 3:05 PM

## 2020-01-02 NOTE — Progress Notes (Signed)
Brief Nutrition Follow-Up Note  Reviewed page by RN, who reports that pt often has difficulty consuming foods, as they are too tough for him. She is requesting RD downgrade diet to softer texture of ease of intake. RD downgraded det to dysphagia 3 diet with thin liquids. Pt also with orders for Ensure Enlive po TID (each supplement provides 350 kcal and 20 grams of protein) and Magic cup TID with meals (each supplement provides 290 kcal and 9 grams of protein).   Refer to RD note dated 01/01/20 for further details.   RD will re-evaluate PO intake, diet tolerance, and supplement acceptance at follow-up.   Loistine Chance, RD, LDN, [place] Registered Dietitian II Certified Diabetes Care and Education Specialist Please refer to [facility] for RD and/or RD on-call/weekend/after hours pager

## 2020-01-02 NOTE — Progress Notes (Signed)
PROGRESS NOTE    Adam Marshall  [id number] DOB: May 29, 1961 DOA: 12/15/2019 PCP: [name], FNP     Brief Narrative:  Patient was admitted to the hospital with a working diagnosis ofsepticshock due to acute pancreatitis, complicated by hypothermia, respiratory failure, metabolic encephalopathyand acute kidney injury.  58-year-BM PMHx altered mental status. 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 He also has prostate cancer,andalcoholic cirrhosis.  Patient had several days of altered mentation, EMS was called and patient was found hypotensive,on arrival to the ED his temperature was 83.3, blood pressure 89/50, heart rate 49.He was cachectic and ill looking appearance, positive systolic murmur, his lungs had decreased breath sounds bilaterally, his abdomen was tender in the epigastric region and left upper quadrant, no lower extremity edema.Patient was unable to follow commands but no signs of focality.  Patient was placed on broad-spectrum antibiotic therapy and vasopressors. On April 20 he developed respiratory distress, a Shiley 8 tracheostomy tube was inserted and patient was placed on mechanical ventilation. The following day on April 21 she was transitioned back to trach collar. Patient had an NG tube placed for feedings.  Patient required vasopressors while in the intensive care unit.  Transferred to [facility] on 12/27/19.  Patient with refeeding syndrome, aggressive correction of P, Mg, Ca and K.Patient with decreased po intake, continue Ng tube for supportive feedings and for medications.  Patient is allowed to eat, no risk of aspiration, no clinical signs of esophageal tracheal fistula.Diet changed toregularand will attempt to remove NG tube if maintains po intake.  Lurline Idol was removed on 04/26, with good toleration.      Subjective: 4./29 alert, follows all commands.  Appears to answer questions with nods of his head appropriately however after speaking with RN Rona Ravens patient not competent.   Assessment & Plan:   Principal Problem:   Acute pancreatitis Active Problems:   Chronic pancreatitis (HCC)   Respiratory failure (HCC)   Protein-calorie malnutrition, severe (HCC)   Cancer of hypopharynx (HCC)   Shock ([place])   Acute kidney injury ([place])   Refeeding syndrome   AKI (acute kidney injury) ([place])   Acute metabolic encephalopathy   Alcoholic cirrhosis of liver without ascites ([place])   Severe protein-calorie malnutrition ([place])   Septic shock/acute EtOH pancreatitis (POC) - abdomenwith nopancreaticabscess (04/21). Completed antibiotic therapy with meropenem.Patient with no significant abdominal pain -Continue to encourage p.o. intake see malnutrition,   2. Acute hypoxemic respiratory failure/ Hx of neck cancer sp laryngectomy in 2017 and neck dissection sp radiation therapy.Trach cannula has been removed with good toleration, will continue trach collar and oxymetry monitoring.   Out of bed as tolerated and physical/ occupational therapy.   3. Liver cirrhosis due to alcohol/multifactorial anemia/ anemia of chronic disease/ combined with iron deficiency.Thrombocytopenia.sp 2 units PRBC transfusion since admission. One dose of IV iron. (transferrin saturation down to 18, TIBC 137, iron 25 and ferritin 649).   No signs of bleeding and his cell count has remain stable with Hgb 7 to 8, his platelets have been trending up, last check up to 51. Will plan to check cell count in am.   4. AKI, Hypoglycemia, Hypokalemia, hypophosphatemia, hypocalcemia and hypomagnesemia/ severe calorie protein malnutrition with refeeding syndrome.His electrolytes have improved, with Na at 139, K at 3,9, Cl 101, bicarb 24, P 4,6, Ca 8,8 but Mg continue to be low at 1,6. Glucose has been between 72 and  82.  Will  give 4 g mag sulfate today, and will continue to encourage po intake, follow on electrolytes in am. His renal function stable at 0,78. Continue mag oxide po tid 400 mg.   Will continue low dose IV dextrose for now to prevent worsening hypoglycemia.   5. Penile lesion and Hx of prostate cancer.Local wound care.Plan to remove foley catheter today.  6. Metabolic encephalopathy.Continue withthiamine and multivitamins. Patient following commands and off mittens. Continue neuro checks per unit protocol. PT and OT  Severe protein calorie malnutrition -Patient currently not consuming enough calories daily to sustain his recovery. -4/29 Per RN Rona Ravens believes patient will do well on dysphagia 1 diet, as he has been eating applesauce and puddings. -4/29 dysphagia 1 diet.    Goals of care -4/28 Palliative Care consult; alcoholic pancreatitis, neck cancer s/p laryngectomy in 2017 and neck dissection s/p radiation therapy, liver cirrhosis due to EtOH abuse, Hx prostate cancer.  Eval for change of CODE STATUS to DNR.   DVT prophylaxis: SCD Code Status: Full Family Communication:  Disposition Plan:  1.  Where the patient is from 2.  Anticipated d/c place. 3.  Barriers to d/c severe protein calorie malnutrition and no current way of providing sufficient calories for recovery.   Consultants:  PCCM   Procedures/Significant Events:    I have personally reviewed and interpreted all radiology studies and my findings are as above.  VENTILATOR SETTINGS:    Cultures   Antimicrobials:    Devices    LINES / TUBES:      Continuous Infusions: . sodium chloride 10 mL/hr at 12/28/19 1114  . sodium chloride Stopped (12/31/19 1551)  . dextrose 5 % and 0.9% NaCl 75 mL/hr at 01/02/20 0300     Objective: Vitals:   01/02/20 0459 01/02/20 0600 01/02/20 0758 01/02/20 0910  BP:  (!) 146/90 (!) 143/93 (!) 130/99  Pulse:  78 76 73  Resp:      Temp:      TempSrc:      SpO2:  100%  100% 100%  Weight: 63.4 kg     Height:        Intake/Output Summary (Last 24 hours) at 01/02/2020 0935 Last data filed at 01/02/2020 0459 Gross per 24 hour  Intake 543.83 ml  Output 925 ml  Net -381.17 ml   Filed Weights   12/30/19 0026 01/01/20 0852 01/02/20 0459  Weight: 61.7 kg 68.5 kg 63.4 kg    Examination:  General: Alert, follows some commands no acute respiratory distress, cachectic Eyes: negative scleral hemorrhage, negative anisocoria, negative icterus ENT: Negative Runny nose, negative gingival bleeding, Neck:  Negative scars, masses, torticollis, lymphadenopathy, JVD, open trach site (trach removed 4/26 ) Lungs: Clear to auscultation bilaterally without wheezes or crackles Cardiovascular: Regular rate and rhythm without murmur gallop or rub normal S1 and S2 Abdomen: negative abdominal pain, nondistended, positive soft, bowel sounds, no rebound, no ascites, no appreciable mass Extremities: No significant cyanosis, clubbing, or edema bilateral lower extremities Skin: Negative rashes, lesions, ulcers Psychiatric: Unable to assess Central nervous system: Tracks you with his eyes follow some commands.  .     Data Reviewed: Care during the described time interval was provided by me .  I have reviewed this patient's available data, including medical history, events of note, physical examination, and all test results as part of my evaluation.  CBC: Recent Labs  Lab 12/27/19 1034 12/29/19 0350 12/30/19 0440 01/01/20 0602 01/02/20 0335  WBC 5.4 6.7 6.0 3.7*  3.4*  NEUTROABS 4.7  --   --  2.8 2.5  HGB 8.5* 7.2* 7.2* 7.7* 7.9*  HCT 24.3* 20.5* 20.9* 22.7* 23.6*  MCV 89.3 89.9 91.7 91.2 91.8  PLT 27* 32* 51* 71* 89*   Basic Metabolic Panel: Recent Labs  Lab 12/29/19 0350 12/30/19 0440 12/31/19 0459 01/01/20 0602 01/02/20 0335  NA 138 138 135 133* 134*  K 3.4* 3.7 3.9 4.0 4.3  CL 104 103 101 100 100  CO2 24 25 24 22 24   GLUCOSE 112* 83 119* 81 74  BUN 27*  29* 26* 21* 19  CREATININE 1.23 0.91 0.78 0.60* 0.66  CALCIUM 7.8* 8.5* 8.8* 9.0 9.1  MG 1.9 1.5* 1.6* 1.9 1.7  PHOS 3.4 5.0* 4.6 4.7* 5.3*   GFR: Estimated Creatinine Clearance: 89.2 mL/min (by C-G formula based on SCr of 0.66 mg/dL). Liver Function Tests: Recent Labs  Lab 01/02/20 0335  AST 102*  ALT 114*  ALKPHOS 514*  BILITOT 1.2  PROT 4.7*  ALBUMIN 1.6*   No results for input(s): LIPASE, AMYLASE in the last 168 hours. No results for input(s): AMMONIA in the last 168 hours. Coagulation Profile: No results for input(s): INR, PROTIME in the last 168 hours. Cardiac Enzymes: No results for input(s): CKTOTAL, CKMB, CKMBINDEX, TROPONINI in the last 168 hours. BNP (last 3 results) No results for input(s): PROBNP in the last 8760 hours. HbA1C: No results for input(s): HGBA1C in the last 72 hours. CBG: Recent Labs  Lab 01/02/20 0403 01/02/20 0446 01/02/20 0534 01/02/20 0913 01/02/20 0929  GLUCAP 66* 66* 127* 46* 53*   Lipid Profile: No results for input(s): CHOL, HDL, LDLCALC, TRIG, CHOLHDL, LDLDIRECT in the last 72 hours. Thyroid Function Tests: No results for input(s): TSH, T4TOTAL, FREET4, T3FREE, THYROIDAB in the last 72 hours. Anemia Panel: No results for input(s): VITAMINB12, FOLATE, FERRITIN, TIBC, IRON, RETICCTPCT in the last 72 hours. Sepsis Labs: No results for input(s): PROCALCITON, LATICACIDVEN in the last 168 hours.  Recent Results (from the past 240 hour(s))  Culture, respiratory (non-expectorated)     Status: None   Collection Time: 12/25/19 11:56 AM   Specimen: Tracheal Aspirate; Respiratory  Result Value Ref Range Status   Specimen Description TRACHEAL ASPIRATE  Final   Special Requests NONE  Final   Gram Stain   Final    FEW WBC PRESENT,BOTH PMN AND MONONUCLEAR FEW GRAM POSITIVE RODS RARE GRAM POSITIVE COCCI IN PAIRS    Culture   Final    FEW Consistent with normal respiratory flora. Performed at [facility], [place] [address],  [place], [place] [address]    Report Status 12/27/2019 FINAL  Final         Radiology Studies: No results found.      Scheduled Meds: . chlorhexidine  15 mL Mouth Rinse BID  . Chlorhexidine Gluconate Cloth  6 each Topical Daily  . dextrose      . feeding supplement (ENSURE ENLIVE)  237 mL Oral TID BM  . magnesium oxide  400 mg Per NG tube TID  . mouth rinse  15 mL Mouth Rinse q12n4p  . multivitamin with minerals  1 tablet Oral Daily  . sodium chloride flush  10-40 mL Intracatheter Q12H  . thiamine  100 mg Per NG tube Daily   Continuous Infusions: . sodium chloride 10 mL/hr at 12/28/19 1114  . sodium chloride Stopped (12/31/19 1551)  . dextrose 5 % and 0.9% NaCl 75 mL/hr at 01/02/20 0300     LOS:  11 days    Time spent:40 min    Damiano Stamper, Geraldo Docker, MD [facility] Pager [phone]  If 7PM-7AM, please contact night-coverage [email or web address] Password [facility] 01/02/2020, 9:35 AM

## 2020-01-02 NOTE — Progress Notes (Addendum)
Hypoglycemic Event  CBG: 46  Treatment: cup of juice and 2 packs of sugar  Symptoms: shaking  Follow-up CBG: Time:0929 CBG Result: 53   Gave additional 2 cup of juices and 2 packs of sugar.  Rechecked at: 0949        CBG result: 89  Possible Reasons for Event: Poor Po intake  Comments/MD notified: Dr Clydene Laming  Received order: Dextrose 50%, 50 mL  Dextrose on hold at this time due to BG is controlled now. Will recheck BG at lunch time     Reynaldo Minium

## 2020-01-02 NOTE — TOC Progression Note (Signed)
Transition of Care [facility]) - Progression Note    Patient Details  Name: Adam Marshall MRN: [id number] Date of Birth: Jul 11, 1961  Transition of Care [facility]) CM/SW Contact  Zenon Mayo, RN Phone Number: 01/02/2020, 1:32 PM  Clinical Narrative:    NCM lvm for Kathee Delton at [facility] to see if patient is a candidate for [facility] at [facility].  awaiting call back.        Expected Discharge Plan and Services                                                 Social Determinants of Health (SDOH) Interventions    Readmission Risk Interventions No flowsheet data found.

## 2020-01-02 NOTE — Progress Notes (Signed)
Hypoglycemic Event  CBG: 66  Treatment: 1/2 amp D50  Symptoms: None  Follow-up CBG: Time:0534 CBG Result:127  Possible Reasons for Event: Poor appetite  Comments/MD notified: Followed protocol    Danae Chen A Tacey Dimaggio

## 2020-01-02 NOTE — Progress Notes (Signed)
Hypoglycemic Event  CBG: 66  Treatment: 8oz juice  Symptoms: none  Follow-up CBG: Time:0446 CBG Result:66  Possible Reasons for Event: Poor appetite  Comments/MD notified: Followed protocol    Adam Marshall A Bertina Guthridge

## 2020-01-02 NOTE — Consult Note (Signed)
Consultation Note Date: 01/02/2020   Patient Name: Adam Marshall  DOB: 1961-03-19  MRN: [id number]  Age / Sex: 59 y.o., male  PCP: [name], FNP Referring Physician: Allie Bossier, MD  Reason for Consultation: Establishing goals of care  HPI/Patient Profile: 59 y.o. male  with past medical history of squamous cell carcinoma of the sinus s/p laryngectomy, cirrhosis, ETOH abuse, chronic pancreatitis, and prostate cancer admitted on 12/18/2019 with AMS. We was treated for septic shock d/t acute pancreatitis. Hospital course complicated by respiratory failure - required tracheostomy and mechanical ventilation. Adam Marshall was removed 4/26. Albumin of 1.6. PMT consulted for [place].  Clinical Assessment and Goals of Care: I have reviewed medical records including EPIC notes, labs and imaging, received report from RN and Dr. Sherral Hammers, assessed the patient and then attempted to establish a decision maker to discuss diagnosis prognosis, [place], EOL wishes, disposition and options.  Patient does have a living will and HCPOA listed in ACP documents in EPIC. This was completed in 2017. He listed someone named Adam Marshall as his HCPOA. I attempted to call number listed with no answer and no way to leave a voicemail. I then attempted to call patient's son - no answer, voicemail left. I then called patient's sister and brother - no answer, voicemail left.   I later heard from case manager who provided contact information for patient's niece Adam Marshall [phone]). I was able to get in touch with Adam Marshall. She shares that she along with her mother (patient's sister Adam Marshall) and patient's son make decisions for patient. Adam Marshall tells me she was named Economist in 2011. I ask her about HCPOA documentation from 2017 for Adam Marshall - she tells me this was patient's fiance and she has passed away.   Adam Marshall tells me she has HCPOA documentation however this is not listed in patient's  chart. Next of kin would be son who did not respond to phone call.  With Adam Marshall I discussed concerns about patient's long-term prognosis. Discussed concern of poor prognosis d/t patient's chronic health conditions and frailty. Discussed need to discuss goals of care. Adam Marshall expresses understanding and agrees to meeting. She would like to include her sibling and patient's son in [place] conversation. Meeting set for 3 pm tomorrow (4/30) via phone.  Adam Marshall shares that the patient's son is currently in [place] but should be available for phone conversation. She tells me the patient's sister is currently hospitalized d/t CVA. She does share her desire for patient to go to SNF following hospitalization - she does not feel he is safe to go home as there is no one to provide care he needs.   Primary Decision Maker HCPOA - niece Adam Marshall? Tells me she was named Economist, son also involved  SUMMARY OF RECOMMENDATIONS   - [place] discussion scheduled with son and niece tomorrow (4/30) at 3 PM - niece educated about concern of poor long-term prognosis  Code Status/Advance Care Planning:  Full code  Prognosis:  Poor long term prognosis r/t patient's multiple chronic conditions, decline in function and cognition, malnutrition with albumin of 1.6  Discharge Planning: [place] for rehab with Palliative care service follow-up      Primary Diagnoses: Present on Admission: . Shock ([place]) . Acute pancreatitis . Respiratory failure ([place]) . Chronic pancreatitis ([place]) . Protein-calorie malnutrition, severe ([place]) . Cancer of hypopharynx ([place]) . Acute metabolic encephalopathy . Alcoholic cirrhosis of liver without ascites ([place]) . Severe protein-calorie malnutrition ([place])   I have reviewed the medical  record, interviewed the patient and family, and examined the patient. The following aspects are pertinent.  Past Medical History:  Diagnosis Date  . Abnormal LFTs   . Cancer ([place])    invasive  squamous cell carcinoma of pyriform sinus  . Chronic back pain   . Chronic knee pain    left  . Cirrhosis ([place])   . Fatty liver   . Gout   . Hiatal hernia   . Hypertension   . Pancreatic lesion   . Pancreatitis   . Prostate cancer [facility])    Social History   Socioeconomic History  . Marital status: Widowed    Spouse name: Not on file  . Number of children: 1  . Years of education: 27  . Highest education level: Not on file  Occupational History  . Occupation: Disability    Comment: Knee / Back  Tobacco Use  . Smoking status: Former Smoker    Packs/day: 0.30    Years: 30.00    Pack years: 9.00    Types: Cigarettes    Quit date: 03/19/2016    Years since quitting: 3.7  . Smokeless tobacco: Never Used  Substance and Sexual Activity  . Alcohol use: Yes    Alcohol/week: 120.0 standard drinks    Types: 84 Cans of beer, 36 Shots of liquor per week    Comment: The patient drank a 12 pack of beer a day and 1 pint of liquor every other day. He stopped on 03/14/16 when he was admitted to the hospital.  . Drug use: No    Comment: Hx of cocaine abuse over 30 years ago  . Sexual activity: Not Currently  Other Topics Concern  . Not on file  Social History Narrative   Born and raised in [place], [place].    Fun: Stay home and watch TV.    Denies religious beliefs that would effect health care.    Social Determinants of Health   Financial Resource Strain:   . Difficulty of Paying Living Expenses:   Food Insecurity:   . Worried About Charity fundraiser in the Last Year:   . Arboriculturist in the Last Year:   Transportation Needs:   . Film/video editor (Medical):   Marland Kitchen Lack of Transportation (Non-Medical):   Physical Activity:   . Days of Exercise per Week:   . Minutes of Exercise per Session:   Stress:   . Feeling of Stress :   Social Connections:   . Frequency of Communication with Friends and Family:   . Frequency of Social Gatherings with Friends and Family:   .  Attends Religious Services:   . Active Member of Clubs or Organizations:   . Attends Archivist Meetings:   Marland Kitchen Marital Status:    Family History  Problem Relation Age of Onset  . Diabetes Mother   . Diabetes Maternal Grandmother   . Throat cancer Brother 67  . Kidney disease Sister        Dialysis   Scheduled Meds: . chlorhexidine  15 mL Mouth Rinse BID  . Chlorhexidine Gluconate Cloth  6 each Topical Daily  . dextrose  1 ampule Intravenous Once  . dextrose      . feeding supplement (ENSURE ENLIVE)  237 mL Oral TID BM  . magnesium oxide  400 mg Per NG tube TID  . mouth rinse  15 mL Mouth Rinse q12n4p  . multivitamin with minerals  1 tablet Oral Daily  .  sodium chloride flush  10-40 mL Intracatheter Q12H  . thiamine  100 mg Per NG tube Daily   Continuous Infusions: . sodium chloride 10 mL/hr at 12/28/19 1114  . sodium chloride Stopped (12/31/19 1551)  . dextrose 5 % and 0.9% NaCl 75 mL/hr at 01/02/20 1133   PRN Meds:.Place/Maintain arterial line **AND** sodium chloride, sodium chloride, acetaminophen, Gerhardt's butt cream, oxyCODONE, polyethylene glycol, sodium chloride flush Allergies  Allergen Reactions  . Penicillins Hives and Rash    Has patient had a PCN reaction causing immediate rash, facial/tongue/throat swelling, SOB or lightheadedness with hypotension:Yes Has patient had a PCN reaction causing severe rash involving mucus membranes or skin necrosis:unsure Has patient had a PCN reaction that required hospitalization:No Has patient had a PCN reaction occurring within the last 10 years:No  If all of the above answers are "NO", then may proceed with Cephalosporin use. Has patient had a PCN reaction causing immediate rash, facial/tongue/throat swelling, SOB or lightheadedness with hypotension:Yes Has patient had a PCN reaction causing severe rash involving mucus membranes or skin necrosis:unsure Has patient had a PCN reaction that required hospitalization:No Has  patient had a PCN reaction occurring within the last 10 years:No  If all of the above answers are "NO", then may proceed with Cephalosporin use.    Review of Systems  Unable to perform ROS: Mental status change    Physical Exam Constitutional:      General: He is not in acute distress.    Comments: Thin, frail  Pulmonary:     Effort: Pulmonary effort is normal.     Comments: Trach collar over stoma Musculoskeletal:     Right lower leg: No edema.     Left lower leg: No edema.  Skin:    General: Skin is warm and dry.  Neurological:     Mental Status: He is alert.     Comments: Does not respond appropriately - shakes head to every question     Vital Signs: BP 123/80 (BP Location: Left Leg)   Pulse 78   Temp 98.6 F (37 C) (Axillary)   Resp 16   Ht 5\' 6"  (1.676 m)   Wt 63.4 kg   SpO2 100%   BMI 22.56 kg/m  Pain Scale: 0-10 POSS *See Group Information*: S-Acceptable,Sleep, easy to arouse Pain Score: 0-No pain   SpO2: SpO2: 100 % O2 Device:SpO2: 100 % O2 Flow Rate: .O2 Flow Rate (L/min): 5 L/min  IO: Intake/output summary:   Intake/Output Summary (Last 24 hours) at 01/02/2020 1508 Last data filed at 01/02/2020 0900 Gross per 24 hour  Intake 1023.83 ml  Output 925 ml  Net 98.83 ml    LBM: Last BM Date: 01/02/20 Baseline Weight: Weight: 45.4 kg Most recent weight: Weight: 63.4 kg     Palliative Assessment/Data: PPS 20%    Time Total: 50 minutes Greater than 50%  of this time was spent counseling and coordinating care related to the above assessment and plan.  Juel Burrow, DNP, AGNP-C Palliative Medicine Team [phone] Pager: [phone]

## 2020-01-03 DIAGNOSIS — K7040 Alcoholic hepatic failure without coma: Secondary | ICD-10-CM

## 2020-01-03 DIAGNOSIS — Z7189 Other specified counseling: Secondary | ICD-10-CM | POA: Diagnosis not present

## 2020-01-03 DIAGNOSIS — G9341 Metabolic encephalopathy: Secondary | ICD-10-CM | POA: Diagnosis not present

## 2020-01-03 DIAGNOSIS — K8522 Alcohol induced acute pancreatitis with infected necrosis: Secondary | ICD-10-CM | POA: Diagnosis not present

## 2020-01-03 DIAGNOSIS — R627 Adult failure to thrive: Secondary | ICD-10-CM | POA: Diagnosis not present

## 2020-01-03 LAB — COMPREHENSIVE METABOLIC PANEL
ALT: 146 U/L — ABNORMAL HIGH (ref 0–44)
AST: 126 U/L — ABNORMAL HIGH (ref 15–41)
Albumin: 1.5 g/dL — ABNORMAL LOW (ref 3.5–5.0)
Alkaline Phosphatase: 615 U/L — ABNORMAL HIGH (ref 38–126)
Anion gap: 8 (ref 5–15)
BUN: 15 mg/dL (ref 6–20)
CO2: 25 mmol/L (ref 22–32)
Calcium: 9.2 mg/dL (ref 8.9–10.3)
Chloride: 100 mmol/L (ref 98–111)
Creatinine, Ser: 0.71 mg/dL (ref 0.61–1.24)
GFR calc Af Amer: >60 mL/min (ref >60)
GFR calc non Af Amer: >60 mL/min (ref >60)
Glucose, Bld: 87 mg/dL (ref 70–99)
Potassium: 4.3 mmol/L (ref 3.5–5.1)
Sodium: 133 mmol/L — ABNORMAL LOW (ref 135–145)
Total Bilirubin: 1.4 mg/dL — ABNORMAL HIGH (ref 0.3–1.2)
Total Protein: 4.8 g/dL — ABNORMAL LOW (ref 6.5–8.1)

## 2020-01-03 LAB — PHOSPHORUS: Phosphorus: 5.8 mg/dL — ABNORMAL HIGH (ref 2.5–4.6)

## 2020-01-03 LAB — CBC WITH DIFFERENTIAL/PLATELET
Abs Immature Granulocytes: 0 10*3/uL (ref 0.00–0.07)
Abs Immature Granulocytes: 0.1 10*3/uL — ABNORMAL HIGH (ref 0.00–0.07)
Basophils Absolute: 0.1 10*3/uL (ref 0.0–0.1)
Basophils Absolute: 0.2 10*3/uL — ABNORMAL HIGH (ref 0.0–0.1)
Basophils Relative: 2 %
Basophils Relative: 4 %
Eosinophils Absolute: 0 10*3/uL (ref 0.0–0.5)
Eosinophils Absolute: 0 10*3/uL (ref 0.0–0.5)
Eosinophils Relative: 0 %
Eosinophils Relative: 1 %
HCT: 23.7 % — ABNORMAL LOW (ref 39.0–52.0)
HCT: 23.3 % — ABNORMAL LOW (ref 39.0–52.0)
Hemoglobin: 7.9 g/dL — ABNORMAL LOW (ref 13.0–17.0)
Hemoglobin: 8 g/dL — ABNORMAL LOW (ref 13.0–17.0)
Lymphocytes Relative: 3 %
Lymphocytes Relative: 2 %
Lymphs Abs: 0.1 10*3/uL — ABNORMAL LOW (ref 0.7–4.0)
Lymphs Abs: 0.1 10*3/uL — ABNORMAL LOW (ref 0.7–4.0)
MCH: 30.9 pg (ref 26.0–34.0)
MCH: 31 pg (ref 26.0–34.0)
MCHC: 33.3 g/dL (ref 30.0–36.0)
MCHC: 34.3 g/dL (ref 30.0–36.0)
MCV: 92.6 fL (ref 80.0–100.0)
MCV: 90.3 fL (ref 80.0–100.0)
Monocytes Absolute: 0.2 10*3/uL (ref 0.1–1.0)
Monocytes Absolute: 0.1 10*3/uL (ref 0.1–1.0)
Monocytes Relative: 4 %
Monocytes Relative: 3 %
Myelocytes: 1 %
Neutro Abs: 4 10*3/uL (ref 1.7–7.7)
Neutro Abs: 4.3 10*3/uL (ref 1.7–7.7)
Neutrophils Relative %: 91 %
Neutrophils Relative %: 88 %
Platelets: 126 10*3/uL — ABNORMAL LOW (ref 150–400)
Platelets: 124 10*3/uL — ABNORMAL LOW (ref 150–400)
Promyelocytes Relative: 1 %
RBC: 2.56 MIL/uL — ABNORMAL LOW (ref 4.22–5.81)
RBC: 2.58 MIL/uL — ABNORMAL LOW (ref 4.22–5.81)
RDW: 16.8 % — ABNORMAL HIGH (ref 11.5–15.5)
RDW: 16.5 % — ABNORMAL HIGH (ref 11.5–15.5)
WBC: 4.4 10*3/uL (ref 4.0–10.5)
WBC: 4.9 10*3/uL (ref 4.0–10.5)
nRBC: 0.9 % — ABNORMAL HIGH (ref 0.0–0.2)
nRBC: 1 /100 WBC — ABNORMAL HIGH
nRBC: 0.8 % — ABNORMAL HIGH (ref 0.0–0.2)
nRBC: 2 /100 WBC — ABNORMAL HIGH

## 2020-01-03 LAB — MAGNESIUM: Magnesium: 1.5 mg/dL — ABNORMAL LOW (ref 1.7–2.4)

## 2020-01-03 LAB — GLUCOSE, CAPILLARY
Glucose-Capillary: 61 mg/dL — ABNORMAL LOW (ref 70–99)
Glucose-Capillary: 69 mg/dL — ABNORMAL LOW (ref 70–99)
Glucose-Capillary: 70 mg/dL (ref 70–99)
Glucose-Capillary: 73 mg/dL (ref 70–99)
Glucose-Capillary: 83 mg/dL (ref 70–99)
Glucose-Capillary: 90 mg/dL (ref 70–99)

## 2020-01-03 MED ORDER — MAGNESIUM SULFATE 50 % IJ SOLN
3.0000 g | Freq: Once | INTRAVENOUS | Status: AC
  Administered 2020-01-03: 3 g via INTRAVENOUS
  Filled 2020-01-03: qty 6

## 2020-01-03 MED ORDER — MAGNESIUM OXIDE 400 (241.3 MG) MG PO TABS
400.0000 mg | ORAL_TABLET | Freq: Three times a day (TID) | ORAL | Status: DC
  Administered 2020-01-03: 09:00:00 400 mg via ORAL
  Filled 2020-01-03: qty 1

## 2020-01-03 MED ORDER — THIAMINE HCL 100 MG PO TABS
100.0000 mg | ORAL_TABLET | Freq: Every day | ORAL | Status: DC
  Administered 2020-01-03: 100 mg via ORAL
  Filled 2020-01-03 (×3): qty 1

## 2020-01-03 NOTE — Progress Notes (Signed)
PROGRESS NOTE    Adam Marshall  [id number] DOB: 07-21-1961 DOA: 12/06/2019 PCP: [name], FNP     Brief Narrative:  Patient was admitted to the hospital with a working diagnosis ofsepticshock due to acute pancreatitis, complicated by hypothermia, respiratory failure, metabolic encephalopathyand acute kidney injury.  58-year-BM PMHx altered mental status. 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 He also has prostate cancer,andalcoholic cirrhosis.  Patient had several days of altered mentation, EMS was called and patient was found hypotensive,on arrival to the ED his temperature was 83.3, blood pressure 89/50, heart rate 49.He was cachectic and ill looking appearance, positive systolic murmur, his lungs had decreased breath sounds bilaterally, his abdomen was tender in the epigastric region and left upper quadrant, no lower extremity edema.Patient was unable to follow commands but no signs of focality.  Patient was placed on broad-spectrum antibiotic therapy and vasopressors. On April 20 he developed respiratory distress, a Shiley 8 tracheostomy tube was inserted and patient was placed on mechanical ventilation. The following day on April 21 she was transitioned back to trach collar. Patient had an NG tube placed for feedings.  Patient required vasopressors while in the intensive care unit.  Transferred to [facility] on 12/27/19.  Patient with refeeding syndrome, aggressive correction of P, Mg, Ca and K.Patient with decreased po intake, continue Ng tube for supportive feedings and for medications.  Patient is allowed to eat, no risk of aspiration, no clinical signs of esophageal tracheal fistula.Diet changed toregularand will attempt to remove NG tube if maintains po intake.  Lurline Idol was removed on 04/26, with good toleration.      Subjective: 4/30 alert, follows some commands, at times will let you examine him at other times is irritated and will not..   Assessment & Plan:   Principal Problem:   Acute pancreatitis Active Problems:   Chronic pancreatitis (HCC)   Respiratory failure (HCC)   Protein-calorie malnutrition, severe (HCC)   Cancer of hypopharynx (HCC)   Shock ([place])   Acute kidney injury ([place])   Refeeding syndrome   AKI (acute kidney injury) ([place])   Acute metabolic encephalopathy   Alcoholic cirrhosis of liver without ascites ([place])   Severe protein-calorie malnutrition ([place])   Adult failure to thrive   Goals of care, counseling/discussion   Palliative care by specialist   Septic shock/acute EtOH pancreatitis (POC) - abdomenwith nopancreaticabscess (04/21). Completed antibiotic therapy with meropenem.Patient with no significant abdominal pain -Continue to encourage p.o. intake see malnutrition,   Acute respiratory failure with hypoxia/Neck cancer  -s/p laryngectomy 2017 + neck dissection + XRT . -Trach cannula has been removed with good toleration, will continue trach collar and oxymetry monitoring.   Out of bed as tolerated and physical/ occupational therapy.   -EtOH liver cirrhosis No results for input(s): INR in the last 168 hours.] -Liver enzymes all elevated but relatively stable.  We will continue to monitor closely -Albumin= 1.6  Anemia multifactorial (chronic disease + iron deficiency) -S/p 1 dose IV iron -4/19 transfuse 1 unit PRBC -4/22 transfuse 1 unit PRBC -Negative signs of overt bleeding Recent Labs  Lab 01/01/20 0602 01/02/20 0335 01/03/20 0451 01/03/20 1015 01/04/20 0624  HGB 7.7* 7.9* 7.9* 8.0* 8.2*  -Stable; transfuse for hemoglobin<7   Thrombocytopenia -4/22 transfuse 1 unit platelet pheresis  AKI Recent Labs  Lab 12/31/19 0459 01/01/20 0602 01/02/20 0335 01/03/20 0451 01/04/20 0624  CREATININE 0.78 0.60* 0.66  0.71 0.77   -Resolved   Penile lesion and Hx of prostate cancer. -Local wound care.Plan to remove foley catheter today.  Metabolic encephalopathy. -Continue withthiamine and multivitamins. -Patient follows some commands but  waxes and wanes.   Severe protein calorie malnutrition -Patient currently not consuming enough calories daily to sustain his recovery. -4/29 Per RN Rona Ravens believes patient will do well on dysphagia 1 diet, as he has been eating applesauce and puddings. -4/29 dysphagia 1 diet.  Hypomagnesmia -Magnesium goal> 2 -Magnesium IV 3 g    Goals of care -4/28 Palliative Care consult; alcoholic pancreatitis, neck cancer s/p laryngectomy in 2017 and neck dissection s/p radiation therapy, liver cirrhosis due to EtOH abuse, Hx prostate cancer.  Eval for change of CODE STATUS to DNR. -Per palliative care note 4/30 family is waiting to receive patient's living will and will discuss CODE STATUS.  Family is in line with moving patient to facility, see full note.   DVT prophylaxis: SCD Code Status: Full Family Communication:  Disposition Plan:  1.  Where the patient is from 2.  Anticipated d/c place. 3.  Barriers to d/c severe protein calorie malnutrition and no current way of providing sufficient calories for recovery.   Consultants:  PCCM   Procedures/Significant Events:    I have personally reviewed and interpreted all radiology studies and my findings are as above.  VENTILATOR SETTINGS:    Cultures   Antimicrobials:    Devices    LINES / TUBES:      Continuous Infusions: . sodium chloride 10 mL/hr at 12/28/19 1114  . sodium chloride Stopped (12/31/19 1551)  . dextrose 5 % and 0.9% NaCl 75 mL/hr at 01/03/20 0133     Objective: Vitals:   01/03/20 0307 01/03/20 0444 01/03/20 0753 01/03/20 0800  BP:  140/81 137/87 134/79  Pulse:  94 97   Resp: 16  18   Temp:  (!) 95.1 F (35.1 C) (!) 97.4 F (36.3 C)   TempSrc:  Axillary Oral   SpO2:  98%  96%   Weight:  67.4 kg    Height:        Intake/Output Summary (Last 24 hours) at 01/03/2020 0959 Last data filed at 01/03/2020 0944 Gross per 24 hour  Intake 2269.61 ml  Output 1075 ml  Net 1194.61 ml   Filed Weights   01/01/20 0852 01/02/20 0459 01/03/20 0444  Weight: 68.5 kg 63.4 kg 67.4 kg    Examination:  General: Alert, follows some commands no acute respiratory distress, cachectic Eyes: negative scleral hemorrhage, negative anisocoria, negative icterus ENT: Negative Runny nose, negative gingival bleeding, Neck:  Negative scars, masses, torticollis, lymphadenopathy, JVD, open trach site (trach removed 4/26 ) Lungs: Clear to auscultation bilaterally without wheezes or crackles Cardiovascular: Regular rate and rhythm without murmur gallop or rub normal S1 and S2 Abdomen: negative abdominal pain, nondistended, positive soft, bowel sounds, no rebound, no ascites, no appreciable mass Extremities: No significant cyanosis, clubbing, or edema bilateral lower extremities Skin: Negative rashes, lesions, ulcers Psychiatric: Unable to assess Central nervous system: Tracks you with his eyes follow some commands.  .     Data Reviewed: Care during the described time interval was provided by me .  I have reviewed this patient's available data, including medical history, events of note, physical examination, and all test results as part of my evaluation.  CBC: Recent Labs  Lab 12/27/19 1034 12/29/19 0350 12/30/19 0440 01/01/20 0602 01/02/20 0335  WBC 5.4 6.7 6.0 3.7* 3.4*  NEUTROABS 4.7  --   --  2.8 2.5  HGB 8.5* 7.2* 7.2* 7.7* 7.9*  HCT 24.3* 20.5* 20.9* 22.7* 23.6*  MCV 89.3 89.9 91.7 91.2 91.8  PLT 27* 32* 51* 71* 89*   Basic Metabolic Panel: Recent Labs  Lab 12/30/19 0440 12/31/19 0459 01/01/20 0602 01/02/20 0335 01/03/20 0451  NA 138 135 133* 134* 133*  K 3.7 3.9 4.0 4.3 4.3  CL 103 101 100 100 100  CO2 25 24 22 24 25   GLUCOSE 83 119* 81 74 87  BUN 29* 26*  21* 19 15  CREATININE 0.91 0.78 0.60* 0.66 0.71  CALCIUM 8.5* 8.8* 9.0 9.1 9.2  MG 1.5* 1.6* 1.9 1.7 1.5*  PHOS 5.0* 4.6 4.7* 5.3* 5.8*   GFR: Estimated Creatinine Clearance: 89.7 mL/min (by C-G formula based on SCr of 0.71 mg/dL). Liver Function Tests: Recent Labs  Lab 01/02/20 0335 01/03/20 0451  AST 102* 126*  ALT 114* 146*  ALKPHOS 514* 615*  BILITOT 1.2 1.4*  PROT 4.7* 4.8*  ALBUMIN 1.6* 1.5*   No results for input(s): LIPASE, AMYLASE in the last 168 hours. No results for input(s): AMMONIA in the last 168 hours. Coagulation Profile: No results for input(s): INR, PROTIME in the last 168 hours. Cardiac Enzymes: No results for input(s): CKTOTAL, CKMB, CKMBINDEX, TROPONINI in the last 168 hours. BNP (last 3 results) No results for input(s): PROBNP in the last 8760 hours. HbA1C: No results for input(s): HGBA1C in the last 72 hours. CBG: Recent Labs  Lab 01/02/20 2038 01/03/20 0019 01/03/20 0442 01/03/20 0912 01/03/20 0956  GLUCAP 83 73 83 69* 90   Lipid Profile: No results for input(s): CHOL, HDL, LDLCALC, TRIG, CHOLHDL, LDLDIRECT in the last 72 hours. Thyroid Function Tests: No results for input(s): TSH, T4TOTAL, FREET4, T3FREE, THYROIDAB in the last 72 hours. Anemia Panel: No results for input(s): VITAMINB12, FOLATE, FERRITIN, TIBC, IRON, RETICCTPCT in the last 72 hours. Sepsis Labs: No results for input(s): PROCALCITON, LATICACIDVEN in the last 168 hours.  Recent Results (from the past 240 hour(s))  Culture, respiratory (non-expectorated)     Status: None   Collection Time: 12/25/19 11:56 AM   Specimen: Tracheal Aspirate; Respiratory  Result Value Ref Range Status   Specimen Description TRACHEAL ASPIRATE  Final   Special Requests NONE  Final   Gram Stain   Final    FEW WBC PRESENT,BOTH PMN AND MONONUCLEAR FEW GRAM POSITIVE RODS RARE GRAM POSITIVE COCCI IN PAIRS    Culture   Final    FEW Consistent with normal respiratory flora. Performed at [facility], [place] [address] [place], [place] [address]    Report Status 12/27/2019 FINAL  Final         Radiology Studies: No results found.      Scheduled Meds: . chlorhexidine  15 mL Mouth Rinse BID  . Chlorhexidine Gluconate Cloth  6 each Topical Daily  . dextrose  1 ampule Intravenous Once  . feeding supplement (ENSURE ENLIVE)  237 mL Oral TID BM  . magnesium oxide  400 mg Oral TID  . mouth rinse  15 mL Mouth Rinse q12n4p  . multivitamin with minerals  1 tablet Oral Daily  . sodium chloride flush  10-40 mL Intracatheter Q12H  . thiamine  100 mg Oral Daily   Continuous Infusions: . sodium chloride 10 mL/hr at 12/28/19 1114  . sodium chloride Stopped (12/31/19 1551)  . dextrose 5 % and 0.9% NaCl 75 mL/hr at 01/03/20 0133     LOS: 12 days  Time spent:40 min    Keiran Gaffey, Geraldo Docker, MD [facility] Pager [phone]  If 7PM-7AM, please contact night-coverage [email or web address] Password [email or web address] 01/03/2020, 9:59 AM

## 2020-01-03 NOTE — Progress Notes (Signed)
RT called to assess patient's need for aerosol trach collar. Patient currently on room air, maintaining saturating of mid 90's. Vital signs are stable, patient does not seem to be in any distress. No need for trach collar at this moment. RT will continue to assess.

## 2020-01-03 NOTE — TOC Progression Note (Signed)
Transition of Care [facility]) - Progression Note    Patient Details  Name: Adam Marshall MRN: [id number] Date of Birth: 04-10-1961  Transition of Care [facility]) CM/SW [place], [place] Phone Number: 01/03/2020, 12:15 PM  Clinical Narrative:      CSW sent referral in hub to [facility] to review and messaged admissions to please review and let CSW know if they can make a bed offer, as patient has family currently at [place].   Pending bed offer at this time.       Expected Discharge Plan and Services                                                 Social Determinants of Health (SDOH) Interventions    Readmission Risk Interventions No flowsheet data found.

## 2020-01-03 NOTE — Progress Notes (Signed)
Daily Progress Note   Patient Name: Adam Marshall       Date: 01/03/2020 DOB: 17-Aug-1961  Age: 59 y.o. MRN#: [id number] Attending Physician: Allie Bossier, MD Primary Care Physician: [name], FNP Admit Date: 12/20/2019  Reason for Consultation/Follow-up: Establishing goals of care  Subjective: Patient does not follow commands or respond to me - per RN follows commands for her occasionally. Ate all of breakfast and some lunch per NT.  Length of Stay: 12  Current Medications: Scheduled Meds:  . chlorhexidine  15 mL Mouth Rinse BID  . Chlorhexidine Gluconate Cloth  6 each Topical Daily  . dextrose  1 ampule Intravenous Once  . feeding supplement (ENSURE ENLIVE)  237 mL Oral TID BM  . mouth rinse  15 mL Mouth Rinse q12n4p  . multivitamin with minerals  1 tablet Oral Daily  . sodium chloride flush  10-40 mL Intracatheter Q12H  . thiamine  100 mg Oral Daily    Continuous Infusions: . sodium chloride 10 mL/hr at 12/28/19 1114  . sodium chloride 250 mL (01/03/20 1158)  . dextrose 5 % and 0.9% NaCl Stopped (01/03/20 1156)    PRN Meds: Place/Maintain arterial line **AND** sodium chloride, sodium chloride, acetaminophen, Gerhardt's butt cream, oxyCODONE, polyethylene glycol, sodium chloride flush  Physical Exam Constitutional:      General: He is not in acute distress.    Comments: thin  Pulmonary:     Effort: Pulmonary effort is normal. No respiratory distress.     Comments: Stoma with trach collar Musculoskeletal:     Right lower leg: No edema.     Left lower leg: No edema.  Skin:    General: Skin is warm and dry.  Neurological:     Mental Status: He is alert.     Comments: UTA orientation, does not follow commands             Vital Signs: BP 134/79   Pulse 95   Temp  (!) 97.4 F (36.3 C) (Oral)   Resp 20   Ht 5\' 6"  (1.676 m)   Wt 67.4 kg   SpO2 96%   BMI 23.98 kg/m  SpO2: SpO2: 96 % O2 Device: O2 Device: (S) Nasal Cannula(pt taken off trach collar as stoma is closing) O2 Flow Rate: O2 Flow Rate (L/min): 3 L/min  Intake/output summary:   Intake/Output Summary (Last 24 hours) at 01/03/2020 1452 Last data filed at 01/03/2020 0944 Gross per 24 hour  Intake 2269.61 ml  Output 1075 ml  Net 1194.61 ml   LBM: Last BM Date: 01/02/20 Baseline Weight: Weight: 45.4 kg Most recent weight: Weight: 67.4 kg       Palliative Assessment/Data: PPS 40%    Flowsheet Rows     Most Recent Value  Intake Tab  Referral Department  Hospitalist  Unit at Time of Referral  Intermediate Care Unit  Palliative Care Primary Diagnosis  Sepsis/Infectious Disease  Date Notified  01/01/20  Palliative Care Type  New Palliative care  Reason for referral  Clarify Goals of Care  Date of Admission  12/24/2019  Date first seen by Palliative Care  01/02/20  # of days Palliative referral response time  1 Day(s)  #  of days IP prior to Palliative referral  10  Clinical Assessment  Palliative Performance Scale Score  20%  Psychosocial & Spiritual Assessment  Palliative Care Outcomes  Patient/Family meeting held?  Yes      Patient Active Problem List   Diagnosis Date Noted  . Adult failure to thrive   . Goals of care, counseling/discussion   . Palliative care by specialist   . Severe protein-calorie malnutrition ([place]) 01/01/2020  . Refeeding syndrome 12/29/2019  . AKI (acute kidney injury) ([place]) 12/29/2019  . Other cirrhosis of liver ([place]) 12/29/2019  . Acute metabolic encephalopathy [id number]  . Alcoholic cirrhosis of liver without ascites ([place]) 12/29/2019  . Acute kidney injury ([place])   . Acute pancreatitis   . Shock ([place]) 12/21/2019  . Ear pain, left 02/27/2017  . Encounter for Medicare annual wellness exam 02/27/2017  . Sleep disturbance 10/27/2016  . Malignant  neoplasm of prostate ([place]) 06/24/2016  . Cancer of hypopharynx ([place]) 03/30/2016  . Weakness 03/26/2016  . Gout 03/26/2016  . 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 HCAP (healthcare-associated pneumonia) 03/23/2016  . Hypokalemia 03/23/2016  . Hypomagnesemia 03/23/2016  . Protein-calorie malnutrition, severe ([place]) 03/23/2016  . Reflux esophagitis   . Pancreatic lesion   . Normochromic normocytic anemia   . Pyriform sinus mass   . Ascites   . Cirrhosis of liver with ascites ([place]) 03/14/2016  . Anemia due to chronic blood loss 03/14/2016  . Respiratory failure ([place]) 03/14/2016  . Lesion of pancreas   . Generalized abdominal pain   . Hyponatremia   . Abscess of skin of abdomen 01/13/2016  . Leg cramping 01/13/2016  . Abnormal LFTs (liver function tests) 10/13/2015  . Alcohol use 10/13/2015  . Left knee pain 02/04/2015  . Encounter for general adult medical examination with abnormal findings 12/25/2014  . Medicare annual wellness visit, subsequent 12/25/2014  . Knee pain 12/08/2014  . Chronic pancreatitis ([place]) 05/03/2011  . Rhinorrhea 04/24/2011  . PREMATURE VENTRICULAR CONTRACTIONS 07/07/2010  . HYPERKALEMIA 06/29/2010  . ACUTE GOUTY ARTHROPATHY 06/28/2010  . HYPERTENSION, BENIGN ESSENTIAL 06/28/2010    Palliative Care Assessment & Plan   HPI: 59 y.o. male  with past medical history of squamous cell carcinoma of the sinus s/p laryngectomy, cirrhosis, ETOH abuse, chronic pancreatitis, and prostate cancer admitted on 12/16/2019 with AMS. We was treated for septic shock d/t acute pancreatitis. Hospital course complicated by respiratory failure - required tracheostomy and mechanical ventilation. Lurline Idol was removed 4/26. Albumin of 1.6. PMT consulted for [place]  Assessment: Phone conference with patient's family to include: son Adam Marshall, niece Adam Marshall, and niece Adam Marshall.  I introduced Palliative Medicine as specialized medical care for  people living with serious illness. It focuses on providing relief from the symptoms and stress of a serious illness. The goal is to improve quality of life for both the patient and the family.  We discussed a brief life review of the patient.  Son tells me he hasn't seen patient in a while and lives in [place]. He is unsure of his condition. Tells me every time he speaks to him over the phone patient tells him he is doing well. Nieces tell me he had been weak recently - had not been outside in 3 months. They describe him as strong willed. They tell me they think he had a good appetite, but unsure.    We discussed patient's current illness and what it means in the larger context of patient's on-going co-morbidities.  Natural disease trajectory and expectations at EOL were discussed. Discussed hospital events - treatment for sepsis, respiratory failure with mechanical ventilation. Discussed patient's ongoing AMS and poor nutrition. Discussed albumin of 1.6. Discussed poor function - unable to get out of bed with PT. Discussed overall frailty and concern of continued decline.   Family tells me they never spoke with patient about his wishes if he were very ill. We discussed that patient does have a living will on file and we reviewed what his living will states.   Family asks about prognosis - we discuss that he does not appear to be actively dying but is at high risk for acute events. We discussed concern about continued decline d/t conditions discussed above.   Family asks about code status and ask if his living will indicates DNR status - we discuss that living will is not specific to code status. We discussed a recommendation of DNR status d/t patient's frailty and chronic health conditions. Discussed poor outcomes expected with resuscitative interventions in Mr. Stirn's condition.   Son shares he does not feel like he can make a decision about code status and feels very strongly that Mr. Favret is  the only person that can make this decision. We discussed concern that Mr. Krahmer is unable to make decisions for himself currently and therefore we depend on his family he knows him best to make decisions for him based off previously stated wishes. Son shares further that he would want all interventions to prolong Mr. Chaudhari's life for as long as possible but does not know if that is in line with Mr. Nebergall's wishes.   Family requests that I email them patient's living will so that they can have time to consider Mr. Erman's wishes. His niece Ubaldo Glassing also plans to come to the hospital later and plans to attempt to have conversation with Mr. Orris about his wishes.   We discussed plan of facility placement and family is agreeable to this.   Questions and concerns were addressed. The family was encouraged to call with questions or concerns.   Recommendations/Plan:  Family considering code status - DNR was recommended - son would want full code but wants to honor patient's wishes - living will emailed to family - they will consider and reach back out when they have come to a decision - they have PMT phone number   Code Status:  Full code  Prognosis:   Unable to determine  Discharge Planning:  To Be Determined - LTACH?  Care plan was discussed with son, 2 nieces  Thank you for allowing the Palliative Medicine Team to assist in the care of this patient.   Total Time 65 minutes Prolonged Time Billed  yes      Greater than 50%  of this time was spent counseling and coordinating care related to the above assessment and plan.  Juel Burrow, DNP, [facility] Palliative Medicine Team Team Phone # [phone]  Pager [phone]

## 2020-01-03 NOTE — Progress Notes (Signed)
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 Admitted to ICU for septic shock secondary to possible GI etiology in the setting of recent nausea and vomiting. Patient unable to provide history. Family unable to provide history. Chart reviewed.    PT Comments    Pt with 50% command follow today and is able to initiate some movement/exercise but can not maintain or follow through. With total A pt able to come to EoB but exhibits increased posterior push and is visibly anxious when sitting EoB only able to sit EOB for ~5 min before requiring total A to return to bed. Pt bed placed in chair position and pt appears much more comfortable. Pt with limited participation in LE exercise, occasionally able to initiate movement but mainly AAROM/PROM. PT recommending LTACH level rehab at d/c. PT will follow acutely.   Follow Up Recommendations  LTACH     Equipment Recommendations  Other (comment)(TBA)       Precautions / Restrictions Precautions Precautions: Fall Restrictions Weight Bearing Restrictions: No    Mobility  Bed Mobility Overal bed mobility: Needs Assistance Bed Mobility: Supine to Sit;Sit to Supine     Supine to sit: +2 for physical assistance;Total assist Sit to supine: +2 for physical assistance;Total assist   General bed mobility comments: Total A for management of LE, trunk and pad scoot of   Transfers                 General transfer comment: pt refused with increased anxiety and posterior lean      Balance Overall balance assessment: Needs assistance Sitting-balance support: Feet  supported;Bilateral upper extremity supported Sitting balance-Leahy Scale: Poor       Standing balance-Leahy Scale: Zero                              Cognition Arousal/Alertness: Awake/alert Behavior During Therapy: WFL for tasks assessed/performed;Flat affect Overall Cognitive Status: No family/caregiver present to determine baseline cognitive functioning                                 General Comments: pt only able to follow 50% of commands and unable to maintain activity       Exercises General Exercises - Lower Extremity Ankle Circles/Pumps: 20 reps;PROM;AAROM;Supine Long Arc Quad: PROM;Both;10 reps;Supine Hip ABduction/ADduction: PROM;5 reps;Supine    General Comments General comments (skin integrity, edema, etc.): Pt with difficulty following command       Pertinent Vitals/Pain Pain Assessment: Faces Faces Pain Scale: Hurts little more Pain Location: generalized Pain Descriptors / Indicators: Grimacing;Guarding Pain Intervention(s): Limited activity within patient's tolerance;Monitored during session;Repositioned    Home Living Family/patient expects to be discharged to:: Unsure               Additional Comments: pt supposedly has a room mate.  Unable to determine any other information about the living situation    Prior Function        Comments: pt unable to give reliable information on PLOF.   PT Goals (current goals can now be found in  the care plan section) Acute Rehab PT Goals Patient Stated Goal: pt unable PT Goal Formulation: Patient unable to participate in goal setting Time For Goal Achievement: 01/10/20 Potential to Achieve Goals: Fair Progress towards PT goals: Not progressing toward goals - comment(pt with increased anxiety today)    Frequency    Min 3X/week      PT Plan Discharge plan needs to be updated       AM-PAC PT "6 Clicks" Mobility   Outcome Measure  Help needed turning from your back to your  side while in a flat bed without using bedrails?: Total Help needed moving from lying on your back to sitting on the side of a flat bed without using bedrails?: Total Help needed moving to and from a bed to a chair (including a wheelchair)?: Total Help needed standing up from a chair using your arms (e.g., wheelchair or bedside chair)?: Total Help needed to walk in hospital room?: Total Help needed climbing 3-5 steps with a railing? : Total 6 Click Score: 6    End of Session Equipment Utilized During Treatment: Oxygen Activity Tolerance: Patient tolerated treatment well;Patient limited by fatigue Patient left: in chair;with call bell/phone within reach;with chair alarm set Nurse Communication: Mobility status PT Visit Diagnosis: Muscle weakness (generalized) (M62.81);Other abnormalities of gait and mobility (R26.89);Difficulty in walking, not elsewhere classified (R26.2)     Time: [id number] PT Time Calculation (min) (ACUTE ONLY): 25 min  Charges:  $Therapeutic Exercise: 8-22 mins                     Tannon Peerson B. Migdalia Dk PT, DPT Acute Rehabilitation Services Pager [phone] Office [phone]    [place] 01/03/2020, 11:59 AM

## 2020-01-03 NOTE — Progress Notes (Signed)
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 Admitted to ICU for septic shock secondary to possible GI etiology in the setting of recent nausea and vomiting. Patient unable to provide history. Family unable to provide history. Chart reviewed.   OT comments  Pt in bed with eyes closed upon arrival. Pt following 50% of commands today and is able to initiate some movement , simple grooming but cannot maintain or continue task to completion. Total A + 2 to sit EOB but with increased posterior push and is visibly anxious when sitting EOB only able to sit EOB for ~5 min before requiring total A to return to bed. Pt bed placed in chair position and pt appears much more comfortable.  Follow Up Recommendations  LTACH    Equipment Recommendations  Other (comment)(TBD at next venue of care)    Recommendations for Other Services      Precautions / Restrictions Precautions Precautions: Fall Precaution Comments: trach collar Restrictions Weight Bearing Restrictions: No       Mobility Bed Mobility Overal bed mobility: Needs Assistance Bed Mobility: Supine to Sit;Sit to Supine     Supine to sit: +2 for physical assistance;Total assist Sit to supine: +2 for physical assistance;Total assist   General bed mobility comments: Total A for management of LE, trunk and pad scoot of   Transfers                 General transfer comment: pt refused with increased anxiety and posterior lean    Balance Overall balance assessment: Needs assistance Sitting-balance support: Feet supported;Bilateral upper extremity  supported Sitting balance-Leahy Scale: Poor       Standing balance-Leahy Scale: Zero                             ADL either performed or assessed with clinical judgement   ADL Overall ADL's : Needs assistance/impaired Eating/Feeding: NPO   Grooming: Sitting;Wash/dry hands;Wash/dry face;Bed level;Minimal assistance Grooming Details (indicate cue type and reason): min A hand over hand, pt resistive to initiating and completing activity Upper Body Bathing: Maximal assistance;Sitting;Bed level                             General ADL Comments: Pt limited by decreased activity tolerance, decreased strength, decreased ability to problem solve, and decreased mobility. Total A + 2 to sit EOB, resistive to sitting upright and pushing posteriorly against therapists assist to sit up at EOB     Vision       Perception     Praxis      Cognition Arousal/Alertness: Awake/alert Behavior During Therapy: [facility] for tasks assessed/performed;Flat affect Overall Cognitive Status: No family/caregiver present to determine baseline cognitive functioning                                 General Comments: pt only able to follow 50% of commands and unable to maintain activity         Exercises Exercises: General Lower Extremity General Exercises - Lower  Extremity Ankle Circles/Pumps: 20 reps;PROM;AAROM;Supine Long Arc Quad: PROM;Both;10 reps;Supine Hip ABduction/ADduction: PROM;5 reps;Supine   Shoulder Instructions       General Comments Pt with difficulty following command     Pertinent Vitals/ Pain       Pain Assessment: Faces Faces Pain Scale: Hurts little more Pain Location: generalized Pain Descriptors / Indicators: Grimacing;Guarding Pain Intervention(s): Limited activity within patient's tolerance;Monitored during session;Repositioned  Home Living Family/patient expects to be discharged to:: Unsure                                  Additional Comments: pt supposedly has a room mate.  Unable to determine any other information about the living situation      Prior Functioning/Environment          Comments: pt unable to give reliable information on PLOF.   Frequency  Min 2X/week        Progress Toward Goals  OT Goals(current goals can now be found in the care plan section)  Progress towards OT goals: Not progressing toward goals - comment(Poor activity tolerance, cognitive impairments)  Acute Rehab OT Goals Patient Stated Goal: pt unable  Plan      Co-evaluation                 AM-PAC OT "6 Clicks" Daily Activity     Outcome Measure   Help from another person eating meals?: Total Help from another person taking care of personal grooming?: A Lot Help from another person toileting, which includes using toliet, bedpan, or urinal?: Total Help from another person bathing (including washing, rinsing, drying)?: Total Help from another person to put on and taking off regular upper body clothing?: Total Help from another person to put on and taking off regular lower body clothing?: Total 6 Click Score: 7    End of Session    OT Visit Diagnosis: Unsteadiness on feet (R26.81);Muscle weakness (generalized) (M62.81);Pain;Cognitive communication deficit (R41.841) Symptoms and signs involving cognitive functions: Other cerebrovascular disease Pain - part of body: (generalized)   Activity Tolerance Patient limited by pain;Other (comment)(Poor activity tolerance)   Patient Left in bed;with bed alarm set   Nurse Communication          Time: [id number] OT Time Calculation (min): 18 min  Charges: OT Treatments $Therapeutic Activity: 8-22 mins     Britt Bottom 01/03/2020, 12:18 PM

## 2020-01-04 DIAGNOSIS — G9341 Metabolic encephalopathy: Secondary | ICD-10-CM | POA: Diagnosis not present

## 2020-01-04 DIAGNOSIS — C139 Malignant neoplasm of hypopharynx, unspecified: Secondary | ICD-10-CM | POA: Diagnosis not present

## 2020-01-04 DIAGNOSIS — K852 Alcohol induced acute pancreatitis without necrosis or infection: Secondary | ICD-10-CM | POA: Diagnosis not present

## 2020-01-04 DIAGNOSIS — K7030 Alcoholic cirrhosis of liver without ascites: Secondary | ICD-10-CM | POA: Diagnosis not present

## 2020-01-04 LAB — COMPREHENSIVE METABOLIC PANEL
ALT: 138 U/L — ABNORMAL HIGH (ref 0–44)
AST: 113 U/L — ABNORMAL HIGH (ref 15–41)
Albumin: 1.6 g/dL — ABNORMAL LOW (ref 3.5–5.0)
Alkaline Phosphatase: 652 U/L — ABNORMAL HIGH (ref 38–126)
Anion gap: 11 (ref 5–15)
BUN: 16 mg/dL (ref 6–20)
CO2: 22 mmol/L (ref 22–32)
Calcium: 9.4 mg/dL (ref 8.9–10.3)
Chloride: 102 mmol/L (ref 98–111)
Creatinine, Ser: 0.77 mg/dL (ref 0.61–1.24)
GFR calc Af Amer: >60 mL/min (ref >60)
GFR calc non Af Amer: >60 mL/min (ref >60)
Glucose, Bld: 82 mg/dL (ref 70–99)
Potassium: 4.6 mmol/L (ref 3.5–5.1)
Sodium: 135 mmol/L (ref 135–145)
Total Bilirubin: 1.1 mg/dL (ref 0.3–1.2)
Total Protein: 4.8 g/dL — ABNORMAL LOW (ref 6.5–8.1)

## 2020-01-04 LAB — CBC WITH DIFFERENTIAL/PLATELET
Abs Immature Granulocytes: 0.78 10*3/uL — ABNORMAL HIGH (ref 0.00–0.07)
Basophils Absolute: 0.1 10*3/uL (ref 0.0–0.1)
Basophils Relative: 1 %
Eosinophils Absolute: 0 10*3/uL (ref 0.0–0.5)
Eosinophils Relative: 0 %
HCT: 22.6 % — ABNORMAL LOW (ref 39.0–52.0)
Hemoglobin: 8.2 g/dL — ABNORMAL LOW (ref 13.0–17.0)
Immature Granulocytes: 13 %
Lymphocytes Relative: 6 %
Lymphs Abs: 0.4 10*3/uL — ABNORMAL LOW (ref 0.7–4.0)
MCH: 32 pg (ref 26.0–34.0)
MCHC: 36.3 g/dL — ABNORMAL HIGH (ref 30.0–36.0)
MCV: 88.3 fL (ref 80.0–100.0)
Monocytes Absolute: 0.6 10*3/uL (ref 0.1–1.0)
Monocytes Relative: 10 %
Neutro Abs: 4.2 10*3/uL (ref 1.7–7.7)
Neutrophils Relative %: 70 %
Platelets: 121 10*3/uL — ABNORMAL LOW (ref 150–400)
RBC: 2.56 MIL/uL — ABNORMAL LOW (ref 4.22–5.81)
RDW: 16.4 % — ABNORMAL HIGH (ref 11.5–15.5)
WBC: 6.1 10*3/uL (ref 4.0–10.5)
nRBC: 0.7 % — ABNORMAL HIGH (ref 0.0–0.2)

## 2020-01-04 LAB — GLUCOSE, CAPILLARY
Glucose-Capillary: 51 mg/dL — ABNORMAL LOW (ref 70–99)
Glucose-Capillary: 57 mg/dL — ABNORMAL LOW (ref 70–99)
Glucose-Capillary: 65 mg/dL — ABNORMAL LOW (ref 70–99)
Glucose-Capillary: 68 mg/dL — ABNORMAL LOW (ref 70–99)
Glucose-Capillary: 78 mg/dL (ref 70–99)
Glucose-Capillary: 86 mg/dL (ref 70–99)
Glucose-Capillary: 89 mg/dL (ref 70–99)
Glucose-Capillary: 91 mg/dL (ref 70–99)
Glucose-Capillary: 97 mg/dL (ref 70–99)

## 2020-01-04 LAB — PHOSPHORUS: Phosphorus: 6.8 mg/dL — ABNORMAL HIGH (ref 2.5–4.6)

## 2020-01-04 LAB — MAGNESIUM: Magnesium: 2 mg/dL (ref 1.7–2.4)

## 2020-01-04 MED ORDER — DEXTROSE 50 % IV SOLN
INTRAVENOUS | Status: AC
  Administered 2020-01-04: 50 mL
  Filled 2020-01-04: qty 50

## 2020-01-04 MED ORDER — DEXTROSE 10 % IV SOLN: INTRAVENOUS | Status: DC

## 2020-01-04 NOTE — Progress Notes (Signed)
RT came to access patient's stoma. Patient on room air with increased WOB but is not distressed with a dry cough. The stoma is narrowed with dried mucus plug but his sats are 93% on room air. RT attempted to remove the plug but it was too dry.  RT placed patient on ATC 5 L 28% to help loosen the dried secretions.  RT and RN spoke at bedside to continue the ATC to keep the secretions moist to help clear them and remove the plug.  Bedside RN notified RT she was able to remove the dried mucus plug a little while later. Patient has normal WOB at this time.

## 2020-01-04 NOTE — NC FL2 (Signed)
[place] LEVEL OF CARE SCREENING TOOL     IDENTIFICATION  Patient Name: Adam Marshall Birthdate: [birth year] Sex: male Admission Date (Current Location): 12/27/2019  [facility] and [place] Number:  Herbalist and Address:  [facility] [place], [place] [address] [place], [place] [address]      Provider Number: [id number]  Attending Physician Name and Address:  Allie Bossier, MD  Relative Name and Phone Number:  Trilby Drummer (brother) [phone]    Current Level of Care: Hospital Recommended Level of Care: [place] Prior Approval Number:    Date Approved/Denied:   PASRR Number: [id number]  Discharge Plan: SNF    Current Diagnoses: Patient Active Problem List   Diagnosis Date Noted  . Adult failure to thrive   . Goals of care, counseling/discussion   . Palliative care by specialist   . Severe protein-calorie malnutrition ([place]) 01/01/2020  . Refeeding syndrome 12/29/2019  . AKI (acute kidney injury) ([place]) 12/29/2019  . Other cirrhosis of liver ([place]) 12/29/2019  . Acute metabolic encephalopathy [id number]  . Alcoholic cirrhosis of liver without ascites ([place]) 12/29/2019  . Acute kidney injury ([place])   . Acute pancreatitis   . Shock ([place]) 12/11/2019  . Ear pain, left 02/27/2017  . Encounter for Medicare annual wellness exam 02/27/2017  . Sleep disturbance 10/27/2016  . Malignant neoplasm of prostate ([place]) 06/24/2016  . 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 Weakness 03/26/2016  . Gout 03/26/2016  . 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 HCAP (healthcare-associated pneumonia) 03/23/2016  . Hypokalemia 03/23/2016  . Hypomagnesemia 03/23/2016  . Protein-calorie malnutrition, severe ([place]) 03/23/2016  . Reflux esophagitis   . Pancreatic lesion   . Normochromic normocytic anemia   . Pyriform sinus mass   . Ascites   . Cirrhosis of liver with ascites ([place]) 03/14/2016  . Anemia due to chronic  blood loss 03/14/2016  . Respiratory failure ([place]) 03/14/2016  . Lesion of pancreas   . Generalized abdominal pain   . Hyponatremia   . Abscess of skin of abdomen 01/13/2016  . Leg cramping 01/13/2016  . Abnormal LFTs (liver function tests) 10/13/2015  . Alcohol use 10/13/2015  . Left knee pain 02/04/2015  . Encounter for general adult medical examination with abnormal findings 12/25/2014  . Medicare annual wellness visit, subsequent 12/25/2014  . Knee pain 12/08/2014  . Chronic pancreatitis ([place]) 05/03/2011  . Rhinorrhea 04/24/2011  . PREMATURE VENTRICULAR CONTRACTIONS 07/07/2010  . HYPERKALEMIA 06/29/2010  . ACUTE GOUTY ARTHROPATHY 06/28/2010  . HYPERTENSION, BENIGN ESSENTIAL 06/28/2010    Orientation RESPIRATION BLADDER Height & Weight     Self  Tracheostomy(Trach Stoma) Incontinent, External catheter Weight: 141 lb 15.6 oz (64.4 kg) Height:  5\' 6"  (167.6 cm)  BEHAVIORAL SYMPTOMS/MOOD NEUROLOGICAL BOWEL NUTRITION STATUS      Incontinent Diet(see discharge summary)  AMBULATORY STATUS COMMUNICATION OF NEEDS Skin   Extensive Assist Verbally Other (Comment)(MASD penis and scrotum, cracking on heels, skin tear medial penis)                       Personal Care Assistance Level of Assistance  Bathing, Feeding, Dressing, Total care Bathing Assistance: Maximum assistance Feeding assistance: Maximum assistance Dressing Assistance: Maximum assistance Total Care Assistance: Maximum assistance   Functional Limitations Info  Sight, Hearing, Speech Sight Info: Adequate Hearing Info: Adequate Speech Info: Adequate    SPECIAL CARE FACTORS FREQUENCY  PT (By licensed PT), OT (By licensed OT)  PT Frequency: min 5x weekly OT Frequency: min 5x weekly            Contractures Contractures Info: Not present    Additional Factors Info  Code Status, Allergies Code Status Info: full Allergies Info: Penicillins           Current Medications (01/04/2020):  This is the  current hospital active medication list Current Facility-Administered Medications  Medication Dose Route Frequency Provider Last Rate Last Admin  . 0.9 %  sodium chloride infusion   Intra-arterial PRN Marianna Payment, MD 10 mL/hr at 12/28/19 1114 New Bag at 12/28/19 1114  . 0.9 %  sodium chloride infusion   Intravenous PRN Rigoberto Noel, MD 10 mL/hr at 01/03/20 1158 250 mL at 01/03/20 1158  . acetaminophen (TYLENOL) tablet 650 mg  650 mg Per Tube Q4H PRN Arrien, Jimmy Picket, MD      . chlorhexidine (PERIDEX) 0.12 % solution 15 mL  15 mL Mouth Rinse BID Margaretha Seeds, MD   15 mL at 01/04/20 1200  . Chlorhexidine Gluconate Cloth 2 % PADS 6 each  6 each Topical Daily Margaretha Seeds, MD   6 each at 01/04/20 0945  . dextrose 5 %-0.9 % sodium chloride infusion   Intravenous Continuous Allie Bossier, MD 75 mL/hr at 01/04/20 0700 75 mL/hr at 01/04/20 0700  . feeding supplement (ENSURE ENLIVE) (ENSURE ENLIVE) liquid 237 mL  237 mL Oral TID BM Allie Bossier, MD   237 mL at 01/04/20 0000  . Gerhardt's butt cream   Topical BID PRN Margaretha Seeds, MD      . MEDLINE mouth rinse  15 mL Mouth Rinse q12n4p Margaretha Seeds, MD   15 mL at 01/04/20 1202  . multivitamin with minerals tablet 1 tablet  1 tablet Oral Daily Allie Bossier, MD   1 tablet at 01/03/20 [phone]  . oxyCODONE (Oxy IR/ROXICODONE) immediate release tablet 5 mg  5 mg Per NG tube Q6H PRN Arrien, Jimmy Picket, MD   5 mg at 01/01/20 2120  . polyethylene glycol (MIRALAX / GLYCOLAX) packet 17 g  17 g Per NG tube Daily PRN Arrien, Jimmy Picket, MD      . sodium chloride flush (NS) 0.9 % injection 10-40 mL  10-40 mL Intracatheter Q12H Kipp Brood, MD   10 mL at 12/31/19 0906  . sodium chloride flush (NS) 0.9 % injection 10-40 mL  10-40 mL Intracatheter PRN Kipp Brood, MD      . thiamine tablet 100 mg  100 mg Oral Daily Allie Bossier, MD   100 mg at 01/03/20 [id number]     Discharge Medications: Please see discharge summary for  a list of discharge medications.  Relevant Imaging Results:  Relevant Lab Results:   Additional Information SSN: [id number]  [place]teen Sam, LCSW

## 2020-01-04 NOTE — TOC Progression Note (Signed)
Transition of Care [facility]) - Progression Note    Patient Details  Name: Adam Marshall MRN: [id number] Date of Birth: 1961/01/07  Transition of Care [facility]) CM/SW [place], [place] Phone Number: 01/04/2020, 2:28 PM  Clinical Narrative:     CSW notes that on   4/28 patient's coretrak was dc'd which was SNF barrier as no SNFs take coretrak.    4/29 RNCM sent referral to [facility] and received information that patient was not an LTACH candidate.   4/30: Due to [facility] report that patient is not candidate for [facility], CSW started SNF process and sent referral to family's preference of [facility] as they have other family members at the facility. The admissions rep Levy Pupa stated he was at lunch and would review when coming back, CSW did not hear back from [facility] on this day.   Today 5/1 CSW has messaged [facility] to follow up regarding the SNF referral that was made, unable to predict if answer will be received due to it being the weekend.        Expected Discharge Plan and Services                                                 Social Determinants of Health (SDOH) Interventions    Readmission Risk Interventions No flowsheet data found.

## 2020-01-04 NOTE — TOC Progression Note (Signed)
Transition of Care [facility]) - Progression Note    Patient Details  Name: Adam Marshall MRN: [id number] Date of Birth: 1961-01-13  Transition of Care [facility] CM/SW [place], [place] Phone Number: [phone] 01/04/2020, 2:27 PM  Clinical Narrative:    CSW attempted to reach out to [facility] to ascertain if [facility] could accept patient. CSW had to leave message.  TOC team will continue to follow for discharge planning needs.        Expected Discharge Plan and Services                                                 Social Determinants of Health (SDOH) Interventions    Readmission Risk Interventions No flowsheet data found.

## 2020-01-04 NOTE — Progress Notes (Addendum)
PROGRESS NOTE    Adam Marshall  [id number] DOB: 30-May-1961 DOA: 12/29/2019 PCP: Adam Circle, FNP     Brief Narrative:  Patient was admitted to the hospital with a working diagnosis ofsepticshock due to acute pancreatitis, complicated by hypothermia, respiratory failure, metabolic encephalopathyand acute kidney injury.  58-year-BM PMHx altered mental status. 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 He also has prostate cancer,andalcoholic cirrhosis.  Patient had several days of altered mentation, EMS was called and patient was found hypotensive,on arrival to the ED his temperature was 83.3, blood pressure 89/50, heart rate 49.He was cachectic and ill looking appearance, positive systolic murmur, his lungs had decreased breath sounds bilaterally, his abdomen was tender in the epigastric region and left upper quadrant, no lower extremity edema.Patient was unable to follow commands but no signs of focality.  Patient was placed on broad-spectrum antibiotic therapy and vasopressors. On April 20 he developed respiratory distress, a Shiley 8 tracheostomy tube was inserted and patient was placed on mechanical ventilation. The following day on April 21 she was transitioned back to trach collar. Patient had an NG tube placed for feedings.  Patient required vasopressors while in the intensive care unit.  Transferred to [facility] on 12/27/19.  Patient with refeeding syndrome, aggressive correction of P, Mg, Ca and K.Patient with decreased po intake, continue Ng tube for supportive feedings and for medications.  Patient is allowed to eat, no risk of aspiration, no clinical signs of esophageal tracheal fistula.Diet changed toregularand will attempt to remove NG tube if maintains po intake.  Adam Marshall was removed on 04/26, with good toleration.      Subjective: 5/1 irritated today will not let you examine him.  Does not follow commands   Assessment & Plan:   Principal Problem:   Acute pancreatitis Active Problems:   Chronic pancreatitis (HCC)   Respiratory failure (HCC)   Protein-calorie malnutrition, severe (HCC)   Cancer of hypopharynx (HCC)   Shock ([place])   Acute kidney injury ([place])   Refeeding syndrome   AKI (acute kidney injury) ([place])   Acute metabolic encephalopathy   Alcoholic cirrhosis of liver without ascites ([place])   Severe protein-calorie malnutrition ([place])   Adult failure to thrive   Goals of care, counseling/discussion   Palliative care by specialist   Septic shock/acute EtOH pancreatitis (P[facility]) - abdomenwith nopancreaticabscess (04/21). Completed antibiotic therapy with meropenem.Patient with no significant abdominal pain -Continue to encourage p.o. intake see malnutrition,   Acute respiratory failure with hypoxia/Neck cancer  -s/p laryngectomy 2017 + neck dissection + XRT . -Trach cannula has been removed with good toleration, will continue trach collar and oxymetry monitoring.   Out of bed as tolerated and physical/ occupational therapy.   -EtOH liver cirrhosis No results for input(s): INR in the last 168 hours.] -Liver enzymes all elevated but relatively stable.  We will continue to monitor closely -Albumin= 1.6  Anemia multifactorial (chronic disease + iron deficiency) -S/p 1 dose IV iron -4/19 transfuse 1 unit PRBC -4/22 transfuse 1 unit PRBC -Negative signs of overt bleeding Recent Labs  Lab 01/01/20 0602 01/02/20 0335 01/03/20 0451 01/03/20 1015 01/04/20 0624  HGB 7.7* 7.9* 7.9* 8.0* 8.2*  -Stable; transfuse for hemoglobin<7  Thrombocytopenia -4/22 transfuse 1 unit platelet pheresis  AKI Recent Labs  Lab 12/31/19 0459 01/01/20 0602 01/02/20 0335 01/03/20 0451 01/04/20 0624  CREATININE 0.78 0.60* 0.66 0.71 0.77  -Resolved  Penile lesion  and Hx of prostate  cancer. -Local wound care.Plan to remove foley catheter today.  Metabolic encephalopathy. -Continue withthiamine and multivitamins. -Patient follows some commands but  waxes and wanes.   Severe protein calorie malnutrition -Patient currently not consuming enough calories daily to sustain his recovery. -4/29 Per RN Adam Marshall believes patient will do well on dysphagia 1 diet, as he has been eating applesauce and puddings. -4/29 dysphagia 1 diet. -5/1 patient now not consuming any food intake. -5/1 start D10 43ml/hr  Hypomagnesmia -Magnesium goal> 2 -Magnesium IV 3 g    Goals of care -4/28 Palliative Care consult; alcoholic pancreatitis, neck cancer s/p laryngectomy in 2017 and neck dissection s/p radiation therapy, liver cirrhosis due to EtOH abuse, Hx prostate cancer.  Eval for change of CODE STATUS to DNR. -Per palliative care note 4/30 family is waiting to receive patient's living will and will discuss CODE STATUS.  Family is in line with moving patient to facility, see full note.   DVT prophylaxis: SCD Code Status: Full Family Communication:  Disposition Plan:  1.  Where the patient is from 2.  Anticipated d/c place. 3.  Barriers to d/c severe protein calorie malnutrition and no current way of providing sufficient calories for recovery.   Consultants:  PCCM   Procedures/Significant Events:    I have personally reviewed and interpreted all radiology studies and my findings are as above.  VENTILATOR SETTINGS:    Cultures   Antimicrobials:    Devices    LINES / TUBES:      Continuous Infusions: . sodium chloride 10 mL/hr at 12/28/19 1114  . sodium chloride 250 mL (01/03/20 1158)  . dextrose 5 % and 0.9% NaCl 75 mL/hr (01/04/20 0700)     Objective: Vitals:   01/04/20 0820 01/04/20 1142 01/04/20 1200 01/04/20 1232  BP:  (!) 129/100 137/85 137/85  Pulse: 92 90 89 85  Resp:  (!) 21  18  Temp:  98.1 F (36.7 C)    TempSrc:  Oral    SpO2: 99% 95%  99% 99%  Weight:      Height:        Intake/Output Summary (Last 24 hours) at 01/04/2020 1431 Last data filed at 01/04/2020 1314 Gross per 24 hour  Intake 1619.03 ml  Output 1975 ml  Net -355.97 ml   Filed Weights   01/02/20 0459 01/03/20 0444 01/04/20 0109  Weight: 63.4 kg 67.4 kg 64.4 kg   Physical Exam:  General: Sleepy, no acute respiratory distress, cachectic, irritated does not want you to examine him pushes you away Eyes: negative scleral hemorrhage, negative anisocoria, negative icterus ENT: Negative Runny nose, negative gingival bleeding, Neck:  Negative scars, masses, torticollis, lymphadenopathy, JVD,open trach site (trach removed 4/26 ) Lungs: Clear to auscultation bilaterally without wheezes or crackles Cardiovascular: Regular rate and rhythm without murmur gallop or rub normal S1 and S2 Abdomen: negative abdominal pain, nondistended, positive soft, bowel sounds, no rebound, no ascites, no appreciable mass Extremities: No significant cyanosis, clubbing, or edema bilateral lower extremities Skin: Negative rashes, lesions, ulcers Psychiatric: Able to assess altered mental status Central nervous system: Spontaneously moves extremities does not follow commands.  .     Data Reviewed: Care during the described time interval was provided by me .  I have reviewed this patient's available data, including medical history, events of note, physical examination, and all test results as part of my evaluation.  CBC: Recent Labs  Lab 01/01/20 0602 01/02/20 0335 01/03/20 0451 01/03/20 1015 01/04/20 [id number]  WBC 3.7* 3.4* 4.4 4.9 6.1  NEUTROABS 2.8 2.5 4.0 4.3 4.2  HGB 7.7* 7.9* 7.9* 8.0* 8.2*  HCT 22.7* 23.6* 23.7* 23.3* 22.6*  MCV 91.2 91.8 92.6 90.3 88.3  PLT 71* 89* 126* 124* [id number]*   Basic Metabolic Panel: Recent Labs  Lab 12/31/19 0459 01/01/20 0602 01/02/20 0335 01/03/20 0451 01/04/20 0624  NA 135 133* 134* 133* 135  K 3.9 4.0 4.3 4.3 4.6  CL 101 100 100 100 102    CO2 24 22 24 25 22   GLUCOSE 119* 81 74 87 82  BUN 26* 21* 19 15 16   CREATININE 0.78 0.60* 0.66 0.71 0.77  CALCIUM 8.8* 9.0 9.1 9.2 9.4  MG 1.6* 1.9 1.7 1.5* 2.0  PHOS 4.6 4.7* 5.3* 5.8* 6.8*   GFR: Estimated Creatinine Clearance: 89.7 mL/min (by C-G formula based on SCr of 0.77 mg/dL). Liver Function Tests: Recent Labs  Lab 01/02/20 0335 01/03/20 0451 01/04/20 0624  AST 102* 126* 113*  ALT 114* 146* 138*  ALKPHOS 514* 615* 652*  BILITOT 1.2 1.4* 1.1  PROT 4.7* 4.8* 4.8*  ALBUMIN 1.6* 1.5* 1.6*   No results for input(s): LIPASE, AMYLASE in the last 168 hours. No results for input(s): AMMONIA in the last 168 hours. Coagulation Profile: No results for input(s): INR, PROTIME in the last 168 hours. Cardiac Enzymes: No results for input(s): CKTOTAL, CKMB, CKMBINDEX, TROPONINI in the last 168 hours. BNP (last 3 results) No results for input(s): PROBNP in the last 8760 hours. HbA1C: No results for input(s): HGBA1C in the last 72 hours. CBG: Recent Labs  Lab 01/04/20 0019 01/04/20 0417 01/04/20 0749 01/04/20 1140 01/04/20 1312  GLUCAP 91 78 68* 57* 86   Lipid Profile: No results for input(s): CHOL, HDL, LDLCALC, TRIG, CHOLHDL, LDLDIRECT in the last 72 hours. Thyroid Function Tests: No results for input(s): TSH, T4TOTAL, FREET4, T3FREE, THYROIDAB in the last 72 hours. Anemia Panel: No results for input(s): VITAMINB12, FOLATE, FERRITIN, TIBC, IRON, RETICCTPCT in the last 72 hours. Sepsis Labs: No results for input(s): PROCALCITON, LATICACIDVEN in the last 168 hours.  No results found for this or any previous visit (from the past 240 hour(s)).       Radiology Studies: No results found.      Scheduled Meds: . chlorhexidine  15 mL Mouth Rinse BID  . Chlorhexidine Gluconate Cloth  6 each Topical Daily  . feeding supplement (ENSURE ENLIVE)  237 mL Oral TID BM  . mouth rinse  15 mL Mouth Rinse q12n4p  . multivitamin with minerals  1 tablet Oral Daily  . sodium  chloride flush  10-40 mL Intracatheter Q12H  . thiamine  100 mg Oral Daily   Continuous Infusions: . sodium chloride 10 mL/hr at 12/28/19 1114  . sodium chloride 250 mL (01/03/20 1158)  . dextrose 5 % and 0.9% NaCl 75 mL/hr (01/04/20 0700)     LOS: 13 days    Time spent:40 min    Arrick Dutton, Geraldo Docker, MD [facility] Pager [phone]  If 7PM-7AM, please contact night-coverage [email or web address] Password [facility] 01/04/2020, 2:31 PM

## 2020-01-04 NOTE — Progress Notes (Signed)
RN notifed Camera operator, concerning pt low grade temperature.  Pt temperature was checked axillary and rectal.  RN also, notified on call MD with [facility] via [place].  New orders were sent and pt was later place on warming blanket.

## 2020-01-04 NOTE — Progress Notes (Addendum)
During assessment, patient noted to have increased labored respirations with poor inspiratory / expiratory effort through very crusty trach stoma, and was slightly diaphoretic.  RT in to assess and patient placed on 28% trach collar to loosen mucous on stoma.  Will continue to moisten until removal is possible.  Vital signs remain stable at this time.  Patient has laryngectomy.  Vital to maintain optimal airway

## 2020-01-04 NOTE — Progress Notes (Signed)
Patient continues with poor appetite.  Is refusing all po intake today and CBGs have been sub-therapeutic, requiring D50% boluses several times throughout the shift.  MD notified.  See NO.  Palliative care continues to attempt to work with family re:  Goals of care.

## 2020-01-04 NOTE — Progress Notes (Signed)
Removal of 3.5 cm mucous plug successful.  Respirations no longer labored.  Optimal airway achieved.

## 2020-01-04 DEATH — deceased

## 2020-01-05 DIAGNOSIS — E162 Hypoglycemia, unspecified: Secondary | ICD-10-CM

## 2020-01-05 DIAGNOSIS — K859 Acute pancreatitis without necrosis or infection, unspecified: Secondary | ICD-10-CM | POA: Diagnosis not present

## 2020-01-05 DIAGNOSIS — C139 Malignant neoplasm of hypopharynx, unspecified: Secondary | ICD-10-CM | POA: Diagnosis not present

## 2020-01-05 DIAGNOSIS — Z515 Encounter for palliative care: Secondary | ICD-10-CM

## 2020-01-05 DIAGNOSIS — G9341 Metabolic encephalopathy: Secondary | ICD-10-CM | POA: Diagnosis not present

## 2020-01-05 DIAGNOSIS — K7030 Alcoholic cirrhosis of liver without ascites: Secondary | ICD-10-CM

## 2020-01-05 DIAGNOSIS — K852 Alcohol induced acute pancreatitis without necrosis or infection: Secondary | ICD-10-CM | POA: Diagnosis not present

## 2020-01-05 LAB — CBC WITH DIFFERENTIAL/PLATELET
Abs Immature Granulocytes: 0.68 10*3/uL — ABNORMAL HIGH (ref 0.00–0.07)
Basophils Absolute: 0 10*3/uL (ref 0.0–0.1)
Basophils Relative: 1 %
Eosinophils Absolute: 0 10*3/uL (ref 0.0–0.5)
Eosinophils Relative: 1 %
HCT: 21.4 % — ABNORMAL LOW (ref 39.0–52.0)
Hemoglobin: 7.3 g/dL — ABNORMAL LOW (ref 13.0–17.0)
Immature Granulocytes: 12 %
Lymphocytes Relative: 5 %
Lymphs Abs: 0.3 10*3/uL — ABNORMAL LOW (ref 0.7–4.0)
MCH: 31.1 pg (ref 26.0–34.0)
MCHC: 34.1 g/dL (ref 30.0–36.0)
MCV: 91.1 fL (ref 80.0–100.0)
Monocytes Absolute: 0.5 10*3/uL (ref 0.1–1.0)
Monocytes Relative: 9 %
Neutro Abs: 4.1 10*3/uL (ref 1.7–7.7)
Neutrophils Relative %: 72 %
Platelets: 151 10*3/uL (ref 150–400)
RBC: 2.35 MIL/uL — ABNORMAL LOW (ref 4.22–5.81)
RDW: 16.8 % — ABNORMAL HIGH (ref 11.5–15.5)
WBC: 5.6 10*3/uL (ref 4.0–10.5)
nRBC: 0.4 % — ABNORMAL HIGH (ref 0.0–0.2)

## 2020-01-05 LAB — COMPREHENSIVE METABOLIC PANEL
ALT: 116 U/L — ABNORMAL HIGH (ref 0–44)
AST: 93 U/L — ABNORMAL HIGH (ref 15–41)
Albumin: 1.5 g/dL — ABNORMAL LOW (ref 3.5–5.0)
Alkaline Phosphatase: 739 U/L — ABNORMAL HIGH (ref 38–126)
Anion gap: 10 (ref 5–15)
BUN: 12 mg/dL (ref 6–20)
CO2: 23 mmol/L (ref 22–32)
Calcium: 8.8 mg/dL — ABNORMAL LOW (ref 8.9–10.3)
Chloride: 100 mmol/L (ref 98–111)
Creatinine, Ser: 0.86 mg/dL (ref 0.61–1.24)
GFR calc Af Amer: >60 mL/min (ref >60)
GFR calc non Af Amer: >60 mL/min (ref >60)
Glucose, Bld: 77 mg/dL (ref 70–99)
Potassium: 3.9 mmol/L (ref 3.5–5.1)
Sodium: 133 mmol/L — ABNORMAL LOW (ref 135–145)
Total Bilirubin: 0.9 mg/dL (ref 0.3–1.2)
Total Protein: 4.5 g/dL — ABNORMAL LOW (ref 6.5–8.1)

## 2020-01-05 LAB — GLUCOSE, CAPILLARY
Glucose-Capillary: 131 mg/dL — ABNORMAL HIGH (ref 70–99)
Glucose-Capillary: 70 mg/dL (ref 70–99)
Glucose-Capillary: 71 mg/dL (ref 70–99)
Glucose-Capillary: 74 mg/dL (ref 70–99)
Glucose-Capillary: 77 mg/dL (ref 70–99)
Glucose-Capillary: 81 mg/dL (ref 70–99)

## 2020-01-05 LAB — PHOSPHORUS: Phosphorus: 6.1 mg/dL — ABNORMAL HIGH (ref 2.5–4.6)

## 2020-01-05 LAB — MAGNESIUM: Magnesium: 1.6 mg/dL — ABNORMAL LOW (ref 1.7–2.4)

## 2020-01-05 MED ORDER — HYDROMORPHONE BOLUS VIA INFUSION
0.5000 mg | INTRAVENOUS | Status: DC | PRN
  Administered 2020-01-05: 0.5 mg via INTRAVENOUS
  Filled 2020-01-05: qty 1

## 2020-01-05 MED ORDER — LORAZEPAM 2 MG/ML IJ SOLN
0.5000 mg | Freq: Two times a day (BID) | INTRAMUSCULAR | Status: DC
  Administered 2020-01-05 – 2020-01-06 (×4): 0.5 mg via INTRAVENOUS
  Filled 2020-01-05 (×4): qty 1

## 2020-01-05 MED ORDER — LORAZEPAM 2 MG/ML IJ SOLN
0.5000 mg | INTRAMUSCULAR | Status: DC | PRN
  Administered 2020-01-05: 0.5 mg via INTRAVENOUS
  Filled 2020-01-05: qty 1

## 2020-01-05 MED ORDER — SODIUM CHLORIDE 0.9 % IV SOLN
2.0000 mg/h | INTRAVENOUS | Status: DC
  Administered 2020-01-05: 0.25 mg/h via INTRAVENOUS
  Administered 2020-01-06: 1 mg/h via INTRAVENOUS
  Filled 2020-01-05: qty 2.5
  Filled 2020-01-05: qty 5

## 2020-01-05 MED ORDER — HALOPERIDOL LACTATE 2 MG/ML PO CONC
0.5000 mg | ORAL | Status: DC | PRN
  Filled 2020-01-05: qty 0.3

## 2020-01-05 MED ORDER — BIOTENE DRY MOUTH MT LIQD: 15.0000 mL | OROMUCOSAL | Status: DC | PRN

## 2020-01-05 MED ORDER — DEXTROSE 50 % IV SOLN: 25.0000 mL | INTRAVENOUS | Status: DC

## 2020-01-05 MED ORDER — DEXTROSE 50 % IV SOLN
INTRAVENOUS | Status: AC
  Administered 2020-01-05: 50 mL
  Filled 2020-01-05: qty 50

## 2020-01-05 MED ORDER — HALOPERIDOL 0.5 MG PO TABS
0.5000 mg | ORAL_TABLET | ORAL | Status: DC | PRN
  Filled 2020-01-05: qty 1

## 2020-01-05 MED ORDER — GLYCOPYRROLATE 0.2 MG/ML IJ SOLN
0.2000 mg | Freq: Three times a day (TID) | INTRAMUSCULAR | Status: DC
  Administered 2020-01-05 – 2020-01-06 (×5): 0.2 mg via INTRAVENOUS
  Filled 2020-01-05 (×4): qty 1

## 2020-01-05 MED ORDER — GLYCOPYRROLATE 0.2 MG/ML IJ SOLN
0.2000 mg | INTRAMUSCULAR | Status: DC | PRN
  Administered 2020-01-06: 0.2 mg via INTRAVENOUS
  Filled 2020-01-05: qty 1

## 2020-01-05 MED ORDER — DEXTROSE 50 % IV SOLN
INTRAVENOUS | Status: AC
  Filled 2020-01-05: qty 50

## 2020-01-05 MED ORDER — DEXTROSE 50 % IV SOLN: 25.0000 mL | INTRAVENOUS | Status: DC | PRN

## 2020-01-05 MED ORDER — GLYCOPYRROLATE 0.2 MG/ML IJ SOLN
0.2000 mg | INTRAMUSCULAR | Status: DC | PRN
  Filled 2020-01-05: qty 1

## 2020-01-05 MED ORDER — POLYVINYL ALCOHOL 1.4 % OP SOLN
1.0000 [drp] | Freq: Four times a day (QID) | OPHTHALMIC | Status: DC | PRN
  Filled 2020-01-05: qty 15

## 2020-01-05 MED ORDER — HALOPERIDOL LACTATE 5 MG/ML IJ SOLN: 0.5000 mg | INTRAMUSCULAR | Status: DC | PRN

## 2020-01-05 MED ORDER — HYDROMORPHONE BOLUS VIA INFUSION
0.5000 mg | INTRAVENOUS | Status: DC | PRN
  Filled 2020-01-05: qty 1

## 2020-01-05 MED ORDER — ONDANSETRON 4 MG PO TBDP
4.0000 mg | ORAL_TABLET | Freq: Four times a day (QID) | ORAL | Status: DC | PRN
  Filled 2020-01-05: qty 1

## 2020-01-05 MED ORDER — ONDANSETRON HCL 4 MG/2ML IJ SOLN: 4.0000 mg | Freq: Four times a day (QID) | INTRAMUSCULAR | Status: DC | PRN

## 2020-01-05 MED ORDER — GLYCOPYRROLATE 1 MG PO TABS
1.0000 mg | ORAL_TABLET | ORAL | Status: DC | PRN
  Filled 2020-01-05: qty 1

## 2020-01-05 NOTE — Progress Notes (Signed)
Daily Progress Note   Patient Name: Adam Marshall       Date: 01/05/2020 DOB: 28-Dec-1960  Age: 59 y.o. MRN#: [id number] Attending Physician: Allie Bossier, MD Primary Care Physician: [name], FNP Admit Date: 12/07/2019  Reason for Consultation/Follow-up: To discuss complex medical decision making related to patient's goals of care  Received a call from Dr. Sherral Hammers.  Patient's condition has worsened.  He is recommending Hospice.  Subjective: Talked with son Nicole Kindred, daughter Louisa Second, and Niece Ubaldo Glassing.  Patient's CBG is not staying up despite D10 and amps of D50.  His body is failing.  After some family discussion - They felt the best approach would be to focus on his comfort. We discussed [place] referral.    Initially we agreed to try to continue CBG support until Tuesday when Nicole Kindred could arrive from [place].    Later in the day we realized patient's body is unable to handle D10 fluids his arms and legs are quite swollen.  I expressed concern that fluid will eventually accumulate in his lungs.  Considered stopping IVF and just using amps of D50 to maintain CBG - after considering this realized it would not be at all comfortable for Mr. Hickson.  Called both Nicole Kindred and [place] and gave them the information that Mr. Rosel is not processing the D10 fluids appropriately anymore.  We will not be able to maintain his CBG comfortably.  Nicole Kindred felt his father's comfort was the priority.  I explained that I would stop the D10 and D50, and his father would likely pass quickly - most likely before Tuesday.  Family understands and wants the patient to be comfortable.   Assessment: 59 yo gentleman in pain.  Unable to speak.  Refuses to eat.  Twitching.   Patient Profile/HPI:  59 y.o. male   with past medical history of squamous cell carcinoma of the sinus s/p laryngectomy, cirrhosis, ETOH abuse, chronic pancreatitis, and prostate cancer admitted on 12/28/2019 with AMS. We was treated for septic shock d/t acute pancreatitis. Hospital course complicated by respiratory failure - required tracheostomy and mechanical ventilation. Lurline Idol was removed 4/26. Albumin of 1.6. PMT consulted for [place].  Length of Stay: 14   Vital Signs: BP 117/81 (BP Location: Left Leg)   Pulse 94   Temp 98 F (36.7 C) (Oral)  Resp 20   Ht 5\' 6"  (1.676 m)   Wt 63.8 kg   SpO2 98%   BMI 22.70 kg/m  SpO2: SpO2: 98 % O2 Device: O2 Device: Tracheostomy Collar O2 Flow Rate: O2 Flow Rate (L/min): 5 L/min       Palliative Assessment/Data: 10%     Palliative Care Plan    Recommendations/Plan:  Shift to full comfort.   Will stop IVF as his body is unable to process them.  Will initiate low dose opioid IV gtt for comfort as well as anti anxiety medication, and medication for secretions  Stop any medical interventions not aimed at comfort.  Unrestricted visitation privileges - to be directed by bedside RN  Code Status:  DNR  Prognosis:   Hours - Days   Discharge Planning:  Anticipated Hospital Death if he stabilizes we will consider hospice house but I believe he will pass very quickly due to hypoglycemia.  Care plan was discussed with [facility] MD, Family, RN  Thank you for allowing the Palliative Medicine Team to assist in the care of this patient.  Total time spent:  45 min.     Greater than 50%  of this time was spent counseling and coordinating care related to the above assessment and plan.  Florentina Jenny, PA-C Palliative Medicine  Please contact Palliative MedicineTeam phone at [phone] for questions and concerns between 7 am - 7 pm.   Please see AMION for individual provider pager numbers.

## 2020-01-05 NOTE — Progress Notes (Signed)
PROGRESS NOTE    Adam Marshall  [id number] DOB: [birth year] DOA: 12/31/2019 PCP: [name], FNP     Brief Narrative:  Patient was admitted to the hospital with a working diagnosis ofsepticshock due to acute pancreatitis, complicated by hypothermia, respiratory failure, metabolic encephalopathyand acute kidney injury.  58-year-BM PMHx altered mental status. 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 He also has prostate cancer,andalcoholic cirrhosis.  Patient had several days of altered mentation, EMS was called and patient was found hypotensive,on arrival to the ED his temperature was 83.3, blood pressure 89/50, heart rate 49.He was cachectic and ill looking appearance, positive systolic murmur, his lungs had decreased breath sounds bilaterally, his abdomen was tender in the epigastric region and left upper quadrant, no lower extremity edema.Patient was unable to follow commands but no signs of focality.  Patient was placed on broad-spectrum antibiotic therapy and vasopressors. On April 20 he developed respiratory distress, a Shiley 8 tracheostomy tube was inserted and patient was placed on mechanical ventilation. The following day on April 21 she was transitioned back to trach collar. Patient had an NG tube placed for feedings.  Patient required vasopressors while in the intensive care unit.  Transferred to [facility] on 12/27/19.  Patient with refeeding syndrome, aggressive correction of P, Mg, Ca and K.Patient with decreased po intake, continue Ng tube for supportive feedings and for medications.  Patient is allowed to eat, no risk of aspiration, no clinical signs of esophageal tracheal fistula.Diet changed toregularand will attempt to remove NG tube if maintains po intake.  Lurline Idol was removed on 04/26, with good toleration.     Subjective: 5/2 patient still will not follow commands not eating or drinking.     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 Completed antibiotic therapy with meropenem.Patient with no significant abdominal pain -Continue to encourage p.o. intake see malnutrition,  -Comfort care  Acute respiratory failure with hypoxia/Neck cancer  -s/p laryngectomy 2017 + neck dissection + XRT . -Trach cannula has been removed with good toleration, will continue trach collar and oxymetry monitoring.  -Comfort care  -EtOH liver cirrhosis No results for input(s): INR in the last 168 hours.] -Liver enzymes all elevated but relatively stable.  We will continue to monitor closely -Albumin= 1.6 -Comfort care  Anemia multifactorial (chronic disease + iron deficiency) -S/p 1 dose IV iron -4/19 transfuse 1 unit PRBC -4/22 transfuse 1 unit PRBC -Negative signs of overt bleeding Recent Labs  Lab 01/02/20 0335 01/03/20 0451 01/03/20 1015 01/04/20 0624 01/05/20 0609  HGB 7.9* 7.9* 8.0* 8.2* 7.3*  -Stable; transfuse for hemoglobin<7 -Comfort care  Thrombocytopenia -4/22 transfuse 1 unit platelet pheresis  AKI Recent Labs  Lab 01/01/20 0602 01/02/20 0335 01/03/20 0451 01/04/20 0624 01/05/20 0609  CREATININE 0.60* 0.66 0.71 0.77 0.86  -Resolved  Penile lesion and Hx of prostate cancer. -  Local  wound care.Plan to remove foley catheter today.  Metabolic encephalopathy. -Continue withthiamine and multivitamins. -Patient follows some commands but  waxes and wanes.  -Comfort care  Severe protein calorie malnutrition -Patient currently not consuming enough calories daily to sustain his recovery. -4/29 Per RN Adam Marshall believes patient will do well on dysphagia 1 diet, as he has been eating applesauce and puddings. -4/29 dysphagia 1 diet. -5/1 patient now not consuming any food intake. -5/1 start D10 33ml/hr -Comfort care  Hypomagnesmia -Magnesium goal> 2 -Magnesium IV 3 g    Goals of care -4/28 Palliative Care consult; alcoholic pancreatitis, neck cancer s/p laryngectomy in 2017 and neck dissection s/p radiation therapy, liver cirrhosis due to EtOH abuse, Hx prostate cancer.  Eval for change of CODE STATUS to DNR. -Per palliative care note 4/30 family is waiting to receive patient's living will and will discuss CODE STATUS.  Family is in line with moving patient to facility, see full note.   DVT prophylaxis: SCD Code Status: Full Family Communication: 5/2 spoke with Adam Marshall niece counseled on patient's current status i.e. slowly starting to death. Patient appears to want to be made comfort care i.e. refuses any nutrition refuses to allow nursing staff to appropriately care for stoma. Adam Marshall is on her way to the hospital and will attempt to have a family conference via phone with brother, his wife.   5/2 ADDENDUM Adam Marshall (son) return my phone call.  Brought him up-to-date on father's current health situation and explained that we felt father wanted to be left alone and made comfortable.  Son stated that after he had looked through the will and thought about it he concurred with making father comfort care. Disposition Plan:  1.  Where the patient is from 2.  Anticipated d/c place. 3.  Barriers to d/c severe protein calorie malnutrition and no current way of  providing sufficient calories for recovery.   Consultants:  PCCM   Procedures/Significant Events:    I have personally reviewed and interpreted all radiology studies and my findings are as above.  VENTILATOR SETTINGS:    Cultures   Antimicrobials:    Devices    LINES / TUBES:      Continuous Infusions: . sodium chloride 250 mL (01/03/20 1158)  . dextrose 100 mL/hr at 01/05/20 0550     Objective: Vitals:   01/05/20 0030 01/05/20 0042 01/05/20 0400 01/05/20 0805  BP:  125/84 124/78 121/78  Pulse: (!) 103 96 94 95  Resp:   18 18  Temp: 98.4 F (36.9 C)  98.7 F (37.1 C) 97.6 F (36.4 C)  TempSrc: Oral  Oral Oral  SpO2: 97% 96% 97% 97%  Weight:   63.8 kg   Height:   5\' 6"  (1.676 m)     Intake/Output Summary (Last 24 hours) at 01/05/2020 [id number] Last data filed at 01/05/2020 0900 Gross per 24 hour  Intake 2119.14 ml  Output 2425 ml  Net -305.86 ml   Filed Weights   01/03/20 0444 01/04/20 0109 01/05/20 0400  Weight: 67.4 kg 64.4 kg 63.8 kg   Physical Exam:  General: Sleepy, no acute respiratory distress, cachectic, irritated does not want you to examine him pushes you away Eyes: negative scleral hemorrhage, negative anisocoria, negative icterus ENT: Negative Runny nose, negative gingival bleeding, Neck:  Negative scars, masses, torticollis, lymphadenopathy, JVD,open trach site (trach removed 4/26 ) Lungs: Clear to auscultation bilaterally without wheezes or crackles Cardiovascular: Regular rate and rhythm without murmur gallop or rub normal S1  and S2 Abdomen: negative abdominal pain, nondistended, positive soft, bowel sounds, no rebound, no ascites, no appreciable mass Extremities: No significant cyanosis, clubbing, or edema bilateral lower extremities Skin: Negative rashes, lesions, ulcers Psychiatric: Able to assess altered mental status Central nervous system: Spontaneously moves extremities does not follow commands.  .     Data Reviewed: Care  during the described time interval was provided by me .  I have reviewed this patient's available data, including medical history, events of note, physical examination, and all test results as part of my evaluation.  CBC: Recent Labs  Lab 01/02/20 0335 01/03/20 0451 01/03/20 1015 01/04/20 0624 01/05/20 0609  WBC 3.4* 4.4 4.9 6.1 5.6  NEUTROABS 2.5 4.0 4.3 4.2 4.1  HGB 7.9* 7.9* 8.0* 8.2* 7.3*  HCT 23.6* 23.7* 23.3* 22.6* 21.4*  MCV 91.8 92.6 90.3 88.3 91.1  PLT 89* 126* 124* 121* [id number]   Basic Metabolic Panel: Recent Labs  Lab 01/01/20 0602 01/02/20 0335 01/03/20 0451 01/04/20 0624 01/05/20 0609  NA 133* 134* 133* 135 133*  K 4.0 4.3 4.3 4.6 3.9  CL 100 100 100 102 100  CO2 22 24 25 22 23   GLUCOSE 81 74 87 82 77  BUN 21* 19 15 16 12   CREATININE 0.60* 0.66 0.71 0.77 0.86  CALCIUM 9.0 9.1 9.2 9.4 8.8*  MG 1.9 1.7 1.5* 2.0 1.6*  PHOS 4.7* 5.3* 5.8* 6.8* 6.1*   GFR: Estimated Creatinine Clearance: 83.5 mL/min (by C-G formula based on SCr of 0.86 mg/dL). Liver Function Tests: Recent Labs  Lab 01/02/20 0335 01/03/20 0451 01/04/20 0624 01/05/20 0609  AST 102* 126* 113* 93*  ALT 114* 146* 138* 116*  ALKPHOS 514* 615* 652* 739*  BILITOT 1.2 1.4* 1.1 0.9  PROT 4.7* 4.8* 4.8* 4.5*  ALBUMIN 1.6* 1.5* 1.6* 1.5*   No results for input(s): LIPASE, AMYLASE in the last 168 hours. No results for input(s): AMMONIA in the last 168 hours. Coagulation Profile: No results for input(s): INR, PROTIME in the last 168 hours. Cardiac Enzymes: No results for input(s): CKTOTAL, CKMB, CKMBINDEX, TROPONINI in the last 168 hours. BNP (last 3 results) No results for input(s): PROBNP in the last 8760 hours. HbA1C: No results for input(s): HGBA1C in the last 72 hours. CBG: Recent Labs  Lab 01/04/20 2023 01/04/20 2104 01/05/20 0024 01/05/20 0428 01/05/20 0802  GLUCAP 65* 97 81 77 70   Lipid Profile: No results for input(s): CHOL, HDL, LDLCALC, TRIG, CHOLHDL, LDLDIRECT in the last 72  hours. Thyroid Function Tests: No results for input(s): TSH, T4TOTAL, FREET4, T3FREE, THYROIDAB in the last 72 hours. Anemia Panel: No results for input(s): VITAMINB12, FOLATE, FERRITIN, TIBC, IRON, RETICCTPCT in the last 72 hours. Sepsis Labs: No results for input(s): PROCALCITON, LATICACIDVEN in the last 168 hours.  No results found for this or any previous visit (from the past 240 hour(s)).       Radiology Studies: No results found.      Scheduled Meds: . dextrose      . chlorhexidine  15 mL Mouth Rinse BID  . Chlorhexidine Gluconate Cloth  6 each Topical Daily  . feeding supplement (ENSURE ENLIVE)  237 mL Oral TID BM  . mouth rinse  15 mL Mouth Rinse q12n4p  . multivitamin with minerals  1 tablet Oral Daily  . thiamine  100 mg Oral Daily   Continuous Infusions: . sodium chloride 250 mL (01/03/20 1158)  . dextrose 100 mL/hr at 01/05/20 0550     LOS: 14 days  Time spent:40 min    Hector Venne, Geraldo Docker, MD [facility] Pager [phone]  If 7PM-7AM, please contact night-coverage [email or web address] Password [email or web address] 01/05/2020, 9:21 AM

## 2020-01-05 NOTE — Progress Notes (Signed)
Patients blood sugar is 70 this AM. Patient refusing any oral intake. Refusing juice and food despite multiple attempts. Also refusing medications. MD made aware. D50 given.

## 2020-01-05 NOTE — Progress Notes (Signed)
RT called to assess patient for mucus plug. RN expressed that she attempted to remove plug but it fell back into stoma. RT suctioned patient with minimal secretions retrieved. Patient vitals normal with 96% O2 saturation. Patient in no distress at this time. RT will continue to monitor.

## 2020-01-06 DIAGNOSIS — R627 Adult failure to thrive: Secondary | ICD-10-CM | POA: Diagnosis not present

## 2020-01-06 DIAGNOSIS — G9341 Metabolic encephalopathy: Secondary | ICD-10-CM

## 2020-01-06 DIAGNOSIS — K859 Acute pancreatitis without necrosis or infection, unspecified: Secondary | ICD-10-CM | POA: Diagnosis not present

## 2020-01-06 DIAGNOSIS — K7030 Alcoholic cirrhosis of liver without ascites: Secondary | ICD-10-CM | POA: Diagnosis not present

## 2020-01-06 NOTE — Plan of Care (Signed)
  Problem: Education: Goal: Knowledge of General Education information will improve Description: Including pain rating scale, medication(s)/side effects and non-pharmacologic comfort measures Outcome: Progressing   Problem: Health Behavior/Discharge Planning: Goal: Ability to manage health-related needs will improve Outcome: Progressing   Problem: Clinical Measurements: Goal: Ability to maintain clinical measurements within normal limits will improve Outcome: Progressing Goal: Will remain free from infection Outcome: Progressing Goal: Diagnostic test results will improve Outcome: Progressing Goal: Respiratory complications will improve Outcome: Progressing Goal: Cardiovascular complication will be avoided Outcome: Progressing   Problem: Nutrition: Goal: Adequate nutrition will be maintained Outcome: Progressing   Problem: Coping: Goal: Level of anxiety will decrease Outcome: Progressing   Problem: Elimination: Goal: Will not experience complications related to urinary retention Outcome: Progressing   Problem: Safety: Goal: Ability to remain free from injury will improve Outcome: Progressing   Problem: Skin Integrity: Goal: Risk for impaired skin integrity will decrease Outcome: Progressing

## 2020-01-06 NOTE — Progress Notes (Addendum)
PROGRESS NOTE    Adam Marshall  [id number] DOB: Dec 26, 1960 DOA: 12/11/2019 PCP: [name], FNP     Brief Narrative:  Patient was admitted to the hospital with a working diagnosis ofsepticshock due to acute pancreatitis, complicated by hypothermia, respiratory failure, metabolic encephalopathyand acute kidney injury.  58-year-BM PMHx altered mental status. 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 He also has prostate cancer,andalcoholic cirrhosis.  Patient had several days of altered mentation, EMS was called and patient was found hypotensive,on arrival to the ED his temperature was 83.3, blood pressure 89/50, heart rate 49.He was cachectic and ill looking appearance, positive systolic murmur, his lungs had decreased breath sounds bilaterally, his abdomen was tender in the epigastric region and left upper quadrant, no lower extremity edema.Patient was unable to follow commands but no signs of focality.  Patient was placed on broad-spectrum antibiotic therapy and vasopressors. On April 20 he developed respiratory distress, a Shiley 8 tracheostomy tube was inserted and patient was placed on mechanical ventilation. The following day on April 21 she was transitioned back to trach collar. Patient had an NG tube placed for feedings.  Patient required vasopressors while in the intensive care unit.  Transferred to [facility] on 12/27/19.  Patient with refeeding syndrome, aggressive correction of P, Mg, Ca and K.Patient with decreased po intake, continue Ng tube for supportive feedings and for medications.  Patient is allowed to eat, no risk of aspiration, no clinical signs of esophageal tracheal fistula.Diet changed toregularand will attempt to remove NG tube if maintains po intake.  Lurline Idol was removed on 04/26, with good toleration.      Subjective: 5/3 patient appears comfortable    Assessment & Plan:   Principal Problem:   Acute pancreatitis Active Problems:   Chronic pancreatitis (HCC)   Respiratory failure (HCC)   Protein-calorie malnutrition, severe (HCC)   Cancer of hypopharynx (HCC)   Shock ([place])   Acute kidney injury ([place])   Refeeding syndrome   AKI (acute kidney injury) ([place])   Acute metabolic encephalopathy   Alcoholic cirrhosis of liver without ascites ([place])   Severe protein-calorie malnutrition ([place])   Adult failure to thrive   Goals of care, counseling/discussion   Palliative care by specialist   Hypoglycemia   Palliative care encounter   Comfort measures only status   Septic shock/acute EtOH pancreatitis (POC) - abdomenwith nopancreaticabscess (04/21). Completed antibiotic therapy with meropenem.Patient with no significant abdominal pain -Continue to encourage p.o. intake see malnutrition,  -Comfort care  Acute respiratory failure with hypoxia/Neck cancer  -s/p laryngectomy 2017 + neck dissection + XRT . -Trach cannula has been removed with good toleration, will continue trach collar and oxymetry monitoring.  -Comfort care  -EtOH liver cirrhosis No results for input(s): INR in the last 168 hours.] -Liver enzymes all elevated but relatively stable.  We will continue to monitor closely -Albumin= 1.6 -Comfort care  Anemia multifactorial (chronic disease + iron deficiency) -S/p 1 dose IV iron -4/19 transfuse 1 unit PRBC -4/22 transfuse 1 unit PRBC -Negative signs of overt bleeding Recent Labs  Lab 01/02/20 0335 01/03/20 0451 01/03/20 1015 01/04/20 0624 01/05/20 0609  HGB 7.9* 7.9* 8.0* 8.2* 7.3*  -Stable; transfuse for hemoglobin<7 -Comfort care  Thrombocytopenia -4/22 transfuse 1 unit platelet pheresis  AKI Recent Labs  Lab 01/01/20 0602 01/02/20 0335 01/03/20 0451 01/04/20 0624 01/05/20 0609  CREATININE 0.60* 0.66 0.71 0.77 0.86  -Resolved  Penile  lesion and Hx of prostate cancer. -Local wound care.Plan to remove foley catheter today.  Metabolic encephalopathy. -Continue withthiamine and multivitamins. -Patient follows some commands but  waxes and wanes.  -Comfort care  Severe protein calorie malnutrition -Patient currently not consuming enough calories daily to sustain his recovery. -4/29 Per RN Rona Ravens believes patient will do well on dysphagia 1 diet, as he has been eating applesauce and puddings. -4/29 dysphagia 1 diet. -5/1 patient now not consuming any food intake. -5/1 start D10 4ml/hr -Comfort care  Hypomagnesmia -Magnesium goal> 2 -Magnesium IV 3 g    Goals of care -4/28 Palliative Care consult; alcoholic pancreatitis, neck cancer s/p laryngectomy in 2017 and neck dissection s/p radiation therapy, liver cirrhosis due to EtOH abuse, Hx prostate cancer.  Eval for change of CODE STATUS to DNR. -Per palliative care note 4/30 family is waiting to receive patient's living will and will discuss CODE STATUS.  Family is in line with moving patient to facility, see full note.   DVT prophylaxis: SCD Code Status: Full Family Communication: 5/3 spoke with Conrad Sarben niece, and daughter counseled on patient's current status Disposition Plan:  1.  Where the patient is from 2.  Anticipated d/c place. 3.  Barriers to d/c severe protein calorie malnutrition and no current way of providing sufficient calories for recovery.   Consultants:  PCCM   Procedures/Significant Events:    I have personally reviewed and interpreted all radiology studies and my findings are as above.  VENTILATOR SETTINGS:    Cultures   Antimicrobials:    Devices    LINES / TUBES:      Continuous Infusions: . sodium chloride 250 mL (01/03/20 1158)  . HYDROmorphone 1 mg/hr (01/06/20 0556)     Objective: Vitals:   01/06/20 0852 01/06/20 1020 01/06/20 1108 01/06/20 1514  BP:  (!) 78/56    Pulse: (!) 104 (!) 103 (!) 103  (!) 104  Resp: (!) 8 (!) 4 (!) 6 (!) 6  Temp:  98.4 F (36.9 C)    TempSrc:  Oral    SpO2: 90% 91% 91% (!) 88%  Weight:      Height:        Intake/Output Summary (Last 24 hours) at 01/06/2020 1729 Last data filed at 01/06/2020 1300 Gross per 24 hour  Intake 27.55 ml  Output --  Net 27.55 ml   Filed Weights   01/03/20 0444 01/04/20 0109 01/05/20 0400  Weight: 67.4 kg 64.4 kg 63.8 kg   Physical Exam:  General: Sleepy, no acute respiratory distress, cachectic, irritated does not want you to examine him pushes you away Eyes: negative scleral hemorrhage, negative anisocoria, negative icterus ENT: Negative Runny nose, negative gingival bleeding, Neck:  Negative scars, masses, torticollis, lymphadenopathy, JVD,open trach site (trach removed 4/26 ) Lungs: Clear to auscultation bilaterally without wheezes or crackles Cardiovascular: Regular rate and rhythm without murmur gallop or rub normal S1 and S2 Abdomen: negative abdominal pain, nondistended, positive soft, bowel sounds, no rebound, no ascites, no appreciable mass Extremities: No significant cyanosis, clubbing, or edema bilateral lower extremities Skin: Negative rashes, lesions, ulcers Psychiatric: Unable to assess Central nervous system: Unable to assess.     Data Reviewed: Care during the described time interval was provided by me .  I have reviewed this patient's available data, including medical history, events of note, physical examination, and all test results as part of my evaluation.  CBC: Recent Labs  Lab 01/02/20 0335 01/03/20 0451 01/03/20 1015 01/04/20 [id number] 01/05/20  0609  WBC 3.4* 4.4 4.9 6.1 5.6  NEUTROABS 2.5 4.0 4.3 4.2 4.1  HGB 7.9* 7.9* 8.0* 8.2* 7.3*  HCT 23.6* 23.7* 23.3* 22.6* 21.4*  MCV 91.8 92.6 90.3 88.3 91.1  PLT 89* 126* 124* 121* [id number]   Basic Metabolic Panel: Recent Labs  Lab 01/01/20 0602 01/02/20 0335 01/03/20 0451 01/04/20 0624 01/05/20 0609  NA 133* 134* 133* 135 133*  K 4.0 4.3 4.3  4.6 3.9  CL 100 100 100 102 100  CO2 22 24 25 22 23   GLUCOSE 81 74 87 82 77  BUN 21* 19 15 16 12   CREATININE 0.60* 0.66 0.71 0.77 0.86  CALCIUM 9.0 9.1 9.2 9.4 8.8*  MG 1.9 1.7 1.5* 2.0 1.6*  PHOS 4.7* 5.3* 5.8* 6.8* 6.1*   GFR: Estimated Creatinine Clearance: 83.5 mL/min (by C-G formula based on SCr of 0.86 mg/dL). Liver Function Tests: Recent Labs  Lab 01/02/20 0335 01/03/20 0451 01/04/20 0624 01/05/20 0609  AST 102* 126* 113* 93*  ALT 114* 146* 138* 116*  ALKPHOS 514* 615* 652* 739*  BILITOT 1.2 1.4* 1.1 0.9  PROT 4.7* 4.8* 4.8* 4.5*  ALBUMIN 1.6* 1.5* 1.6* 1.5*   No results for input(s): LIPASE, AMYLASE in the last 168 hours. No results for input(s): AMMONIA in the last 168 hours. Coagulation Profile: No results for input(s): INR, PROTIME in the last 168 hours. Cardiac Enzymes: No results for input(s): CKTOTAL, CKMB, CKMBINDEX, TROPONINI in the last 168 hours. BNP (last 3 results) No results for input(s): PROBNP in the last 8760 hours. HbA1C: No results for input(s): HGBA1C in the last 72 hours. CBG: Recent Labs  Lab 01/05/20 0428 01/05/20 0802 01/05/20 0952 01/05/20 1152 01/05/20 1458  GLUCAP 77 70 131* 71 74   Lipid Profile: No results for input(s): CHOL, HDL, LDLCALC, TRIG, CHOLHDL, LDLDIRECT in the last 72 hours. Thyroid Function Tests: No results for input(s): TSH, T4TOTAL, FREET4, T3FREE, THYROIDAB in the last 72 hours. Anemia Panel: No results for input(s): VITAMINB12, FOLATE, FERRITIN, TIBC, IRON, RETICCTPCT in the last 72 hours. Sepsis Labs: No results for input(s): PROCALCITON, LATICACIDVEN in the last 168 hours.  No results found for this or any previous visit (from the past 240 hour(s)).       Radiology Studies: No results found.      Scheduled Meds: . chlorhexidine  15 mL Mouth Rinse BID  . Chlorhexidine Gluconate Cloth  6 each Topical Daily  . glycopyrrolate  0.2 mg Intravenous TID  . LORazepam  0.5 mg Intravenous BID  . mouth  rinse  15 mL Mouth Rinse q12n4p   Continuous Infusions: . sodium chloride 250 mL (01/03/20 1158)  . HYDROmorphone 1 mg/hr (01/06/20 0556)     LOS: 15 days    Time spent:40 min    Zeynab Klett, Geraldo Docker, MD [facility] Pager [phone]  If 7PM-7AM, please contact night-coverage [email or web address] Password [email or web address] 01/06/2020, 5:29 PM

## 2020-01-06 NOTE — Progress Notes (Signed)
Message left for niece and son regarding patient's erratic agonal breathing see they want to be at bedside with patient's passing,he looks like he is getting close as he is comfort care this time. Await response from family.

## 2020-01-06 NOTE — Progress Notes (Signed)
Alexsis niece has arrived reported try get in touch with son let him know about his dad as I have not heard from him.

## 2020-01-06 NOTE — Progress Notes (Signed)
                                                                                                                                                                                                           Daily Progress Note   Patient Name: Adam Marshall       Date: 01/06/2020 DOB: 03-Jul-1961  Age: 59 y.o. MRN#: [id number] Attending Physician: Allie Bossier, MD Primary Care Physician: [name], FNP Admit Date: 12/18/2019  Reason for Consultation/Follow-up: symptom management and terminal care   Subjective: Patient with agonal breathing.  Appears very close to passing.   Discussed with RN.  We both reached out to family to let them know it would be soon.  I was able to speak live with Adam Marshall - but son in [place] did not answer the phone (likely to early).   Assessment: Actively dying    Length of Stay: 15   Vital Signs: BP 96/68   Pulse (!) 104   Temp 98 F (36.7 C) (Oral)   Resp (!) 8   Ht 5\' 6"  (1.676 m)   Wt 63.8 kg   SpO2 90%   BMI 22.70 kg/m  SpO2: SpO2: 90 % O2 Device: O2 Device: Tracheostomy Collar O2 Flow Rate: O2 Flow Rate (L/min): 10 L/min       Palliative Assessment/Data:10%     Palliative Care Plan    Recommendations/Plan:  Continue current care.  Appears comfortable with current regimen.  Code Status:  DNR  Prognosis:   Hours - Days Most likely today.   Discharge Planning:  Anticipated Hospital Death  Care plan was discussed with family, bedside RN.  Thank you for allowing the Palliative Medicine Team to assist in the care of this patient.  Total time spent:  25 min.     Greater than 50%  of this time was spent counseling and coordinating care related to the above assessment and plan.  Florentina Jenny, PA-C Palliative Medicine  Please contact Palliative MedicineTeam phone at [phone] for questions and concerns between 7 am - 7 pm.   Please see AMION for individual provider pager numbers.

## 2020-01-07 DIAGNOSIS — R579 Shock, unspecified: Secondary | ICD-10-CM | POA: Diagnosis not present

## 2020-01-07 DIAGNOSIS — K8522 Alcohol induced acute pancreatitis with infected necrosis: Secondary | ICD-10-CM | POA: Diagnosis not present

## 2020-02-04 NOTE — Progress Notes (Addendum)
Patient passed.  MD notified and came to floor to call death.  Donor services notified.  Additional family came to bedside.  Pt brother present at time of death and notified additional family members.  RN spoke with patient son who stated that niece, Ubaldo Glassing would inform staff of chosen funeral home.    [id number]-Pt family requests [facility].   Patient had no belongings at bedside to send with family.

## 2020-02-04 NOTE — Progress Notes (Signed)
75ml of remaiing IV dialudid drip wasted with Lyn, RN

## 2020-02-04 NOTE — Death Summary Note (Signed)
Death Summary  Adam Marshall CBJ:[id number] DOB: 10-31-1960 DOA: 2019/12/24  PCP: [name], FNP  Admit date: 2019/12/24 Date of Death: 09-Jan-2020 Time of Death: 01:50 Notification: [name], FNP notified of death of 01-09-2020   History of present illness:  Adam Marshall is a 59 y.o. 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  In the emergency department, the patient was found to be hypotensive and hypothermic with acute pancreatitis and acute renal failure with hyperkalemia.  Blood cultures were collected, broad-spectrum antibiotics were initiated, he was fluid resuscitated, and required vasopressors.  Blood cultures remain negative, antibiotics were stopped, and it was felt that his shock was related to acute pancreatitis and hypovolemia.  He developed acute hypoxic respiratory failure on 12/24/2019 and required mechanical ventilation.  He had a worsening pancytopenia that required transfusion of both RBCs and platelets.  Patient was extubated on 12/25/2019 and transferred out of the ICU the following day.  Acute kidney injury resolved but encephalopathy persisted and was suspected to be metabolic in nature.  Palliative care was involved, met with family, and CODE STATUS was changed to DNR on 01/05/2020 and goal of care was changed to comfort only.  Patient was kept comfortable and passed peacefully in the presence of family at 01:50 am on 01-09-20.    Final Diagnoses:  1.   Acute pancreatitis  2.   Shock, hypovolemic  3.   Liver cirrhosis  4.   Thrombocytopenia  5.   Anemia    The results of significant diagnostics from this hospitalization (including imaging, microbiology, ancillary and laboratory) are  listed below for reference.    Significant Diagnostic Studies: CT ABDOMEN PELVIS WO CONTRAST  Result Date: 12/23/2019 CLINICAL DATA:  Sepsis EXAM: CT ABDOMEN AND PELVIS WITHOUT CONTRAST TECHNIQUE: Multidetector CT imaging of the abdomen and pelvis was performed following the standard protocol without IV contrast. COMPARISON:  CT Dec 24, 2019, ultrasound 12-24-2019 FINDINGS: Lower chest: Bullous changes again seen in the right middle lobe. There is increasing volume loss loss in both lower lobes, right slightly greater than left with small bilateral pleural effusions which are new from the comparison exam. Underlying infection/inflammation is not excluded. Hepatobiliary: Redemonstration of the dystrophic calcifications seen along the posterior aspect of the left lobe liver about the falciform ligament and porta hepatis. Stable subcentimeter hypoattenuating foci in the posterior right lobe liver is too small to fully characterize on CT imaging but statistically likely benign such as a small hepatic cyst. Mild gallbladder distention with intermediate attenuation (31 HU). No visible calcified gallstones. No focal pericholecystic inflammation is noted. Difficult to visualize the biliary tree given extensive stranding in the upper abdomen though no gross biliary ductal dilatation is appreciated. Pancreas: Redemonstration of the diffusely edematous appearing pancreas with extensive peripancreatic inflammation. There are enlarging peripancreatic fluid collections particularly extending from the anterior pancreatic body and tail (3/31) as well as a within the pancreaticoduodenal groove, lesser sac, and tracking along the pericolic gutters. Suspect some adjacent thickening of the duodenum and adjacent colonic segments to be largely reactive. 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 Spleen: Normal in size without focal abnormality. Adrenals/Urinary Tract: Mild lobular  thickening of the adrenal glands is similar to priors without concerning dominant nodule. A 1.3 cm fluid attenuation cyst is seen in the interpolar left kidney (6/60). A prominent column of Bertin hand is also noted in the left kidney as well. No concerning renal lesions are visible. No urolithiasis or hydronephrosis. There is some slight interval increase in the amount of now mild-to-moderate bilateral perinephric stranding. The bladder is partially decompressed by inflated Foley catheter. Intraluminal gas may be related to instrumentation. There is mild wall thickening of the urinary bladder. Stomach/Bowel: Distal esophagus is unremarkable. There is edematous thickening towards the level of the gastric antrum as well as diffuse thickening of the first through third portions of the duodenal sweep as it passes in the vicinity of the inflamed pancreas. A normal appendix is visualized. There is diffuse colonic wall thickening most pronounced in the hepatic and splenic flexures. Scattered colonic diverticula without focal pericolonic inflammation to suggest diverticulitis. Vascular/Lymphatic: Atherosclerotic plaque within the normal caliber aorta. Extensive calcification of the branch vessels particularly the ostia of the bilateral renal arteries. Reproductive: Punctate metallic fiducials noted at the level of the prostate. Likely related to brachytherapy. No acute CT abnormality. Some mild edematous changes of the external genitalia. No soft tissue gas is seen. Other: Diffuse edematous changes predominantly centered upon the pancreas with peripancreatic fluid collections extending in the peritoneum and retroperitoneum, as detailed above. Likely some redistribution of fluid with larger pocket seen about the dome of the liver and in the paracolic gutters. Musculoskeletal: Multilevel degenerative changes  are present in the imaged portions of the spine. No acute osseous abnormality or suspicious osseous lesion. IMPRESSION: 1. Redemonstration of the diffusely edematous appearing pancreas with increasing and now extensive peripancreatic inflammation. Evaluation for pancreatic necrosis is limited in the absence of contrast. 2. There are enlarging peripancreatic fluid collections particularly extending from the anterior pancreatic body and tail as well as a within the pancreaticoduodenal groove, lesser sac, and tracking along the pericolic gutters. Suspect these likely reflect acute peripancreatic collections in the absence of demonstrable pancreatic necrosis for the modified Lana criteria. 3. Increasing volume loss in both lower lobes, right slightly greater than left, with small bilateral pleural effusions which are new from the comparison exam. Underlying infection/inflammation is not excluded. 4. Mild gallbladder distention with intermediate attenuation (31 HU). Finding compatible with biliary sludge seen on ultrasound. 5. Diffuse gastric antral, duodenal and colonic wall thickening most pronounced in the hepatic and splenic flexures. Findings are favored to represent reactive inflammation secondary to the adjacent pancreatitis. 6. Colonic diverticulosis without evidence of diverticulitis. 7. Mild edematous changes of the external genitalia are favored to reflect volume third-spacing in the absence of additional focal features of infection. Correlate with visual inspection and exam findings as aggressive/necrotizing soft tissue infection is a clinical diagnosis. 8. Aortic Atherosclerosis (ICD10-I70.0). Electronically Signed   By: Lovena Le M.D.   On: 12/23/2019 03:32   CT ABDOMEN PELVIS WO CONTRAST  Result Date: 12/19/2019 CLINICAL DATA:  Sepsis. History of cirrhosis of the liver, prostate cancer and perform sinus cancer. History of liver cysts, pancreatic cystic lesion and splenic nodules. EXAM: CT ABDOMEN  AND PELVIS WITHOUT CONTRAST TECHNIQUE: Multidetector CT imaging of the abdomen and pelvis was performed following the standard protocol without IV contrast. COMPARISON:  03/14/2016. Abdomen MR dated 04/26/2016. PET-CT dated 02/13/2017. FINDINGS: Lower chest: Stable right middle lobe bullous changes. Mild bibasilar linear atelectasis or scarring. Mild pleural thickening at the right lung base with predominantly linear density in  the adjacent right lower lobe, compatible with scarring. Hepatobiliary: Mildly progressive coarse, dystrophic calcifications at the posterior aspect the left lobe of the liver, straddling the falciform ligament. Stable small right lobe liver cyst. Diffuse medium density material in the gallbladder with no gallbladder wall thickening or pericholecystic fluid seen. Pancreas: The previously demonstrated cystic area in the head of the pancreas is not well visualized due to the lack of intravenous contrast and streak artifacts produced by the patient's arms interval diffuse enlargement of the pancreas with ill-defined margins and mild surrounding edema. The edema extends laterally and inferiorly, adjacent to and posterior to the proximal descending colon. There is diffuse low density wall thickening involving that portion of the colon. Spleen: Normal in size without focal abnormality. Adrenals/Urinary Tract: No significant change in diffuse thickening of both adrenal glands, greater on the left, compatible with adrenal hyperplasia. Interval probable small cyst in the posterior aspect of the mid left kidney, measuring 13 Hounsfield units in density with some volume averaging. Unremarkable right kidney and visualized portions of the ureters. Foley catheter in the urinary bladder with a suggestion of moderate diffuse bladder wall thickening. Stomach/Bowel: Unremarkable stomach and small bowel. Multiple sigmoid and descending colon diverticula. Low density wall thickening involving the proximal  descending colon and splenic flexure adjacent to the peripancreatic edema. No visible small bowel abnormalities. No CT evidence of appendicitis. Vascular/Lymphatic: Atheromatous arterial calcifications without aneurysm. No enlarged lymph nodes. Reproductive: Prostate is unremarkable. Other: No abdominal wall hernia or abnormality. No abdominopelvic ascites. Musculoskeletal: Lumbar spine degenerative changes, most pronounced at the L5-S1 level. Mild scoliosis. IMPRESSION: 1. Interval acute, uncomplicated pancreatitis. 2. Diffuse low density wall thickening involving the proximal descending colon and splenic flexure, compatible with inflammatory changes due to the adjacent pancreatitis. 3. Colonic diverticulosis. 4. Sludge in the gallbladder.  It has. 5. Stable bilateral adrenal hyperplasia. 6. Foley catheter in the urinary bladder with a suggestion of moderate diffuse bladder wall thickening, possibly representing changes due to cystitis. Electronically Signed   By: Claudie Revering M.D.   On: 12/11/2019 15:40   DG Chest 1 View  Result Date: 12/23/2019 CLINICAL DATA:  Respiratory acidosis EXAM: CHEST  1 VIEW COMPARISON:  12/30/2019 FINDINGS: Heart is normal size. No confluent opacities or effusions. No acute bony abnormality. IMPRESSION: No active disease. Electronically Signed   By: Rolm Baptise M.D.   On: 12/23/2019 00:36   CT Head Wo Contrast  Result Date: 12/27/2019 CLINICAL DATA:  Sepsis and encephalopathy. EXAM: CT HEAD WITHOUT CONTRAST TECHNIQUE: Contiguous axial images were obtained from the base of the skull through the vertex without intravenous contrast. COMPARISON:  02/24/2012 FINDINGS: Despite efforts by the technologist and patient, motion artifact is present on today's exam and could not be eliminated. This reduces exam sensitivity and specificity. Brain: Progressive periventricular white matter hypodensity compatible with chronic ischemic microvascular white matter disease. No intracranial  hemorrhage, mass lesion, or acute CVA. Increased prominence of sulci along with increased ventricular prominence which is likely ex vacuo. Vascular: There is atherosclerotic calcification of the cavernous carotid arteries bilaterally. Skull: Unremarkable Sinuses/Orbits: Unremarkable Other: No supplemental non-categorized findings. IMPRESSION: 1. No acute intracranial findings. 2. Progressive periventricular white matter hypodensity compatible with chronic ischemic microvascular white matter disease. 3. Increased prominence of sulci along with increased ventricular prominence which is likely ex vacuo. Electronically Signed   By: Van Clines M.D.   On: 12/30/2019 15:30   CT ABDOMEN PELVIS W CONTRAST  Result Date: 12/25/2019 CLINICAL DATA:  Necrotizing pancreatitis EXAM:  CT ABDOMEN AND PELVIS WITH CONTRAST TECHNIQUE: Multidetector CT imaging of the abdomen and pelvis was performed using the standard protocol following bolus administration of intravenous contrast. CONTRAST:  157m OMNIPAQUE IOHEXOL 300 MG/ML  SOLN COMPARISON:  Noncontrast CT yesterday. This is the patient's third abdominopelvic CT in 3 days. Abdominal MRI 04/26/2016 FINDINGS: Lower chest: Small to moderate bilateral pleural effusions, increased from yesterday. Adjacent compressive atelectasis. Emphysema scarring in the right middle lobe. Hepatobiliary: Nodular hepatic contours consistent with cirrhosis. Scattered tiny hepatic hypodensities that are nonspecific, may represent cysts or biliary hamartomas. Dystrophic calcification adjacent the falciform ligament is unchanged. No calcified gallstone. No biliary dilatation. Pancreas: Mild diffuse peripancreatic fat stranding, similar to prior. No evidence of abnormal enhancement or pancreatic necrosis. Cystic structure in the pancreatic head measuring 17 x 15 x 17 mm better defined on the current exam given IV contrast. Mild pancreatic ductal dilatation at 4 mm. There is no other acute  peripancreatic collection. Previously question peripancreatic fluid collections likely represent generalized ascites. Spleen: Normal in size without focal abnormality. The splenic vein is patent. Adrenals/Urinary Tract: Nodular adrenal thickening, more prominent on the left. No hydronephrosis. No significant perinephric edema. 8 mm cyst in the posterior left kidney. There is absent renal excretion on delayed phase imaging. Urinary bladder is decompressed by Foley catheter. Stomach/Bowel: Enteric tube in place with tip in the proximal jejunum. Small amount of fluid and contrast in the stomach. Areas of gastric wall thickening about the body likely reactive. There scattered areas of duodenal wall thickening, also likely reactive. Slightly edematous appearance of small bowel in the left upper quadrant. Administered enteric contrast reaches the colon. Colon appears fluid-filled with equivocal wall thickening involving the splenic flexure through the sigmoid. Distal colonic diverticulosis without focal diverticulitis. Rectal tube in place. Vascular/Lymphatic: Advanced aortic and branch atherosclerosis. No aortic aneurysm. Dense calcification at the origin of the renal arteries. The portal and splenic veins are patent. Mesenteric vessels are patent. Evaluation for adenopathy is limited given paucity of intra-abdominal fat and ascites, no bulky abdominopelvic lymph nodes. Reproductive: Fiducial markers in the prostate. Other: Small to moderate volume abdominopelvic ascites, increased from CT yesterday. There is no free air. Other than the cystic structure in the pancreatic head, no focal fluid collection. Mild generalized body wall edema. Musculoskeletal: Multilevel degenerative change in the lumbar spine. Bones appear under mineralized. No acute osseous abnormalities are seen. IMPRESSION: 1. Acute edematous pancreatitis. No evidence of pancreatic necrosis. 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 This is slightly decreased in size from 2017 MRI. 2. No acute peripancreatic fluid collection. Free fluid adjacent to the pancreas related to ascites but no focal fluid collection. 3. Edematous appearing distal stomach, scattered small bowel, and left colon, likely reactive, but nonspecific. 4. Small to moderate volume abdominopelvic ascites, increased from CT yesterday. Small to moderate bilateral pleural effusions also increased from yesterday. 5. Hepatic cirrhosis. Innumerable small low-density lesions within the liver are too small to characterize, may represent cysts or biliary hamartomas. This was also seen on 04/26/2016 abdominal MRI. 6. Absent renal excretion on delayed phase imaging, suggesting underlying renal dysfunction. 7. Advanced aortic atherosclerosis. Aortic Atherosclerosis (ICD10-I70.0). Electronically Signed   By: MKeith RakeM.D.   On: 12/25/2019 00:19   DG CHEST PORT 1 VIEW  Result Date: 12/24/2019 CLINICAL DATA:  59year old male with respiratory failure. EXAM: PORTABLE CHEST 1 VIEW COMPARISON:  Chest radiograph dated 12/23/2019. FINDINGS:  Tracheostomy with tip approximately 4 cm above the carina. Right subclavian central venous line with tip over central SVC close to the cavoatrial junction. Feeding tube extends below the diaphragm with tip beyond the inferior margin of the image. Small bilateral pleural effusions with bibasilar atelectasis or infiltrate, new since the prior radiograph. No pneumothorax. Stable cardiomediastinal silhouette. Atherosclerotic calcification of the aorta. No acute osseous pathology. IMPRESSION: Small bilateral pleural effusions with bibasilar atelectasis or infiltrate, new since the prior radiograph. Electronically Signed   By: Anner Crete M.D.   On: 12/24/2019 18:35   DG Chest Port 1 View  Result Date: 12/23/2019 CLINICAL DATA:  Hypotension, sepsis. EXAM: PORTABLE CHEST 1 VIEW  COMPARISON:  Chest x-ray dated 08/04/2004 and chest x-ray dated 03/14/2016. FINDINGS: Heart size and mediastinal contours are within normal limits. Lungs are clear. No pleural effusion is seen. Osseous structures about the chest are unremarkable. IMPRESSION: No active disease. No evidence of pneumonia or pulmonary edema. Electronically Signed   By: Franki Cabot M.D.   On: 12/05/2019 13:20   DG Abd Portable 1V  Result Date: 12/23/2019 CLINICAL DATA:  Encounter for feeding tube placement. EXAM: PORTABLE ABDOMEN - 1 VIEW COMPARISON:  Radiograph dated 12/17/2019 FINDINGS: There is a feeding tube with the tip in the proximal jejunum, likely just beyond the ligament of Treitz. Bowel gas pattern is normal. The amount of bowel gas has substantially decreased since the prior study. No bone abnormality. IMPRESSION: Feeding tube tip is in the proximal jejunum, likely just beyond the ligament of Treitz. Electronically Signed   By: Lorriane Shire M.D.   On: 12/23/2019 16:59   DG Abd Portable 1 View  Result Date: 12/23/2019 CLINICAL DATA:  Vomiting, sepsis. EXAM: PORTABLE ABDOMEN - 1 VIEW COMPARISON:  None. FINDINGS: No dilated large or small bowel loops are identified. Patulous small-bowel loops are seen within the upper abdomen. No evidence of free intraperitoneal air. No evidence of abnormal fluid collection. No evidence of renal or ureteral calculi. Osseous structures are unremarkable. IMPRESSION: 1. Nonobstructive bowel gas pattern. 2. Patulous loops of small bowel in the upper abdomen raising the possibility of small bowel enteritis. Electronically Signed   By: Franki Cabot M.D.   On: 12/16/2019 13:22   US Abdomen Limited RUQ  Result Date: 12/21/2019 CLINICAL DATA:  Pancreatitis EXAM: ULTRASOUND ABDOMEN LIMITED RIGHT UPPER QUADRANT COMPARISON:  CT abdomen pelvis 01/03/2020 FINDINGS: Gallbladder: Intermediate echogenicity sludge present within the gallbladder. Unable to decubitus patient to assess mobility.  Gallbladder wall thickness is at the upper limits of normal. Sonographic Murphy sign was unable to be assessed due to patient mental status. Common bile duct: Diameter: 7 mm, borderline dilated. Liver: No focal lesion identified. Diffusely increased hepatic echogenicity with loss of definition of the portal triads and diminished posterior through transmission compatible with hepatic steatosis. Portal vein is patent on color Doppler imaging with normal direction of blood flow towards the liver. Other: Incidental note made of an edematous appearing pancreas with extensive peripancreatic fluid. IMPRESSION: Features are suggestive of acute cholecystitis in the appropriate clinical setting though a sonographic Murphy sign was unable to be fully assessed due to an unresponsive patient. Mild dilatation of the extrahepatic common bile duct, no visible intraductal stones. If there is concern for choledocholithiasis, MRCP could be obtained. Diffusely increased hepatic echogenicity, most often seen with hepatic steatosis. Edematous pancreas with surrounding peripancreatic fluid compatible with features of pancreatitis seen on the same day CT. Electronically Signed   By: Elwin Sleight.D.  On: 01/03/2020 19:22    Microbiology: No results found for this or any previous visit (from the past 240 hour(s)).   Labs: Basic Metabolic Panel: Recent Labs  Lab 01/01/20 0602 01/01/20 0602 01/02/20 0335 01/02/20 0335 01/03/20 0451 01/03/20 0451 01/04/20 0624 01/05/20 0609  NA 133*  --  134*  --  133*  --  135 133*  K 4.0   < > 4.3   < > 4.3   < > 4.6 3.9  CL 100  --  100  --  100  --  102 100  CO2 22  --  24  --  25  --  22 23  GLUCOSE 81  --  74  --  87  --  82 77  BUN 21*  --  19  --  15  --  16 12  CREATININE 0.60*  --  0.66  --  0.71  --  0.77 0.86  CALCIUM 9.0  --  9.1  --  9.2  --  9.4 8.8*  MG 1.9  --  1.7  --  1.5*  --  2.0 1.6*  PHOS 4.7*  --  5.3*  --  5.8*  --  6.8* 6.1*   < > = values in this  interval not displayed.   Liver Function Tests: Recent Labs  Lab 01/02/20 0335 01/03/20 0451 01/04/20 0624 01/05/20 0609  AST 102* 126* 113* 93*  ALT 114* 146* 138* 116*  ALKPHOS 514* 615* 652* 739*  BILITOT 1.2 1.4* 1.1 0.9  PROT 4.7* 4.8* 4.8* 4.5*  ALBUMIN 1.6* 1.5* 1.6* 1.5*   No results for input(s): LIPASE, AMYLASE in the last 168 hours. No results for input(s): AMMONIA in the last 168 hours. CBC: Recent Labs  Lab 01/02/20 0335 01/03/20 0451 01/03/20 1015 01/04/20 0624 01/05/20 0609  WBC 3.4* 4.4 4.9 6.1 5.6  NEUTROABS 2.5 4.0 4.3 4.2 4.1  HGB 7.9* 7.9* 8.0* 8.2* 7.3*  HCT 23.6* 23.7* 23.3* 22.6* 21.4*  MCV 91.8 92.6 90.3 88.3 91.1  PLT 89* 126* 124* 121* 151   Cardiac Enzymes: No results for input(s): CKTOTAL, CKMB, CKMBINDEX, TROPONINI in the last 168 hours. D-Dimer No results for input(s): DDIMER in the last 72 hours. BNP: Invalid input(s): POCBNP CBG: Recent Labs  Lab 01/05/20 0428 01/05/20 0802 01/05/20 0952 01/05/20 1152 01/05/20 1458  GLUCAP 77 70 131* 71 74   Anemia work up No results for input(s): VITAMINB12, FOLATE, FERRITIN, TIBC, IRON, RETICCTPCT in the last 72 hours. Urinalysis    Component Value Date/Time   COLORURINE AMBER (A) 01/01/2020 1847   APPEARANCEUR CLOUDY (A) 01/02/2020 1847   LABSPEC 1.015 12/13/2019 1847   LABSPEC 1.030 11/07/2016 1623   PHURINE 5.0 12/26/2019 1847   GLUCOSEU 50 (A) 12/21/2019 1847   GLUCOSEU Negative 11/07/2016 1623   HGBUR LARGE (A) 01/01/2020 1847   BILIRUBINUR NEGATIVE 12/14/2019 1847   BILIRUBINUR Negative 11/07/2016 1623   KETONESUR NEGATIVE 12/11/2019 1847   PROTEINUR 100 (A) 12/11/2019 1847   UROBILINOGEN 0.2 11/07/2016 1623   NITRITE NEGATIVE 12/26/2019 1847   LEUKOCYTESUR MODERATE (A) 12/25/2019 1847   LEUKOCYTESUR Small 11/07/2016 1623   Sepsis Labs Invalid input(s): PROCALCITONIN,  WBC,  LACTICIDVEN     SIGNED:  Vianne Bulls, MD  [facility] Jan 16, 2020, 2:00 AM Pager     If 7PM-7AM, please contact night-coverage [email or web address] Password [email or web address]

## 2020-02-04 DEATH — deceased
